# Patient Record
Sex: Male | Born: 1955
Health system: Southern US, Community
[De-identification: ages and names within clinical notes are randomized; demographics above are authoritative.]

## PROBLEM LIST (undated history)

## (undated) DIAGNOSIS — M5410 Radiculopathy, site unspecified: Secondary | ICD-10-CM

## (undated) DIAGNOSIS — E876 Hypokalemia: Secondary | ICD-10-CM

## (undated) DIAGNOSIS — I2510 Atherosclerotic heart disease of native coronary artery without angina pectoris: Secondary | ICD-10-CM

## (undated) DIAGNOSIS — F419 Anxiety disorder, unspecified: Secondary | ICD-10-CM

## (undated) DIAGNOSIS — I499 Cardiac arrhythmia, unspecified: Secondary | ICD-10-CM

## (undated) DIAGNOSIS — I10 Essential (primary) hypertension: Secondary | ICD-10-CM

## (undated) DIAGNOSIS — D333 Benign neoplasm of cranial nerves: Secondary | ICD-10-CM

## (undated) DIAGNOSIS — E7800 Pure hypercholesterolemia, unspecified: Secondary | ICD-10-CM

## (undated) DIAGNOSIS — F22 Delusional disorders: Secondary | ICD-10-CM

## (undated) DIAGNOSIS — M1990 Unspecified osteoarthritis, unspecified site: Secondary | ICD-10-CM

## (undated) DIAGNOSIS — G20A1 Parkinson's disease without dyskinesia, without mention of fluctuations: Secondary | ICD-10-CM

## (undated) DIAGNOSIS — I480 Paroxysmal atrial fibrillation: Secondary | ICD-10-CM

## (undated) DIAGNOSIS — E538 Deficiency of other specified B group vitamins: Secondary | ICD-10-CM

## (undated) DIAGNOSIS — H9192 Unspecified hearing loss, left ear: Secondary | ICD-10-CM

## (undated) DIAGNOSIS — Z978 Presence of other specified devices: Secondary | ICD-10-CM

## (undated) DIAGNOSIS — T8859XA Other complications of anesthesia, initial encounter: Secondary | ICD-10-CM

## (undated) DIAGNOSIS — R251 Tremor, unspecified: Secondary | ICD-10-CM

## (undated) DIAGNOSIS — G4700 Insomnia, unspecified: Secondary | ICD-10-CM

## (undated) DIAGNOSIS — Z9889 Other specified postprocedural states: Secondary | ICD-10-CM

## (undated) HISTORY — DX: Unspecified hearing loss, left ear: H91.92

## (undated) HISTORY — PX: ACOUSTIC NEUROMA RESECTION: SHX5713

## (undated) HISTORY — DX: Paroxysmal atrial fibrillation: I48.0

## (undated) HISTORY — DX: Pure hypercholesterolemia, unspecified: E78.00

## (undated) HISTORY — DX: Hypokalemia: E87.6

## (undated) HISTORY — DX: Essential (primary) hypertension: I10

---
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
## 2013-05-06 ENCOUNTER — Encounter: Payer: Self-pay | Admitting: Cardiovascular Disease

## 2013-05-07 ENCOUNTER — Encounter (INDEPENDENT_AMBULATORY_CARE_PROVIDER_SITE_OTHER): Payer: Self-pay

## 2013-05-07 ENCOUNTER — Encounter: Payer: Self-pay | Admitting: Cardiovascular Disease

## 2013-05-07 ENCOUNTER — Encounter: Payer: Self-pay | Admitting: *Deleted

## 2013-05-07 ENCOUNTER — Other Ambulatory Visit: Payer: Self-pay | Admitting: *Deleted

## 2013-05-07 ENCOUNTER — Ambulatory Visit (INDEPENDENT_AMBULATORY_CARE_PROVIDER_SITE_OTHER): Payer: BC Managed Care – PPO | Admitting: Cardiovascular Disease

## 2013-05-07 VITALS — BP 160/80 | HR 85 | Ht 71.0 in | Wt 222.1 lb

## 2013-05-07 DIAGNOSIS — I10 Essential (primary) hypertension: Secondary | ICD-10-CM

## 2013-05-07 DIAGNOSIS — E876 Hypokalemia: Secondary | ICD-10-CM | POA: Insufficient documentation

## 2013-05-07 DIAGNOSIS — E7800 Pure hypercholesterolemia, unspecified: Secondary | ICD-10-CM | POA: Insufficient documentation

## 2013-05-07 DIAGNOSIS — H9192 Unspecified hearing loss, left ear: Secondary | ICD-10-CM | POA: Insufficient documentation

## 2013-05-07 DIAGNOSIS — I4891 Unspecified atrial fibrillation: Secondary | ICD-10-CM | POA: Insufficient documentation

## 2013-05-07 DIAGNOSIS — Z79899 Other long term (current) drug therapy: Secondary | ICD-10-CM

## 2013-05-07 LAB — BASIC METABOLIC PANEL
BUN: 17 mg/dL (ref 6–23)
CO2: 29 mEq/L (ref 19–32)
Calcium: 9.2 mg/dL (ref 8.4–10.5)
Chloride: 101 mEq/L (ref 96–112)
Creatinine, Ser: 1 mg/dL (ref 0.4–1.5)
GFR: 84.61 mL/min (ref >60.00)
Glucose, Bld: 118 mg/dL — ABNORMAL HIGH (ref 70–99)
Potassium: 3.1 mEq/L — ABNORMAL LOW (ref 3.5–5.1)
Sodium: 138 mEq/L (ref 135–145)

## 2013-05-07 LAB — CBC WITH DIFFERENTIAL/PLATELET
Basophils Absolute: 0 10*3/uL (ref 0.0–0.1)
Basophils Relative: 0.3 % (ref 0.0–3.0)
Eosinophils Absolute: 0.2 10*3/uL (ref 0.0–0.7)
Eosinophils Relative: 3.2 % (ref 0.0–5.0)
HCT: 47 % (ref 39.0–52.0)
Hemoglobin: 16.3 g/dL (ref 13.0–17.0)
Lymphocytes Relative: 42.4 % (ref 12.0–46.0)
Lymphs Abs: 2.2 10*3/uL (ref 0.7–4.0)
MCHC: 34.7 g/dL (ref 30.0–36.0)
MCV: 90.8 fl (ref 78.0–100.0)
Monocytes Absolute: 0.6 10*3/uL (ref 0.1–1.0)
Monocytes Relative: 11 % (ref 3.0–12.0)
Neutro Abs: 2.3 10*3/uL (ref 1.4–7.7)
Neutrophils Relative %: 43.1 % (ref 43.0–77.0)
Platelets: 149 10*3/uL — ABNORMAL LOW (ref 150.0–400.0)
RBC: 5.18 Mil/uL (ref 4.22–5.81)
RDW: 14.4 % (ref 11.5–14.6)
WBC: 5.3 10*3/uL (ref 4.5–10.5)

## 2013-05-07 MED ORDER — APIXABAN 5 MG PO TABS: 5.0000 mg | ORAL_TABLET | Freq: Two times a day (BID) | ORAL | Status: DC

## 2013-05-07 NOTE — Assessment & Plan Note (Signed)
Long discussion with patient and wife regarding diagnosis, natural history and Rx including anticoagulation, rate control and cardioversion  Start Eliquis 5 bid  Check CBC and BMET  Echo to assess chamber sizes  Continue current beta blocker.  F/U 3-4 weeks to discuss [facility]

## 2013-05-07 NOTE — Assessment & Plan Note (Signed)
Cholesterol is at goal.  Continue current dose of statin and diet Rx.  No myalgias or side effects.  F/U  LFT's in 6 months. No results found for this basename: [place]   Labs with [facility] Primary

## 2013-05-07 NOTE — Progress Notes (Signed)
Patient ID: Patrick Gray, male   DOB: 04-06-1955, 58 y.o.   MRN: [id number]   58 yo history of HTN  Found to be in afib by primary  Noted irregular pulse for a couple of months but ECG not done. Patient really does not know he is in it.  Chronic Rx for HTN CHADVASC score only 1.  Has an older brother who has afib and required ablation.  No bleeding issues or contraindications to blood thinner.  Denies chest pain, dyspnea Mild fatigue No syncope.  BP lately poorly controlled Norvasc started yesterday.  Denies excess ETOH        ROS: Denies fever, malais, weight loss, blurry vision, decreased visual acuity, cough, sputum, SOB, hemoptysis, pleuritic pain, palpitaitons, heartburn, abdominal pain, melena, lower extremity edema, claudication, or rash.  All other systems reviewed and negative   General: Affect appropriate Healthy:  appears stated age 13: normal Neck supple with no adenopathy JVP normal no bruits no thyromegaly Lungs clear with no wheezing and good diaphragmatic motion Heart:  S1/S2 no murmur,rub, gallop or click PMI normal Abdomen: benighn, BS positve, no tenderness, no AAA no bruit.  No HSM or HJR Distal pulses intact with no bruits No edema Neuro non-focal Skin warm and dry No muscular weakness  Medications Current Outpatient Prescriptions  Medication Sig Dispense Refill  . amLODipine (NORVASC) 5 MG tablet Take 5 mg by mouth daily.       Marland Kitchen apixaban (ELIQUIS) 5 MG TABS tablet Take 1 tablet (5 mg total) by mouth 2 (two) times daily.  60 tablet  6  . KLOR-CON M20 20 MEQ tablet Take 20 mEq by mouth once.       Marland Kitchen lisinopril-hydrochlorothiazide (PRINZIDE,ZESTORETIC) 20-25 MG per tablet Take 1 tablet by mouth daily.       . metoprolol (LOPRESSOR) 50 MG tablet Take 50 mg by mouth 2 (two) times daily.       . simvastatin (ZOCOR) 40 MG tablet Take 40 mg by mouth daily.        No current facility-administered medications for this visit.     Allergies Penicillins  Family History: No family history on file.  Social History: History   Social History  . Marital Status: Married    Spouse Name: N/A    Number of Children: N/A  . Years of Education: N/A   Occupational History  . Not on file.   Social History Main Topics  . Smoking status: Former Research scientist (life sciences)  . Smokeless tobacco: Not on file  . Alcohol Use: Yes     Comment: Weekly, Beer 2+  . Drug Use: Not on file  . Sexual Activity: Not on file   Other Topics Concern  . Not on file   Social History Narrative  . No narrative on file    Electrocardiogram:  afib nonspecific ST/T wave changes  LAD  QT 392  No change from 03/28/13 at [facility] office   Assessment and Plan

## 2013-05-07 NOTE — Assessment & Plan Note (Signed)
Continue current Rx f/u primary Can increase norvasc if needed or change to cardizem since he has afib

## 2013-05-07 NOTE — Patient Instructions (Signed)
Your physician recommends that you schedule a follow-up appointment in:   [place] has recommended you make the following change in your medication: ADD ELIQUIS  5 MG  1 TAB   TWICE  DAILY Your physician has requested that you have an echocardiogram. Echocardiography is a painless test that uses sound waves to create images of your heart. It provides your doctor with information about the size and shape of your heart and how well your heart's chambers and valves are working. This procedure takes approximately one hour. There are no restrictions for this procedure. Your physician recommends that you return for lab work in:  [place]

## 2013-05-08 ENCOUNTER — Telehealth: Payer: Self-pay | Admitting: *Deleted

## 2013-05-08 NOTE — Telephone Encounter (Signed)
Message copied by Verna Czech on Thu May 08, 2013  3:22 PM ------      Message from: Josue Hector      Created: Wed May 07, 2013  3:56 PM       K is low take another Kdur tablet / day and f/u BMET in 4 weeks ------

## 2013-05-08 NOTE — Telephone Encounter (Signed)
Keep takng 40 /day and f/u bmet in 4 weeks

## 2013-05-08 NOTE — Telephone Encounter (Signed)
Pt returns call & states that he is taking 40 meq of potassium daily already for about a month.  I will forward to Dr. Johnsie Cancel for further review Horton Chin RN

## 2013-05-09 NOTE — Telephone Encounter (Signed)
Pt is aware & will stay on current dosage of 62meq potassium daily Pt states he did have his potassium checked twice already in the past month with pcp at [facility].  2/16  K+ 3.4  3/17  K+  3.5  He has an appt 06/04/13 with Dr. Johnsie Cancel & will have bmp drawn after visit. Horton Chin RN

## 2013-05-15 ENCOUNTER — Telehealth: Payer: Self-pay | Admitting: Cardiovascular Disease

## 2013-05-15 ENCOUNTER — Other Ambulatory Visit: Payer: Self-pay | Admitting: *Deleted

## 2013-05-15 ENCOUNTER — Ambulatory Visit (HOSPITAL_COMMUNITY): Payer: BC Managed Care – PPO | Attending: Internal Medicine | Admitting: Radiology

## 2013-05-15 DIAGNOSIS — E876 Hypokalemia: Secondary | ICD-10-CM

## 2013-05-15 DIAGNOSIS — I4891 Unspecified atrial fibrillation: Secondary | ICD-10-CM | POA: Insufficient documentation

## 2013-05-15 NOTE — Telephone Encounter (Signed)
New message ° ° °Patient returning call back to nurse.  °

## 2013-05-15 NOTE — Progress Notes (Signed)
Echocardiogram performed.  

## 2013-05-15 NOTE — Telephone Encounter (Signed)
PT AWARE OF LAB RESULTS./CY 

## 2013-05-19 ENCOUNTER — Telehealth: Payer: Self-pay | Admitting: *Deleted

## 2013-05-19 NOTE — Telephone Encounter (Signed)
         Beckie Salts ','<More Detail >>       Josue Hector, MD       Sent: Fri May 16, 2013 11:11 PM    To: Richmond Campbell, LPN                   Message     Just mild MR otherwise echo normal        ----- Message -----    From: Richmond Campbell, LPN    Sent: 10/24/8331 2:18 PM    To: Josue Hector, MD                                  PT  AWARE OF  ECHO RESULT S./ Adonis Housekeeper

## 2013-05-30 ENCOUNTER — Telehealth: Payer: Self-pay | Admitting: *Deleted

## 2013-05-30 NOTE — Telephone Encounter (Signed)
Normal EF just mild MR echo ok ----- Message ----- From: Baxter Flattery Sent: 05/29/2013 3:01 PM To: Josue Hector, MD Echo Report  PT  AWARE  OF  ECHO RESULTS./CY

## 2013-06-04 ENCOUNTER — Encounter: Payer: Self-pay | Admitting: Cardiovascular Disease

## 2013-06-04 ENCOUNTER — Encounter: Payer: Self-pay | Admitting: Cardiology

## 2013-06-04 ENCOUNTER — Other Ambulatory Visit: Payer: BC Managed Care – PPO

## 2013-06-04 ENCOUNTER — Ambulatory Visit (INDEPENDENT_AMBULATORY_CARE_PROVIDER_SITE_OTHER): Payer: BC Managed Care – PPO | Admitting: Cardiovascular Disease

## 2013-06-04 VITALS — BP 142/102 | HR 98 | Ht 71.0 in | Wt 222.4 lb

## 2013-06-04 DIAGNOSIS — E7800 Pure hypercholesterolemia, unspecified: Secondary | ICD-10-CM

## 2013-06-04 DIAGNOSIS — E876 Hypokalemia: Secondary | ICD-10-CM

## 2013-06-04 DIAGNOSIS — I10 Essential (primary) hypertension: Secondary | ICD-10-CM

## 2013-06-04 DIAGNOSIS — I4891 Unspecified atrial fibrillation: Secondary | ICD-10-CM

## 2013-06-04 LAB — BASIC METABOLIC PANEL
BUN: 13 mg/dL (ref 6–23)
CO2: 32 mEq/L (ref 19–32)
Calcium: 9.5 mg/dL (ref 8.4–10.5)
Chloride: 102 mEq/L (ref 96–112)
Creatinine, Ser: 0.9 mg/dL (ref 0.4–1.5)
GFR: 98.51 mL/min (ref >60.00)
Glucose, Bld: 97 mg/dL (ref 70–99)
Potassium: 3.5 mEq/L (ref 3.5–5.1)
Sodium: 140 mEq/L (ref 135–145)

## 2013-06-04 NOTE — Assessment & Plan Note (Signed)
On eliquis since 3/18  No missed doses No bleeding issues.  [place] in 2 weeks  Scheduled at [facility] 8:00 am 4/28 Orders written

## 2013-06-04 NOTE — Patient Instructions (Signed)
Your physician has recommended that you have a Cardioversion (DCCV). Electrical Cardioversion uses a jolt of electricity to your heart either through paddles or wired patches attached to your chest. This is a controlled, usually prescheduled, procedure. Defibrillation is done under light anesthesia in the hospital, and you usually go home the day of the procedure. This is done to get your heart back into a normal rhythm. You are not awake for the procedure. Please see the instruction sheet given to you today.  Your physician recommends that you schedule a follow-up appointment in: 3-4 weeks after Cardioversion (CV scheduled for 06/17/13)

## 2013-06-04 NOTE — Assessment & Plan Note (Signed)
Home BP readings are in normal range He would like to stop calcium blocker post [facility] as it makes him lethargic

## 2013-06-04 NOTE — Assessment & Plan Note (Signed)
Cholesterol is at goal.  Continue current dose of statin and diet Rx.  No myalgias or side effects.  F/U  LFT's in 6 months. No results found for this basename: LDLCALC             

## 2013-06-04 NOTE — Progress Notes (Signed)
Patient ID: Patrick Gray, male   DOB: 07/24/1955, 57 y.o.   MRN: [id number] 57 yo history of HTN Found to be in afib 3/18  by primary Noted irregular pulse for a couple of months but ECG not done. Patient really does not know he is in it. Chronic Rx for HTN  CHADVASC score only 1. Has an older brother who has afib and required ablation. No bleeding issues or contraindications to blood thinner. Denies chest pain, dyspnea Mild fatigue  No syncope. BP lately poorly controlled Norvasc started yesterday. Denies excess ETOH Started on Eliquis  F/U Echo   3/26 Study Conclusions  - Left ventricle: The cavity size was normal. Wall thickness was increased in a pattern of mild LVH. Systolic function was normal. The estimated ejection fraction was in the range of 55% to 60%. - Mitral valve: Mild regurgitation. Normla LA size  He is ready for DCC on 4/28 BMET collected today and CBC ok 3/18 (PLT 149 lower limit normal)  Risks and procedural details discussed    ROS: Denies fever, malais, weight loss, blurry vision, decreased visual acuity, cough, sputum, SOB, hemoptysis, pleuritic pain, palpitaitons, heartburn, abdominal pain, melena, lower extremity edema, claudication, or rash.  All other systems reviewed and negative  General: Affect appropriate Healthy:  appears stated age HEENT: normal Neck supple with no adenopathy JVP normal no bruits no thyromegaly Lungs clear with no wheezing and good diaphragmatic motion Heart:  S1/S2 no murmur, no rub, gallop or click PMI normal Abdomen: benighn, BS positve, no tenderness, no AAA no bruit.  No HSM or HJR Distal pulses intact with no bruits No edema Neuro non-focal Skin warm and dry No muscular weakness   Current Outpatient Prescriptions  Medication Sig Dispense Refill  . amLODipine (NORVASC) 5 MG tablet Take 5 mg by mouth daily.       . apixaban (ELIQUIS) 5 MG TABS tablet Take 1 tablet (5 mg total) by mouth 2 (two) times daily.  60 tablet  6  .  KLOR-CON M20 20 MEQ tablet Take 20 mEq by mouth 2 (two) times daily.       . lisinopril-hydrochlorothiazide (PRINZIDE,ZESTORETIC) 20-25 MG per tablet Take 1 tablet by mouth daily.       . metoprolol (LOPRESSOR) 50 MG tablet Take 50 mg by mouth 2 (two) times daily.       . simvastatin (ZOCOR) 40 MG tablet Take 40 mg by mouth daily.        No current facility-administered medications for this visit.    Allergies  Penicillins  Electrocardiogram:  3/18  afib rate 78  otherwwise normal   Assessment and Plan   

## 2013-06-05 NOTE — Progress Notes (Signed)
Quick Note:  Preliminary report reviewed by triage nurse and sent to MD desk. ______ 

## 2013-06-09 ENCOUNTER — Other Ambulatory Visit: Payer: BC Managed Care – PPO

## 2013-06-09 ENCOUNTER — Other Ambulatory Visit: Payer: Self-pay | Admitting: Cardiovascular Disease

## 2013-06-17 ENCOUNTER — Encounter (HOSPITAL_COMMUNITY): Payer: Self-pay

## 2013-06-17 ENCOUNTER — Ambulatory Visit (HOSPITAL_COMMUNITY)
Admission: RE | Admit: 2013-06-17 | Discharge: 2013-06-17 | Disposition: A | Discharge status: Home / Self Care | Payer: BC Managed Care – PPO | Source: Ambulatory Visit | Attending: Cardiovascular Disease | Admitting: Cardiovascular Disease

## 2013-06-17 ENCOUNTER — Ambulatory Visit (HOSPITAL_COMMUNITY): Payer: BC Managed Care – PPO | Admitting: Anesthesiology

## 2013-06-17 ENCOUNTER — Encounter (HOSPITAL_COMMUNITY): Payer: BC Managed Care – PPO | Admitting: Anesthesiology

## 2013-06-17 ENCOUNTER — Encounter (HOSPITAL_COMMUNITY)
Admission: RE | Disposition: A | Discharge status: Home / Self Care | Payer: Self-pay | Source: Ambulatory Visit | Attending: Cardiovascular Disease

## 2013-06-17 DIAGNOSIS — I059 Rheumatic mitral valve disease, unspecified: Secondary | ICD-10-CM | POA: Insufficient documentation

## 2013-06-17 DIAGNOSIS — I4891 Unspecified atrial fibrillation: Secondary | ICD-10-CM

## 2013-06-17 DIAGNOSIS — Z7901 Long term (current) use of anticoagulants: Secondary | ICD-10-CM | POA: Insufficient documentation

## 2013-06-17 DIAGNOSIS — I10 Essential (primary) hypertension: Secondary | ICD-10-CM | POA: Insufficient documentation

## 2013-06-17 DIAGNOSIS — Z87891 Personal history of nicotine dependence: Secondary | ICD-10-CM | POA: Insufficient documentation

## 2013-06-17 HISTORY — PX: CARDIOVERSION: SHX1299

## 2013-06-17 SURGERY — CARDIOVERSION
Anesthesia: Monitor Anesthesia Care

## 2013-06-17 MED ORDER — LIDOCAINE HCL (CARDIAC) 20 MG/ML IV SOLN
INTRAVENOUS | Status: DC | PRN
  Administered 2013-06-17: 40 mg via INTRAVENOUS

## 2013-06-17 MED ORDER — METOPROLOL TARTRATE 1 MG/ML IV SOLN
5.0000 mg | Freq: Once | INTRAVENOUS | Status: AC
  Administered 2013-06-17: 5 mg via INTRAVENOUS

## 2013-06-17 MED ORDER — PROPOFOL 10 MG/ML IV BOLUS
INTRAVENOUS | Status: DC | PRN
  Administered 2013-06-17: 100 mg via INTRAVENOUS

## 2013-06-17 MED ORDER — SODIUM CHLORIDE 0.9 % IV SOLN
INTRAVENOUS | Status: DC
  Administered 2013-06-17: 08:00:00 via INTRAVENOUS

## 2013-06-17 MED ORDER — METOPROLOL TARTRATE 1 MG/ML IV SOLN
INTRAVENOUS | Status: AC
  Filled 2013-06-17: qty 5

## 2013-06-17 NOTE — Anesthesia Preprocedure Evaluation (Addendum)
Anesthesia Evaluation  Patient identified by MRN, date of birth, ID band Patient awake    Reviewed: Allergy & Precautions, H&P , NPO status , Patient's Chart, lab work & pertinent test results, reviewed documented beta blocker date and time   Airway Mallampati: II      Dental  (+) Teeth Intact   Pulmonary former smoker,  breath sounds clear to auscultation        Cardiovascular hypertension, Pt. on home beta blockers + dysrhythmias Atrial Fibrillation Rhythm:Regular Rate:Normal     Neuro/Psych    GI/Hepatic negative GI ROS, Neg liver ROS,   Endo/Other    Renal/GU negative Renal ROS     Musculoskeletal   Abdominal   Peds  Hematology   Anesthesia Other Findings   Reproductive/Obstetrics                          Anesthesia Physical Anesthesia Plan  ASA: III  Anesthesia Plan: MAC and General   Post-op Pain Management:    Induction: Intravenous  Airway Management Planned: Simple Face Mask  Additional Equipment:   Intra-op Plan:   Post-operative Plan:   Informed Consent: I have reviewed the patients History and Physical, chart, labs and discussed the procedure including the risks, benefits and alternatives for the proposed anesthesia with the patient or authorized representative who has indicated his/her understanding and acceptance.   Dental advisory given  Plan Discussed with: CRNA, Anesthesiologist and Surgeon  Anesthesia Plan Comments:         Anesthesia Quick Evaluation

## 2013-06-17 NOTE — Interval H&P Note (Signed)
History and Physical Interval Note:  06/17/2013 7:54 AM  Patrick Gray  has presented today for surgery, with the diagnosis of AFIB  The various methods of treatment have been discussed with the patient and family. After consideration of risks, benefits and other options for treatment, the patient has consented to  Procedure(s): CARDIOVERSION (N/A) as a surgical intervention .  The patient's history has been reviewed, patient examined, no change in status, stable for surgery.  I have reviewed the patient's chart and labs.  Questions were answered to the patient's satisfaction.     Josue Hector

## 2013-06-17 NOTE — CV Procedure (Signed)
[place]:  Pre procedure rhythm afib rate 94  Propofol 80 gm given by Dr Oletta Lamas  Patient NPO and on Rx Eliquis Shock 120 JBiphasic then 200 J Biphasic  Converted to NSR rate 90  BP 154 95  Given 5mg  iv lopressor  Successful DCC on Rx anticoagulation  Josue Hector

## 2013-06-17 NOTE — H&P (View-Only) (Signed)
Patient ID: Patrick Gray, male   DOB: [birth year], 58 y.o.   MRN: [id number] 58 yo history of HTN Found to be in afib 3/18  by primary Noted irregular pulse for a couple of months but ECG not done. Patient really does not know he is in it. Chronic Rx for HTN  CHADVASC score only 1. Has an older brother who has afib and required ablation. No bleeding issues or contraindications to blood thinner. Denies chest pain, dyspnea Mild fatigue  No syncope. BP lately poorly controlled Norvasc started yesterday. Denies excess ETOH Started on Eliquis  F/U Echo   3/26 Study Conclusions  - Left ventricle: The cavity size was normal. Wall thickness was increased in a pattern of mild LVH. Systolic function was normal. The estimated ejection fraction was in the range of 55% to 60%. - Mitral valve: Mild regurgitation. Normla LA size  He is ready for [facility] on 4/28 BMET collected today and CBC ok 3/18 (PLT 149 lower limit normal)  Risks and procedural details discussed    ROS: Denies fever, malais, weight loss, blurry vision, decreased visual acuity, cough, sputum, SOB, hemoptysis, pleuritic pain, palpitaitons, heartburn, abdominal pain, melena, lower extremity edema, claudication, or rash.  All other systems reviewed and negative  General: Affect appropriate Healthy:  appears stated age 58: normal Neck supple with no adenopathy JVP normal no bruits no thyromegaly Lungs clear with no wheezing and good diaphragmatic motion Heart:  S1/S2 no murmur, no rub, gallop or click PMI normal Abdomen: benighn, BS positve, no tenderness, no AAA no bruit.  No HSM or HJR Distal pulses intact with no bruits No edema Neuro non-focal Skin warm and dry No muscular weakness   Current Outpatient Prescriptions  Medication Sig Dispense Refill  . amLODipine (NORVASC) 5 MG tablet Take 5 mg by mouth daily.       Marland Kitchen apixaban (ELIQUIS) 5 MG TABS tablet Take 1 tablet (5 mg total) by mouth 2 (two) times daily.  60 tablet  6  .  KLOR-CON M20 20 MEQ tablet Take 20 mEq by mouth 2 (two) times daily.       Marland Kitchen lisinopril-hydrochlorothiazide (PRINZIDE,ZESTORETIC) 20-25 MG per tablet Take 1 tablet by mouth daily.       . metoprolol (LOPRESSOR) 50 MG tablet Take 50 mg by mouth 2 (two) times daily.       . simvastatin (ZOCOR) 40 MG tablet Take 40 mg by mouth daily.        No current facility-administered medications for this visit.    Allergies  Penicillins  Electrocardiogram:  3/18  afib rate 78  otherwwise normal   Assessment and Plan

## 2013-06-17 NOTE — Transfer of Care (Signed)
Immediate Anesthesia Transfer of Care Note  Patient: Patrick Gray  Procedure(s) Performed: Procedure(s): CARDIOVERSION (N/A)  Patient Location: Endoscopy Unit  Anesthesia Type:MAC  Level of Consciousness: awake, alert  and oriented  Airway & Oxygen Therapy: Patient Spontanous Breathing and Patient connected to nasal cannula oxygen  Post-op Assessment: Report given to PACU RN and Post -op Vital signs reviewed and stable  Post vital signs: Reviewed and stable  Complications: No apparent anesthesia complications

## 2013-06-17 NOTE — Anesthesia Postprocedure Evaluation (Signed)
  Anesthesia Post-op Note  Patient: Patrick Gray  Procedure(s) Performed: Procedure(s): CARDIOVERSION (N/A)  Patient Location: PACU and Endoscopy Unit  Anesthesia Type:MAC  Level of Consciousness: awake  Airway and Oxygen Therapy: Patient Spontanous Breathing  Post-op Pain: mild  Post-op Assessment: Post-op Vital signs reviewed  Post-op Vital Signs: Reviewed  Last Vitals:  Filed Vitals:   06/17/13 0710  BP: 161/106  Pulse: 98  Temp: 36.8 C  Resp: 19    Complications: No apparent anesthesia complications

## 2013-06-17 NOTE — Discharge Instructions (Signed)
Electrical Cardioversion Electrical cardioversion is the delivery of a jolt of electricity to change the rhythm of the heart. Sticky patches or metal paddles are placed on the chest to deliver the electricity from a device. This is done to restore a normal rhythm. A rhythm that is too fast or not regular keeps the heart from pumping well. Electrical cardioversion is done in an emergency if:   There is low or no blood pressure as a result of the heart rhythm.   Normal rhythm must be restored as fast as possible to protect the brain and heart from further damage.   It may save a life. Cardioversion may be done for heart rhythms that are not immediately life-threatening, such as atrial fibrillation or flutter, in which:   The heart is beating too fast or is not regular.   Medicine to change the rhythm has not worked.   It is safe to wait in order to allow time for preparation.  Symptoms of the abnormal rhythm are bothersome.  The risk of stroke and other serious complications can be reduced. LET YOUR CAREGIVER KNOW ABOUT:   All medicines you are taking, including vitamins, herbs, eye drops, creams, and over-the-counter medicines.   Previous problems you or members of your family have had with the use of anesthetics.   Any blood disorders you have.   Previous surgeries you have had.   Medical conditions you have. RISKS AND COMPLICATIONS  Generally, this is a safe procedure. However, as with any procedure, complications can occur. Possible complications include:   Breathing problems related to the anesthetic used.  Cardiac arrest This risk is rare.  A blood clot that breaks free and travels to other parts of your body. This could cause a stroke or other problems. The risk of this is lowered by use of blood thinning medicine (anticoagulant) prior to the procedure. BEFORE THE PROCEDURE   You may have tests to detect blood clots in your heart and evaluate heart  function.  You may start taking anticoagulants so your blood does not clot as easily.   Medicines may be given to help stabilize your heart rate and rhythm. PROCEDURE  You will be given medicine through an IV tube to reduce discomfort and make you sleepy (sedative).   An electrical shock will be delivered. AFTER THE PROCEDURE Your heart rhythm will be watched to make sure it does not change.You may be able to go home within a few hours.  Document Released: 01/27/2002 Document Revised: 11/27/2012 Document Reviewed: 08/21/2012 [facility] Patient Information 2014 [place]. Electrical Cardioversion, Care After Refer to this sheet in the next few weeks. These instructions provide you with information on caring for yourself after your procedure. Your health care provider may also give you more specific instructions. Your treatment has been planned according to current medical practices, but problems sometimes occur. Call your health care provider if you have any problems or questions after your procedure. WHAT TO EXPECT AFTER THE PROCEDURE After your procedure, it is typical to have the following sensations:  Some redness on the skin where the shocks were delivered. If this is tender, a sunburn lotion or hydrocortisone cream may help.  Possible return of an abnormal heart rhythm within hours or days after the procedure. HOME CARE INSTRUCTIONS  Only take medicine as directed by your health care provider. Be sure you understand how and when to take your medicine.  Learn how to feel your pulse and check it often.  Limit your activity  for 48 hours after the procedure or as directed. °· Avoid or minimize caffeine and other stimulants as directed. °SEEK MEDICAL CARE IF: °· You feel like your heart is beating too fast or your pulse is not regular. °· You have any questions about your medicines. °· You have bleeding that will not stop. °SEEK IMMEDIATE MEDICAL CARE IF: °· You are dizzy or feel  faint. °· It is hard to breathe or you feel short of breath. °· There is a change in discomfort in your chest. °· Your speech is slurred or you have trouble moving an arm or leg on one side of your body. °· You get a serious muscle cramp that does not go away. °· Your fingers or toes turn cold or blue. °MAKE SURE YOU:  °· Understand these instructions.   °· Will watch your condition.   °· Will get help right away if you are not doing well or get worse. °Document Released: 11/27/2012 Document Reviewed: 08/21/2012 °[employer]® Patient Information ©2014 [employer]. ° °

## 2013-06-18 ENCOUNTER — Encounter (HOSPITAL_COMMUNITY): Payer: Self-pay | Admitting: Cardiovascular Disease

## 2013-06-23 ENCOUNTER — Telehealth: Payer: Self-pay | Admitting: *Deleted

## 2013-06-23 ENCOUNTER — Encounter: Payer: Self-pay | Admitting: Cardiovascular Disease

## 2013-06-23 NOTE — Telephone Encounter (Signed)
Received message from "My Chart" that since his cardioversion his BP has been running low.  States BP has been ranging 95/65 since the 28th. Wants to know if can decrease his Metoprolol to 25 mg BID from 50 mg BID. Only symptom is some dizziness and feels tired, has not passed out.  Spoke w/Dr. Julianne Handler (DOD) who states he can decrease Metoprolol to 25 mg BID. Advised him to continue monitoring his BP. Has an appointment 5/20 with Dr. Johnsie Cancel. Will call if has any further questions.

## 2013-07-09 ENCOUNTER — Encounter: Payer: Self-pay | Admitting: Cardiovascular Disease

## 2013-07-09 ENCOUNTER — Ambulatory Visit (INDEPENDENT_AMBULATORY_CARE_PROVIDER_SITE_OTHER): Payer: BC Managed Care – PPO | Admitting: Cardiovascular Disease

## 2013-07-09 VITALS — BP 158/100 | HR 83 | Ht 71.0 in | Wt 226.0 lb

## 2013-07-09 DIAGNOSIS — E7800 Pure hypercholesterolemia, unspecified: Secondary | ICD-10-CM

## 2013-07-09 DIAGNOSIS — I10 Essential (primary) hypertension: Secondary | ICD-10-CM

## 2013-07-09 DIAGNOSIS — I4891 Unspecified atrial fibrillation: Secondary | ICD-10-CM

## 2013-07-09 MED ORDER — FLECAINIDE ACETATE 100 MG PO TABS: 100.0000 mg | ORAL_TABLET | Freq: Two times a day (BID) | ORAL | Status: DC

## 2013-07-09 MED ORDER — METOPROLOL TARTRATE 50 MG PO TABS: ORAL_TABLET | ORAL | Status: DC

## 2013-07-09 NOTE — Assessment & Plan Note (Signed)
Well controlled.  Continue current medications and low sodium Dash type diet.   Did not take meds this am

## 2013-07-09 NOTE — Patient Instructions (Addendum)
Your physician recommends that you schedule a follow-up appointment in: NEXT  AVAILABLE WITH  DR [facility] Your physician has recommended you make the following change in your medication: STOP  AMLODIPINE   INCREASE  METOPROLOL TO   50 MG  1  TAB  TWICE  DAILY  START  FLECAINIDE  100 MG   TWICE  DAILY  Your physician has requested that you have an exercise tolerance test. For further information please visit [email or web address]. Please also follow instruction sheet, as given. 7  -10  DAYS

## 2013-07-09 NOTE — Assessment & Plan Note (Addendum)
Cholesterol is at goal.  Continue current dose of statin and diet Rx.  No myalgias or side effects.  F/U  LFT's in 6 months. No results found for this basename: LDLCALC  D/C norvasc since he is on zocor  F/u labs with primary  Offerred to have him change to lipitor since it is also generic

## 2013-07-09 NOTE — Progress Notes (Signed)
Patient ID: Patrick Gray, male   DOB: 1955/11/18, 58 y.o.   MRN: [id number] 58 yo history of HTN Found to be in afib 3/18 by primary Noted irregular pulse for a couple of months but ECG not done. Patient really does not know he is in it. Chronic Rx for HTN  CHADVASC score only 1. Has an older brother who has afib and required ablation. No bleeding issues or contraindications to blood thinner. Denies chest pain, dyspnea Mild fatigue  No syncope. BP lately poorly controlled Norvasc started yesterday. Denies excess ETOH Started on Eliquis F/U Echo  3/26  Study Conclusions  - Left ventricle: The cavity size was normal. Wall thickness was increased in a pattern of mild LVH. Systolic function was normal. The estimated ejection fraction was in the range of 55% to 60%. - Mitral valve: Mild regurgitation.  Normla LA size   DCC done 4/28  120 and 200 J converted to NSR   However he is in afib today and is unaware of rhythm  Discussed alternatives of rate control anticoagulation, antiarrhythmic Rx or ablation I do not feel he is symptomatic enough for ablation  Discussed antiarrythmic choice with Dr Patrick Gray and he prefers flecainide at dose of 100 bid over [place].  Risk of proarrhythmia discussed As well as need for ETT   Also noted on norvasc and zocor which has adverse interaction discussed increasing beta blocker and stopping norvasc vs changing to lipitor and he prefers the former    ROS: Denies fever, malais, weight loss, blurry vision, decreased visual acuity, cough, sputum, SOB, hemoptysis, pleuritic pain, palpitaitons, heartburn, abdominal pain, melena, lower extremity edema, claudication, or rash.  All other systems reviewed and negative  General: Affect appropriate Obese [name] male  HEENT: normal Neck supple with no adenopathy JVP normal no bruits no thyromegaly Lungs clear with no wheezing and good diaphragmatic motion Heart:  S1/S2 no murmur, no rub, gallop or click PMI normal Abdomen:  benighn, BS positve, no tenderness, no AAA no bruit.  No HSM or HJR Distal pulses intact with no bruits Plus one  Edema S/P bilateral knee replacements  Neuro non-focal Skin warm and dry No muscular weakness   Current Outpatient Prescriptions  Medication Sig Dispense Refill  . amLODipine (NORVASC) 5 MG tablet Take 5 mg by mouth daily.       Marland Kitchen apixaban (ELIQUIS) 5 MG TABS tablet Take 1 tablet (5 mg total) by mouth 2 (two) times daily.  60 tablet  6  . KLOR-CON M20 20 MEQ tablet Take 20 mEq by mouth 2 (two) times daily.       Marland Kitchen lisinopril-hydrochlorothiazide (PRINZIDE,ZESTORETIC) 20-25 MG per tablet Take 1 tablet by mouth daily.       . metoprolol (LOPRESSOR) 50 MG tablet Take 1/2 tablet (25 mg) twice a day      . simvastatin (ZOCOR) 40 MG tablet Take 40 mg by mouth daily.        No current facility-administered medications for this visit.    Allergies  Penicillins  Electrocardiogram:  afib rate 78 LAD nospecific ST/T wave changes   3/18  AFib rate 78 nonspecific ST changes similar   Assessment and Plan

## 2013-07-09 NOTE — Assessment & Plan Note (Addendum)
Start flecainide 100 bid with normal EF and no CAD  Risk of proarrhythmia discussed ETT in 7-14 days  Continue eliquis  Will see next available and plan repeat [facility] on Rx

## 2013-07-11 ENCOUNTER — Telehealth (HOSPITAL_COMMUNITY): Payer: Self-pay

## 2013-07-15 ENCOUNTER — Ambulatory Visit (HOSPITAL_COMMUNITY)
Admission: RE | Admit: 2013-07-15 | Discharge: 2013-07-15 | Disposition: A | Discharge status: Home / Self Care | Payer: BC Managed Care – PPO | Source: Ambulatory Visit | Attending: Cardiovascular Disease | Admitting: Cardiovascular Disease

## 2013-07-15 DIAGNOSIS — I4891 Unspecified atrial fibrillation: Secondary | ICD-10-CM | POA: Insufficient documentation

## 2013-07-23 ENCOUNTER — Telehealth: Payer: Self-pay | Admitting: *Deleted

## 2013-07-23 NOTE — Telephone Encounter (Signed)
Beckie Salts ','<More Detail >>       Josue Hector, MD       Sent: Thu Jul 17, 2013 4:57 PM    To: Richmond Campbell, LPN                   Message     Normal ETT        ----- Message -----    From: Kendra Opitz Moffitt    Sent: 07/17/2013 7:45 AM    To: Josue Hector, MD                                  Results Exercise Tolerance Test (Order [id number])      Exercise Tolerance Test   Status: Final result Visible to patient: This result is viewable by the patient in [place]. Next appt: 08/15/2013 at 08:00 AM in Cardiology Jenkins Rouge, MD)         Specimen Collected: 07/15/13 11:51 AM Last Resulted: 07/16/13 5:53 PM          Linked Documents     View Image               Result Information     Status     Final result (07/16/2013 5:53 PM)     Provider Status: Ordered    Encounter      View Encounter                Lab Information     MUSE               Order-Level Documents:     There are no order-level documents.         Exercise Tolerance Test (Order [id number]) Released By: Auto-released Date: 07/16/2013  ECG Authorizing: Provider Default, MD Department: [place] [facility] Leslie Dales  Order: [id number]         Provider Default, MD  NPI:         Order Information     Order Date/Time Release Date/Time Start Date/Time End Date/Time    07/15/13 11:51 AM 07/15/13 11:51 AM 07/15/13 11:51 AM 07/15/13 11:51 AM           Order Details     Frequency Duration Priority Order Class    Once 1 occurrence Routine Normal           Comments     Ordered by Jenkins Rouge           Order History Inpatient     Date/Time Action Taken User Additional Information    07/15/13 1151 Release  From Order: [id number]    07/16/13 1753 Result Lab in [place] Interface Final           Collection Information     Collected: 07/15/2013 11:51 AM Resulting Agency: [place]          Patient Information     Patient Name Sex DOB SSN    Unknown, Schleyer Male [birth year] [id number]           Additional Information     Associated Reports    View Parent Encounter    Priority and Order Details    Collection Information.           Order Company secretary  Lab in [place] Provider Provider Default, MD -- -- --            Encounter     View Encounter            Order-Level Documents:     There are no order-level documents.         LMTCB .Adonis Housekeeper

## 2013-07-23 NOTE — Telephone Encounter (Signed)
PT  AWARE OF  GTT RESULTS./CY

## 2013-07-23 NOTE — Telephone Encounter (Signed)
Follow up      Patient call stating nurse call him today- returning call back

## 2013-08-15 ENCOUNTER — Ambulatory Visit (INDEPENDENT_AMBULATORY_CARE_PROVIDER_SITE_OTHER): Payer: BC Managed Care – PPO | Admitting: Cardiovascular Disease

## 2013-08-15 VITALS — BP 158/100 | HR 80 | Ht 71.0 in | Wt 225.0 lb

## 2013-08-15 DIAGNOSIS — I4891 Unspecified atrial fibrillation: Secondary | ICD-10-CM

## 2013-08-15 DIAGNOSIS — I480 Paroxysmal atrial fibrillation: Secondary | ICD-10-CM

## 2013-08-15 NOTE — Patient Instructions (Signed)
Your physician recommends that you schedule a follow-up appointment in:  3 MONTHS WITH  DR NISHAN  Your physician recommends that you continue on your current medications as directed. Please refer to the Current Medication list given to you today.  

## 2013-08-15 NOTE — Assessment & Plan Note (Signed)
Maint NSR continue eliquis and flecainide 100 bid

## 2013-08-15 NOTE — Assessment & Plan Note (Signed)
Cholesterol is at goal.  Continue current dose of statin and diet Rx.  No myalgias or side effects.  F/U  LFT's in 6 months. No results found for this basename: LDLCALC             

## 2013-08-15 NOTE — Progress Notes (Signed)
Patient ID: Patrick Gray, male   DOB: [birth year], 58 y.o.   MRN: [id number] 58 yo history of HTN Found to be in afib 3/18 by primary Noted irregular pulse for a couple of months but ECG not done. Patient really does not know he is in it. Chronic Rx for HTN  CHADVASC score only 1. Has an older brother who has afib and required ablation. No bleeding issues or contraindications to blood thinner. Denies chest pain, dyspnea Mild fatigue  No syncope. BP lately poorly controlled Norvasc started yesterday. Denies excess ETOH Started on Eliquis F/U Echo  3/26  Study Conclusions  - Left ventricle: The cavity size was normal. Wall thickness was increased in a pattern of mild LVH. Systolic function was normal. The estimated ejection fraction was in the range of 55% to 60%. - Mitral valve: Mild regurgitation.  Normla LA size  DCC done 4/28 120 and 200 J converted to NSR However he is in afib today and is unaware of rhythm   Started on flecainide last visit   ETT:  5/15  afib negative for ischemia or pro arrhythmia  Seems to be maintaining NSR on flecainide   BP runs fine at home    ROS: Denies fever, malais, weight loss, blurry vision, decreased visual acuity, cough, sputum, SOB, hemoptysis, pleuritic pain, palpitaitons, heartburn, abdominal pain, melena, lower extremity edema, claudication, or rash.  All other systems reviewed and negative  General: Affect appropriate Healthy:  appears stated age 39: normal Neck supple with no adenopathy JVP normal no bruits no thyromegaly Lungs clear with no wheezing and good diaphragmatic motion Heart:  S1/S2 no murmur, no rub, gallop or click PMI normal Abdomen: benighn, BS positve, no tenderness, no AAA no bruit.  No HSM or HJR Distal pulses intact with no bruits No edema Neuro non-focal Skin warm and dry No muscular weakness   Current Outpatient Prescriptions  Medication Sig Dispense Refill  . amLODipine (NORVASC) 5 MG tablet Take 2.5 mg by mouth  2 (two) times daily.      Marland Kitchen apixaban (ELIQUIS) 5 MG TABS tablet Take 1 tablet (5 mg total) by mouth 2 (two) times daily.  60 tablet  6  . flecainide (TAMBOCOR) 100 MG tablet Take 1 tablet (100 mg total) by mouth 2 (two) times daily.  180 tablet  3  . KLOR-CON M20 20 MEQ tablet Take 20 mEq by mouth 2 (two) times daily.       Marland Kitchen lisinopril-hydrochlorothiazide (PRINZIDE,ZESTORETIC) 20-25 MG per tablet Take 1 tablet by mouth daily.       . metoprolol (LOPRESSOR) 50 MG tablet 1 TAB  TWICE  DAILY  180 tablet  3  . simvastatin (ZOCOR) 40 MG tablet Take 40 mg by mouth daily.        No current facility-administered medications for this visit.    Allergies  Penicillins  Electrocardiogram:  SR rate 72 PR 222 QT 452  Assessment and Plan

## 2013-08-15 NOTE — Assessment & Plan Note (Signed)
Did not take meds this am  Monitor at home increase lisinopril to 40/25

## 2013-11-14 ENCOUNTER — Encounter: Payer: Self-pay | Admitting: Cardiovascular Disease

## 2013-11-14 ENCOUNTER — Ambulatory Visit (INDEPENDENT_AMBULATORY_CARE_PROVIDER_SITE_OTHER): Payer: BC Managed Care – PPO | Admitting: Cardiovascular Disease

## 2013-11-14 VITALS — BP 135/84 | HR 65 | Ht 71.0 in | Wt 228.8 lb

## 2013-11-14 DIAGNOSIS — E7800 Pure hypercholesterolemia, unspecified: Secondary | ICD-10-CM

## 2013-11-14 DIAGNOSIS — I10 Essential (primary) hypertension: Secondary | ICD-10-CM

## 2013-11-14 DIAGNOSIS — I480 Paroxysmal atrial fibrillation: Secondary | ICD-10-CM

## 2013-11-14 DIAGNOSIS — I4891 Unspecified atrial fibrillation: Secondary | ICD-10-CM

## 2013-11-14 NOTE — Assessment & Plan Note (Signed)
Maint NSR cuontinue flecainide

## 2013-11-14 NOTE — Patient Instructions (Signed)
Your physician wants you to follow-up in:  6 MONTHS WITH DR NISHAN  You will receive a reminder letter in the mail two months in advance. If you don't receive a letter, please call our office to schedule the follow-up appointment. Your physician recommends that you continue on your current medications as directed. Please refer to the Current Medication list given to you today. 

## 2013-11-14 NOTE — Assessment & Plan Note (Signed)
Cholesterol is at goal.  Continue current dose of statin and diet Rx.  No myalgias or side effects.  F/U  LFT's in 6 months. No results found for this basename: LDLCALC  Labs with primary            

## 2013-11-14 NOTE — Assessment & Plan Note (Signed)
Well controlled.  Continue current medications and low sodium Dash type diet.   Home readings are fine did show him how to spread meds out during day rather Than taking most of them at night

## 2013-11-14 NOTE — Progress Notes (Signed)
Patient ID: Patrick Gray, male   DOB: Jul 15, 1955, 58 y.o.   MRN: [id number] 58 yo history of HTN Found to be in afib 3/18 by primary Noted irregular pulse for a couple of months but ECG not done. Patient really does not know he is in it. Chronic Rx for HTN  CHADVASC score only 1. Has an older brother who has afib and required ablation. No bleeding issues or contraindications to blood thinner. Denies chest pain, dyspnea Mild fatigue  No syncope. BP lately poorly controlled Norvasc started yesterday. Denies excess ETOH Started on Eliquis F/U Echo  3/26  Study Conclusions  - Left ventricle: The cavity size was normal. Wall thickness was increased in a pattern of mild LVH. Systolic function was normal. The estimated ejection fraction was in the range of 55% to 60%. - Mitral valve: Mild regurgitation.  Normla LA size   DCC done 4/28 120 and 200 J converted to NSR   ETT: 5/15 afib negative for ischemia or pro arrhythmia   Seems to be maintaining NSR on flecainide BP runs fine at home       ROS: Denies fever, malais, weight loss, blurry vision, decreased visual acuity, cough, sputum, SOB, hemoptysis, pleuritic pain, palpitaitons, heartburn, abdominal pain, melena, lower extremity edema, claudication, or rash.  All other systems reviewed and negative  General: Affect appropriate Healthy:  appears stated age 58: normal Neck supple with no adenopathy JVP normal no bruits no thyromegaly Lungs clear with no wheezing and good diaphragmatic motion Heart:  S1/S2 no murmur, no rub, gallop or click PMI normal Abdomen: benighn, BS positve, no tenderness, no AAA no bruit.  No HSM or HJR Distal pulses intact with no bruits No edema Neuro non-focal Skin warm and dry No muscular weakness   Current Outpatient Prescriptions  Medication Sig Dispense Refill  . amLODipine (NORVASC) 5 MG tablet Take 5 mg by mouth 2 (two) times daily.       Marland Kitchen apixaban (ELIQUIS) 5 MG TABS tablet Take 1 tablet (5 mg  total) by mouth 2 (two) times daily.  60 tablet  6  . atorvastatin (LIPITOR) 20 MG tablet Take 20 mg by mouth daily.      . flecainide (TAMBOCOR) 100 MG tablet Take 1 tablet (100 mg total) by mouth 2 (two) times daily.  180 tablet  3  . KLOR-CON M20 20 MEQ tablet Take 20 mEq by mouth 2 (two) times daily.       Marland Kitchen lisinopril-hydrochlorothiazide (PRINZIDE,ZESTORETIC) 20-25 MG per tablet Take 1 tablet by mouth daily.       . metoprolol (LOPRESSOR) 50 MG tablet 1 TAB  TWICE  DAILY  180 tablet  3  . simvastatin (ZOCOR) 40 MG tablet Take 40 mg by mouth daily.        No current facility-administered medications for this visit.    Allergies  Penicillins  Electrocardiogram:  SR rate 72 PR 222 nonspecfic ST / Twave change s  Assessment and Plan       ROS: Denies fever, malais, weight loss, blurry vision, decreased visual acuity, cough, sputum, SOB, hemoptysis, pleuritic pain, palpitaitons, heartburn, abdominal pain, melena, lower extremity edema, claudication, or rash.  All other systems reviewed and negative  General: Affect appropriate Healthy:  appears stated age 58: normal Neck supple with no adenopathy JVP normal no bruits no thyromegaly Lungs clear with no wheezing and good diaphragmatic motion Heart:  S1/S2 no murmur, no rub, gallop or click PMI normal Abdomen: benighn, BS positve, no tenderness,  no AAA no bruit.  No HSM or HJR Distal pulses intact with no bruits No edema Neuro non-focal Skin warm and dry No muscular weakness   Current Outpatient Prescriptions  Medication Sig Dispense Refill  . amLODipine (NORVASC) 5 MG tablet Take 5 mg by mouth 2 (two) times daily.       Marland Kitchen apixaban (ELIQUIS) 5 MG TABS tablet Take 1 tablet (5 mg total) by mouth 2 (two) times daily.  60 tablet  6  . atorvastatin (LIPITOR) 20 MG tablet Take 20 mg by mouth daily.      . flecainide (TAMBOCOR) 100 MG tablet Take 1 tablet (100 mg total) by mouth 2 (two) times daily.  180 tablet  3  .  KLOR-CON M20 20 MEQ tablet Take 20 mEq by mouth 2 (two) times daily.       Marland Kitchen lisinopril-hydrochlorothiazide (PRINZIDE,ZESTORETIC) 20-25 MG per tablet Take 1 tablet by mouth daily.       . metoprolol (LOPRESSOR) 50 MG tablet 1 TAB  TWICE  DAILY  180 tablet  3  . simvastatin (ZOCOR) 40 MG tablet Take 40 mg by mouth daily.        No current facility-administered medications for this visit.    Allergies  Penicillins  Electrocardiogram:  SR rate 72  PR 222  Nonspecific ST/T wave changes   Assessment and Plan

## 2013-12-15 ENCOUNTER — Other Ambulatory Visit: Payer: Self-pay | Admitting: Cardiovascular Disease

## 2014-06-14 ENCOUNTER — Other Ambulatory Visit: Payer: Self-pay | Admitting: Cardiovascular Disease

## 2014-06-24 ENCOUNTER — Other Ambulatory Visit: Payer: Self-pay | Admitting: Cardiovascular Disease

## 2014-08-04 NOTE — Progress Notes (Signed)
Patient ID: Patrick Gray, male   DOB: 1956/02/17, 59 y.o.   MRN: [id number] 59 y.o. history of HTN Found to be in afib 3/18 by primary Noted irregular pulse for a couple of months but ECG not done. Patient really does not know he is in it. Chronic Rx for HTN CHADVASC score only 1. Has an older brother who has afib and required ablation. No bleeding issues or contraindications to blood thinner. Denies chest pain, dyspnea Mild fatigue No syncope. BP lately poorly controlled Norvasc started yesterday. Denies excess ETOH Started on Eliquis F/U Echo   3/26 /15  Reviewed   Study Conclusions  - Left ventricle: The cavity size was normal. Wall thickness was increased in a pattern of mild LVH. Systolic function was normal. The estimated ejection fraction was in the range of 55% to 60%. - Mitral valve: Mild regurgitation.  Normla LA size   [name] done 06/17/13   120 and 200 J converted to NSR   ETT: 07/04/13  afib negative for ischemia or pro arrhythmia  reviewed  Seems to be maintaining NSR on flecainide BP runs fine at home  Very infrequent palpitations    ROS: Denies fever, malais, weight loss, blurry vision, decreased visual acuity, cough, sputum, SOB, hemoptysis, pleuritic pain, palpitaitons, heartburn, abdominal pain, melena, lower extremity edema, claudication, or rash.  All other systems reviewed and negative  General: Affect appropriate Healthy:  appears stated age 59: normal Neck supple with no adenopathy JVP normal no bruits no thyromegaly Lungs clear with no wheezing and good diaphragmatic motion Heart:  S1/S2 no murmur, no rub, gallop or click PMI normal Abdomen: benighn, BS positve, no tenderness, no AAA no bruit.  No HSM or HJR Distal pulses intact with no bruits No edema Neuro non-focal Skin warm and dry No muscular weakness   Current Outpatient Prescriptions  Medication Sig Dispense Refill  . amLODipine (NORVASC) 5 MG tablet Take 5 mg by mouth daily.     Marland Kitchen ELIQUIS 5 MG  TABS tablet TAKE ONE TABLET BY MOUTH TWICE DAILY 60 tablet 1  . flecainide (TAMBOCOR) 100 MG tablet TAKE ONE TABLET BY MOUTH TWICE DAILY 180 tablet 0  . KLOR-CON M20 20 MEQ tablet Take 20 mEq by mouth 2 (two) times daily.     Marland Kitchen lisinopril-hydrochlorothiazide (PRINZIDE,ZESTORETIC) 20-25 MG per tablet Take 1 tablet by mouth daily.     . metoprolol (LOPRESSOR) 50 MG tablet 1 TAB  TWICE  DAILY 180 tablet 3  . atorvastatin (LIPITOR) 20 MG tablet Take 20 mg by mouth daily.     No current facility-administered medications for this visit.    Allergies  Penicillins  Electrocardiogram:   08/15/13  SR rate 72 PR 222 nonspecfic ST / Twave changes  Assessment and Plan PAF: maint NSR on flecainide    HTN: Well controlled.  Continue current medications and low sodium Dash type diet.    Chol:  Cholesterol is at goal.  Continue current dose of statin and diet Rx.  No myalgias or side effects.  F/U  LFT's in 6 months. No results found for: [facility]          F/U with me in a year    Patrick Gray

## 2014-08-06 ENCOUNTER — Encounter: Payer: Self-pay | Admitting: *Deleted

## 2014-08-10 ENCOUNTER — Encounter: Payer: Self-pay | Admitting: Cardiovascular Disease

## 2014-08-10 ENCOUNTER — Ambulatory Visit (INDEPENDENT_AMBULATORY_CARE_PROVIDER_SITE_OTHER): Payer: BC Managed Care – PPO | Admitting: Cardiovascular Disease

## 2014-08-10 VITALS — BP 138/94 | HR 57 | Ht 71.0 in | Wt 229.8 lb

## 2014-08-10 DIAGNOSIS — I481 Persistent atrial fibrillation: Secondary | ICD-10-CM

## 2014-08-10 DIAGNOSIS — I4819 Other persistent atrial fibrillation: Secondary | ICD-10-CM

## 2014-08-10 NOTE — Patient Instructions (Signed)
Your physician wants you to follow-up in: YEAR WITH DR NISHAN  You will receive a reminder letter in the mail two months in advance. If you don't receive a letter, please call our office to schedule the follow-up appointment.  Your physician recommends that you continue on your current medications as directed. Please refer to the Current Medication list given to you today. 

## 2014-08-13 ENCOUNTER — Other Ambulatory Visit: Payer: Self-pay | Admitting: Cardiovascular Disease

## 2014-08-14 ENCOUNTER — Other Ambulatory Visit: Payer: Self-pay

## 2014-08-14 MED ORDER — APIXABAN 5 MG PO TABS: 5.0000 mg | ORAL_TABLET | Freq: Two times a day (BID) | ORAL | Status: DC

## 2014-09-28 ENCOUNTER — Other Ambulatory Visit: Payer: Self-pay | Admitting: Cardiovascular Disease

## 2015-03-14 ENCOUNTER — Other Ambulatory Visit: Payer: Self-pay | Admitting: Cardiovascular Disease

## 2015-03-15 ENCOUNTER — Other Ambulatory Visit: Payer: Self-pay | Admitting: *Deleted

## 2015-03-15 MED ORDER — APIXABAN 5 MG PO TABS: 5.0000 mg | ORAL_TABLET | Freq: Two times a day (BID) | ORAL | Status: DC

## 2015-03-25 ENCOUNTER — Other Ambulatory Visit: Payer: Self-pay | Admitting: Cardiovascular Disease

## 2015-06-22 ENCOUNTER — Other Ambulatory Visit: Payer: Self-pay | Admitting: Cardiovascular Disease

## 2015-08-10 NOTE — Progress Notes (Signed)
Patient ID: Patrick Gray, male   DOB: 1955-04-07, 60 y.o.   MRN: [id number] 60 y.o. history of HTN Found to be in afib 3/18 by primary Noted irregular pulse for a couple of months but ECG not done. Patient really does not know he is in it. Chronic Rx for HTN CHADVASC score only 1. Has an older brother who has afib and required ablation. No bleeding issues or contraindications to blood thinner. Denies chest pain, dyspnea Mild fatigue No syncope. BP lately poorly controlled Norvasc started yesterday. Denies excess ETOH Started on Eliquis F/U Echo   3/26 /15  Reviewed   Study Conclusions  - Left ventricle: The cavity size was normal. Wall thickness was increased in a pattern of mild LVH. Systolic function was normal. The estimated ejection fraction was in the range of 55% to 60%. - Mitral valve: Mild regurgitation.  Normla LA size   [place] done 06/17/13   120 and 200 J converted to NSR   ETT: 07/04/13  afib negative for ischemia or pro arrhythmia  reviewed  Back in aflutter today. Worse since December had noro virus which seemed To set things off.   Discussed options with him Increase flecainide Change antiarrhythmic Consider ablation  He prefers to increase flecainide and f/u with EP He knows when he is in flutter/fib and does not want The added expense of event monitor   ROS: Denies fever, malais, weight loss, blurry vision, decreased visual acuity, cough, sputum, SOB, hemoptysis, pleuritic pain, palpitaitons, heartburn, abdominal pain, melena, lower extremity edema, claudication, or rash.  All other systems reviewed and negative  General: Affect appropriate Healthy:  appears stated age 23: normal Neck supple with no adenopathy JVP normal no bruits no thyromegaly Lungs clear with no wheezing and good diaphragmatic motion Heart:  S1/S2 no murmur, no rub, gallop or click PMI normal Abdomen: benighn, BS positve, no tenderness, no AAA no bruit.  No HSM or HJR Distal pulses intact  with no bruits No edema Neuro non-focal Skin warm and dry No muscular weakness   Current Outpatient Prescriptions  Medication Sig Dispense Refill  . amLODipine (NORVASC) 5 MG tablet Take 5 mg by mouth daily.     Marland Kitchen apixaban (ELIQUIS) 5 MG TABS tablet Take 1 tablet (5 mg total) by mouth 2 (two) times daily. 60 tablet 6  . atorvastatin (LIPITOR) 20 MG tablet Take 20 mg by mouth daily.    . flecainide (TAMBOCOR) 150 MG tablet Take 1 tablet (150 mg total) by mouth 2 (two) times daily. 180 tablet 3  . KLOR-CON M20 20 MEQ tablet Take 20 mEq by mouth 2 (two) times daily.     Marland Kitchen lisinopril-hydrochlorothiazide (PRINZIDE,ZESTORETIC) 20-25 MG per tablet Take 1 tablet by mouth daily.     . metoprolol (LOPRESSOR) 50 MG tablet 1 TAB  TWICE  DAILY 180 tablet 3   No current facility-administered medications for this visit.    Allergies  Penicillins  Electrocardiogram:   08/15/13  SR rate 72 PR 222 nonspecfic ST / Twave changes  Assessment and Plan PAF: back in flutter increase flecainide f/u EP/afib clinic and consider further alternatives Including alternative AAT or ablation. May need f/u ETT on higher dose flecainide can  Be arranged on EP visit if deemed necessary  HTN: Well controlled.  Continue current medications and low sodium Dash type diet.    Chol:  Cholesterol is at goal.  Continue current dose of statin and diet Rx.  No myalgias or side effects.  F/U  LFT's in 6 months. No results found for: [place]          F/U with me in 3 months    Jenkins Rouge

## 2015-08-11 ENCOUNTER — Encounter: Payer: Self-pay | Admitting: Cardiovascular Disease

## 2015-08-11 ENCOUNTER — Ambulatory Visit (INDEPENDENT_AMBULATORY_CARE_PROVIDER_SITE_OTHER): Payer: BC Managed Care – PPO | Admitting: Cardiovascular Disease

## 2015-08-11 VITALS — BP 150/90 | HR 63 | Ht 71.0 in | Wt 223.4 lb

## 2015-08-11 DIAGNOSIS — I481 Persistent atrial fibrillation: Secondary | ICD-10-CM | POA: Diagnosis not present

## 2015-08-11 DIAGNOSIS — I4819 Other persistent atrial fibrillation: Secondary | ICD-10-CM

## 2015-08-11 MED ORDER — FLECAINIDE ACETATE 150 MG PO TABS: 150.0000 mg | ORAL_TABLET | Freq: Two times a day (BID) | ORAL | Status: DC

## 2015-08-11 NOTE — Patient Instructions (Addendum)
Medication Instructions:  Your physician has recommended you make the following change in your medication:  1. Increase Flecainide 150 mg by mouth twice daily  Labwork: NONE  Testing/Procedures: NONE  Follow-Up: Your physician wants you to follow-up in: 3 months with Dr. Johnsie Cancel.   Your physician recommends that you schedule a follow-up appointment in: As soon as possible with EP or [facility] to discuss possible Ablation   If you need a refill on your cardiac medications before your next appointment, please call your pharmacy.

## 2015-08-16 ENCOUNTER — Ambulatory Visit (HOSPITAL_COMMUNITY)
Admission: RE | Admit: 2015-08-16 | Discharge: 2015-08-16 | Disposition: A | Discharge status: Home / Self Care | Payer: BC Managed Care – PPO | Source: Ambulatory Visit | Attending: Nurse Practitioner | Admitting: Nurse Practitioner

## 2015-08-16 ENCOUNTER — Encounter (HOSPITAL_COMMUNITY): Payer: Self-pay | Admitting: Nurse Practitioner

## 2015-08-16 VITALS — BP 140/86 | HR 55 | Ht 71.0 in | Wt 223.0 lb

## 2015-08-16 DIAGNOSIS — Z9049 Acquired absence of other specified parts of digestive tract: Secondary | ICD-10-CM | POA: Insufficient documentation

## 2015-08-16 DIAGNOSIS — Z880 Allergy status to penicillin: Secondary | ICD-10-CM | POA: Diagnosis not present

## 2015-08-16 DIAGNOSIS — H9192 Unspecified hearing loss, left ear: Secondary | ICD-10-CM | POA: Insufficient documentation

## 2015-08-16 DIAGNOSIS — Z7901 Long term (current) use of anticoagulants: Secondary | ICD-10-CM | POA: Insufficient documentation

## 2015-08-16 DIAGNOSIS — I440 Atrioventricular block, first degree: Secondary | ICD-10-CM | POA: Diagnosis not present

## 2015-08-16 DIAGNOSIS — I10 Essential (primary) hypertension: Secondary | ICD-10-CM | POA: Diagnosis not present

## 2015-08-16 DIAGNOSIS — I481 Persistent atrial fibrillation: Secondary | ICD-10-CM | POA: Diagnosis not present

## 2015-08-16 DIAGNOSIS — E876 Hypokalemia: Secondary | ICD-10-CM | POA: Diagnosis not present

## 2015-08-16 DIAGNOSIS — R001 Bradycardia, unspecified: Secondary | ICD-10-CM | POA: Diagnosis not present

## 2015-08-16 DIAGNOSIS — I480 Paroxysmal atrial fibrillation: Secondary | ICD-10-CM | POA: Insufficient documentation

## 2015-08-16 DIAGNOSIS — I4819 Other persistent atrial fibrillation: Secondary | ICD-10-CM

## 2015-08-16 DIAGNOSIS — E7800 Pure hypercholesterolemia, unspecified: Secondary | ICD-10-CM | POA: Diagnosis not present

## 2015-08-16 LAB — BASIC METABOLIC PANEL
Anion gap: 9 (ref 5–15)
BUN: 13 mg/dL (ref 6–20)
CO2: 28 mmol/L (ref 22–32)
Calcium: 9.6 mg/dL (ref 8.9–10.3)
Chloride: 103 mmol/L (ref 101–111)
Creatinine, Ser: 0.92 mg/dL (ref 0.61–1.24)
GFR calc Af Amer: >60 mL/min (ref >60)
GFR calc non Af Amer: >60 mL/min (ref >60)
Glucose, Bld: 98 mg/dL (ref 65–99)
Potassium: 3.6 mmol/L (ref 3.5–5.1)
Sodium: 140 mmol/L (ref 135–145)

## 2015-08-16 LAB — MAGNESIUM: Magnesium: 2.3 mg/dL (ref 1.7–2.4)

## 2015-08-16 NOTE — Patient Instructions (Signed)
Scheduler will call regarding sleep study after insurance approval

## 2015-08-16 NOTE — Progress Notes (Signed)
Patient ID: Patrick Gray, male   DOB: 1955-10-08, 60 y.o.   MRN: [id number]     Primary Care Physician: Gara Kroner, MD Referring Physician: Kamori Orebaugh is a 60 y.o. male with a h/o afib 2015 found by primary. Was placed on flecainide 100 mg bid and has done well until seen 6/20 and was noted to be in aflutter with RVR. Has noted some increase in irregular heart beat over the last few weeks. Flecainide was increased to 150 mg bid by Dr. Johnsie Cancel, 6/20, and asked to f/u here. EKG now shows SR, but he feels  fatigue. Wife states he does snore, has daytime somnolence and he drinks at least two beers a night. Minimal caffeine, no smoking. Exercises at the gym and has lost some weight but would like to lose 20 lbs more. Chadsvasc score is 1 for HTN, has been on blood thinner for 2 years, compliant without bleeding issues.Has an older brother and mother with afib, brother has had ablation .  Today, he denies symptoms of palpitations, chest pain, shortness of breath, orthopnea, PND, lower extremity edema, dizziness, presyncope, syncope, or neurologic sequela. The patient is tolerating medications without difficulties and is otherwise without complaint today.   Past Medical History  Diagnosis Date  . HTN (hypertension)   . Hypercholesterolemia   . Deafness in left ear   . Atrial fibrillation ([place])   . Hypopotassemia    Past Surgical History  Procedure Laterality Date  . Club foot release  1968  . Appendectomy  1976  . Tonsillectomy  1967  . Cholecystectomy  1976  . Acoustic neuroma resection Left 19990  . Cardioversion N/A 06/17/2013    Procedure: CARDIOVERSION;  Surgeon: Josue Hector, MD;  Location: [facility] ENDOSCOPY;  Service: Cardiovascular;  Laterality: N/A;    Current Outpatient Prescriptions  Medication Sig Dispense Refill  . amLODipine (NORVASC) 5 MG tablet Take 5 mg by mouth daily.     Marland Kitchen apixaban (ELIQUIS) 5 MG TABS tablet Take 1 tablet (5 mg total) by mouth 2 (two) times  daily. 60 tablet 6  . atorvastatin (LIPITOR) 20 MG tablet Take 20 mg by mouth daily.    . flecainide (TAMBOCOR) 150 MG tablet Take 1 tablet (150 mg total) by mouth 2 (two) times daily. 180 tablet 3  . KLOR-CON M20 20 MEQ tablet Take 20 mEq by mouth 2 (two) times daily.     Marland Kitchen lisinopril-hydrochlorothiazide (PRINZIDE,ZESTORETIC) 20-25 MG per tablet Take 1 tablet by mouth daily.     . metoprolol (LOPRESSOR) 50 MG tablet 1 TAB  TWICE  DAILY 180 tablet 3   No current facility-administered medications for this encounter.    Allergies  Allergen Reactions  . Penicillins     Rash as a Child    Social History   Social History  . Marital Status: Married    Spouse Name: N/A  . Number of Children: N/A  . Years of Education: N/A   Occupational History  . Not on file.   Social History Main Topics  . Smoking status: Former Research scientist (life sciences)  . Smokeless tobacco: Not on file  . Alcohol Use: Yes     Comment: Weekly, Beer 2+  . Drug Use: Not on file  . Sexual Activity: Not on file   Other Topics Concern  . Not on file   Social History Narrative    Family History  Problem Relation Age of Onset  . Hypertension Father   . Hypertension Mother   .  Stroke Mother   . CVA Mother   . Hypertension Brother   . Hypercholesterolemia Brother   . Atrial fibrillation Brother   . Congestive Heart Failure Brother     ROS- All systems are reviewed and negative except as per the HPI above  Physical Exam: Filed Vitals:   08/16/15 1054  BP: 140/86  Pulse: 55  Height: 5\' 11"  (1.803 m)  Weight: 223 lb (101.152 kg)    GEN- The patient is well appearing, alert and oriented x 3 today.   Head- normocephalic, atraumatic Eyes-  Sclera clear, conjunctiva pink Ears- hearing intact Oropharynx- clear Neck- supple, no JVP Lymph- no cervical lymphadenopathy Lungs- Clear to ausculation bilaterally, normal work of breathing Heart- Regular rate and rhythm, no murmurs, rubs or gallops, PMI not laterally  displaced GI- soft, NT, ND, + BS Extremities- no clubbing, cyanosis, or edema MS- no significant deformity or atrophy Skin- no rash or lesion Psych- euthymic mood, full affect Neuro- strength and sensation are intact  EKG-Sinus brady at 55 bpm, first degree block, Pr int 210 ms, qrs int 102 ms, qtc 436 ms Epic records reviewed Echo-Left ventricle: The cavity size was normal. Wall thickness was increased in a pattern of mild LVH. Systolic function was normal. The estimated ejection fraction was in the range of 55% to 60%. - Mitral valve: Mild regurgitation.   Assessment and Plan: 1. PAF Discussed options with pt He is on young side for amiodarone, he is not excited for ablation or tikosyn, sotalol may be an option. He would like to try lifestyle changes first Sleep study for snoring and daytime somnolence Decrease alcohol intake to no more thatn 2 drinks weekly Flecainide level Increase weight loss efforts, continue regular exercise.  F/u in one month  Butch Penny C. Bryanda Mikel, [facility] [address] [place], [place] [address] [phone]

## 2015-08-17 ENCOUNTER — Other Ambulatory Visit: Payer: Self-pay | Admitting: *Deleted

## 2015-08-17 DIAGNOSIS — G4733 Obstructive sleep apnea (adult) (pediatric): Secondary | ICD-10-CM

## 2015-08-17 LAB — FLECAINIDE LEVEL: Flecainide: 0.71 ug/mL (ref 0.20–1.00)

## 2015-08-20 ENCOUNTER — Ambulatory Visit (HOSPITAL_COMMUNITY)
Admission: RE | Admit: 2015-08-20 | Discharge: 2015-08-20 | Disposition: A | Discharge status: Home / Self Care | Payer: BC Managed Care – PPO | Source: Ambulatory Visit | Attending: Nurse Practitioner | Admitting: Nurse Practitioner

## 2015-08-20 DIAGNOSIS — I481 Persistent atrial fibrillation: Secondary | ICD-10-CM | POA: Diagnosis not present

## 2015-08-20 DIAGNOSIS — I4819 Other persistent atrial fibrillation: Secondary | ICD-10-CM

## 2015-08-20 DIAGNOSIS — E785 Hyperlipidemia, unspecified: Secondary | ICD-10-CM | POA: Insufficient documentation

## 2015-08-20 DIAGNOSIS — I10 Essential (primary) hypertension: Secondary | ICD-10-CM | POA: Diagnosis not present

## 2015-08-20 LAB — ECHOCARDIOGRAM COMPLETE
E decel time: 311 msec
E/e' ratio: 7.83
FS: 20 % — AB (ref 28–44)
IVS/LV PW RATIO, ED: 0.88
LA ID, A-P, ES: 47 mm
LA diam end sys: 47 mm
LA diam index: 2.06 cm/m2
LA vol A4C: 81.3 ml
LA vol index: 39.1 mL/m2
LA vol: 89 mL
LV E/e' medial: 7.83
LV E/e'average: 7.83
LV PW d: 12 mm — AB (ref 0.6–1.1)
LV e' LATERAL: 8.49 cm/s
LVOT area: 4.91 cm2
LVOT diameter: 25 mm
Lateral S' vel: 11.7 cm/s
MV Dec: 311
MV pk A vel: 49.5 m/s
MV pk E vel: 66.5 m/s
TAPSE: 17.8 mm
TDI e' lateral: 8.49
TDI e' medial: 6.2

## 2015-08-20 NOTE — Progress Notes (Signed)
  Echocardiogram 2D Echocardiogram has been performed.  Patrick Gray 08/20/2015, 1:34 PM

## 2015-08-27 ENCOUNTER — Telehealth (HOSPITAL_COMMUNITY): Payer: Self-pay | Admitting: Nurse Practitioner

## 2015-08-27 NOTE — Telephone Encounter (Signed)
Talked to pt and let him know that ef mildly reduced and would be probably be best to switch AAD. In the interim drop dose back to 100 mg bid and will see in clinic next week. Appointment made. He states he has felt very well and no afib.

## 2015-09-02 ENCOUNTER — Encounter (HOSPITAL_COMMUNITY): Payer: Self-pay | Admitting: Nurse Practitioner

## 2015-09-02 ENCOUNTER — Ambulatory Visit (HOSPITAL_COMMUNITY)
Admission: RE | Admit: 2015-09-02 | Discharge: 2015-09-02 | Disposition: A | Discharge status: Home / Self Care | Payer: BC Managed Care – PPO | Source: Ambulatory Visit | Attending: Nurse Practitioner | Admitting: Nurse Practitioner

## 2015-09-02 VITALS — BP 132/90 | HR 67 | Ht 71.0 in | Wt 220.0 lb

## 2015-09-02 DIAGNOSIS — Z823 Family history of stroke: Secondary | ICD-10-CM | POA: Insufficient documentation

## 2015-09-02 DIAGNOSIS — Z7901 Long term (current) use of anticoagulants: Secondary | ICD-10-CM | POA: Diagnosis not present

## 2015-09-02 DIAGNOSIS — Z8249 Family history of ischemic heart disease and other diseases of the circulatory system: Secondary | ICD-10-CM | POA: Diagnosis not present

## 2015-09-02 DIAGNOSIS — I481 Persistent atrial fibrillation: Secondary | ICD-10-CM | POA: Diagnosis not present

## 2015-09-02 DIAGNOSIS — I10 Essential (primary) hypertension: Secondary | ICD-10-CM | POA: Diagnosis not present

## 2015-09-02 DIAGNOSIS — Z79899 Other long term (current) drug therapy: Secondary | ICD-10-CM | POA: Diagnosis not present

## 2015-09-02 DIAGNOSIS — E7800 Pure hypercholesterolemia, unspecified: Secondary | ICD-10-CM | POA: Diagnosis not present

## 2015-09-02 DIAGNOSIS — I4819 Other persistent atrial fibrillation: Secondary | ICD-10-CM

## 2015-09-02 DIAGNOSIS — Z87891 Personal history of nicotine dependence: Secondary | ICD-10-CM | POA: Diagnosis not present

## 2015-09-02 DIAGNOSIS — Z880 Allergy status to penicillin: Secondary | ICD-10-CM | POA: Diagnosis not present

## 2015-09-02 DIAGNOSIS — I480 Paroxysmal atrial fibrillation: Secondary | ICD-10-CM | POA: Insufficient documentation

## 2015-09-02 DIAGNOSIS — I4891 Unspecified atrial fibrillation: Secondary | ICD-10-CM | POA: Diagnosis present

## 2015-09-03 ENCOUNTER — Encounter (HOSPITAL_COMMUNITY): Payer: Self-pay | Admitting: Nurse Practitioner

## 2015-09-03 ENCOUNTER — Other Ambulatory Visit (HOSPITAL_COMMUNITY): Payer: Self-pay | Admitting: *Deleted

## 2015-09-03 DIAGNOSIS — R931 Abnormal findings on diagnostic imaging of heart and coronary circulation: Secondary | ICD-10-CM

## 2015-09-03 NOTE — Progress Notes (Signed)
Patient ID: Patrick Gray, male   DOB: 08-May-1955, 60 y.o.   MRN: [id number]     Primary Care Physician: Gara Kroner, MD Referring Physician: Seldon Illes is a 60 y.o. male with a h/o afib 2015 found by primary. Was placed on flecainide 100 mg bid and has done well until seen 6/20 and was noted to be in aflutter with RVR. Has noted some increase in irregular heart beat over the last few weeks. Flecainide was increased to 150 mg bid by Dr. Johnsie Cancel, 6/20, and asked to f/u here. EKG now shows SR, but he feels  fatigue. Wife states he does snore, has daytime somnolence and he drinks at least two beers a night. Minimal caffeine, no smoking. Exercises at the gym and has lost some weight but would like to lose 20 lbs more. Chadsvasc score is 1 for HTN, has been on blood thinner for 2 years, compliant without bleeding issues.Has an older brother and mother with afib, brother has had ablation. Flecainide level was checked on 150 mg bid and was in range. Echo ordered.  Follows up 7/13 and states has really been working on his lifestyle. Admits was drinking a case of beer a week and has only had two beers in two weeks. Since he has been drinking less, he is sleeping better and wife reports less snoring. He feels so much better and has great energy now and is going to there gym more. Echo did return with EF of 45-50%,  EF was 55-60% in 2015. Continues in SR, no afib episodes.  Today, he denies symptoms of palpitations, chest pain, shortness of breath, orthopnea, PND, lower extremity edema, dizziness, presyncope, syncope, or neurologic sequela. The patient is tolerating medications without difficulties and is otherwise without complaint today.   Past Medical History  Diagnosis Date  . HTN (hypertension)   . Hypercholesterolemia   . Deafness in left ear   . Atrial fibrillation ([place])   . Hypopotassemia    Past Surgical History  Procedure Laterality Date  . Club foot release  1968  . Appendectomy   1976  . Tonsillectomy  1967  . Cholecystectomy  1976  . Acoustic neuroma resection Left 19990  . Cardioversion N/A 06/17/2013    Procedure: CARDIOVERSION;  Surgeon: Josue Hector, MD;  Location: [facility] ENDOSCOPY;  Service: Cardiovascular;  Laterality: N/A;    Current Outpatient Prescriptions  Medication Sig Dispense Refill  . amLODipine (NORVASC) 5 MG tablet Take 5 mg by mouth daily.     Marland Kitchen apixaban (ELIQUIS) 5 MG TABS tablet Take 1 tablet (5 mg total) by mouth 2 (two) times daily. 60 tablet 6  . atorvastatin (LIPITOR) 20 MG tablet Take 20 mg by mouth daily.    . flecainide (TAMBOCOR) 100 MG tablet Take 100 mg by mouth 2 (two) times daily.    Marland Kitchen KLOR-CON M20 20 MEQ tablet Take 20 mEq by mouth 2 (two) times daily.     Marland Kitchen lisinopril-hydrochlorothiazide (PRINZIDE,ZESTORETIC) 20-25 MG per tablet Take 1 tablet by mouth daily.     . metoprolol (LOPRESSOR) 50 MG tablet 1 TAB  TWICE  DAILY 180 tablet 3   No current facility-administered medications for this encounter.    Allergies  Allergen Reactions  . Penicillins     Rash as a Child    Social History   Social History  . Marital Status: Married    Spouse Name: N/A  . Number of Children: N/A  . Years of Education: N/A  Occupational History  . Not on file.   Social History Main Topics  . Smoking status: Former Research scientist (life sciences)  . Smokeless tobacco: Not on file  . Alcohol Use: Yes     Comment: Weekly, Beer 2+  . Drug Use: Not on file  . Sexual Activity: Not on file   Other Topics Concern  . Not on file   Social History Narrative    Family History  Problem Relation Age of Onset  . Hypertension Father   . Hypertension Mother   . Stroke Mother   . CVA Mother   . Hypertension Brother   . Hypercholesterolemia Brother   . Atrial fibrillation Brother   . Congestive Heart Failure Brother     ROS- All systems are reviewed and negative except as per the HPI above  Physical Exam: Filed Vitals:   09/02/15 1330  BP: 132/90  Pulse: 67    Height: 5\' 11"  (1.803 m)  Weight: 220 lb (99.791 kg)    GEN- The patient is well appearing, alert and oriented x 3 today.   Head- normocephalic, atraumatic Eyes-  Sclera clear, conjunctiva pink Ears- hearing intact Oropharynx- clear Neck- supple, no JVP Lymph- no cervical lymphadenopathy Lungs- Clear to ausculation bilaterally, normal work of breathing Heart- Regular rate and rhythm, no murmurs, rubs or gallops, PMI not laterally displaced GI- soft, NT, ND, + BS Extremities- no clubbing, cyanosis, or edema MS- no significant deformity or atrophy Skin- no rash or lesion Psych- euthymic mood, full affect Neuro- strength and sensation are intact  EKG-SR at 67 bpm, first degree block, Pr int 232 ms, qrs int 100 ms, qtc 437 ms Epic records reviewed Echo 6-17-.- Left ventricle: The cavity size was moderately dilated. Wall  thickness was increased in a pattern of mild LVH. Systolic  function was mildly reduced. The estimated ejection fraction was  in the range of 45% to 50%. Diffuse hypokinesis. - Aortic valve: There was trivial regurgitation. - Left atrium: The atrium was moderately dilated. 47 mm  Impressions:  - Mild global reduction in LV function; moderate LVE; mild LVH;  trace AI; moderate LAE.   Assessment and Plan: 1. PAF With reduced EF, discussed with Dr. Rayann Heman if flecainide could be continued For now continue 100 mg bid. He recommends getting a stress myoview and then he will see in f/u, to determine if he can stay on flecainide Discussed options with pt Pt wants the most conservative approach possible He feels much better with recent lifestyle changes Pending sleep study but snoring significantly reduced with significant beer reduction Increase weight loss efforts, continue regular exercise.  F/u with Dr. Rayann Heman in one month  Geroge Baseman. [place], [facility] [address] [place], [place] [address] [phone]

## 2015-09-08 ENCOUNTER — Other Ambulatory Visit (HOSPITAL_COMMUNITY): Payer: Self-pay | Admitting: *Deleted

## 2015-09-08 MED ORDER — FLECAINIDE ACETATE 100 MG PO TABS: 100.0000 mg | ORAL_TABLET | Freq: Two times a day (BID) | ORAL | Status: DC

## 2015-09-13 ENCOUNTER — Ambulatory Visit (HOSPITAL_COMMUNITY): Payer: BC Managed Care – PPO | Admitting: Nurse Practitioner

## 2015-09-15 ENCOUNTER — Telehealth (HOSPITAL_COMMUNITY): Payer: Self-pay | Admitting: *Deleted

## 2015-09-15 NOTE — Telephone Encounter (Signed)
Left message on voicemail per DPR in reference to upcoming appointment scheduled on 09/17/15 with detailed instructions given per Myocardial Perfusion Study Information Sheet for the test. LM to arrive 15 minutes early, and that it is imperative to arrive on time for appointment to keep from having the test rescheduled. If you need to cancel or reschedule your appointment, please call the office within 24 hours of your appointment. Failure to do so may result in a cancellation of your appointment, and a $50 no show fee. Phone number given for call back for any questions.Kirstie Peri

## 2015-09-17 ENCOUNTER — Ambulatory Visit (HOSPITAL_COMMUNITY): Payer: BC Managed Care – PPO | Attending: Internal Medicine

## 2015-09-17 DIAGNOSIS — R002 Palpitations: Secondary | ICD-10-CM | POA: Diagnosis not present

## 2015-09-17 DIAGNOSIS — I10 Essential (primary) hypertension: Secondary | ICD-10-CM | POA: Diagnosis not present

## 2015-09-17 DIAGNOSIS — R9439 Abnormal result of other cardiovascular function study: Secondary | ICD-10-CM | POA: Insufficient documentation

## 2015-09-17 DIAGNOSIS — R5383 Other fatigue: Secondary | ICD-10-CM | POA: Diagnosis not present

## 2015-09-17 DIAGNOSIS — I4891 Unspecified atrial fibrillation: Secondary | ICD-10-CM | POA: Insufficient documentation

## 2015-09-17 DIAGNOSIS — R931 Abnormal findings on diagnostic imaging of heart and coronary circulation: Secondary | ICD-10-CM | POA: Diagnosis present

## 2015-09-17 LAB — MYOCARDIAL PERFUSION IMAGING
Estimated workload: 10.1 METS
Exercise duration (min): 9 min
Exercise duration (sec): 15 s
LV dias vol: 101 mL (ref 62–150)
LV sys vol: 42 mL
MPHR: 161 {beats}/min
Peak HR: 139 {beats}/min
Percent HR: 86 %
RATE: 0.28
Rest HR: 82 {beats}/min
SDS: 6
SRS: 8
SSS: 10
TID: 0.89

## 2015-09-17 MED ORDER — TECHNETIUM TC 99M TETROFOSMIN IV KIT
10.6000 | PACK | Freq: Once | INTRAVENOUS | Status: AC | PRN
  Administered 2015-09-17: 11 via INTRAVENOUS
  Filled 2015-09-17: qty 11

## 2015-09-17 MED ORDER — TECHNETIUM TC 99M TETROFOSMIN IV KIT
31.8000 | PACK | Freq: Once | INTRAVENOUS | Status: AC | PRN
  Administered 2015-09-17: 31.8 via INTRAVENOUS
  Filled 2015-09-17: qty 32

## 2015-09-22 ENCOUNTER — Telehealth: Payer: Self-pay

## 2015-09-22 DIAGNOSIS — Z01812 Encounter for preprocedural laboratory examination: Secondary | ICD-10-CM

## 2015-09-22 NOTE — Telephone Encounter (Signed)
Patrick Hector, MD  Sherran Needs, NP; Thompson Grayer, MD  Cc: Michaelyn Barter, RN        Myovue is abnormal and not really looking like artifact  Think he should have heart cath to exclude CAD so we  Can be confident of antiarrhythmic choice   Pam- call patient and arrange cath with me or f/u to discuss if he wants.    Called patient to arrange an appointment with Dr. Johnsie Cancel. Patient stated he wants to discuss with his wife. Patient stated he wanted to see Dr. Rayann Heman first. Tried to explain to patient that Dr. Rayann Heman is an EP doctor and does not do heart cath procedures, and that when a myoview is abnormal the next step is having a heart cath.

## 2015-09-23 NOTE — Telephone Encounter (Signed)
Patient is going to have heart cath on 10/07/15 with Dr. Johnsie Cancel at 3:30. Patient given pre procedure instructions. Patient will come in on 10/05/15 for lab work, and at that time patient will pick up his instructions from front desk. Patient verbalized understanding.

## 2015-09-23 NOTE — Telephone Encounter (Signed)
F/u ° ° °Pt returning nurse call. Please call.  °

## 2015-09-23 NOTE — Telephone Encounter (Signed)
Follow Up:; ° ° °Returning your call. °

## 2015-09-23 NOTE — Telephone Encounter (Signed)
Patient needed clarification on diet orders on day of procedure. Per Dr. Johnsie Cancel, patient can have breakfast only. Nothing to eat or drink after breakfast. Patient verbalized understanding.

## 2015-09-24 ENCOUNTER — Telehealth: Payer: Self-pay

## 2015-09-24 NOTE — Telephone Encounter (Signed)
Called patient with changes on procedure time. Instead of coming in at 1:30 pm, patient will come in at 9:00 am. Patient verbalized understanding. Sent letter of instructions through MyChart for patient to have.

## 2015-09-29 ENCOUNTER — Encounter: Payer: Self-pay | Admitting: Internal Medicine

## 2015-09-30 ENCOUNTER — Encounter (HOSPITAL_BASED_OUTPATIENT_CLINIC_OR_DEPARTMENT_OTHER): Payer: BC Managed Care – PPO

## 2015-10-05 ENCOUNTER — Other Ambulatory Visit (INDEPENDENT_AMBULATORY_CARE_PROVIDER_SITE_OTHER): Payer: BC Managed Care – PPO

## 2015-10-05 DIAGNOSIS — Z01812 Encounter for preprocedural laboratory examination: Secondary | ICD-10-CM | POA: Diagnosis not present

## 2015-10-05 LAB — BASIC METABOLIC PANEL
BUN: 16 mg/dL (ref 7–25)
CO2: 31 mmol/L (ref 20–31)
Calcium: 9.7 mg/dL (ref 8.6–10.3)
Chloride: 104 mmol/L (ref 98–110)
Creat: 0.9 mg/dL (ref 0.70–1.33)
Glucose, Bld: 95 mg/dL (ref 65–99)
Potassium: 3.4 mmol/L — ABNORMAL LOW (ref 3.5–5.3)
Sodium: 141 mmol/L (ref 135–146)

## 2015-10-05 LAB — CBC WITH DIFFERENTIAL/PLATELET
Basophils Absolute: 0 cells/uL (ref 0–200)
Basophils Relative: 0 %
Eosinophils Absolute: 141 cells/uL (ref 15–500)
Eosinophils Relative: 3 %
HCT: 43 % (ref 38.5–50.0)
Hemoglobin: 15.4 g/dL (ref 13.2–17.1)
Lymphs Abs: 1927 cells/uL (ref 850–3900)
MCH: 31.2 pg (ref 27.0–33.0)
MCHC: 35.8 g/dL (ref 32.0–36.0)
MCV: 87 fL (ref 80.0–100.0)
MPV: 9.3 fL (ref 7.5–12.5)
Monocytes Absolute: 470 cells/uL (ref 200–950)
Monocytes Relative: 10 %
Neutro Abs: 2162 cells/uL (ref 1500–7800)
Neutrophils Relative %: 46 %
Platelets: 142 10*3/uL (ref 140–400)
RBC: 4.94 MIL/uL (ref 4.20–5.80)
RDW: 13.7 % (ref 11.0–15.0)
WBC: 4.7 10*3/uL (ref 3.8–10.8)

## 2015-10-05 LAB — PROTIME-INR
INR: 1
Prothrombin Time: 10.9 s (ref 9.0–11.5)

## 2015-10-07 ENCOUNTER — Ambulatory Visit (HOSPITAL_COMMUNITY)
Admission: RE | Admit: 2015-10-07 | Discharge: 2015-10-07 | Disposition: A | Discharge status: Home / Self Care | Payer: BC Managed Care – PPO | Source: Ambulatory Visit | Attending: Cardiovascular Disease | Admitting: Cardiovascular Disease

## 2015-10-07 ENCOUNTER — Encounter (HOSPITAL_COMMUNITY)
Admission: RE | Disposition: A | Discharge status: Home / Self Care | Payer: Self-pay | Source: Ambulatory Visit | Attending: Cardiovascular Disease

## 2015-10-07 DIAGNOSIS — H9192 Unspecified hearing loss, left ear: Secondary | ICD-10-CM | POA: Insufficient documentation

## 2015-10-07 DIAGNOSIS — Z7901 Long term (current) use of anticoagulants: Secondary | ICD-10-CM | POA: Diagnosis not present

## 2015-10-07 DIAGNOSIS — I480 Paroxysmal atrial fibrillation: Secondary | ICD-10-CM | POA: Insufficient documentation

## 2015-10-07 DIAGNOSIS — E876 Hypokalemia: Secondary | ICD-10-CM | POA: Insufficient documentation

## 2015-10-07 DIAGNOSIS — Z87891 Personal history of nicotine dependence: Secondary | ICD-10-CM | POA: Insufficient documentation

## 2015-10-07 DIAGNOSIS — Z8249 Family history of ischemic heart disease and other diseases of the circulatory system: Secondary | ICD-10-CM | POA: Insufficient documentation

## 2015-10-07 DIAGNOSIS — I10 Essential (primary) hypertension: Secondary | ICD-10-CM | POA: Diagnosis not present

## 2015-10-07 DIAGNOSIS — I2510 Atherosclerotic heart disease of native coronary artery without angina pectoris: Secondary | ICD-10-CM | POA: Diagnosis present

## 2015-10-07 DIAGNOSIS — I4892 Unspecified atrial flutter: Secondary | ICD-10-CM | POA: Diagnosis not present

## 2015-10-07 DIAGNOSIS — Z823 Family history of stroke: Secondary | ICD-10-CM | POA: Diagnosis not present

## 2015-10-07 DIAGNOSIS — E7800 Pure hypercholesterolemia, unspecified: Secondary | ICD-10-CM | POA: Diagnosis not present

## 2015-10-07 HISTORY — PX: CARDIAC CATHETERIZATION: SHX172

## 2015-10-07 SURGERY — LEFT HEART CATH AND CORONARY ANGIOGRAPHY
Anesthesia: LOCAL

## 2015-10-07 SURGERY — LEFT HEART CATH AND CORONARY ANGIOGRAPHY

## 2015-10-07 MED ORDER — VERAPAMIL HCL 2.5 MG/ML IV SOLN
INTRAVENOUS | Status: AC
  Filled 2015-10-07: qty 2

## 2015-10-07 MED ORDER — FENTANYL CITRATE (PF) 100 MCG/2ML IJ SOLN
INTRAMUSCULAR | Status: DC | PRN
  Administered 2015-10-07: 50 ug via INTRAVENOUS

## 2015-10-07 MED ORDER — MIDAZOLAM HCL 2 MG/2ML IJ SOLN
INTRAMUSCULAR | Status: AC
  Filled 2015-10-07: qty 2

## 2015-10-07 MED ORDER — HEPARIN (PORCINE) IN NACL 2-0.9 UNIT/ML-% IJ SOLN
INTRAMUSCULAR | Status: DC | PRN
  Administered 2015-10-07: 1000 mL

## 2015-10-07 MED ORDER — HEPARIN SODIUM (PORCINE) 1000 UNIT/ML IJ SOLN
INTRAMUSCULAR | Status: DC | PRN
  Administered 2015-10-07: 4000 [IU] via INTRAVENOUS

## 2015-10-07 MED ORDER — SODIUM CHLORIDE 0.9% FLUSH: 3.0000 mL | INTRAVENOUS | Status: DC | PRN

## 2015-10-07 MED ORDER — SODIUM CHLORIDE 0.9 % IV SOLN: 250.0000 mL | INTRAVENOUS | Status: DC | PRN

## 2015-10-07 MED ORDER — ASPIRIN 81 MG PO CHEW: 81.0000 mg | CHEWABLE_TABLET | ORAL | Status: DC

## 2015-10-07 MED ORDER — HEPARIN (PORCINE) IN NACL 2-0.9 UNIT/ML-% IJ SOLN
INTRAMUSCULAR | Status: AC
  Filled 2015-10-07: qty 1000

## 2015-10-07 MED ORDER — SODIUM CHLORIDE 0.9 % WEIGHT BASED INFUSION: 3.0000 mL/kg/h | INTRAVENOUS | Status: AC

## 2015-10-07 MED ORDER — OXYCODONE-ACETAMINOPHEN 5-325 MG PO TABS: 1.0000 | ORAL_TABLET | ORAL | Status: DC | PRN

## 2015-10-07 MED ORDER — SODIUM CHLORIDE 0.9% FLUSH: 3.0000 mL | Freq: Two times a day (BID) | INTRAVENOUS | Status: DC

## 2015-10-07 MED ORDER — SODIUM CHLORIDE 0.9 % WEIGHT BASED INFUSION
3.0000 mL/kg/h | INTRAVENOUS | Status: DC
  Administered 2015-10-07: 3 mL/kg/h via INTRAVENOUS

## 2015-10-07 MED ORDER — NITROGLYCERIN 1 MG/10 ML FOR IR/CATH LAB
INTRA_ARTERIAL | Status: AC
  Filled 2015-10-07: qty 10

## 2015-10-07 MED ORDER — HEPARIN SODIUM (PORCINE) 1000 UNIT/ML IJ SOLN
INTRAMUSCULAR | Status: AC
  Filled 2015-10-07: qty 1

## 2015-10-07 MED ORDER — IOPAMIDOL (ISOVUE-370) INJECTION 76%
INTRAVENOUS | Status: AC
  Filled 2015-10-07: qty 100

## 2015-10-07 MED ORDER — LIDOCAINE HCL (PF) 1 % IJ SOLN
INTRAMUSCULAR | Status: AC
  Filled 2015-10-07: qty 30

## 2015-10-07 MED ORDER — SODIUM CHLORIDE 0.9 % WEIGHT BASED INFUSION: 1.0000 mL/kg/h | INTRAVENOUS | Status: DC

## 2015-10-07 MED ORDER — LIDOCAINE HCL (PF) 1 % IJ SOLN
INTRAMUSCULAR | Status: DC | PRN
  Administered 2015-10-07: 2 mL

## 2015-10-07 MED ORDER — VERAPAMIL HCL 2.5 MG/ML IV SOLN
INTRAVENOUS | Status: DC | PRN
  Administered 2015-10-07: 10 mL via INTRA_ARTERIAL

## 2015-10-07 MED ORDER — FENTANYL CITRATE (PF) 100 MCG/2ML IJ SOLN
INTRAMUSCULAR | Status: AC
  Filled 2015-10-07: qty 2

## 2015-10-07 MED ORDER — MIDAZOLAM HCL 2 MG/2ML IJ SOLN
INTRAMUSCULAR | Status: DC | PRN
  Administered 2015-10-07: 2 mg via INTRAVENOUS

## 2015-10-07 MED ORDER — DIAZEPAM 2 MG PO TABS: 2.0000 mg | ORAL_TABLET | Freq: Four times a day (QID) | ORAL | Status: DC | PRN

## 2015-10-07 MED ORDER — IOPAMIDOL (ISOVUE-370) INJECTION 76%
INTRAVENOUS | Status: DC | PRN
  Administered 2015-10-07: 70 mL via INTRAVENOUS

## 2015-10-07 SURGICAL SUPPLY — 12 items

## 2015-10-07 NOTE — H&P (Signed)
Physician History and Physical     Patient ID: Patrick Gray MRN: [id number] DOB/AGE: 60-Oct-1957 60 y.o. Admit date: (Not on file)  Primary Care Physician: Gara Kroner, MD Primary Cardiologist: Johnsie Cancel  Active Problems:   * No active hospital problems. *   HPI:   Patrick Gray is a 60 y.o.  male with a h/o afib 2015 found by primary. Was placed on flecainide 100 mg bid and has done well until seen 6/20 and was noted to be in aflutter with RVR. Has noted some increase in irregular heart beat over the last few weeks. Flecainide was increased to 150 mg bid by Dr. Johnsie Cancel, 6/20, and asked to f/u here. EKG now shows SR, but he feels  fatigue. Wife states he does snore, has daytime somnolence and he drinks at least two beers a night. Minimal caffeine, no smoking. Exercises at the gym and has lost some weight but would like to lose 20 lbs more. Chadsvasc score is 1 for HTN, has been on blood thinner for 2 years, compliant without bleeding issues.Has an older brother and mother with afib, brother has had ablation. Flecainide level was checked on 150 mg bid and was in range. Echo ordered.  Follows up 7/13 and states has really been working on his lifestyle. Admits was drinking a case of beer a week and has only had two beers in two weeks. Since he has been drinking less, he is sleeping better and wife reports less snoring. He feels so much better and has great energy now and is going to there gym more. Echo did return with EF of 45-50%,  EF was 55-60% in 2015. Continues in SR, no afib episodes.  Today, he denies symptoms of palpitations, chest pain, shortness of breath, orthopnea, PND, lower extremity edema, dizziness, presyncope, syncope, or neurologic sequela. The patient is tolerating medications without difficulties and is otherwise without complaint today.  Myovue 09/17/15 abnormal ischemia.  The left ventricular ejection fraction is normal (55-65%).  Nuclear stress EF: 58%. No wall motion  abnormalities  There was no ST segment deviation noted during stress.  Defect 1: There is a small defect of moderate severity present in the mid inferior location.  Findings consistent with ischemia.  This is a low risk study. Cath set up for today  Review of systems complete and found to be negative unless listed above   Past Medical History:  Diagnosis Date  . Atrial fibrillation ([place])   . Deafness in left ear   . HTN (hypertension)   . Hypercholesterolemia   . Hypopotassemia     Family History  Problem Relation Age of Onset  . Hypertension Father   . Hypertension Mother   . Stroke Mother   . CVA Mother   . Hypertension Brother   . Hypercholesterolemia Brother   . Atrial fibrillation Brother   . Congestive Heart Failure Brother     Social History   Social History  . Marital status: Married    Spouse name: N/A  . Number of children: N/A  . Years of education: N/A   Occupational History  . Not on file.   Social History Main Topics  . Smoking status: Former Research scientist (life sciences)  . Smokeless tobacco: Never Used  . Alcohol use Yes     Comment: Weekly, Beer 2+  . Drug use: Unknown  . Sexual activity: Not on file   Other Topics Concern  . Not on file   Social History Narrative  . No narrative on  file    Past Surgical History:  Procedure Laterality Date  . ACOUSTIC NEUROMA RESECTION Left 19990  . APPENDECTOMY  1976  . CARDIOVERSION N/A 06/17/2013   Procedure: CARDIOVERSION;  Surgeon: Josue Hector, MD;  Location: [facility];  Service: Cardiovascular;  Laterality: N/A;  . CHOLECYSTECTOMY  1976  . CLUB FOOT RELEASE  1968  . TONSILLECTOMY  1967     No prescriptions prior to admission.    Physical Exam: Blood pressure (!) 157/108, pulse 66, temperature 97.8 F (36.6 C), temperature source Oral, resp. rate 18, height 5\' 11"  (1.803 m), weight 212 lb (96.2 kg), SpO2 99 %.    Affect appropriate Healthy:  appears stated age 29: normal Neck supple with no  adenopathy JVP normal no bruits no thyromegaly Lungs clear with no wheezing and good diaphragmatic motion Heart:  S1/S2 no murmur, no rub, gallop or click PMI normal Abdomen: benighn, BS positve, no tenderness, no AAA no bruit.  No HSM or HJR Distal pulses intact with no bruits No edema Neuro non-focal Skin warm and dry No muscular weakness  No current facility-administered medications on file prior to encounter.    Current Outpatient Prescriptions on File Prior to Encounter  Medication Sig Dispense Refill  . amLODipine (NORVASC) 5 MG tablet Take 5 mg by mouth daily.     Marland Kitchen apixaban (ELIQUIS) 5 MG TABS tablet Take 1 tablet (5 mg total) by mouth 2 (two) times daily. 60 tablet 6  . atorvastatin (LIPITOR) 20 MG tablet Take 20 mg by mouth daily.    . flecainide (TAMBOCOR) 100 MG tablet Take 1 tablet (100 mg total) by mouth 2 (two) times daily. 60 tablet 3  . KLOR-CON M20 20 MEQ tablet Take 20 mEq by mouth 2 (two) times daily.     Marland Kitchen lisinopril-hydrochlorothiazide (PRINZIDE,ZESTORETIC) 20-25 MG per tablet Take 1 tablet by mouth daily.     . metoprolol (LOPRESSOR) 50 MG tablet 1 TAB  TWICE  DAILY (Patient taking differently: Take 50 mg by mouth 2 (two) times daily. ) 180 tablet 3    Labs:   Lab Results  Component Value Date   WBC 4.7 10/05/2015   HGB 15.4 10/05/2015   HCT 43.0 10/05/2015   MCV 87.0 10/05/2015   PLT 142 10/05/2015    Recent Labs Lab 10/05/15 1008  NA 141  K 3.4*  CL 104  CO2 31  BUN 16  CREATININE 0.90  CALCIUM 9.7  GLUCOSE 95   No results found for: CKTOTAL, CKMB, CKMBINDEX, TROPONINI  No results found for: CHOL No results found for: HDL No results found for: LDLCALC No results found for: TRIG No results found for: CHOLHDL No results found for: LDLDIRECT     Radiology: No results found.  EKG: SR normal   ASSESSMENT AND PLAN:   Abnormal myovue:  Cath today risks discussed willing to proceed. Need to exclude CAD To better be able to choose  antiarrhythmic agents for PAF   Signed: Collier Salina Nishan8/17/2017, 10:35 AM

## 2015-10-07 NOTE — CV Procedure (Signed)
See note in cath section No significant CAD EF 65%

## 2015-10-07 NOTE — Discharge Instructions (Signed)
Radial Site Care °Refer to this sheet in the next few weeks. These instructions provide you with information about caring for yourself after your procedure. Your health care provider may also give you more specific instructions. Your treatment has been planned according to current medical practices, but problems sometimes occur. Call your health care provider if you have any problems or questions after your procedure. °WHAT TO EXPECT AFTER THE PROCEDURE °After your procedure, it is typical to have the following: °· Bruising at the radial site that usually fades within 1-2 weeks. °· Blood collecting in the tissue (hematoma) that may be painful to the touch. It should usually decrease in size and tenderness within 1-2 weeks. °HOME CARE INSTRUCTIONS °· Take medicines only as directed by your health care provider. °· You may shower 24-48 hours after the procedure or as directed by your health care provider. Remove the bandage (dressing) and gently wash the site with plain soap and water. Pat the area dry with a clean towel. Do not rub the site, because this may cause bleeding. °· Do not take baths, swim, or use a hot tub until your health care provider approves. °· Check your insertion site every day for redness, swelling, or drainage. °· Do not apply powder or lotion to the site. °· Do not flex or bend the affected arm for 24 hours or as directed by your health care provider. °· Do not push or pull heavy objects with the affected arm for 24 hours or as directed by your health care provider. °· Do not lift over 10 lb (4.5 kg) for 5 days after your procedure or as directed by your health care provider. °· Ask your health care provider when it is okay to: °¨ Return to work or school. °¨ Resume usual physical activities or sports. °¨ Resume sexual activity. °· Do not drive home if you are discharged the same day as the procedure. Have someone else drive you. °· You may drive 24 hours after the procedure unless otherwise  instructed by your health care provider. °· Do not operate machinery or power tools for 24 hours after the procedure. °· If your procedure was done as an outpatient procedure, which means that you went home the same day as your procedure, a responsible adult should be with you for the first 24 hours after you arrive home. °· Keep all follow-up visits as directed by your health care provider. This is important. °SEEK MEDICAL CARE IF: °· You have a fever. °· You have chills. °· You have increased bleeding from the radial site. Hold pressure on the site. °SEEK IMMEDIATE MEDICAL CARE IF: °· You have unusual pain at the radial site. °· You have redness, warmth, or swelling at the radial site. °· You have drainage (other than a small amount of blood on the dressing) from the radial site. °· The radial site is bleeding, and the bleeding does not stop after 30 minutes of holding steady pressure on the site. °· Your arm or hand becomes pale, cool, tingly, or numb. °  °This information is not intended to replace advice given to you by your health care provider. Make sure you discuss any questions you have with your health care provider. °  °Document Released: 03/11/2010 Document Revised: 02/27/2014 Document Reviewed: 08/25/2013 °Elsevier Interactive Patient Education ©2016 [employer] ° °

## 2015-10-08 ENCOUNTER — Encounter (HOSPITAL_COMMUNITY): Payer: Self-pay | Admitting: Cardiovascular Disease

## 2015-10-08 MED FILL — Nitroglycerin IV Soln 100 MCG/ML in D5W: INTRA_ARTERIAL | Qty: 10 | Status: AC

## 2015-10-14 ENCOUNTER — Encounter: Payer: Self-pay | Admitting: Internal Medicine

## 2015-10-14 ENCOUNTER — Ambulatory Visit (INDEPENDENT_AMBULATORY_CARE_PROVIDER_SITE_OTHER): Payer: BC Managed Care – PPO | Admitting: Internal Medicine

## 2015-10-14 VITALS — BP 146/102 | HR 51 | Ht 71.0 in | Wt 218.0 lb

## 2015-10-14 DIAGNOSIS — I480 Paroxysmal atrial fibrillation: Secondary | ICD-10-CM

## 2015-10-14 DIAGNOSIS — I10 Essential (primary) hypertension: Secondary | ICD-10-CM

## 2015-10-14 MED ORDER — APIXABAN 5 MG PO TABS: 5.0000 mg | ORAL_TABLET | Freq: Two times a day (BID) | ORAL | 11 refills | Status: DC

## 2015-10-14 NOTE — Patient Instructions (Signed)

## 2015-10-14 NOTE — Progress Notes (Signed)
Electrophysiology Office Note   Date:  10/14/2015   ID:  Patrick, Gray 1955-10-04, MRN [id number]  PCP:  Gara Kroner, MD  Cardiologist:  Dr Johnsie Cancel Primary Electrophysiologist: Thompson Grayer, MD    Chief Complaint  Patient presents with  . Atrial Fibrillation     History of Present Illness: Patrick Gray is a 60 y.o. male who presents today for electrophysiology evaluation.   He reports initially being diagnosed with atrial fibrillation in 2015 after presenting for routine physical examination.  He underwent cardioversion shortly thereafter.  He returned to afib 1 day later.  He was initiated on flecainide and converted to sinus rhythm.  He remained in sinus for about 6 months or more.  He has had episodes of afib related to heavy ETOh or stress.  Episodes increased in frequency and duration and flecainide was recently  Increased to 150mg  BID.  Currently he is taking flecainide 100mg  BID.  He has made significant lifestyle modification (ETOH reduction) and has done "much better" since.   During afib, he reports symptoms of fatigue and decreased exercise tolerance.  He recently had cath which revealed no obstructive CAD.  He recently had ekg which revealed afib and atrial flutter.  Today, he denies symptoms of palpitations, chest pain, shortness of breath, orthopnea, PND, lower extremity edema, claudication, dizziness, presyncope, syncope, bleeding, or neurologic sequela. The patient is tolerating medications without difficulties and is otherwise without complaint today.    Past Medical History:  Diagnosis Date  . Deafness in left ear   . HTN (hypertension)   . Hypercholesterolemia   . Hypopotassemia   . Paroxysmal atrial fibrillation [facility])    Past Surgical History:  Procedure Laterality Date  . ACOUSTIC NEUROMA RESECTION Left 19990  . APPENDECTOMY  1976  . CARDIAC CATHETERIZATION N/A 10/07/2015   Procedure: Left Heart Cath and Coronary Angiography;  Surgeon: Josue Hector,  MD;  Location: [place] [facility];  Service: Cardiovascular;  Laterality: N/A;  . CARDIOVERSION N/A 06/17/2013   Procedure: CARDIOVERSION;  Surgeon: Josue Hector, MD;  Location: [place];  Service: Cardiovascular;  Laterality: N/A;  . CHOLECYSTECTOMY  1976  . CLUB FOOT RELEASE  1968  . TONSILLECTOMY  1967     Current Outpatient Prescriptions  Medication Sig Dispense Refill  . amLODipine (NORVASC) 5 MG tablet Take 5 mg by mouth daily.     Marland Kitchen apixaban (ELIQUIS) 5 MG TABS tablet Take 1 tablet (5 mg total) by mouth 2 (two) times daily. 60 tablet 6  . aspirin 81 MG tablet Take 81 mg by mouth daily.    Marland Kitchen atorvastatin (LIPITOR) 20 MG tablet Take 20 mg by mouth daily.    . Cholecalciferol (VITAMIN D3 PO) Take 1 capsule by mouth daily.    Marland Kitchen co-enzyme Q-10 30 MG capsule Take 30 mg by mouth daily.    . flecainide (TAMBOCOR) 100 MG tablet Take 1 tablet (100 mg total) by mouth 2 (two) times daily. 60 tablet 3  . KLOR-CON M20 20 MEQ tablet Take 20 mEq by mouth 2 (two) times daily.     Marland Kitchen lisinopril-hydrochlorothiazide (PRINZIDE,ZESTORETIC) 20-25 MG per tablet Take 1 tablet by mouth daily.     . magnesium 30 MG tablet Take 30 mg by mouth daily.    . metoprolol (LOPRESSOR) 50 MG tablet Take 1 tablet by mouth 2 (two) times daily.    . TURMERIC PO Take 1 tablet by mouth daily.    Marland Kitchen VITAMIN K PO Take 1  tablet by mouth daily.     No current facility-administered medications for this visit.     Allergies:   Penicillins   Social History:  The patient  reports that he has quit smoking. He has never used smokeless tobacco. He reports that he drinks alcohol. He reports that he does not use drugs.   Family History:  The patient's  family history includes Atrial fibrillation in his brother; CVA in his mother; Congestive Heart Failure in his brother; Hypercholesterolemia in his brother; Hypertension in his brother, father, and mother; Stroke in his mother.    ROS:  Please see the history of present  illness.   All other systems are reviewed and negative.    PHYSICAL EXAM: VS:  BP (!) 146/102   Pulse (!) 51   Ht 5\' 11"  (1.803 m)   Wt 218 lb (98.9 kg)   BMI 30.40 kg/m  , BMI Body mass index is 30.4 kg/m. GEN: Well nourished, well developed, in no acute distress  HEENT: normal  Neck: no JVD, carotid bruits, or masses Cardiac: RRR; no murmurs, rubs, or gallops,no edema  Respiratory:  clear to auscultation bilaterally, normal work of breathing GI: soft, nontender, nondistended, + BS MS: no deformity or atrophy  Skin: warm and dry  Neuro:  Strength and sensation are intact Psych: euthymic mood, full affect  EKG:  EKG is ordered today. The ekg ordered today shows sinus rhythm 51 bpm, PR 206 msec, QTc 429 msec, otherwise normal ekt   Recent Labs: 08/16/2015: Magnesium 2.3 10/05/2015: BUN 16; Creat 0.90; Hemoglobin 15.4; Platelets 142; Potassium 3.4; Sodium 141    Lipid Panel  No results found for: CHOL, TRIG, HDL, CHOLHDL, VLDL, LDLCALC, LDLDIRECT   Wt Readings from Last 3 Encounters:  10/14/15 218 lb (98.9 kg)  10/07/15 212 lb (96.2 kg)  09/02/15 220 lb (99.8 kg)      Other studies Reviewed: Additional studies/ records that were reviewed today include: Echo, cath, AF clinic and Dr Mariana Arn notes, old ekgs  Review of the above records today demonstrates: as above   ASSESSMENT AND PLAN:  1.  Paroxysmal atrial fibrillation and atrial flutter The patient has symptomatic atrial arrhythmias.  He has failed medical therapy with flecainide.  He has recently done better with ETOH cessation, regular exercise, and weight loss.  Therapeutic strategies for afib including medicine (sotalol or tikosyn) and ablation were discussed in detail with the patient today. Risk, benefits, and alternatives to EP study and radiofrequency ablation for afib were also discussed in detail today.  At this time, he is pleased that his afib burden is improved with lifestyle change.  I think that this is  reasonable.  If afib returns then I would favor ablation. chads2vasc score is 1.  He would like to continue eliquis. Stop ASA  2. HTN Stable No change required today  3. Reduced EF Likely AF related Given absence of CAD, I would favor continuing flecainide.  4. ETOh Avoidance encouraged  Follow-up:  Return to see me in 3 months  Current medicines are reviewed at length with the patient today.   The patient does not have concerns regarding his medicines.  The following changes were made today:  none  Labs/ tests ordered today include:   No orders of the defined types were placed in this encounter.    Army Fossa, MD  10/14/2015 12:09 PM     [address] [place] [address] [phone] (office) [phone] (fax)

## 2015-10-27 ENCOUNTER — Telehealth (HOSPITAL_COMMUNITY): Payer: Self-pay | Admitting: *Deleted

## 2015-10-27 NOTE — Telephone Encounter (Signed)
Pt called in stating he has been in afib since last week and wondering if he can increase his flecainide dose to 150mg  twice a day. Discussed with Roderic Palau NP - preferred to keep patient on current dose of flecainide and bring in for ekg for possible cardioversion. Pt in agreement appt made for Friday.

## 2015-10-29 ENCOUNTER — Ambulatory Visit (HOSPITAL_COMMUNITY)
Admission: RE | Admit: 2015-10-29 | Discharge: 2015-10-29 | Disposition: A | Discharge status: Home / Self Care | Payer: BC Managed Care – PPO | Source: Ambulatory Visit | Attending: Nurse Practitioner | Admitting: Nurse Practitioner

## 2015-10-29 ENCOUNTER — Encounter (HOSPITAL_COMMUNITY): Payer: Self-pay | Admitting: Nurse Practitioner

## 2015-10-29 VITALS — BP 126/90 | HR 75 | Ht 71.0 in | Wt 217.4 lb

## 2015-10-29 DIAGNOSIS — E7800 Pure hypercholesterolemia, unspecified: Secondary | ICD-10-CM | POA: Diagnosis not present

## 2015-10-29 DIAGNOSIS — Z7901 Long term (current) use of anticoagulants: Secondary | ICD-10-CM | POA: Insufficient documentation

## 2015-10-29 DIAGNOSIS — I2510 Atherosclerotic heart disease of native coronary artery without angina pectoris: Secondary | ICD-10-CM | POA: Diagnosis not present

## 2015-10-29 DIAGNOSIS — Z87891 Personal history of nicotine dependence: Secondary | ICD-10-CM | POA: Insufficient documentation

## 2015-10-29 DIAGNOSIS — H9192 Unspecified hearing loss, left ear: Secondary | ICD-10-CM | POA: Insufficient documentation

## 2015-10-29 DIAGNOSIS — I480 Paroxysmal atrial fibrillation: Secondary | ICD-10-CM | POA: Insufficient documentation

## 2015-10-29 DIAGNOSIS — Z880 Allergy status to penicillin: Secondary | ICD-10-CM | POA: Diagnosis not present

## 2015-10-29 DIAGNOSIS — Z79899 Other long term (current) drug therapy: Secondary | ICD-10-CM | POA: Insufficient documentation

## 2015-10-29 DIAGNOSIS — I10 Essential (primary) hypertension: Secondary | ICD-10-CM | POA: Diagnosis not present

## 2015-10-29 DIAGNOSIS — E876 Hypokalemia: Secondary | ICD-10-CM | POA: Insufficient documentation

## 2015-10-29 NOTE — Progress Notes (Signed)
Patient ID: Patrick Gray, male   DOB: [birth year], 60 y.o.   MRN: [id number]     Primary Care Physician: Gara Kroner, MD Referring Physician: Hideo Tobiason is a 60 y.o. male with a h/o afib 2015 found by primary. Was placed on flecainide 100 mg bid and has done well until seen 6/20 and was noted to be in aflutter with RVR. Has noted some increase in irregular heart beat over the last few weeks. Flecainide was increased to 150 mg bid by Dr. Johnsie Cancel, 6/20, and asked to f/u here. EKG now shows SR, but he feels  fatigue. Wife states he does snore, has daytime somnolence and he drinks at least two beers a night. Minimal caffeine, no smoking. Exercises at the gym and has lost some weight but would like to lose 20 lbs more. Chadsvasc score is 1 for HTN, has been on blood thinner for 2 years, compliant without bleeding issues.Has an older brother and mother with afib, brother has had ablation. Flecainide level was checked on 150 mg bid and was in range. Echo ordered.  Follows up 7/13 and states has really been working on his lifestyle. Admits was drinking a case of beer a week and has only had two beers in two weeks. Since he has been drinking less, he is sleeping better and wife reports less snoring. He feels so much better and has great energy now and is going to there gym more. Echo did return with EF of 45-50%,  EF was 55-60% in 2015. Continues in SR, no afib episodes.  Being seeing in afib clinic due to pt c/o afib for last 10 days. Since I saw pt last, he had abnormal tress test and subsequent catherization and had insignificant 30% disease of RCA, EF was normal. He was seen in f/u by Dr. Rayann Heman and was having very little afib burden, with lifestyle changes, and it was decided that he would stay on flecainide.But Dr. Rayann Heman did mention that if afib returned he would consider ablation. Pt given option of having cardioversion and /or seeing Dr. Rayann Heman back for possible ablation. He tells me that he  has returned to SR after 2-3 weeks of being in afib and he would like to avoid DCCV and get appointment with Dr. Rayann Heman. He does have more fatigue with afib but still can continue his usual activities.  Today, he denies symptoms of palpitations, chest pain, shortness of breath, orthopnea, PND, lower extremity edema, dizziness, presyncope, syncope, or neurologic sequela. The patient is tolerating medications without difficulties and is otherwise without complaint today.   Past Medical History:  Diagnosis Date  . Deafness in left ear   . HTN (hypertension)   . Hypercholesterolemia   . Hypopotassemia   . Paroxysmal atrial fibrillation [facility])    Past Surgical History:  Procedure Laterality Date  . ACOUSTIC NEUROMA RESECTION Left 19990  . APPENDECTOMY  1976  . CARDIAC CATHETERIZATION N/A 10/07/2015   Procedure: Left Heart Cath and Coronary Angiography;  Surgeon: Josue Hector, MD;  Location: [place] [facility];  Service: Cardiovascular;  Laterality: N/A;  . CARDIOVERSION N/A 06/17/2013   Procedure: CARDIOVERSION;  Surgeon: Josue Hector, MD;  Location: [place];  Service: Cardiovascular;  Laterality: N/A;  . CHOLECYSTECTOMY  1976  . CLUB FOOT RELEASE  1968  . TONSILLECTOMY  1967    Current Outpatient Prescriptions  Medication Sig Dispense Refill  . amLODipine (NORVASC) 5 MG tablet Take 5 mg by mouth daily.     Marland Kitchen  apixaban (ELIQUIS) 5 MG TABS tablet Take 1 tablet (5 mg total) by mouth 2 (two) times daily. 60 tablet 11  . atorvastatin (LIPITOR) 20 MG tablet Take 20 mg by mouth daily.    . Cholecalciferol (VITAMIN D3 PO) Take 1 capsule by mouth daily.    Marland Kitchen co-enzyme Q-10 30 MG capsule Take 30 mg by mouth daily.    . flecainide (TAMBOCOR) 100 MG tablet Take 1 tablet (100 mg total) by mouth 2 (two) times daily. 60 tablet 3  . KLOR-CON M20 20 MEQ tablet Take 20 mEq by mouth 2 (two) times daily.     Marland Kitchen lisinopril-hydrochlorothiazide (PRINZIDE,ZESTORETIC) 20-25 MG per tablet Take 1 tablet by  mouth daily.     . magnesium 30 MG tablet Take 30 mg by mouth daily.    . metoprolol (LOPRESSOR) 50 MG tablet Take 1 tablet by mouth 2 (two) times daily.    . TURMERIC PO Take 1 tablet by mouth daily.    Marland Kitchen VITAMIN K PO Take 1 tablet by mouth daily.     No current facility-administered medications for this encounter.     Allergies  Allergen Reactions  . Penicillins     Rash as a Child    Social History   Social History  . Marital status: Married    Spouse name: N/A  . Number of children: N/A  . Years of education: N/A   Occupational History  . Not on file.   Social History Main Topics  . Smoking status: Former Research scientist (life sciences)  . Smokeless tobacco: Never Used  . Alcohol use Yes     Comment: previously 20 beers per week, now 2 beers per week  . Drug use: No  . Sexual activity: Not on file   Other Topics Concern  . Not on file   Social History Narrative   Lives in [place] wife.  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    Family History  Problem Relation Age of Onset  . Hypertension Father   . Hypertension Mother   . Stroke Mother   . CVA Mother   . Hypertension Brother   . Hypercholesterolemia Brother   . Atrial fibrillation Brother   . Congestive Heart Failure Brother     ROS- All systems are reviewed and negative except as per the HPI above  Physical Exam: Vitals:   10/29/15 1037  BP: 126/90  Pulse: 75  Weight: 217 lb 6.4 oz (98.6 kg)  Height: 5\' 11"  (1.803 m)    GEN- The patient is well appearing, alert and oriented x 3 today.   Head- normocephalic, atraumatic Eyes-  Sclera clear, conjunctiva pink Ears- hearing intact Oropharynx- clear Neck- supple, no JVP Lymph- no cervical lymphadenopathy Lungs- Clear to ausculation bilaterally, normal work of breathing Heart-Irregular rate and rhythm, no murmurs, rubs or gallops, PMI not laterally displaced GI- soft, NT, ND, + BS Extremities- no clubbing, cyanosis, or edema MS- no significant deformity or  atrophy Skin- no rash or lesion Psych- euthymic mood, full affect Neuro- strength and sensation are intact  EKG-atrial flutter with variable AV block at 75 bpm, qrs int 106 ms, qtc 457 ms Epic records reviewed Echo 6-17-.- Left ventricle: The cavity size was moderately dilated. Wall  thickness was increased in a pattern of mild LVH. Systolic  function was mildly reduced. The estimated ejection fraction was  in the range of 45% to 50%. Diffuse hypokinesis. - Aortic valve: There was trivial regurgitation. - Left atrium: The atrium was moderately dilated. 47 mm  Impressions:  - Mild global reduction in LV function; moderate LVE; mild LVH;   trace AI; moderate LAE.  10/07/15-Cardiac catherization-Prox RCA lesion, 30 %stenosed.  RPDA lesion, 30 %stenosed.   Normal right dominant coronary arteries with false positive myovue EF 65% Only 30% proximal/mid and PLA lesions seen  Assessment and Plan: 1. PAF Currently in aflutter with controlled v rate  Continue flecainide 100 mg bid Continue metoprolol 50 mg bid Continue apixaban 5 mg bid for chadsvasc of 2 (htn, insignificant cad) Will schedule an appointment with Dr. Rayann Heman to discuss ablation, pt defers cardioversion at this time.  F/u with Dr. Lawrence Marseilles C. Tishia Maestre, [facility] [address] [place], [place] [address] [phone]

## 2015-11-01 ENCOUNTER — Ambulatory Visit: Payer: BC Managed Care – PPO | Admitting: Cardiovascular Disease

## 2015-11-15 ENCOUNTER — Encounter: Payer: Self-pay | Admitting: Internal Medicine

## 2015-11-15 ENCOUNTER — Ambulatory Visit (INDEPENDENT_AMBULATORY_CARE_PROVIDER_SITE_OTHER): Payer: BC Managed Care – PPO | Admitting: Internal Medicine

## 2015-11-15 VITALS — BP 142/98 | HR 49 | Ht 71.0 in | Wt 217.4 lb

## 2015-11-15 DIAGNOSIS — I10 Essential (primary) hypertension: Secondary | ICD-10-CM

## 2015-11-15 DIAGNOSIS — I480 Paroxysmal atrial fibrillation: Secondary | ICD-10-CM | POA: Diagnosis not present

## 2015-11-15 LAB — CBC WITH DIFFERENTIAL/PLATELET
Basophils Absolute: 0 cells/uL (ref 0–200)
Basophils Relative: 0 %
Eosinophils Absolute: 290 cells/uL (ref 15–500)
Eosinophils Relative: 5 %
HCT: 43.8 % (ref 38.5–50.0)
Hemoglobin: 15.9 g/dL (ref 13.2–17.1)
Lymphocytes Relative: 40 %
Lymphs Abs: 2320 cells/uL (ref 850–3900)
MCH: 31.5 pg (ref 27.0–33.0)
MCHC: 36.3 g/dL — ABNORMAL HIGH (ref 32.0–36.0)
MCV: 86.9 fL (ref 80.0–100.0)
MPV: 9.2 fL (ref 7.5–12.5)
Monocytes Absolute: 638 cells/uL (ref 200–950)
Monocytes Relative: 11 %
Neutro Abs: 2552 cells/uL (ref 1500–7800)
Neutrophils Relative %: 44 %
Platelets: 126 10*3/uL — ABNORMAL LOW (ref 140–400)
RBC: 5.04 MIL/uL (ref 4.20–5.80)
RDW: 13.7 % (ref 11.0–15.0)
WBC: 5.8 10*3/uL (ref 3.8–10.8)

## 2015-11-15 LAB — BASIC METABOLIC PANEL
BUN: 17 mg/dL (ref 7–25)
CO2: 30 mmol/L (ref 20–31)
Calcium: 9.8 mg/dL (ref 8.6–10.3)
Chloride: 103 mmol/L (ref 98–110)
Creat: 0.87 mg/dL (ref 0.70–1.25)
Glucose, Bld: 96 mg/dL (ref 65–99)
Potassium: 3.4 mmol/L — ABNORMAL LOW (ref 3.5–5.3)
Sodium: 142 mmol/L (ref 135–146)

## 2015-11-15 NOTE — Progress Notes (Signed)
Electrophysiology Office Note   Date:  11/15/2015   ID:  Lemoyne, Charping 17-Jan-1956, MRN [id number]  PCP:  Gara Kroner, MD  Cardiologist:  Dr Johnsie Cancel Primary Electrophysiologist: Thompson Grayer, MD    Chief Complaint  Patient presents with  . Atrial Fibrillation     History of Present Illness: Patrick Gray is a 60 y.o. male who presents today for electrophysiology evaluation.   He reports initially being diagnosed with atrial fibrillation in 2015 after presenting for routine physical examination.  He underwent cardioversion shortly thereafter.  He returned to afib 1 day later.  He was initiated on flecainide and converted to sinus rhythm.  He remained in sinus for about 6 months or more.  He has had episodes of afib related to heavy ETOh or stress.  Episodes increased in frequency and duration and flecainide was recently  Increased to 150mg  BID.  Currently he is taking flecainide 100mg  BID.  He has made significant lifestyle modification (ETOH reduction) and has done "much better" since.  He continues to have afib and atrial flutter.  He reports significant symptoms when in afib.  Today, he denies symptoms of palpitations, chest pain, shortness of breath, orthopnea, PND, lower extremity edema, claudication, dizziness, presyncope, syncope, bleeding, or neurologic sequela. The patient is tolerating medications without difficulties and is otherwise without complaint today.    Past Medical History:  Diagnosis Date  . Deafness in left ear   . HTN (hypertension)   . Hypercholesterolemia   . Hypopotassemia   . Paroxysmal atrial fibrillation (HCC)        Atrial flutter (typical)   Past Surgical History:  Procedure Laterality Date  . ACOUSTIC NEUROMA RESECTION Left 19990  . APPENDECTOMY  1976  . CARDIAC CATHETERIZATION N/A 10/07/2015   Procedure: Left Heart Cath and Coronary Angiography;  Surgeon: Josue Hector, MD;  Location: [place] [facility];  Service: Cardiovascular;   Laterality: N/A;  . CARDIOVERSION N/A 06/17/2013   Procedure: CARDIOVERSION;  Surgeon: Josue Hector, MD;  Location: [place];  Service: Cardiovascular;  Laterality: N/A;  . CHOLECYSTECTOMY  1976  . CLUB FOOT RELEASE  1968  . TONSILLECTOMY  1967     Current Outpatient Prescriptions  Medication Sig Dispense Refill  . amLODipine (NORVASC) 5 MG tablet Take 5 mg by mouth daily.     Marland Kitchen apixaban (ELIQUIS) 5 MG TABS tablet Take 1 tablet (5 mg total) by mouth 2 (two) times daily. 60 tablet 11  . atorvastatin (LIPITOR) 20 MG tablet Take 20 mg by mouth daily.    . Cholecalciferol (VITAMIN D3 PO) Take 1 capsule by mouth daily.    Marland Kitchen co-enzyme Q-10 30 MG capsule Take 30 mg by mouth daily.    . flecainide (TAMBOCOR) 100 MG tablet Take 1 tablet (100 mg total) by mouth 2 (two) times daily. 60 tablet 3  . KLOR-CON M20 20 MEQ tablet Take 20 mEq by mouth 2 (two) times daily.     Marland Kitchen lisinopril-hydrochlorothiazide (PRINZIDE,ZESTORETIC) 20-25 MG per tablet Take 1 tablet by mouth daily.     . magnesium 30 MG tablet Take 30 mg by mouth daily.    . metoprolol (LOPRESSOR) 50 MG tablet Take 1 tablet by mouth 2 (two) times daily.    . TURMERIC PO Take 1 tablet by mouth daily.    Marland Kitchen VITAMIN K PO Take 1 tablet by mouth daily.     No current facility-administered medications for this visit.     Allergies:  Penicillins   Social History:  The patient  reports that he has quit smoking. He has never used smokeless tobacco. He reports that he drinks alcohol. He reports that he does not use drugs.   Family History:  The patient's  family history includes Atrial fibrillation in his brother; CVA in his mother; Congestive Heart Failure in his brother; Hypercholesterolemia in his brother; Hypertension in his brother, father, and mother; Stroke in his mother.    ROS:  Please see the history of present illness.   All other systems are reviewed and negative.    PHYSICAL EXAM: VS:  BP (!) 142/98   Pulse (!) 49   Ht 5'  11" (1.803 m)   Wt 217 lb 6.4 oz (98.6 kg)   BMI 30.32 kg/m  , BMI Body mass index is 30.32 kg/m. GEN: Well nourished, well developed, in no acute distress  HEENT: normal  Neck: no JVD, carotid bruits, or masses Cardiac: RRR; no murmurs, rubs, or gallops,no edema  Respiratory:  clear to auscultation bilaterally, normal work of breathing GI: soft, nontender, nondistended, + BS MS: no deformity or atrophy  Skin: warm and dry  Neuro:  Strength and sensation are intact Psych: euthymic mood, full affect  EKG:  EKG is ordered today. The ekg ordered today shows sinus rhythm 49 bpm, PR 201 msec, QTc 419 msec, otherwise normal ekg   Recent Labs: 08/16/2015: Magnesium 2.3 10/05/2015: BUN 16; Creat 0.90; Hemoglobin 15.4; Platelets 142; Potassium 3.4; Sodium 141    Lipid Panel  No results found for: CHOL, TRIG, HDL, CHOLHDL, VLDL, LDLCALC, LDLDIRECT   Wt Readings from Last 3 Encounters:  11/15/15 217 lb 6.4 oz (98.6 kg)  10/29/15 217 lb 6.4 oz (98.6 kg)  10/14/15 218 lb (98.9 kg)      ASSESSMENT AND PLAN:  1.  Paroxysmal atrial fibrillation and atrial flutter The patient has symptomatic atrial arrhythmias.  He has failed medical therapy with flecainide.  Therapeutic strategies for afib and atrial flutter including medicine and ablation were discussed in detail with the patient today. Risk, benefits, and alternatives to EP study and radiofrequency ablation were also discussed in detail today. These risks include but are not limited to stroke, bleeding, vascular damage, tamponade, perforation, damage to the esophagus, lungs, and other structures, pulmonary vein stenosis, worsening renal function, and death. The patient understands these risk and wishes to proceed.  We will therefore proceed with catheter ablation at the next available time.  Will plan cardiac CT prior to the procedure.  The importance of compliance with anticoagulation was discussed with the patient.  2. HTN Stable No  change required today  3. Reduced EF Likely AF related Given absence of CAD, I would favor continuing flecainide for now.  Reassess EF post ablation.  4. ETOh  Avoidance encouraged   Current medicines are reviewed at length with the patient today.   The patient does not have concerns regarding his medicines.  The following changes were made today:  none   Signed, Thompson Grayer, MD  11/15/2015 9:42 AM     [facility] [address] [place] [address] [phone] (office) [phone] (fax)

## 2015-11-15 NOTE — Patient Instructions (Addendum)
Medication Instructions:  Your physician recommends that you continue on your current medications as directed. Please refer to the Current Medication list given to you today.   Labwork: Your physician recommends that you return for lab work today: BMP/CBC   Testing/Procedures: Your physician has requested that you have cardiac CT. Cardiac computed tomography (CT) is a painless test that uses an x-ray machine to take clear, detailed pictures of your heart. For further information please visit [facility]. Please follow instruction sheet as given.---office will give you date and instructions  Needs to be done week of 11/22/15   Your physician has recommended that you have an ablation. Catheter ablation is a medical procedure used to treat some cardiac arrhythmias (irregular heartbeats). During catheter ablation, a long, thin, flexible tube is put into a blood vessel in your groin (upper thigh), or neck. This tube is called an ablation catheter. It is then guided to your heart through the blood vessel. Radio frequency waves destroy small areas of heart tissue where abnormal heartbeats may cause an arrhythmia to start. Please see the instruction sheet given to you today.11/30/15  Please arrive at the [place] [facility] on 10/10 at 5:30am Do not eat or drink after midnight the night prior to the procedure Do not take any medications the morning of the procedure Plan for one night stay No driving after procedure--will discuss at discharge    Follow-Up: Your physician recommends that you schedule a follow-up appointment in 4 weeks from 11/30/15 with Ceasar Lund and 3 months from 11/30/15 with Dr Rayann Heman   Any Other Special Instructions Will Be Listed Below (If Applicable).     If you need a refill on your cardiac medications before your next appointment, please call your pharmacy.

## 2015-11-16 ENCOUNTER — Encounter: Payer: Self-pay | Admitting: Internal Medicine

## 2015-11-24 ENCOUNTER — Encounter (HOSPITAL_COMMUNITY): Payer: Self-pay

## 2015-11-24 ENCOUNTER — Ambulatory Visit (HOSPITAL_COMMUNITY)
Admission: RE | Admit: 2015-11-24 | Discharge: 2015-11-24 | Disposition: A | Discharge status: Home / Self Care | Payer: BC Managed Care – PPO | Source: Ambulatory Visit | Attending: Internal Medicine | Admitting: Internal Medicine

## 2015-11-24 DIAGNOSIS — R918 Other nonspecific abnormal finding of lung field: Secondary | ICD-10-CM | POA: Diagnosis not present

## 2015-11-24 DIAGNOSIS — I700 Atherosclerosis of aorta: Secondary | ICD-10-CM | POA: Diagnosis not present

## 2015-11-24 DIAGNOSIS — I480 Paroxysmal atrial fibrillation: Secondary | ICD-10-CM | POA: Insufficient documentation

## 2015-11-24 DIAGNOSIS — I2510 Atherosclerotic heart disease of native coronary artery without angina pectoris: Secondary | ICD-10-CM | POA: Diagnosis not present

## 2015-11-24 MED ORDER — IOPAMIDOL (ISOVUE-370) INJECTION 76%
INTRAVENOUS | Status: AC
  Administered 2015-11-24: 80 mL
  Filled 2015-11-24: qty 100

## 2015-11-24 MED ORDER — METOPROLOL TARTRATE 5 MG/5ML IV SOLN
5.0000 mg | Freq: Once | INTRAVENOUS | Status: AC
  Administered 2015-11-24: 2.5 mg via INTRAVENOUS
  Filled 2015-11-24: qty 5

## 2015-11-24 MED ORDER — METOPROLOL TARTRATE 5 MG/5ML IV SOLN
INTRAVENOUS | Status: AC
  Administered 2015-11-24: 2.5 mg via INTRAVENOUS
  Filled 2015-11-24: qty 5

## 2015-11-24 MED ORDER — NITROGLYCERIN 0.4 MG SL SUBL
0.4000 mg | SUBLINGUAL_TABLET | SUBLINGUAL | Status: DC | PRN
  Administered 2015-11-24: 0.4 mg via SUBLINGUAL
  Filled 2015-11-24 (×2): qty 25

## 2015-11-24 MED ORDER — NITROGLYCERIN 0.4 MG SL SUBL
SUBLINGUAL_TABLET | SUBLINGUAL | Status: AC
  Administered 2015-11-24: 0.4 mg via SUBLINGUAL
  Filled 2015-11-24: qty 1

## 2015-11-29 ENCOUNTER — Telehealth (HOSPITAL_COMMUNITY): Payer: Self-pay | Admitting: *Deleted

## 2015-11-29 NOTE — Telephone Encounter (Signed)
Pt cld for instructions for ablation; per AVS from [facility], Nothing NPO from midnight night before.  Arrive 5:30 am at [facility]. No driving after procedure, no meds the morning of procedure. Pt understood instructions.

## 2015-11-30 ENCOUNTER — Ambulatory Visit (HOSPITAL_COMMUNITY)
Admission: RE | Admit: 2015-11-30 | Discharge: 2015-12-01 | Disposition: A | Discharge status: Home / Self Care | Payer: BC Managed Care – PPO | Source: Ambulatory Visit | Attending: Internal Medicine | Admitting: Internal Medicine

## 2015-11-30 ENCOUNTER — Ambulatory Visit (HOSPITAL_COMMUNITY): Payer: BC Managed Care – PPO | Admitting: Certified Registered Nurse Anesthetist

## 2015-11-30 ENCOUNTER — Encounter (HOSPITAL_COMMUNITY): Payer: Self-pay | Admitting: *Deleted

## 2015-11-30 ENCOUNTER — Encounter (HOSPITAL_COMMUNITY)
Admission: RE | Disposition: A | Discharge status: Home / Self Care | Payer: Self-pay | Source: Ambulatory Visit | Attending: Internal Medicine

## 2015-11-30 DIAGNOSIS — Z7901 Long term (current) use of anticoagulants: Secondary | ICD-10-CM | POA: Insufficient documentation

## 2015-11-30 DIAGNOSIS — I480 Paroxysmal atrial fibrillation: Secondary | ICD-10-CM | POA: Insufficient documentation

## 2015-11-30 DIAGNOSIS — Z880 Allergy status to penicillin: Secondary | ICD-10-CM | POA: Insufficient documentation

## 2015-11-30 DIAGNOSIS — E7800 Pure hypercholesterolemia, unspecified: Secondary | ICD-10-CM | POA: Diagnosis not present

## 2015-11-30 DIAGNOSIS — I483 Typical atrial flutter: Secondary | ICD-10-CM | POA: Insufficient documentation

## 2015-11-30 DIAGNOSIS — Z87891 Personal history of nicotine dependence: Secondary | ICD-10-CM | POA: Insufficient documentation

## 2015-11-30 DIAGNOSIS — Z8249 Family history of ischemic heart disease and other diseases of the circulatory system: Secondary | ICD-10-CM | POA: Insufficient documentation

## 2015-11-30 DIAGNOSIS — I4891 Unspecified atrial fibrillation: Secondary | ICD-10-CM | POA: Diagnosis present

## 2015-11-30 DIAGNOSIS — I10 Essential (primary) hypertension: Secondary | ICD-10-CM | POA: Insufficient documentation

## 2015-11-30 HISTORY — PX: ELECTROPHYSIOLOGIC STUDY: SHX172A

## 2015-11-30 LAB — POCT ACTIVATED CLOTTING TIME
Activated Clotting Time: 246 seconds
Activated Clotting Time: 257 seconds
Activated Clotting Time: 307 seconds
Activated Clotting Time: 164 seconds
Activated Clotting Time: 285 seconds

## 2015-11-30 LAB — MRSA PCR SCREENING: MRSA by PCR: NEGATIVE

## 2015-11-30 LAB — POTASSIUM: Potassium: 3.3 mmol/L — ABNORMAL LOW (ref 3.5–5.1)

## 2015-11-30 SURGERY — ATRIAL FIBRILLATION ABLATION
Anesthesia: General

## 2015-11-30 MED ORDER — HYDROCODONE-ACETAMINOPHEN 5-325 MG PO TABS
1.0000 | ORAL_TABLET | ORAL | Status: DC | PRN
  Administered 2015-11-30 (×2): 2 via ORAL
  Filled 2015-11-30 (×2): qty 2

## 2015-11-30 MED ORDER — MIDAZOLAM HCL 5 MG/5ML IJ SOLN
INTRAMUSCULAR | Status: DC | PRN
  Administered 2015-11-30: 2 mg via INTRAVENOUS

## 2015-11-30 MED ORDER — POTASSIUM CHLORIDE CRYS ER 20 MEQ PO TBCR
40.0000 meq | EXTENDED_RELEASE_TABLET | Freq: Once | ORAL | Status: AC
  Administered 2015-11-30: 40 meq via ORAL
  Filled 2015-11-30: qty 2

## 2015-11-30 MED ORDER — SODIUM CHLORIDE 0.9% FLUSH: 3.0000 mL | INTRAVENOUS | Status: DC | PRN

## 2015-11-30 MED ORDER — SODIUM CHLORIDE 0.9% FLUSH
3.0000 mL | Freq: Two times a day (BID) | INTRAVENOUS | Status: DC
  Administered 2015-11-30 (×2): 3 mL via INTRAVENOUS

## 2015-11-30 MED ORDER — POTASSIUM CHLORIDE CRYS ER 20 MEQ PO TBCR
EXTENDED_RELEASE_TABLET | ORAL | Status: AC
  Filled 2015-11-30: qty 2

## 2015-11-30 MED ORDER — IOPAMIDOL (ISOVUE-370) INJECTION 76%
INTRAVENOUS | Status: AC
  Filled 2015-11-30: qty 50

## 2015-11-30 MED ORDER — AMLODIPINE BESYLATE 5 MG PO TABS
5.0000 mg | ORAL_TABLET | Freq: Every day | ORAL | Status: DC
Start: 2015-12-01 — End: 2015-12-01
  Administered 2015-12-01: 5 mg via ORAL
  Filled 2015-11-30: qty 1

## 2015-11-30 MED ORDER — PROTAMINE SULFATE 10 MG/ML IV SOLN
INTRAVENOUS | Status: DC | PRN
  Administered 2015-11-30: 30 mg via INTRAVENOUS

## 2015-11-30 MED ORDER — APIXABAN 5 MG PO TABS
5.0000 mg | ORAL_TABLET | Freq: Two times a day (BID) | ORAL | Status: DC
  Administered 2015-11-30 – 2015-12-01 (×2): 5 mg via ORAL
  Filled 2015-11-30 (×2): qty 1

## 2015-11-30 MED ORDER — METOPROLOL TARTRATE 25 MG PO TABS
25.0000 mg | ORAL_TABLET | Freq: Once | ORAL | Status: AC
  Administered 2015-11-30: 25 mg via ORAL
  Filled 2015-11-30: qty 1

## 2015-11-30 MED ORDER — PHENYLEPHRINE HCL 10 MG/ML IJ SOLN
INTRAMUSCULAR | Status: DC | PRN
  Administered 2015-11-30: 120 ug via INTRAVENOUS

## 2015-11-30 MED ORDER — HEPARIN SODIUM (PORCINE) 1000 UNIT/ML IJ SOLN
INTRAMUSCULAR | Status: DC | PRN
  Administered 2015-11-30: 3000 [IU] via INTRAVENOUS
  Administered 2015-11-30: 1000 [IU] via INTRAVENOUS
  Administered 2015-11-30: 5000 [IU] via INTRAVENOUS
  Administered 2015-11-30: 12000 [IU] via INTRAVENOUS

## 2015-11-30 MED ORDER — ONDANSETRON HCL 4 MG/2ML IJ SOLN: 4.0000 mg | Freq: Four times a day (QID) | INTRAMUSCULAR | Status: DC | PRN

## 2015-11-30 MED ORDER — EPHEDRINE SULFATE 50 MG/ML IJ SOLN
INTRAMUSCULAR | Status: DC | PRN
  Administered 2015-11-30: 15 mg via INTRAVENOUS

## 2015-11-30 MED ORDER — LISINOPRIL 20 MG PO TABS
20.0000 mg | ORAL_TABLET | Freq: Every day | ORAL | Status: DC
  Administered 2015-12-01: 20 mg via ORAL
  Filled 2015-11-30: qty 1

## 2015-11-30 MED ORDER — DEXTROSE 5 % IV SOLN
INTRAVENOUS | Status: DC | PRN
  Administered 2015-11-30: 20 ug/min via INTRAVENOUS

## 2015-11-30 MED ORDER — POTASSIUM CHLORIDE CRYS ER 20 MEQ PO TBCR
40.0000 meq | EXTENDED_RELEASE_TABLET | Freq: Every day | ORAL | Status: DC
  Administered 2015-11-30 – 2015-12-01 (×2): 40 meq via ORAL
  Filled 2015-11-30 (×2): qty 2

## 2015-11-30 MED ORDER — HEPARIN SODIUM (PORCINE) 1000 UNIT/ML IJ SOLN
INTRAMUSCULAR | Status: DC | PRN
  Administered 2015-11-30: 1000 [IU] via INTRAVENOUS

## 2015-11-30 MED ORDER — LIDOCAINE 2% (20 MG/ML) 5 ML SYRINGE
INTRAMUSCULAR | Status: DC | PRN
  Administered 2015-11-30: 40 mg via INTRAVENOUS

## 2015-11-30 MED ORDER — SODIUM CHLORIDE 0.9 % IV SOLN: 250.0000 mL | INTRAVENOUS | Status: DC | PRN

## 2015-11-30 MED ORDER — HEPARIN SODIUM (PORCINE) 1000 UNIT/ML IJ SOLN
INTRAMUSCULAR | Status: AC
  Filled 2015-11-30: qty 1

## 2015-11-30 MED ORDER — FLECAINIDE ACETATE 100 MG PO TABS
100.0000 mg | ORAL_TABLET | Freq: Two times a day (BID) | ORAL | Status: DC
Start: 2015-11-30 — End: 2015-12-01
  Administered 2015-11-30 – 2015-12-01 (×3): 100 mg via ORAL
  Filled 2015-11-30 (×3): qty 1

## 2015-11-30 MED ORDER — BUPIVACAINE HCL (PF) 0.25 % IJ SOLN
INTRAMUSCULAR | Status: DC | PRN
  Administered 2015-11-30: 26 mL

## 2015-11-30 MED ORDER — LISINOPRIL-HYDROCHLOROTHIAZIDE 20-25 MG PO TABS: 1.0000 | ORAL_TABLET | Freq: Every day | ORAL | Status: DC

## 2015-11-30 MED ORDER — SODIUM CHLORIDE 0.9 % IV SOLN
INTRAVENOUS | Status: DC
  Administered 2015-11-30 (×2): via INTRAVENOUS

## 2015-11-30 MED ORDER — PROPOFOL 10 MG/ML IV BOLUS
INTRAVENOUS | Status: DC | PRN
  Administered 2015-11-30: 170 mg via INTRAVENOUS
  Administered 2015-11-30: 20 mg via INTRAVENOUS

## 2015-11-30 MED ORDER — ONDANSETRON HCL 4 MG/2ML IJ SOLN
INTRAMUSCULAR | Status: DC | PRN
  Administered 2015-11-30: 4 mg via INTRAVENOUS

## 2015-11-30 MED ORDER — IOPAMIDOL (ISOVUE-370) INJECTION 76%
INTRAVENOUS | Status: DC | PRN
  Administered 2015-11-30: 3 mL via INTRAVENOUS

## 2015-11-30 MED ORDER — METOPROLOL TARTRATE 50 MG PO TABS
50.0000 mg | ORAL_TABLET | Freq: Two times a day (BID) | ORAL | Status: DC
  Administered 2015-12-01: 50 mg via ORAL
  Filled 2015-11-30: qty 1

## 2015-11-30 MED ORDER — FENTANYL CITRATE (PF) 100 MCG/2ML IJ SOLN
INTRAMUSCULAR | Status: DC | PRN
  Administered 2015-11-30: 25 ug via INTRAVENOUS
  Administered 2015-11-30: 100 ug via INTRAVENOUS
  Administered 2015-11-30: 25 ug via INTRAVENOUS
  Administered 2015-11-30: 50 ug via INTRAVENOUS

## 2015-11-30 MED ORDER — HYDROCHLOROTHIAZIDE 25 MG PO TABS
25.0000 mg | ORAL_TABLET | Freq: Every day | ORAL | Status: DC
  Administered 2015-12-01: 25 mg via ORAL
  Filled 2015-11-30: qty 1

## 2015-11-30 MED ORDER — BUPIVACAINE HCL (PF) 0.25 % IJ SOLN
INTRAMUSCULAR | Status: AC
  Filled 2015-11-30: qty 30

## 2015-11-30 MED ORDER — ACETAMINOPHEN 325 MG PO TABS: 650.0000 mg | ORAL_TABLET | ORAL | Status: DC | PRN

## 2015-11-30 SURGICAL SUPPLY — 18 items
BAG SNAP BAND KOVER 36X36 (MISCELLANEOUS) ×2 IMPLANT
BLANKET WARM UNDERBOD FULL ACC (MISCELLANEOUS) ×2 IMPLANT
CATH NAVISTAR SMARTTOUCH DF (ABLATOR) ×2 IMPLANT
CATH SOUNDSTAR 3D IMAGING (CATHETERS) ×2 IMPLANT
CATH VARIABLE LASSO NAV 2515 (CATHETERS) ×2 IMPLANT
CATH WEBSTER BI DIR CS D-F CRV (CATHETERS) ×2 IMPLANT
COVER SWIFTLINK CONNECTOR (BAG) ×2 IMPLANT
NEEDLE TRANSEP BRK 71CM 407200 (NEEDLE) ×2 IMPLANT
PACK EP LATEX FREE (CUSTOM PROCEDURE TRAY) ×1
PACK EP LF (CUSTOM PROCEDURE TRAY) ×1 IMPLANT
PAD DEFIB LIFELINK (PAD) ×2 IMPLANT
PATCH CARTO3 (PAD) ×2 IMPLANT
SHEATH AVANTI 11F 11CM (SHEATH) ×2 IMPLANT
SHEATH PINNACLE 7F 10CM (SHEATH) ×4 IMPLANT
SHEATH PINNACLE 9F 10CM (SHEATH) ×2 IMPLANT
SHEATH SWARTZ TS SL2 63CM 8.5F (SHEATH) ×2 IMPLANT
SHIELD RADPAD SCOOP 12X17 (MISCELLANEOUS) ×2 IMPLANT
TUBING SMART ABLATE COOLFLOW (TUBING) ×2 IMPLANT

## 2015-11-30 NOTE — Interval H&P Note (Signed)
History and Physical Interval Note:  11/30/2015 7:32 AM  Patrick Gray  has presented today for surgery, with the diagnosis of afib  The various methods of treatment have been discussed with the patient and family. After consideration of risks, benefits and other options for treatment, the patient has consented to  Procedure(s): Atrial Fibrillation Ablation (N/A) as a surgical intervention .  The patient's history has been reviewed, patient examined, no change in status, stable for surgery.  I have reviewed the patient's chart and labs.  Questions were answered to the patient's satisfaction.   PT reports compliance with eliquis without interruption.  Cardiac CT reviewed with patient.  Thompson Grayer

## 2015-11-30 NOTE — Anesthesia Procedure Notes (Signed)
Procedure Name: LMA Insertion Date/Time: 11/30/2015 8:08 AM Performed by: Mervyn Gay Pre-anesthesia Checklist: Patient identified, Patient being monitored, Timeout performed, Emergency Drugs available and Suction available Patient Re-evaluated:Patient Re-evaluated prior to inductionOxygen Delivery Method: Circle System Utilized Preoxygenation: Pre-oxygenation with 100% oxygen Intubation Type: IV induction Ventilation: Mask ventilation without difficulty LMA: LMA with gastric port inserted LMA Size: 5.0 Number of attempts: 1 Placement Confirmation: positive ETCO2 and breath sounds checked- equal and bilateral Tube secured with: Tape Dental Injury: Teeth and Oropharynx as per pre-operative assessment

## 2015-11-30 NOTE — Transfer of Care (Signed)
Immediate Anesthesia Transfer of Care Note  Patient: Patrick Gray  Procedure(s) Performed: Procedure(s): Atrial Fibrillation Ablation (N/A)  Patient Location: PACU  Anesthesia Type:General  Level of Consciousness: awake, alert , oriented and patient cooperative  Airway & Oxygen Therapy: Patient Spontanous Breathing and Patient connected to nasal cannula oxygen  Post-op Assessment: Report given to RN, Post -op Vital signs reviewed and stable and Patient moving all extremities X 4  Post vital signs: Reviewed and stable  Last Vitals:  Vitals:   11/30/15 0533 11/30/15 1107  BP: (!) 161/118 (!) 145/106  Pulse: 74 100  Resp: 18   Temp: 36.8 C 36.6 C    Last Pain:  Vitals:   11/30/15 1107  TempSrc: Temporal         Complications: No apparent anesthesia complications

## 2015-11-30 NOTE — Discharge Summary (Signed)
ELECTROPHYSIOLOGY PROCEDURE DISCHARGE SUMMARY    Patient ID: Patrick Gray,  MRN: [id number], DOB/AGE: [birth year] 60 y.o.  Admit date: 11/30/2015 Discharge date: 12/01/15  Primary Care Physician: Gara Kroner, MD  Primary Cardiologist: Dr. Johnsie Cancel Electrophysiologist: Thompson Grayer, MD  Primary Discharge Diagnosis:  1. PAFib     CHA2DS2Vasc is one, on Eliquis  Secondary Discharge Diagnosis:  1. HTN  Procedures This Admission:  1.  Electrophysiology study and radiofrequency catheter ablation on 11/30/15 by Dr Thompson Grayer.  This study demonstrated  CONCLUSIONS: 1. Sinus rhythm upon presentation.   2. Intracardiac echo reveals a moderate sized left atrium with four separate pulmonary veins without evidence of pulmonary vein stenosis. 3. Successful electrical isolation and anatomical encircling of all four pulmonary veins with radiofrequency current.  Additional ablation performed within the left atrium.   4. Cavo-tricuspid isthmus ablation was performed with complete bidirectional isthmus block achieved.  5. Unsuccessful cardioversion post ablation 6. No early apparent complications.  Brief HPI: Patrick Gray is a 60 y.o. male with a history of paroxysmal atrial fibrillation.  They have failed medical therapy with Flecainide . Risks, benefits, and alternatives to catheter ablation of atrial fibrillation were reviewed with the patient who wished to proceed.  The patient underwent cardiac CT prior to the procedure which demonstrated no LAA thrombus.    Hospital Course:  The patient was admitted and underwent EPS/RFCA of atrial fibrillation with details as outlined above.  They were monitored on telemetry overnight which demonstrated AFib 70-90's.  Groin was without complication on the day of discharge.  The patient was examined by Dr. Rayann Heman and considered to be stable for discharge.  Wound care and restrictions were reviewed with the patient.  The patient will be seen back by Roderic Palau, NP in 1 week, if he will continue his Flecainide/metoprolol, if he remains if AF to be arranged for [facility] and Dr Rayann Heman in 12 weeks for post ablation follow up.    Physical Exam: Vitals:   12/01/15 0500 12/01/15 0600 12/01/15 0700 12/01/15 0813  BP: 130/82 135/80 131/86 108/71  Pulse: 83 91 83 86  Resp: 13 11 17 17   Temp:    98.5 F (36.9 C)  TempSrc:    Oral  SpO2: 94% 94% 95% 96%  Weight:      Height:         GEN- The patient is well appearing, alert and oriented x 3 today.   HEENT: normocephalic, atraumatic; sclera clear, conjunctiva pink; hearing intact; oropharynx clear; neck supple  Lungs- Clear to ausculation bilaterally, normal work of breathing.  No wheezes, rales, rhonchi Heart-IRRR, no murmurs, rubs or gallops  GI- soft, non-tender, non-distended, bowel sounds present  Extremities- no clubbing, cyanosis, or edema; groin without hematoma/bruit MS- no significant deformity or atrophy Skin- warm and dry, no rash or lesion Psych- euthymic mood, full affect Neuro- strength and sensation are intact   Labs:   Lab Results  Component Value Date   WBC 5.8 11/15/2015   HGB 15.9 11/15/2015   HCT 43.8 11/15/2015   MCV 86.9 11/15/2015   PLT 126 (L) 11/15/2015     Recent Labs Lab 11/30/15 0606  K 3.3*     Discharge Medications:    Medication List    TAKE these medications   amLODipine 5 MG tablet Commonly known as:  NORVASC Take 5 mg by mouth daily.   apixaban 5 MG Tabs tablet Commonly known as:  ELIQUIS Take 1 tablet (5 mg total)  by mouth 2 (two) times daily.   atorvastatin 20 MG tablet Commonly known as:  LIPITOR Take 20 mg by mouth daily.   co-enzyme Q-10 30 MG capsule Take 30 mg by mouth daily.   flecainide 100 MG tablet Commonly known as:  TAMBOCOR Take 1 tablet (100 mg total) by mouth 2 (two) times daily.   KLOR-CON M20 20 MEQ tablet Generic drug:  potassium chloride SA Take 40 mEq by mouth daily.   lisinopril-hydrochlorothiazide  20-25 MG tablet Commonly known as:  PRINZIDE,ZESTORETIC Take 1 tablet by mouth daily.   magnesium 30 MG tablet Take 30 mg by mouth daily.   metoprolol 50 MG tablet Commonly known as:  LOPRESSOR Take 1 tablet by mouth 2 (two) times daily.   pantoprazole 40 MG tablet Commonly known as:  PROTONIX Take 1 tablet (40 mg total) by mouth daily.   VITAMIN D3 PO Take 1 capsule by mouth daily.   VITAMIN K PO Take 1 tablet by mouth daily.       Disposition:  Home   Discharge Instructions    Diet - low sodium heart healthy    Complete by:  As directed    Increase activity slowly    Complete by:  As directed      Follow-up Information    [facility] Follow up on 12/08/2015.   Specialty:  Cardiology Why:  2:30PM Contact information: [address] [id number] [place] [phone]          Duration of Discharge Encounter: Greater than 30 minutes including physician time.  SignedTommye Standard, PA-C 12/01/2015 8:49 AM  I have seen, examined the patient, and reviewed the above assessment and plan.  Changes to above are made where necessary.  On exam, iRRR.  Resume home meds.  Return to AF clinic in 1 week.  If still in AF, would anticipate cardioversion at that time.  Co Sign: Thompson Grayer, MD 12/01/2015 8:49 AM

## 2015-11-30 NOTE — H&P (View-Only) (Signed)
Electrophysiology Office Note   Date:  11/15/2015   ID:  Patrick Gray, Patrick Gray 27-Jan-1956, MRN [id number]  PCP:  Gara Kroner, MD  Cardiologist:  Dr Johnsie Cancel Primary Electrophysiologist: Thompson Grayer, MD    Chief Complaint  Patient presents with  . Atrial Fibrillation     History of Present Illness: CHUCK ALAMILLO is a 60 y.o. male who presents today for electrophysiology evaluation.   He reports initially being diagnosed with atrial fibrillation in 2015 after presenting for routine physical examination.  He underwent cardioversion shortly thereafter.  He returned to afib 1 day later.  He was initiated on flecainide and converted to sinus rhythm.  He remained in sinus for about 6 months or more.  He has had episodes of afib related to heavy ETOh or stress.  Episodes increased in frequency and duration and flecainide was recently  Increased to 150mg  BID.  Currently he is taking flecainide 100mg  BID.  He has made significant lifestyle modification (ETOH reduction) and has done "much better" since.  He continues to have afib and atrial flutter.  He reports significant symptoms when in afib.  Today, he denies symptoms of palpitations, chest pain, shortness of breath, orthopnea, PND, lower extremity edema, claudication, dizziness, presyncope, syncope, bleeding, or neurologic sequela. The patient is tolerating medications without difficulties and is otherwise without complaint today.    Past Medical History:  Diagnosis Date  . Deafness in left ear   . HTN (hypertension)   . Hypercholesterolemia   . Hypopotassemia   . Paroxysmal atrial fibrillation (HCC)        Atrial flutter (typical)   Past Surgical History:  Procedure Laterality Date  . ACOUSTIC NEUROMA RESECTION Left 19990  . APPENDECTOMY  1976  . CARDIAC CATHETERIZATION N/A 10/07/2015   Procedure: Left Heart Cath and Coronary Angiography;  Surgeon: Josue Hector, MD;  Location: [facility];  Service: Cardiovascular;   Laterality: N/A;  . CARDIOVERSION N/A 06/17/2013   Procedure: CARDIOVERSION;  Surgeon: Josue Hector, MD;  Location: [facility];  Service: Cardiovascular;  Laterality: N/A;  . CHOLECYSTECTOMY  1976  . CLUB FOOT RELEASE  1968  . TONSILLECTOMY  1967     Current Outpatient Prescriptions  Medication Sig Dispense Refill  . amLODipine (NORVASC) 5 MG tablet Take 5 mg by mouth daily.     Marland Kitchen apixaban (ELIQUIS) 5 MG TABS tablet Take 1 tablet (5 mg total) by mouth 2 (two) times daily. 60 tablet 11  . atorvastatin (LIPITOR) 20 MG tablet Take 20 mg by mouth daily.    . Cholecalciferol (VITAMIN D3 PO) Take 1 capsule by mouth daily.    Marland Kitchen co-enzyme Q-10 30 MG capsule Take 30 mg by mouth daily.    . flecainide (TAMBOCOR) 100 MG tablet Take 1 tablet (100 mg total) by mouth 2 (two) times daily. 60 tablet 3  . KLOR-CON M20 20 MEQ tablet Take 20 mEq by mouth 2 (two) times daily.     Marland Kitchen lisinopril-hydrochlorothiazide (PRINZIDE,ZESTORETIC) 20-25 MG per tablet Take 1 tablet by mouth daily.     . magnesium 30 MG tablet Take 30 mg by mouth daily.    . metoprolol (LOPRESSOR) 50 MG tablet Take 1 tablet by mouth 2 (two) times daily.    . TURMERIC PO Take 1 tablet by mouth daily.    Marland Kitchen VITAMIN K PO Take 1 tablet by mouth daily.     No current facility-administered medications for this visit.     Allergies:  Penicillins   Social History:  The patient  reports that he has quit smoking. He has never used smokeless tobacco. He reports that he drinks alcohol. He reports that he does not use drugs.   Family History:  The patient's  family history includes Atrial fibrillation in his brother; CVA in his mother; Congestive Heart Failure in his brother; Hypercholesterolemia in his brother; Hypertension in his brother, father, and mother; Stroke in his mother.    ROS:  Please see the history of present illness.   All other systems are reviewed and negative.    PHYSICAL EXAM: VS:  BP (!) 142/98   Pulse (!) 49   Ht 5'  11" (1.803 m)   Wt 217 lb 6.4 oz (98.6 kg)   BMI 30.32 kg/m  , BMI Body mass index is 30.32 kg/m. GEN: Well nourished, well developed, in no acute distress  HEENT: normal  Neck: no JVD, carotid bruits, or masses Cardiac: RRR; no murmurs, rubs, or gallops,no edema  Respiratory:  clear to auscultation bilaterally, normal work of breathing GI: soft, nontender, nondistended, + BS MS: no deformity or atrophy  Skin: warm and dry  Neuro:  Strength and sensation are intact Psych: euthymic mood, full affect  EKG:  EKG is ordered today. The ekg ordered today shows sinus rhythm 49 bpm, PR 201 msec, QTc 419 msec, otherwise normal ekg   Recent Labs: 08/16/2015: Magnesium 2.3 10/05/2015: BUN 16; Creat 0.90; Hemoglobin 15.4; Platelets 142; Potassium 3.4; Sodium 141    Lipid Panel  No results found for: CHOL, TRIG, HDL, CHOLHDL, VLDL, LDLCALC, LDLDIRECT   Wt Readings from Last 3 Encounters:  11/15/15 217 lb 6.4 oz (98.6 kg)  10/29/15 217 lb 6.4 oz (98.6 kg)  10/14/15 218 lb (98.9 kg)      ASSESSMENT AND PLAN:  1.  Paroxysmal atrial fibrillation and atrial flutter The patient has symptomatic atrial arrhythmias.  He has failed medical therapy with flecainide.  Therapeutic strategies for afib and atrial flutter including medicine and ablation were discussed in detail with the patient today. Risk, benefits, and alternatives to EP study and radiofrequency ablation were also discussed in detail today. These risks include but are not limited to stroke, bleeding, vascular damage, tamponade, perforation, damage to the esophagus, lungs, and other structures, pulmonary vein stenosis, worsening renal function, and death. The patient understands these risk and wishes to proceed.  We will therefore proceed with catheter ablation at the next available time.  Will plan cardiac CT prior to the procedure.  The importance of compliance with anticoagulation was discussed with the patient.  2. HTN Stable No  change required today  3. Reduced EF Likely AF related Given absence of CAD, I would favor continuing flecainide for now.  Reassess EF post ablation.  4. ETOh  Avoidance encouraged   Current medicines are reviewed at length with the patient today.   The patient does not have concerns regarding his medicines.  The following changes were made today:  none   Signed, Thompson Grayer, MD  11/15/2015 9:42 AM     [facility] [address] [place] [address] [phone] (office) [phone] (fax)

## 2015-11-30 NOTE — Anesthesia Preprocedure Evaluation (Addendum)
Anesthesia Evaluation  Patient identified by MRN, date of birth, ID band Patient awake    Reviewed: Allergy & Precautions, NPO status , reviewed documented beta blocker date and time   History of Anesthesia Complications Negative for: history of anesthetic complications  Airway Mallampati: II   Neck ROM: Full    Dental  (+) Dental Advisory Given, Caps, Teeth Intact,    Pulmonary neg pulmonary ROS, former smoker,    breath sounds clear to auscultation       Cardiovascular Exercise Tolerance: Good hypertension, Pt. on medications and Pt. on home beta blockers + dysrhythmias Atrial Fibrillation  Rhythm:Irregular Rate:Normal     Neuro/Psych negative neurological ROS  negative psych ROS   GI/Hepatic negative GI ROS, Neg liver ROS, neg GERD  ,  Endo/Other  negative endocrine ROS  Renal/GU negative Renal ROS  negative genitourinary   Musculoskeletal  (+) Arthritis ,   Abdominal (+) + obese,  Abdomen: soft. Bowel sounds: normal.  Peds negative pediatric ROS (+)  Hematology negative hematology ROS (+)   Anesthesia Other Findings Acoustic neuroma, deaf in left ear  Reproductive/Obstetrics negative OB ROS                            BP Readings from Last 3 Encounters:  11/30/15 (!) 161/118  11/24/15 (!) 152/108  11/15/15 (!) 142/98   Lab Results  Component Value Date   WBC 5.8 11/15/2015   HGB 15.9 11/15/2015   HCT 43.8 11/15/2015   MCV 86.9 11/15/2015   PLT 126 (L) 11/15/2015     Chemistry      Component Value Date/Time   NA 142 11/15/2015 1009   K 3.3 (L) 11/30/2015 0606   CL 103 11/15/2015 1009   CO2 30 11/15/2015 1009   BUN 17 11/15/2015 1009   CREATININE 0.87 11/15/2015 1009      Component Value Date/Time   CALCIUM 9.8 11/15/2015 1009      Anesthesia Physical Anesthesia Plan  ASA: III  Anesthesia Plan: General   Post-op Pain Management:    Induction:  Intravenous  Airway Management Planned: LMA  Additional Equipment:   Intra-op Plan:   Post-operative Plan: Extubation in OR  Informed Consent: I have reviewed the patients History and Physical, chart, labs and discussed the procedure including the risks, benefits and alternatives for the proposed anesthesia with the patient or authorized representative who has indicated his/her understanding and acceptance.   Dental advisory given  Plan Discussed with: Anesthesiologist and CRNA  Anesthesia Plan Comments:        Anesthesia Quick Evaluation

## 2015-11-30 NOTE — Discharge Instructions (Signed)
No driving for 1 week. No lifting over 5 lbs for 1 week. No vigorous or sexual activity for 1 week. You may return to work on 12/07/15. Keep procedure site clean & dry. If you notice increased pain, swelling, bleeding or pus, call/return!  You may shower, but no soaking baths/hot tubs/pools for 1 week.      You have an appointment set up with the [facility].  Multiple studies have shown that being followed by a dedicated atrial fibrillation clinic in addition to the standard care you receive from your other physicians improves health. We believe that enrollment in the atrial fibrillation clinic will allow [place] to better care for you.   The phone number to the [facility] is [phone]. The clinic is staffed Monday through Friday from 8:30am to 5pm.  Parking Directions: The clinic is located in the Heart and Vascular Building connected to [facility]. 1)From [address] turn on to [address] and go to the 3rd entrance  (Heart and Vascular entrance) on the right. 2)Look to the right for Heart &Vascular Parking Garage. 3)A code for the entrance is required please call the clinic to receive this.   4)Take the elevators to the 1st floor. Registration is in the room with the glass walls at the end of the hallway.  If you have any trouble parking or locating the clinic, please dont hesitate to call [phone].

## 2015-11-30 NOTE — Progress Notes (Signed)
Called and spoke with MD Bensimhon regarding patient's scheduled lopressor 50 mg. Patient's blood pressure 104/69, patient states blood pressure normally 120/70's.  Patient is requesting pain medication at this time for chronic right shoulder pain and pain at groin site.  When patient previously received pain medication, SBP dropped in to the 90's. MD Bensimhon advised to give patient 25 mg of Lopressor.

## 2015-11-30 NOTE — Progress Notes (Signed)
7, 9, 11Fr sheaths aspirated and removed from [place]. Manual pressure applied for 30 minutes. Groin level 0 . Tegaderm applied. Groin rebled, manual pressure reapplied for 20 mintues. Groin level 0. No S+S of hematoma. Right groin redressed with tegaderm. Bedrest instructions given. Bilateral DP and PT pulses palpable.  Bedrest begins at 12:20:00

## 2015-12-01 ENCOUNTER — Encounter (HOSPITAL_COMMUNITY): Payer: Self-pay | Admitting: Internal Medicine

## 2015-12-01 DIAGNOSIS — E7800 Pure hypercholesterolemia, unspecified: Secondary | ICD-10-CM | POA: Diagnosis not present

## 2015-12-01 DIAGNOSIS — I10 Essential (primary) hypertension: Secondary | ICD-10-CM | POA: Diagnosis not present

## 2015-12-01 DIAGNOSIS — I483 Typical atrial flutter: Secondary | ICD-10-CM | POA: Diagnosis not present

## 2015-12-01 DIAGNOSIS — I480 Paroxysmal atrial fibrillation: Secondary | ICD-10-CM

## 2015-12-01 MED ORDER — PANTOPRAZOLE SODIUM 40 MG PO TBEC: 40.0000 mg | DELAYED_RELEASE_TABLET | Freq: Every day | ORAL | 0 refills | Status: DC

## 2015-12-01 NOTE — Progress Notes (Signed)
Patient discharge instructions reviewed with patient. IV removed, telemetry taken off and CCDM called. Wife is in route to pick up patient. Will complete discharge at that time. No questions or concerns. Follow up appointments given to patient.

## 2015-12-01 NOTE — Care Management Note (Signed)
Case Management Note  Patient Details  Name: Patrick Gray MRN: [id number] Date of Birth: October 09, 1955  Subjective/Objective:    Afib, ablation                Action/Plan: Discharge Planning: AVS reviewed: Chart reviewed. Insurance covers medications.   PCPMoreen Fowler, DAVID MD  Expected Discharge Date:  12/01/2015              Expected Discharge Plan:  Home/Self Care  In-House Referral:  NA  Discharge planning Services  CM Consult  Post Acute Care Choice:  NA Choice offered to:  NA  DME Arranged:  N/A DME Agency:  NA  HH Arranged:  NA HH Agency:  NA  Status of Service:  Completed, signed off  If discussed at [place] of Stay Meetings, dates discussed:    Additional Comments:  Erenest Rasher, RN 12/01/2015, 9:51 AM

## 2015-12-02 NOTE — Anesthesia Postprocedure Evaluation (Signed)
Anesthesia Post Note  Patient: Patrick Gray  Procedure(s) Performed: Procedure(s) (LRB): Atrial Fibrillation Ablation (N/A)  Patient location during evaluation: Cath Lab Anesthesia Type: General Level of consciousness: awake, awake and alert and oriented Pain management: pain level controlled Vital Signs Assessment: post-procedure vital signs reviewed and stable Respiratory status: spontaneous breathing, nonlabored ventilation and respiratory function stable Cardiovascular status: blood pressure returned to baseline Anesthetic complications: no    Last Vitals:  Vitals:   12/01/15 0700 12/01/15 0813  BP: 131/86 108/71  Pulse: 83 86  Resp: 17 17  Temp:  36.9 C    Last Pain:  Vitals:   12/01/15 0813  TempSrc: Oral  PainSc:                  Eugina Row COKER

## 2015-12-08 ENCOUNTER — Ambulatory Visit (HOSPITAL_COMMUNITY)
Admission: RE | Admit: 2015-12-08 | Discharge: 2015-12-08 | Disposition: A | Discharge status: Home / Self Care | Payer: BC Managed Care – PPO | Source: Ambulatory Visit | Attending: Nurse Practitioner | Admitting: Nurse Practitioner

## 2015-12-08 ENCOUNTER — Encounter (HOSPITAL_COMMUNITY): Payer: Self-pay | Admitting: Nurse Practitioner

## 2015-12-08 VITALS — BP 122/84 | Ht 71.0 in | Wt 214.2 lb

## 2015-12-08 DIAGNOSIS — Z9889 Other specified postprocedural states: Secondary | ICD-10-CM | POA: Diagnosis not present

## 2015-12-08 DIAGNOSIS — Z79899 Other long term (current) drug therapy: Secondary | ICD-10-CM | POA: Insufficient documentation

## 2015-12-08 DIAGNOSIS — Z880 Allergy status to penicillin: Secondary | ICD-10-CM | POA: Diagnosis not present

## 2015-12-08 DIAGNOSIS — I10 Essential (primary) hypertension: Secondary | ICD-10-CM | POA: Insufficient documentation

## 2015-12-08 DIAGNOSIS — E7800 Pure hypercholesterolemia, unspecified: Secondary | ICD-10-CM | POA: Insufficient documentation

## 2015-12-08 DIAGNOSIS — Z8679 Personal history of other diseases of the circulatory system: Secondary | ICD-10-CM

## 2015-12-08 DIAGNOSIS — Z7901 Long term (current) use of anticoagulants: Secondary | ICD-10-CM | POA: Diagnosis not present

## 2015-12-08 DIAGNOSIS — I480 Paroxysmal atrial fibrillation: Secondary | ICD-10-CM | POA: Insufficient documentation

## 2015-12-08 NOTE — Progress Notes (Signed)
Patient ID: Patrick Gray, male   DOB: 22-Sep-1955, 60 y.o.   MRN: [id number]     Primary Care Physician: Gara Kroner, MD Referring Physician: Mahamed Lukose is a 60 y.o. male with a h/o afib 2015 found by primary. Was placed on flecainide 100 mg bid and has done well until seen 6/20 and was noted to be in aflutter with RVR. Has noted some increase in irregular heart beat over the last few weeks. Flecainide was increased to 150 mg bid by Dr. Johnsie Cancel, 6/20, and asked to f/u here. EKG now shows SR, but he feels  fatigue. Wife states he does snore, has daytime somnolence and he drinks at least two beers a night. Minimal caffeine, no smoking. Exercises at the gym and has lost some weight but would like to lose 20 lbs more. Chadsvasc score is 1 for HTN, has been on blood thinner for 2 years, compliant without bleeding issues.Has an older brother and mother with afib, brother has had ablation. Flecainide level was checked on 150 mg bid and was in range. Echo ordered.  Follows up 7/13 and states has really been working on his lifestyle. Admits was drinking a case of beer a week and has only had two beers in two weeks. Since he has been drinking less, he is sleeping better and wife reports less snoring. He feels so much better and has great energy now and is going to there gym more. Echo did return with EF of 45-50%,  EF was 55-60% in 2015. Continues in SR, no afib episodes.  Being seeing in afib clinic due to pt c/o afib for last 10 days. Since I saw pt last, he had abnormal tress test and subsequent catherization and had insignificant 30% disease of RCA, EF was normal. He was seen in f/u by Dr. Rayann Heman and was having very little afib burden, with lifestyle changes, and it was decided that he would stay on flecainide.But Dr. Rayann Heman did mention that if afib returned he would consider ablation. Pt given option of having cardioversion and /or seeing Dr. Rayann Heman back for possible ablation. He tells me that he  has returned to SR after 2-3 weeks of being in afib and he would like to avoid DCCV and get appointment with Dr. Rayann Heman. He does have more fatigue with afib but still can continue his usual activities.  Returns to afib clinic 10/17 after having ablation 10/10. He was d/c in afib and he did go back to rhythm 10/13. Noticed another small episode a few days later but since  then has been in SR. No groin pain, no swallowing activities. He is looking forward to increasing his activities.   Today, he denies symptoms of palpitations, chest pain, shortness of breath, orthopnea, PND, lower extremity edema, dizziness, presyncope, syncope, or neurologic sequela. The patient is tolerating medications without difficulties and is otherwise without complaint today.   Past Medical History:  Diagnosis Date  . Deafness in left ear   . HTN (hypertension)   . Hypercholesterolemia   . Hypopotassemia   . Paroxysmal atrial fibrillation [facility])    Past Surgical History:  Procedure Laterality Date  . ACOUSTIC NEUROMA RESECTION Left 19990  . APPENDECTOMY  1976  . CARDIAC CATHETERIZATION N/A 10/07/2015   Procedure: Left Heart Cath and Coronary Angiography;  Surgeon: Josue Hector, MD;  Location: [place] [facility];  Service: Cardiovascular;  Laterality: N/A;  . CARDIOVERSION N/A 06/17/2013   Procedure: CARDIOVERSION;  Surgeon: Wallis Bamberg  Johnsie Cancel, MD;  Location: [place] [facility];  Service: Cardiovascular;  Laterality: N/A;  . CHOLECYSTECTOMY  1976  . CLUB FOOT RELEASE  1968  . ELECTROPHYSIOLOGIC STUDY N/A 11/30/2015   Procedure: Atrial Fibrillation Ablation;  Surgeon: Thompson Grayer, MD;  Location: [place] [facility];  Service: Cardiovascular;  Laterality: N/A;  . TONSILLECTOMY  1967    Current Outpatient Prescriptions  Medication Sig Dispense Refill  . amLODipine (NORVASC) 5 MG tablet Take 5 mg by mouth daily.     Marland Kitchen apixaban (ELIQUIS) 5 MG TABS tablet Take 1 tablet (5 mg total) by mouth 2 (two) times daily. 60 tablet 11  .  atorvastatin (LIPITOR) 20 MG tablet Take 20 mg by mouth daily.    . flecainide (TAMBOCOR) 100 MG tablet Take 1 tablet (100 mg total) by mouth 2 (two) times daily. 60 tablet 3  . KLOR-CON M20 20 MEQ tablet Take 40 mEq by mouth daily.     Marland Kitchen lisinopril-hydrochlorothiazide (PRINZIDE,ZESTORETIC) 20-25 MG per tablet Take 1 tablet by mouth daily.     . magnesium 30 MG tablet Take 30 mg by mouth daily.    . metoprolol (LOPRESSOR) 50 MG tablet Take 1 tablet by mouth 2 (two) times daily.    . pantoprazole (PROTONIX) 40 MG tablet Take 1 tablet (40 mg total) by mouth daily. 45 tablet 0  . VITAMIN K PO Take 1 tablet by mouth daily.    . Cholecalciferol (VITAMIN D3 PO) Take 1 capsule by mouth daily.    Marland Kitchen co-enzyme Q-10 30 MG capsule Take 30 mg by mouth daily.     No current facility-administered medications for this encounter.     Allergies  Allergen Reactions  . Penicillins     Rash as a Child Has patient had a PCN reaction causing immediate rash, facial/tongue/throat swelling, SOB or lightheadedness with hypotension: No Has patient had a PCN reaction causing severe rash involving mucus membranes or skin necrosis: No Has patient had a PCN reaction that required hospitalization No Has patient had a PCN reaction occurring within the last 10 years: No If all of the above answers are "NO", then may proceed with Cephalosporin use.     Social History   Social History  . Marital status: Married    Spouse name: N/A  . Number of children: N/A  . Years of education: N/A   Occupational History  . Not on file.   Social History Main Topics  . Smoking status: Former Research scientist (life sciences)  . Smokeless tobacco: Never Used  . Alcohol use Yes     Comment: previously 20 beers per week, now 2 beers per week  . Drug use: No  . Sexual activity: Not on file   Other Topics Concern  . Not on file   Social History Narrative   Lives in [place] wife.  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    Family History    Problem Relation Age of Onset  . Hypertension Father   . Hypertension Mother   . Stroke Mother   . CVA Mother   . Hypertension Brother   . Hypercholesterolemia Brother   . Atrial fibrillation Brother   . Congestive Heart Failure Brother     ROS- All systems are reviewed and negative except as per the HPI above  Physical Exam: Vitals:   12/08/15 1431  BP: 122/84  Weight: 214 lb 3.2 oz (97.2 kg)  Height: 5\' 11"  (1.803 m)    GEN- The patient is well appearing, alert and oriented x 3  today.   Head- normocephalic, atraumatic Eyes-  Sclera clear, conjunctiva pink Ears- hearing intact Oropharynx- clear Neck- supple, no JVP Lymph- no cervical lymphadenopathy Lungs- Clear to ausculation bilaterally, normal work of breathing Heart -regular rate and rhythm, no murmurs, rubs or gallops, PMI not laterally displaced GI- soft, NT, ND, + BS Extremities- no clubbing, cyanosis, or edema MS- no significant deformity or atrophy Skin- no rash or lesion Psych- euthymic mood, full affect Neuro- strength and sensation are intact  EKG- SR with first degree AV block, LAD, pr int 256 ms, qrs int 116 ms, qtc 478 ms Epic records reviewed Echo 6-17-.- Left ventricle: The cavity size was moderately dilated. Wall  thickness was increased in a pattern of mild LVH. Systolic  function was mildly reduced. The estimated ejection fraction was  in the range of 45% to 50%. Diffuse hypokinesis. - Aortic valve: There was trivial regurgitation. - Left atrium: The atrium was moderately dilated. 47 mm  Impressions:  - Mild global reduction in LV function; moderate LVE; mild LVH;   trace AI; moderate LAE.  10/07/15-Cardiac catherization-Prox RCA lesion, 30 %stenosed.  RPDA lesion, 30 %stenosed.   Normal right dominant coronary arteries with false positive myovue EF 65% Only 30% proximal/mid and PLA lesions seen  Assessment and Plan: 1.  Paroxymal atrial fibrillation s/p afib ablation 10/10 In  SR, no apparent complications s/p procedure Continue flecainide 100 mg bid Continue metoprolol 50 mg bid Continue apixaban 5 mg bid for chadsvasc of 2 (htn, insignificant cad) Can start returning to normal activities  F/u with Dr. Rayann Heman in 3 months F/u in afib clinic 11/7  Butch Penny C. Parisha Beaulac, [facility] [address] [place], [place] [address] [phone]

## 2015-12-11 ENCOUNTER — Other Ambulatory Visit: Payer: Self-pay | Admitting: Family Medicine

## 2015-12-11 DIAGNOSIS — M25512 Pain in left shoulder: Secondary | ICD-10-CM

## 2015-12-23 ENCOUNTER — Ambulatory Visit
Admission: RE | Admit: 2015-12-23 | Discharge: 2015-12-23 | Disposition: A | Discharge status: Home / Self Care | Payer: BC Managed Care – PPO | Source: Ambulatory Visit | Attending: Family Medicine | Admitting: Family Medicine

## 2015-12-23 DIAGNOSIS — M25512 Pain in left shoulder: Secondary | ICD-10-CM

## 2015-12-28 ENCOUNTER — Ambulatory Visit (HOSPITAL_COMMUNITY)
Admission: RE | Admit: 2015-12-28 | Discharge: 2015-12-28 | Disposition: A | Discharge status: Home / Self Care | Payer: BC Managed Care – PPO | Source: Ambulatory Visit | Attending: Nurse Practitioner | Admitting: Nurse Practitioner

## 2015-12-28 ENCOUNTER — Encounter (HOSPITAL_COMMUNITY): Payer: Self-pay | Admitting: Nurse Practitioner

## 2015-12-28 VITALS — BP 136/86 | HR 51 | Ht 71.0 in | Wt 216.0 lb

## 2015-12-28 DIAGNOSIS — Z880 Allergy status to penicillin: Secondary | ICD-10-CM | POA: Insufficient documentation

## 2015-12-28 DIAGNOSIS — Z79899 Other long term (current) drug therapy: Secondary | ICD-10-CM | POA: Insufficient documentation

## 2015-12-28 DIAGNOSIS — I10 Essential (primary) hypertension: Secondary | ICD-10-CM | POA: Insufficient documentation

## 2015-12-28 DIAGNOSIS — I2510 Atherosclerotic heart disease of native coronary artery without angina pectoris: Secondary | ICD-10-CM | POA: Diagnosis not present

## 2015-12-28 DIAGNOSIS — Z7901 Long term (current) use of anticoagulants: Secondary | ICD-10-CM | POA: Insufficient documentation

## 2015-12-28 DIAGNOSIS — I480 Paroxysmal atrial fibrillation: Secondary | ICD-10-CM | POA: Diagnosis not present

## 2015-12-28 DIAGNOSIS — E7800 Pure hypercholesterolemia, unspecified: Secondary | ICD-10-CM | POA: Insufficient documentation

## 2015-12-28 NOTE — Progress Notes (Signed)
Patient ID: Patrick Gray, male   DOB: 1955-03-01, 60 y.o.   MRN: [id number]     Primary Care Physician: Patrick Kroner, Gray Referring Physician: Montero Patrick Gray is a 61 y.o. male with a h/o afib 2015 found by primary. Was placed on flecainide 100 mg bid and has done well until seen 6/20 and was noted to be in aflutter with RVR. Has noted some increase in irregular heart beat over the last few weeks. Flecainide was increased to 150 mg bid by Patrick Gray, 6/20, and asked to f/u here. EKG now shows SR, but he feels  fatigue. Wife states he does snore, has daytime somnolence and he drinks at least two beers a night. Minimal caffeine, no smoking. Exercises at the gym and has lost some weight but would like to lose 20 lbs more. Chadsvasc score is 1 for HTN, has been on blood thinner for 2 years, compliant without bleeding issues.Has an older brother and mother with afib, brother has had ablation. Flecainide level was checked on 150 mg bid and was in range. Echo ordered.  Follows up 7/13 and states has really been working on his lifestyle. Admits was drinking a case of beer a week and has only had two beers in two weeks. Since he has been drinking less, he is sleeping better and wife reports less snoring. He feels so much better and has great energy now and is going to there gym more. Echo did return with EF of 45-50%,  EF was 55-60% in 2015. Continues in SR, no afib episodes.  Being seeing in afib clinic due to pt c/o afib for last 10 days. Since I saw pt last, he had abnormal tress test and subsequent catherization and had insignificant 30% disease of RCA, EF was normal. He was seen in f/u by Patrick Gray and was having very little afib burden, with lifestyle changes, and it was decided that he would stay on flecainide.But Patrick Gray did mention that if afib returned he would consider ablation. Pt given option of having cardioversion and /or seeing Patrick Gray back for possible ablation. He tells me that he  has returned to SR after 2-3 weeks of being in afib and he would like to avoid DCCV and get appointment with Patrick Gray. He does have more fatigue with afib but still can continue his usual activities.  Returns to afib clinic 10/17 after having ablation 10/10. He was d/c in afib and he did go back to rhythm 10/13. Noticed another small episode a few days later but since  then has been in SR. No groin pain, no swallowing activities. He is looking forward to increasing his activities.   F/u one month, afib ablation. He is in SR. He is doing well, has not noticed any afib. No dysphagia or groin issues. He is needing surgery on his left rotator cuff and it was explained that this would have to be on hold until 3 months after the procedure, so as not to interrupt  Blood thinner following ablation.  Today, he denies symptoms of palpitations, chest pain, shortness of breath, orthopnea, PND, lower extremity edema, dizziness, presyncope, syncope, or neurologic sequela. The patient is tolerating medications without difficulties and is otherwise without complaint today.   Past Medical History:  Diagnosis Date  . Deafness in left ear   . HTN (hypertension)   . Hypercholesterolemia   . Hypopotassemia   . Paroxysmal atrial fibrillation [facility])    Past Surgical History:  Procedure Laterality Date  . ACOUSTIC NEUROMA RESECTION Left 19990  . APPENDECTOMY  1976  . CARDIAC CATHETERIZATION N/A 10/07/2015   Procedure: Left Heart Cath and Coronary Angiography;  Surgeon: Patrick Gray;  Location: [place] CV [facility];  Service: Cardiovascular;  Laterality: N/A;  . CARDIOVERSION N/A 06/17/2013   Procedure: CARDIOVERSION;  Surgeon: Patrick Gray;  Location: [place];  Service: Cardiovascular;  Laterality: N/A;  . CHOLECYSTECTOMY  1976  . CLUB FOOT RELEASE  1968  . ELECTROPHYSIOLOGIC STUDY N/A 11/30/2015   Procedure: Atrial Fibrillation Ablation;  Surgeon: Patrick Gray;  Location: [place] [facility];   Service: Cardiovascular;  Laterality: N/A;  . TONSILLECTOMY  1967    Current Outpatient Prescriptions  Medication Sig Dispense Refill  . amLODipine (NORVASC) 5 MG tablet Take 5 mg by mouth daily.     Marland Kitchen apixaban (ELIQUIS) 5 MG TABS tablet Take 1 tablet (5 mg total) by mouth 2 (two) times daily. 60 tablet 11  . atorvastatin (LIPITOR) 20 MG tablet Take 20 mg by mouth daily.    . Cholecalciferol (VITAMIN D3 PO) Take 1 capsule by mouth daily.    Marland Kitchen co-enzyme Q-10 30 MG capsule Take 30 mg by mouth daily.    . flecainide (TAMBOCOR) 100 MG tablet Take 1 tablet (100 mg total) by mouth 2 (two) times daily. 60 tablet 3  . KLOR-CON M20 20 MEQ tablet Take 40 mEq by mouth daily.     Marland Kitchen lisinopril-hydrochlorothiazide (PRINZIDE,ZESTORETIC) 20-25 MG per tablet Take 1 tablet by mouth daily.     . magnesium 30 MG tablet Take 30 mg by mouth daily.    . metoprolol (LOPRESSOR) 50 MG tablet Take 1 tablet by mouth 2 (two) times daily.    . pantoprazole (PROTONIX) 40 MG tablet Take 1 tablet (40 mg total) by mouth daily. 45 tablet 0  . VITAMIN K PO Take 1 tablet by mouth daily.     No current facility-administered medications for this encounter.     Allergies  Allergen Reactions  . Penicillins     Rash as a Child Has patient had a PCN reaction causing immediate rash, facial/tongue/throat swelling, SOB or lightheadedness with hypotension: No Has patient had a PCN reaction causing severe rash involving mucus membranes or skin necrosis: No Has patient had a PCN reaction that required hospitalization No Has patient had a PCN reaction occurring within the last 10 years: No If all of the above answers are "NO", then may proceed with Cephalosporin use.     Social History   Social History  . Marital status: Married    Spouse name: N/A  . Number of children: N/A  . Years of education: N/A   Occupational History  . Not on file.   Social History Main Topics  . Smoking status: Former Research scientist (life sciences)  . Smokeless  tobacco: Never Used  . Alcohol use Yes     Comment: previously 20 beers per week, now 2 beers per week  . Drug use: No  . Sexual activity: Not on file   Other Topics Concern  . Not on file   Social History Narrative   Lives in [place] wife.  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    Family History  Problem Relation Age of Onset  . Hypertension Father   . Hypertension Mother   . Stroke Mother   . CVA Mother   . Hypertension Brother   . Hypercholesterolemia Brother   . Atrial fibrillation Brother   .  Congestive Heart Failure Brother     ROS- All systems are reviewed and negative except as per the HPI above  Physical Exam: Vitals:   12/28/15 1130  BP: 136/86  Pulse: (!) 51  Weight: 216 lb (98 kg)  Height: 5\' 11"  (1.803 m)    GEN- The patient is well appearing, alert and oriented x 3 today.   Head- normocephalic, atraumatic Eyes-  Sclera clear, conjunctiva pink Ears- hearing intact Oropharynx- clear Neck- supple, no JVP Lymph- no cervical lymphadenopathy Lungs- Clear to ausculation bilaterally, normal work of breathing Heart -regular rate and rhythm, no murmurs, rubs or gallops, PMI not laterally displaced GI- soft, NT, ND, + BS Extremities- no clubbing, cyanosis, or edema MS- no significant deformity or atrophy Skin- no rash or lesion Psych- euthymic mood, full affect Neuro- strength and sensation are intact  EKG- Sinus brady at 51 bpm,  LAD, pr int 198 ms, qrs int 98 ms, qtc 429 ms Epic records reviewed Echo 6-17-.- Left ventricle: The cavity size was moderately dilated. Wall  thickness was increased in a pattern of mild LVH. Systolic  function was mildly reduced. The estimated ejection fraction was  in the range of 45% to 50%. Diffuse hypokinesis. - Aortic valve: There was trivial regurgitation. - Left atrium: The atrium was moderately dilated. 47 mm  Impressions:  - Mild global reduction in LV function; moderate LVE; mild LVH;   trace AI;  moderate LAE.  10/07/15-Cardiac catherization-Prox RCA lesion, 30 %stenosed.  RPDA lesion, 30 %stenosed.   Normal right dominant coronary arteries with false positive myovue EF 65% Only 30% proximal/mid and PLA lesions seen  Assessment and Plan: 1.  Paroxymal atrial fibrillation s/p afib ablation 10/10 In SR, no apparent complications s/p procedure Continue flecainide 100 mg bid Continue metoprolol 50 mg bid Continue apixaban 5 mg bid for chadsvasc of 2 (htn, insignificant cad), no interuption x 3 months  F/u with Patrick Gray in 3 months 03/18/15  F/u in afib clinic  as needed  Archer. Carroll, [facility] [address] [place], [place] [address] [phone]

## 2016-01-02 ENCOUNTER — Other Ambulatory Visit (HOSPITAL_COMMUNITY): Payer: Self-pay | Admitting: Nurse Practitioner

## 2016-01-10 ENCOUNTER — Ambulatory Visit: Payer: BC Managed Care – PPO | Admitting: Internal Medicine

## 2016-03-06 ENCOUNTER — Encounter: Payer: Self-pay | Admitting: *Deleted

## 2016-03-17 ENCOUNTER — Ambulatory Visit (INDEPENDENT_AMBULATORY_CARE_PROVIDER_SITE_OTHER): Payer: BC Managed Care – PPO | Admitting: Internal Medicine

## 2016-03-17 VITALS — BP 138/90 | HR 54 | Ht 71.0 in | Wt 218.0 lb

## 2016-03-17 DIAGNOSIS — I480 Paroxysmal atrial fibrillation: Secondary | ICD-10-CM

## 2016-03-17 NOTE — Progress Notes (Signed)
PCP: Gara Kroner, MD Primary Cardiologist:  Dr Kathrin Ruddy is a 61 y.o. male who presents today for routine electrophysiology followup.  Since his recent ablation, the patient reports doing very well.  He denies procedure related complications.  He is pleased that he is maintaining sinus rhythm. His primary concern is with L shoulder pain.  He says that he needs surgery.  He attributes this to injury of his should by cutting wood and overuse. Today, he denies symptoms of palpitations, chest pain, shortness of breath,  lower extremity edema, dizziness, presyncope, or syncope.  The patient is otherwise without complaint today.   Past Medical History:  Diagnosis Date  . Deafness in left ear   . HTN (hypertension)   . Hypercholesterolemia   . Hypopotassemia   . Paroxysmal atrial fibrillation [facility])    Past Surgical History:  Procedure Laterality Date  . ACOUSTIC NEUROMA RESECTION Left 19990  . APPENDECTOMY  1976  . CARDIAC CATHETERIZATION N/A 10/07/2015   Procedure: Left Heart Cath and Coronary Angiography;  Surgeon: Josue Hector, MD;  Location: [place] [facility];  Service: Cardiovascular;  Laterality: N/A;  . CARDIOVERSION N/A 06/17/2013   Procedure: CARDIOVERSION;  Surgeon: Josue Hector, MD;  Location: [place];  Service: Cardiovascular;  Laterality: N/A;  . CHOLECYSTECTOMY  1976  . CLUB FOOT RELEASE  1968  . ELECTROPHYSIOLOGIC STUDY N/A 11/30/2015   Procedure: Atrial Fibrillation Ablation;  Surgeon: Thompson Grayer, MD;  Location: [place] [facility];  Service: Cardiovascular;  Laterality: N/A;  . TONSILLECTOMY  1967    ROS- all systems are reviewed and negatives except as per HPI above  Current Outpatient Prescriptions  Medication Sig Dispense Refill  . amLODipine (NORVASC) 5 MG tablet Take 5 mg by mouth daily.     Marland Kitchen apixaban (ELIQUIS) 5 MG TABS tablet Take 1 tablet (5 mg total) by mouth 2 (two) times daily. 60 tablet 11  . atorvastatin (LIPITOR) 20 MG tablet Take 20  mg by mouth daily.    . Cholecalciferol (VITAMIN D3 PO) Take 1 capsule by mouth daily.    Marland Kitchen co-enzyme Q-10 30 MG capsule Take 30 mg by mouth daily.    . flecainide (TAMBOCOR) 100 MG tablet TAKE ONE TABLET BY MOUTH TWICE DAILY 60 tablet 3  . KLOR-CON M20 20 MEQ tablet Take 40 mEq by mouth daily.     Marland Kitchen lisinopril-hydrochlorothiazide (PRINZIDE,ZESTORETIC) 20-25 MG per tablet Take 1 tablet by mouth daily.     . magnesium 30 MG tablet Take 30 mg by mouth daily.    . metoprolol (LOPRESSOR) 50 MG tablet Take 1 tablet by mouth 2 (two) times daily.    Marland Kitchen VITAMIN K PO Take 1 tablet by mouth daily.     No current facility-administered medications for this visit.     Physical Exam: Vitals:   03/17/16 1502  BP: 138/90  Pulse: (!) 54  Weight: 218 lb (98.9 kg)  Height: 5\' 11"  (1.803 m)    GEN- The patient is well appearing, alert and oriented x 3 today.   Head- normocephalic, atraumatic Eyes-  Sclera clear, conjunctiva pink Ears- hearing intact Oropharynx- clear Lungs- Clear to ausculation bilaterally, normal work of breathing Heart- Regular rate and rhythm, no murmurs, rubs or gallops, PMI not laterally displaced GI- soft, NT, ND, + BS Extremities- no clubbing, cyanosis, or edema, L arm tremor noted  ekg today reveals sinus rhythm  Assessment and Plan:  1. Paroxysmal atrial fibrillation and atrial flutter  Doing  very well post ablation chads2vasc score is 1.  Will continue anticoagulation for now I have advised that he stop flecainide but he is reluctant to do so He may try to reduce to 50mg  BID.  2. HTN Stable No change required today  3. Nonischemic CM Repeat echo upon return, hopefully will improve with sinus  Return to see me in 3 months  Thompson Grayer MD, [facility] 03/17/2016 3:45 PM

## 2016-03-17 NOTE — Patient Instructions (Signed)
Medication Instructions:  Your physician has recommended you make the following change in your medication:  1) Decrease Flecainide to 50 mg twice daily--will leave prescription as is for now in case need to stay on current dose   Labwork: None ordered   Testing/Procedures: None ordered   Follow-Up: Your physician recommends that you schedule a follow-up appointment in: 3 months with Dr Rayann Heman   Any Other Special Instructions Will Be Listed Below (If Applicable).     If you need a refill on your cardiac medications before your next appointment, please call your pharmacy.

## 2016-04-05 ENCOUNTER — Telehealth (HOSPITAL_COMMUNITY): Payer: Self-pay | Admitting: *Deleted

## 2016-04-05 NOTE — Telephone Encounter (Signed)
Pt with hx of Afib with CHADS2<4 and no history of stroke. Per protocol OK to hold Eliquis 48 hours prior to procedure

## 2016-04-05 NOTE — Telephone Encounter (Addendum)
Patient is scheduled to have shoulder replacement surgery with Dr. Tamera Punt and needs to know how far in advance he can hold Eliquis.

## 2016-04-05 NOTE — Telephone Encounter (Signed)
Informed pt and copy of note faxed to Dr. Bettina Gavia office per patient request.

## 2016-04-17 ENCOUNTER — Other Ambulatory Visit: Payer: Self-pay | Admitting: Orthopedic Surgery

## 2016-04-19 ENCOUNTER — Other Ambulatory Visit: Payer: Self-pay | Admitting: Orthopedic Surgery

## 2016-04-19 DIAGNOSIS — M19012 Primary osteoarthritis, left shoulder: Secondary | ICD-10-CM

## 2016-04-20 ENCOUNTER — Telehealth (HOSPITAL_COMMUNITY): Payer: Self-pay | Admitting: *Deleted

## 2016-04-20 NOTE — Telephone Encounter (Signed)
Pt called in stating he went into afib earlier this morning his heart rate is controlled but he knows hes in afib. He had recently reduced his flecainide per Dr. Rayann Heman instructions post 3 month ablation reck. Discussed with Roderic Palau NP will increase flecainide to 100mg  BID and call with report on Monday. If still in afib will bring in for appointment. Pt would like to avoid DCCV due to upcoming shoulder surgery on 3/22. Pt will call with report on Monday.

## 2016-04-24 ENCOUNTER — Ambulatory Visit
Admission: RE | Admit: 2016-04-24 | Discharge: 2016-04-24 | Disposition: A | Discharge status: Home / Self Care | Payer: BC Managed Care – PPO | Source: Ambulatory Visit | Attending: Orthopedic Surgery | Admitting: Orthopedic Surgery

## 2016-04-24 DIAGNOSIS — M19012 Primary osteoarthritis, left shoulder: Secondary | ICD-10-CM

## 2016-04-29 ENCOUNTER — Encounter: Payer: Self-pay | Admitting: Internal Medicine

## 2016-05-02 ENCOUNTER — Encounter (HOSPITAL_COMMUNITY): Payer: Self-pay | Admitting: Nurse Practitioner

## 2016-05-02 ENCOUNTER — Ambulatory Visit (HOSPITAL_COMMUNITY)
Admission: RE | Admit: 2016-05-02 | Discharge: 2016-05-02 | Disposition: A | Discharge status: Home / Self Care | Payer: BC Managed Care – PPO | Source: Ambulatory Visit | Attending: Nurse Practitioner | Admitting: Nurse Practitioner

## 2016-05-02 VITALS — BP 136/86 | HR 84 | Ht 71.0 in | Wt 217.0 lb

## 2016-05-02 DIAGNOSIS — I4892 Unspecified atrial flutter: Secondary | ICD-10-CM

## 2016-05-02 DIAGNOSIS — I10 Essential (primary) hypertension: Secondary | ICD-10-CM | POA: Insufficient documentation

## 2016-05-02 DIAGNOSIS — E7800 Pure hypercholesterolemia, unspecified: Secondary | ICD-10-CM | POA: Diagnosis not present

## 2016-05-02 DIAGNOSIS — I480 Paroxysmal atrial fibrillation: Secondary | ICD-10-CM | POA: Diagnosis present

## 2016-05-02 DIAGNOSIS — Z823 Family history of stroke: Secondary | ICD-10-CM | POA: Diagnosis not present

## 2016-05-02 DIAGNOSIS — Z880 Allergy status to penicillin: Secondary | ICD-10-CM | POA: Diagnosis not present

## 2016-05-02 DIAGNOSIS — Z79899 Other long term (current) drug therapy: Secondary | ICD-10-CM | POA: Diagnosis not present

## 2016-05-02 DIAGNOSIS — Z7901 Long term (current) use of anticoagulants: Secondary | ICD-10-CM | POA: Insufficient documentation

## 2016-05-02 DIAGNOSIS — Z87891 Personal history of nicotine dependence: Secondary | ICD-10-CM | POA: Insufficient documentation

## 2016-05-02 DIAGNOSIS — Z8249 Family history of ischemic heart disease and other diseases of the circulatory system: Secondary | ICD-10-CM | POA: Insufficient documentation

## 2016-05-02 LAB — CBC
HCT: 45.1 % (ref 39.0–52.0)
Hemoglobin: 16.6 g/dL (ref 13.0–17.0)
MCH: 31.6 pg (ref 26.0–34.0)
MCHC: 36.8 g/dL — ABNORMAL HIGH (ref 30.0–36.0)
MCV: 85.7 fL (ref 78.0–100.0)
Platelets: 139 10*3/uL — ABNORMAL LOW (ref 150–400)
RBC: 5.26 MIL/uL (ref 4.22–5.81)
RDW: 13.4 % (ref 11.5–15.5)
WBC: 5.6 10*3/uL (ref 4.0–10.5)

## 2016-05-02 LAB — BASIC METABOLIC PANEL
Anion gap: 9 (ref 5–15)
BUN: 15 mg/dL (ref 6–20)
CO2: 29 mmol/L (ref 22–32)
Calcium: 9.5 mg/dL (ref 8.9–10.3)
Chloride: 101 mmol/L (ref 101–111)
Creatinine, Ser: 0.8 mg/dL (ref 0.61–1.24)
GFR calc Af Amer: >60 mL/min (ref >60)
GFR calc non Af Amer: >60 mL/min (ref >60)
Glucose, Bld: 108 mg/dL — ABNORMAL HIGH (ref 65–99)
Potassium: 3.7 mmol/L (ref 3.5–5.1)
Sodium: 139 mmol/L (ref 135–145)

## 2016-05-02 NOTE — Patient Instructions (Signed)
Cardioversion scheduled for Thursday, March 22nd  - Arrive at the [facility] and go to admitting at 12:30PM  -Do not eat or drink anything after midnight the night prior to your procedure.  - Take all your medication with a sip of water prior to arrival.  - You will not be able to drive home after your procedure.

## 2016-05-02 NOTE — Progress Notes (Signed)
Patient ID: EMMITTE SURGEON, male   DOB: [birth year], 61 y.o.   MRN: [id number]     Primary Care Physician: Gara Kroner, MD Referring Physician: Messiah Ahr is a 61 y.o. male with a h/o afib 2015 found by primary. Was placed on flecainide 100 mg bid and has done well until seen 6/20 and was noted to be in aflutter with RVR. Has noted some increase in irregular heart beat over the last few weeks. Flecainide was increased to 150 mg bid by Dr. Johnsie Cancel, 6/20, and asked to f/u here. EKG now shows SR, but he feels  fatigue. Wife states he does snore, has daytime somnolence and he drinks at least two beers a night. Minimal caffeine, no smoking. Exercises at the gym and has lost some weight but would like to lose 20 lbs more. Chadsvasc score is 1 for HTN, has been on blood thinner for 2 years, compliant without bleeding issues.Has an older brother and mother with afib, brother has had ablation. Flecainide level was checked on 150 mg bid and was in range. Echo ordered.  Follows up 7/13 and states has really been working on his lifestyle. Admits was drinking a case of beer a week and has only had two beers in two weeks. Since he has been drinking less, he is sleeping better and wife reports less snoring. He feels so much better and has great energy now and is going to there gym more. Echo did return with EF of 45-50%,  EF was 55-60% in 2015. Continues in SR, no afib episodes.  Being seeing in afib clinic due to pt c/o afib for last 10 days. Since I saw pt last, he had abnormal tress test and subsequent catherization and had insignificant 30% disease of RCA, EF was normal. He was seen in f/u by Dr. Rayann Heman and was having very little afib burden, with lifestyle changes, and it was decided that he would stay on flecainide.But Dr. Rayann Heman did mention that if afib returned he would consider ablation. Pt given option of having cardioversion and /or seeing Dr. Rayann Heman back for possible ablation. He tells me that he  has returned to SR after 2-3 weeks of being in afib and he would like to avoid DCCV and get appointment with Dr. Rayann Heman. He does have more fatigue with afib but still can continue his usual activities.  Returns to afib clinic 10/17 after having ablation 10/10. He was d/c in afib and he did go back to rhythm 10/13. Noticed another small episode a few days later but since  then has been in SR. No groin pain, no swallowing activities. He is looking forward to increasing his activities.   F/u one month, afib ablation. He is in SR. He is doing well, has not noticed any afib. No dysphagia or groin issues. He is needing surgery on his left rotator cuff and it was explained that this would have to be on hold until 3 months after the procedure, so as not to interrupt  Blood thinner following ablation.  F/u in afib clinic 3 13. He is pending shoulder surgery next week and has been in afib x 2 weeks. Flecainide was decreased to 50 mg bid and within several days he returned to afib. He is concerned as he will have to be be off blood thinner x 5 days prior to surgery. Chadsvasc score is 1- 2 (htn and non obstructive cad)   Today, he denies symptoms of palpitations, chest pain, shortness  of breath, orthopnea, PND, lower extremity edema, dizziness, presyncope, syncope, or neurologic sequela. Positive for fatigue in afib , hand tremor. The patient is tolerating medications without difficulties and is otherwise without complaint today.   Past Medical History:  Diagnosis Date  . Deafness in left ear   . HTN (hypertension)   . Hypercholesterolemia   . Hypopotassemia   . Paroxysmal atrial fibrillation [facility])    Past Surgical History:  Procedure Laterality Date  . ACOUSTIC NEUROMA RESECTION Left 19990  . APPENDECTOMY  1976  . CARDIAC CATHETERIZATION N/A 10/07/2015   Procedure: Left Heart Cath and Coronary Angiography;  Surgeon: Josue Hector, MD;  Location: [place] [facility];  Service: Cardiovascular;  Laterality:  N/A;  . CARDIOVERSION N/A 06/17/2013   Procedure: CARDIOVERSION;  Surgeon: Josue Hector, MD;  Location: [place];  Service: Cardiovascular;  Laterality: N/A;  . CHOLECYSTECTOMY  1976  . CLUB FOOT RELEASE  1968  . ELECTROPHYSIOLOGIC STUDY N/A 11/30/2015   Procedure: Atrial Fibrillation Ablation;  Surgeon: Thompson Grayer, MD;  Location: [place] [facility];  Service: Cardiovascular;  Laterality: N/A;  . TONSILLECTOMY  1967    Current Outpatient Prescriptions  Medication Sig Dispense Refill  . amLODipine (NORVASC) 5 MG tablet Take 5 mg by mouth daily.     Marland Kitchen apixaban (ELIQUIS) 5 MG TABS tablet Take 1 tablet (5 mg total) by mouth 2 (two) times daily. 60 tablet 11  . atorvastatin (LIPITOR) 20 MG tablet Take 20 mg by mouth daily.    . Cholecalciferol (VITAMIN D3 PO) Take 1 capsule by mouth daily.    Marland Kitchen co-enzyme Q-10 30 MG capsule Take 30 mg by mouth daily.    . flecainide (TAMBOCOR) 100 MG tablet TAKE ONE TABLET BY MOUTH TWICE DAILY 60 tablet 3  . KLOR-CON M20 20 MEQ tablet Take 40 mEq by mouth daily.     Marland Kitchen lisinopril-hydrochlorothiazide (PRINZIDE,ZESTORETIC) 20-25 MG per tablet Take 1 tablet by mouth daily.     . Magnesium Oxide (MAG-OX PO) Take 625 mg elemental calcium/kg/hr by mouth every morning.    . metoprolol (LOPRESSOR) 50 MG tablet Take 1 tablet by mouth 2 (two) times daily.    Marland Kitchen VITAMIN K PO Take 1 tablet by mouth daily.     No current facility-administered medications for this encounter.     Allergies  Allergen Reactions  . Penicillins     Rash as a Child Has patient had a PCN reaction causing immediate rash, facial/tongue/throat swelling, SOB or lightheadedness with hypotension: No Has patient had a PCN reaction causing severe rash involving mucus membranes or skin necrosis: No Has patient had a PCN reaction that required hospitalization No Has patient had a PCN reaction occurring within the last 10 years: No If all of the above answers are "NO", then may proceed with  Cephalosporin use.     Social History   Social History  . Marital status: Married    Spouse name: N/A  . Number of children: N/A  . Years of education: N/A   Occupational History  . Not on file.   Social History Main Topics  . Smoking status: Former Research scientist (life sciences)  . Smokeless tobacco: Never Used  . Alcohol use Yes     Comment: previously 20 beers per week, now 2 beers per week  . Drug use: No  . Sexual activity: Not on file   Other Topics Concern  . Not on file   Social History Narrative   Lives in [place] wife.  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    Family History  Problem Relation Age of Onset  . Hypertension Father   . Hypertension Mother   . Stroke Mother   . CVA Mother   . Hypertension Brother   . Hypercholesterolemia Brother   . Atrial fibrillation Brother   . Congestive Heart Failure Brother     ROS- All systems are reviewed and negative except as per the HPI above  Physical Exam: Vitals:   05/02/16 1341  BP: 136/86  Pulse: 84  Weight: 217 lb (98.4 kg)  Height: 5\' 11"  (1.803 m)    GEN- The patient is well appearing, alert and oriented x 3 today.   Head- normocephalic, atraumatic Eyes-  Sclera clear, conjunctiva pink Ears- hearing intact Oropharynx- clear Neck- supple, no JVP Lymph- no cervical lymphadenopathy Lungs- Clear to ausculation bilaterally, normal work of breathing Heart -irregular rate and rhythm, no murmurs, rubs or gallops, PMI not laterally displaced GI- soft, NT, ND, + BS Extremities- no clubbing, cyanosis, or edema MS- no significant deformity or atrophy Skin- no rash or lesion Psych- euthymic mood, full affect Neuro- strength and sensation are intact  EKG- atrial flutter at 84 bpm, qrs int 116 ms, qrs int 116 ms, qtc 446 ms Epic records reviewed Echo 6-17-.- Left ventricle: The cavity size was moderately dilated. Wall  thickness was increased in a pattern of mild LVH. Systolic  function was mildly reduced. The  estimated ejection fraction was  in the range of 45% to 50%. Diffuse hypokinesis. - Aortic valve: There was trivial regurgitation. - Left atrium: The atrium was moderately dilated. 47 mm  Impressions:  - Mild global reduction in LV function; moderate LVE; mild LVH;   trace AI; moderate LAE.  10/07/15-Cardiac catherization-Prox RCA lesion, 30 %stenosed.  RPDA lesion, 30 %stenosed.   Normal right dominant coronary arteries with false positive myovue EF 65% Only 30% proximal/mid and PLA lesions seen  Assessment and Plan: 1.  Paroxymal atrial fibrillation s/p afib ablation 10/10 Return to aflutter last two weeks with pending shoulder surgery In to discuss options going into surgery Surgeon wants off anticoagulant 5 days prior to surgery He is rate controlled and could proceed to surgery knowing that he is at small risk of stroke  off anticoagulant  Or have a cardioversion to restore SR and postpone surgery for 30 days after cardioversion He would like to pursue cardioversion Continue flecainide 100 mg bid Continue metoprolol 50 mg bid Continue apixaban 5 mg bid for chadsvasc of 1- 2 (htn, insignificant cad), no interuption x 3 weeks  Scheduled for cardioversion 3/22 Pt will not be able to  reschedule his surgery for at least 30 days post cardioversion If this cardioversion does not hold, he is interested in pursing repeat ablation at a later date. F/u in afib clinic  1 week   Geroge Baseman. [place], [facility] [address] [place], [place] [address] [phone]

## 2016-05-03 ENCOUNTER — Inpatient Hospital Stay (HOSPITAL_COMMUNITY): Admission: RE | Admit: 2016-05-03 | Payer: BC Managed Care – PPO | Source: Ambulatory Visit

## 2016-05-09 ENCOUNTER — Telehealth (HOSPITAL_COMMUNITY): Payer: Self-pay | Admitting: *Deleted

## 2016-05-09 ENCOUNTER — Other Ambulatory Visit (HOSPITAL_COMMUNITY): Payer: Self-pay | Admitting: *Deleted

## 2016-05-09 DIAGNOSIS — I4819 Other persistent atrial fibrillation: Secondary | ICD-10-CM

## 2016-05-09 NOTE — Telephone Encounter (Signed)
Patient called in stating he missed a dose of Eliquis on Monday, 3/19. Will have TEE added to DCCV for Thursday. Pt instructed to not miss any more doses of Eliquis.

## 2016-05-11 ENCOUNTER — Other Ambulatory Visit: Payer: Self-pay

## 2016-05-11 ENCOUNTER — Ambulatory Visit (HOSPITAL_COMMUNITY)
Admission: RE | Admit: 2016-05-11 | Discharge: 2016-05-11 | Disposition: A | Discharge status: Home / Self Care | Payer: BC Managed Care – PPO | Source: Ambulatory Visit | Attending: Cardiovascular Disease | Admitting: Cardiovascular Disease

## 2016-05-11 ENCOUNTER — Ambulatory Visit (HOSPITAL_COMMUNITY): Payer: BC Managed Care – PPO | Admitting: Certified Registered Nurse Anesthetist

## 2016-05-11 ENCOUNTER — Ambulatory Visit (HOSPITAL_BASED_OUTPATIENT_CLINIC_OR_DEPARTMENT_OTHER)
Admission: RE | Admit: 2016-05-11 | Discharge: 2016-05-11 | Disposition: A | Discharge status: Home / Self Care | Payer: BC Managed Care – PPO | Source: Ambulatory Visit | Attending: Nurse Practitioner | Admitting: Nurse Practitioner

## 2016-05-11 ENCOUNTER — Encounter (HOSPITAL_COMMUNITY)
Admission: RE | Disposition: A | Discharge status: Home / Self Care | Payer: Self-pay | Source: Ambulatory Visit | Attending: Cardiovascular Disease

## 2016-05-11 ENCOUNTER — Encounter (HOSPITAL_COMMUNITY): Payer: Self-pay | Admitting: Certified Registered Nurse Anesthetist

## 2016-05-11 DIAGNOSIS — I481 Persistent atrial fibrillation: Secondary | ICD-10-CM | POA: Diagnosis not present

## 2016-05-11 DIAGNOSIS — Z7901 Long term (current) use of anticoagulants: Secondary | ICD-10-CM | POA: Insufficient documentation

## 2016-05-11 DIAGNOSIS — Z880 Allergy status to penicillin: Secondary | ICD-10-CM | POA: Diagnosis not present

## 2016-05-11 DIAGNOSIS — I4892 Unspecified atrial flutter: Secondary | ICD-10-CM | POA: Diagnosis not present

## 2016-05-11 DIAGNOSIS — Z8349 Family history of other endocrine, nutritional and metabolic diseases: Secondary | ICD-10-CM | POA: Diagnosis not present

## 2016-05-11 DIAGNOSIS — Z9049 Acquired absence of other specified parts of digestive tract: Secondary | ICD-10-CM | POA: Diagnosis not present

## 2016-05-11 DIAGNOSIS — I10 Essential (primary) hypertension: Secondary | ICD-10-CM | POA: Insufficient documentation

## 2016-05-11 DIAGNOSIS — Z823 Family history of stroke: Secondary | ICD-10-CM | POA: Diagnosis not present

## 2016-05-11 DIAGNOSIS — Z87891 Personal history of nicotine dependence: Secondary | ICD-10-CM | POA: Diagnosis not present

## 2016-05-11 DIAGNOSIS — I480 Paroxysmal atrial fibrillation: Secondary | ICD-10-CM | POA: Diagnosis present

## 2016-05-11 DIAGNOSIS — Z9889 Other specified postprocedural states: Secondary | ICD-10-CM | POA: Insufficient documentation

## 2016-05-11 DIAGNOSIS — I340 Nonrheumatic mitral (valve) insufficiency: Secondary | ICD-10-CM

## 2016-05-11 DIAGNOSIS — Z79899 Other long term (current) drug therapy: Secondary | ICD-10-CM | POA: Insufficient documentation

## 2016-05-11 DIAGNOSIS — Z8249 Family history of ischemic heart disease and other diseases of the circulatory system: Secondary | ICD-10-CM | POA: Diagnosis not present

## 2016-05-11 DIAGNOSIS — E7800 Pure hypercholesterolemia, unspecified: Secondary | ICD-10-CM | POA: Diagnosis not present

## 2016-05-11 DIAGNOSIS — I4819 Other persistent atrial fibrillation: Secondary | ICD-10-CM

## 2016-05-11 HISTORY — PX: CARDIOVERSION: SHX1299

## 2016-05-11 HISTORY — PX: TEE WITHOUT CARDIOVERSION: SHX5443

## 2016-05-11 SURGERY — CARDIOVERSION
Anesthesia: General

## 2016-05-11 SURGERY — ARTHROPLASTY, SHOULDER, TOTAL
Anesthesia: Choice | Laterality: Left

## 2016-05-11 MED ORDER — SODIUM CHLORIDE 0.9 % IV SOLN
INTRAVENOUS | Status: DC
  Administered 2016-05-11 (×2): via INTRAVENOUS

## 2016-05-11 MED ORDER — PROPOFOL 10 MG/ML IV BOLUS
INTRAVENOUS | Status: DC | PRN
  Administered 2016-05-11: 60 mg via INTRAVENOUS
  Administered 2016-05-11: 30 mg via INTRAVENOUS

## 2016-05-11 MED ORDER — PROPOFOL 500 MG/50ML IV EMUL
INTRAVENOUS | Status: DC | PRN
  Administered 2016-05-11: 50 ug/kg/min via INTRAVENOUS

## 2016-05-11 MED ORDER — BUTAMBEN-TETRACAINE-BENZOCAINE 2-2-14 % EX AERO
INHALATION_SPRAY | CUTANEOUS | Status: DC | PRN
  Administered 2016-05-11: 2 via TOPICAL

## 2016-05-11 NOTE — Anesthesia Preprocedure Evaluation (Addendum)
Anesthesia Evaluation  Patient identified by MRN, date of birth, ID band Patient awake    Reviewed: Allergy & Precautions, NPO status , Patient's Chart, lab work & pertinent test results  Airway Mallampati: II  TM Distance: >3 FB     Dental   Pulmonary former smoker,    breath sounds clear to auscultation       Cardiovascular hypertension,  Rhythm:Irregular Rate:Normal     Neuro/Psych    GI/Hepatic negative GI ROS, Neg liver ROS,   Endo/Other  negative endocrine ROS  Renal/GU negative Renal ROS     Musculoskeletal   Abdominal   Peds  Hematology   Anesthesia Other Findings   Reproductive/Obstetrics                             Anesthesia Physical Anesthesia Plan  ASA: III  Anesthesia Plan: General   Post-op Pain Management:    Induction: Intravenous  Airway Management Planned: Mask and Simple Face Mask  Additional Equipment:   Intra-op Plan:   Post-operative Plan:   Informed Consent: I have reviewed the patients History and Physical, chart, labs and discussed the procedure including the risks, benefits and alternatives for the proposed anesthesia with the patient or authorized representative who has indicated his/her understanding and acceptance.   Dental advisory given  Plan Discussed with: CRNA, Anesthesiologist and Surgeon  Anesthesia Plan Comments:         Anesthesia Quick Evaluation

## 2016-05-11 NOTE — Anesthesia Postprocedure Evaluation (Signed)
Anesthesia Post Note  Patient: Patrick Gray  Procedure(s) Performed: Procedure(s) (LRB): CARDIOVERSION (N/A) TRANSESOPHAGEAL ECHOCARDIOGRAM (TEE) (N/A)  Patient location during evaluation: PACU Anesthesia Type: General Level of consciousness: awake Pain management: pain level controlled Vital Signs Assessment: post-procedure vital signs reviewed and stable Cardiovascular status: stable Anesthetic complications: no       Last Vitals:  Vitals:   05/11/16 1352 05/11/16 1400  BP: 118/89 122/88  Pulse: 66 69  Resp: 16 12  Temp:      Last Pain:  Vitals:   05/11/16 1342  TempSrc: Oral                 Zuleyka Kloc

## 2016-05-11 NOTE — Discharge Instructions (Signed)
Electrical Cardioversion, Care After °This sheet gives you information about how to care for yourself after your procedure. Your health care provider may also give you more specific instructions. If you have problems or questions, contact your health care provider. °What can I expect after the procedure? °After the procedure, it is common to have: °· Some redness on the skin where the shocks were given. °Follow these instructions at home: °· Do not drive for 24 hours if you were given a medicine to help you relax (sedative). °· Take over-the-counter and prescription medicines only as told by your health care provider. °· Ask your health care provider how to check your pulse. Check it often. °· Rest for 48 hours after the procedure or as told by your health care provider. °· Avoid or limit your caffeine use as told by your health care provider. °Contact a health care provider if: °· You feel like your heart is beating too quickly or your pulse is not regular. °· You have a serious muscle cramp that does not go away. °Get help right away if: °· You have discomfort in your chest. °· You are dizzy or you feel faint. °· You have trouble breathing or you are short of breath. °· Your speech is slurred. °· You have trouble moving an arm or leg on one side of your body. °· Your fingers or toes turn cold or blue. °This information is not intended to replace advice given to you by your health care provider. Make sure you discuss any questions you have with your health care provider. °Document Released: 11/27/2012 Document Revised: 09/10/2015 Document Reviewed: 08/13/2015 °Elsevier Interactive Patient Education © 2017 [employer] ° °

## 2016-05-11 NOTE — Interval H&P Note (Signed)
History and Physical Interval Note:  05/11/2016 1:14 PM  Patrick Gray  has presented today for surgery, with the diagnosis of AFLUTTER  The various methods of treatment have been discussed with the patient and family. After consideration of risks, benefits and other options for treatment, the patient has consented to  Procedure(s): CARDIOVERSION (N/A) TRANSESOPHAGEAL ECHOCARDIOGRAM (TEE) (N/A) as a surgical intervention .  The patient's history has been reviewed, patient examined, no change in status, stable for surgery.  I have reviewed the patient's chart and labs.  Questions were answered to the patient's satisfaction.     [name]

## 2016-05-11 NOTE — Anesthesia Procedure Notes (Signed)
Date/Time: 05/11/2016 1:25 PM Performed by: Willeen Cass P Pre-anesthesia Checklist: Patient identified, Emergency Drugs available, Suction available, Patient being monitored and Timeout performed Patient Re-evaluated:Patient Re-evaluated prior to inductionOxygen Delivery Method: Nasal cannula Intubation Type: IV induction Placement Confirmation: positive ETCO2 Dental Injury: Teeth and Oropharynx as per pre-operative assessment

## 2016-05-11 NOTE — Op Note (Signed)
INDICATIONS: atrial fibrillation  PROCEDURE:   Informed consent was obtained prior to the procedure. The risks, benefits and alternatives for the procedure were discussed and the patient comprehended these risks.  Risks include, but are not limited to, cough, sore throat, vomiting, nausea, somnolence, esophageal and stomach trauma or perforation, bleeding, low blood pressure, aspiration, pneumonia, infection, trauma to the teeth and death.    After a procedural time-out, the oropharynx was anesthetized with 20% benzocaine spray.   During this procedure the patient was administered IV propofol, Dr. Nyoka Cowden Anesthesiology.  The transesophageal probe was inserted in the esophagus and stomach without difficulty and multiple views were obtained.  The patient was kept under observation until the patient left the procedure room.  The patient left the procedure room in stable condition.   Agitated microbubble saline contrast was not administered.  COMPLICATIONS:   There were no immediate complications.  FINDINGS:  No LA thrombus.  RECOMMENDATIONS:   Proceed with cardioversion.  Time Spent Directly with the Patient:  30 minutes   Patrick Gray 05/11/2016, 1:35 PM

## 2016-05-11 NOTE — Transfer of Care (Signed)
Immediate Anesthesia Transfer of Care Note  Patient: Patrick Gray  Procedure(s) Performed: Procedure(s): CARDIOVERSION (N/A) TRANSESOPHAGEAL ECHOCARDIOGRAM (TEE) (N/A)  Patient Location: PACU and Endoscopy Unit  Anesthesia Type:MAC  Level of Consciousness: awake, alert , oriented and patient cooperative  Airway & Oxygen Therapy: Patient Spontanous Breathing and Patient connected to nasal cannula oxygen  Post-op Assessment: Report given to RN, Post -op Vital signs reviewed and stable and Patient moving all extremities  Post vital signs: Reviewed and stable  Last Vitals:  Vitals:   05/11/16 1241  BP: (!) 151/85  Pulse: 67  Resp: 13  Temp: 36.8 C    Last Pain:  Vitals:   05/11/16 1241  TempSrc: Oral         Complications: No apparent anesthesia complications

## 2016-05-11 NOTE — Op Note (Signed)
Procedure: Electrical Cardioversion Indications:  Atrial Fibrillation  Procedure Details:  Consent: Risks of procedure as well as the alternatives and risks of each were explained to the (patient/caregiver).  Consent for procedure obtained.  Time Out: Verified patient identification, verified procedure, site/side was marked, verified correct patient position, special equipment/implants available, medications/allergies/relevent history reviewed, required imaging and test results available.  Performed  Patient placed on cardiac monitor, pulse oximetry, supplemental oxygen as necessary.  Sedation given: IV propofol, Anesthesiology, Dr. Nyoka Cowden Pacer pads placed anterior and posterior chest.  Cardioverted 1 time(s).  Cardioversion with synchronized biphasic 120J shock.  Evaluation: Findings: Post procedure EKG shows: NSR Complications: None Patient did tolerate procedure well.  Time Spent Directly with the Patient:  30 minutes   Patrick Gray 05/11/2016, 1:36 PM

## 2016-05-11 NOTE — H&P (View-Only) (Signed)
Patient ID: Patrick Gray, male   DOB: [birth year], 61 y.o.   MRN: [id number]     Primary Care Physician: Gara Kroner, MD Referring Physician: Santana Edell is a 61 y.o. male with a h/o afib 2015 found by primary. Was placed on flecainide 100 mg bid and has done well until seen 6/20 and was noted to be in aflutter with RVR. Has noted some increase in irregular heart beat over the last few weeks. Flecainide was increased to 150 mg bid by Dr. Johnsie Cancel, 6/20, and asked to f/u here. EKG now shows SR, but he feels  fatigue. Wife states he does snore, has daytime somnolence and he drinks at least two beers a night. Minimal caffeine, no smoking. Exercises at the gym and has lost some weight but would like to lose 20 lbs more. Chadsvasc score is 1 for HTN, has been on blood thinner for 2 years, compliant without bleeding issues.Has an older brother and mother with afib, brother has had ablation. Flecainide level was checked on 150 mg bid and was in range. Echo ordered.  Follows up 7/13 and states has really been working on his lifestyle. Admits was drinking a case of beer a week and has only had two beers in two weeks. Since he has been drinking less, he is sleeping better and wife reports less snoring. He feels so much better and has great energy now and is going to there gym more. Echo did return with EF of 45-50%,  EF was 55-60% in 2015. Continues in SR, no afib episodes.  Being seeing in afib clinic due to pt c/o afib for last 10 days. Since I saw pt last, he had abnormal tress test and subsequent catherization and had insignificant 30% disease of RCA, EF was normal. He was seen in f/u by Dr. Rayann Heman and was having very little afib burden, with lifestyle changes, and it was decided that he would stay on flecainide.But Dr. Rayann Heman did mention that if afib returned he would consider ablation. Pt given option of having cardioversion and /or seeing Dr. Rayann Heman back for possible ablation. He tells me that he  has returned to SR after 2-3 weeks of being in afib and he would like to avoid DCCV and get appointment with Dr. Rayann Heman. He does have more fatigue with afib but still can continue his usual activities.  Returns to afib clinic 10/17 after having ablation 10/10. He was d/c in afib and he did go back to rhythm 10/13. Noticed another small episode a few days later but since  then has been in SR. No groin pain, no swallowing activities. He is looking forward to increasing his activities.   F/u one month, afib ablation. He is in SR. He is doing well, has not noticed any afib. No dysphagia or groin issues. He is needing surgery on his left rotator cuff and it was explained that this would have to be on hold until 3 months after the procedure, so as not to interrupt  Blood thinner following ablation.  F/u in afib clinic 3 13. He is pending shoulder surgery next week and has been in afib x 2 weeks. Flecainide was decreased to 50 mg bid and within several days he returned to afib. He is concerned as he will have to be be off blood thinner x 5 days prior to surgery. Chadsvasc score is 1- 2 (htn and non obstructive cad)   Today, he denies symptoms of palpitations, chest pain, shortness  of breath, orthopnea, PND, lower extremity edema, dizziness, presyncope, syncope, or neurologic sequela. Positive for fatigue in afib , hand tremor. The patient is tolerating medications without difficulties and is otherwise without complaint today.   Past Medical History:  Diagnosis Date  . Deafness in left ear   . HTN (hypertension)   . Hypercholesterolemia   . Hypopotassemia   . Paroxysmal atrial fibrillation [facility])    Past Surgical History:  Procedure Laterality Date  . ACOUSTIC NEUROMA RESECTION Left 19990  . APPENDECTOMY  1976  . CARDIAC CATHETERIZATION N/A 10/07/2015   Procedure: Left Heart Cath and Coronary Angiography;  Surgeon: Josue Hector, MD;  Location: [place] [facility];  Service: Cardiovascular;  Laterality:  N/A;  . CARDIOVERSION N/A 06/17/2013   Procedure: CARDIOVERSION;  Surgeon: Josue Hector, MD;  Location: [place];  Service: Cardiovascular;  Laterality: N/A;  . CHOLECYSTECTOMY  1976  . CLUB FOOT RELEASE  1968  . ELECTROPHYSIOLOGIC STUDY N/A 11/30/2015   Procedure: Atrial Fibrillation Ablation;  Surgeon: Thompson Grayer, MD;  Location: [place] [facility];  Service: Cardiovascular;  Laterality: N/A;  . TONSILLECTOMY  1967    Current Outpatient Prescriptions  Medication Sig Dispense Refill  . amLODipine (NORVASC) 5 MG tablet Take 5 mg by mouth daily.     Marland Kitchen apixaban (ELIQUIS) 5 MG TABS tablet Take 1 tablet (5 mg total) by mouth 2 (two) times daily. 60 tablet 11  . atorvastatin (LIPITOR) 20 MG tablet Take 20 mg by mouth daily.    . Cholecalciferol (VITAMIN D3 PO) Take 1 capsule by mouth daily.    Marland Kitchen co-enzyme Q-10 30 MG capsule Take 30 mg by mouth daily.    . flecainide (TAMBOCOR) 100 MG tablet TAKE ONE TABLET BY MOUTH TWICE DAILY 60 tablet 3  . KLOR-CON M20 20 MEQ tablet Take 40 mEq by mouth daily.     Marland Kitchen lisinopril-hydrochlorothiazide (PRINZIDE,ZESTORETIC) 20-25 MG per tablet Take 1 tablet by mouth daily.     . Magnesium Oxide (MAG-OX PO) Take 625 mg elemental calcium/kg/hr by mouth every morning.    . metoprolol (LOPRESSOR) 50 MG tablet Take 1 tablet by mouth 2 (two) times daily.    Marland Kitchen VITAMIN K PO Take 1 tablet by mouth daily.     No current facility-administered medications for this encounter.     Allergies  Allergen Reactions  . Penicillins     Rash as a Child Has patient had a PCN reaction causing immediate rash, facial/tongue/throat swelling, SOB or lightheadedness with hypotension: No Has patient had a PCN reaction causing severe rash involving mucus membranes or skin necrosis: No Has patient had a PCN reaction that required hospitalization No Has patient had a PCN reaction occurring within the last 10 years: No If all of the above answers are "NO", then may proceed with  Cephalosporin use.     Social History   Social History  . Marital status: Married    Spouse name: N/A  . Number of children: N/A  . Years of education: N/A   Occupational History  . Not on file.   Social History Main Topics  . Smoking status: Former Research scientist (life sciences)  . Smokeless tobacco: Never Used  . Alcohol use Yes     Comment: previously 20 beers per week, now 2 beers per week  . Drug use: No  . Sexual activity: Not on file   Other Topics Concern  . Not on file   Social History Narrative   Lives in [place] wife.  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    Family History  Problem Relation Age of Onset  . Hypertension Father   . Hypertension Mother   . Stroke Mother   . CVA Mother   . Hypertension Brother   . Hypercholesterolemia Brother   . Atrial fibrillation Brother   . Congestive Heart Failure Brother     ROS- All systems are reviewed and negative except as per the HPI above  Physical Exam: Vitals:   05/02/16 1341  BP: 136/86  Pulse: 84  Weight: 217 lb (98.4 kg)  Height: 5\' 11"  (1.803 m)    GEN- The patient is well appearing, alert and oriented x 3 today.   Head- normocephalic, atraumatic Eyes-  Sclera clear, conjunctiva pink Ears- hearing intact Oropharynx- clear Neck- supple, no JVP Lymph- no cervical lymphadenopathy Lungs- Clear to ausculation bilaterally, normal work of breathing Heart -irregular rate and rhythm, no murmurs, rubs or gallops, PMI not laterally displaced GI- soft, NT, ND, + BS Extremities- no clubbing, cyanosis, or edema MS- no significant deformity or atrophy Skin- no rash or lesion Psych- euthymic mood, full affect Neuro- strength and sensation are intact  EKG- atrial flutter at 84 bpm, qrs int 116 ms, qrs int 116 ms, qtc 446 ms Epic records reviewed Echo 6-17-.- Left ventricle: The cavity size was moderately dilated. Wall  thickness was increased in a pattern of mild LVH. Systolic  function was mildly reduced. The  estimated ejection fraction was  in the range of 45% to 50%. Diffuse hypokinesis. - Aortic valve: There was trivial regurgitation. - Left atrium: The atrium was moderately dilated. 47 mm  Impressions:  - Mild global reduction in LV function; moderate LVE; mild LVH;   trace AI; moderate LAE.  10/07/15-Cardiac catherization-Prox RCA lesion, 30 %stenosed.  RPDA lesion, 30 %stenosed.   Normal right dominant coronary arteries with false positive myovue EF 65% Only 30% proximal/mid and PLA lesions seen  Assessment and Plan: 1.  Paroxymal atrial fibrillation s/p afib ablation 10/10 Return to aflutter last two weeks with pending shoulder surgery In to discuss options going into surgery Surgeon wants off anticoagulant 5 days prior to surgery He is rate controlled and could proceed to surgery knowing that he is at small risk of stroke  off anticoagulant  Or have a cardioversion to restore SR and postpone surgery for 30 days after cardioversion He would like to pursue cardioversion Continue flecainide 100 mg bid Continue metoprolol 50 mg bid Continue apixaban 5 mg bid for chadsvasc of 1- 2 (htn, insignificant cad), no interuption x 3 weeks  Scheduled for cardioversion 3/22 Pt will not be able to  reschedule his surgery for at least 30 days post cardioversion If this cardioversion does not hold, he is interested in pursing repeat ablation at a later date. F/u in afib clinic  1 week   Geroge Baseman. [place], [facility] [address] [place], [place] [address] [phone]

## 2016-05-12 ENCOUNTER — Encounter (HOSPITAL_COMMUNITY): Payer: Self-pay | Admitting: Cardiovascular Disease

## 2016-05-18 ENCOUNTER — Other Ambulatory Visit (HOSPITAL_COMMUNITY): Payer: Self-pay | Admitting: Nurse Practitioner

## 2016-05-18 ENCOUNTER — Encounter (HOSPITAL_COMMUNITY): Payer: Self-pay | Admitting: Nurse Practitioner

## 2016-05-18 ENCOUNTER — Ambulatory Visit (HOSPITAL_COMMUNITY)
Admission: RE | Admit: 2016-05-18 | Discharge: 2016-05-18 | Disposition: A | Discharge status: Home / Self Care | Payer: BC Managed Care – PPO | Source: Ambulatory Visit | Attending: Nurse Practitioner | Admitting: Nurse Practitioner

## 2016-05-18 VITALS — BP 128/82 | HR 58 | Wt 214.2 lb

## 2016-05-18 DIAGNOSIS — Z9889 Other specified postprocedural states: Secondary | ICD-10-CM | POA: Diagnosis not present

## 2016-05-18 DIAGNOSIS — R001 Bradycardia, unspecified: Secondary | ICD-10-CM | POA: Diagnosis not present

## 2016-05-18 DIAGNOSIS — I4892 Unspecified atrial flutter: Secondary | ICD-10-CM | POA: Insufficient documentation

## 2016-05-18 DIAGNOSIS — Z87891 Personal history of nicotine dependence: Secondary | ICD-10-CM | POA: Insufficient documentation

## 2016-05-18 DIAGNOSIS — Z79899 Other long term (current) drug therapy: Secondary | ICD-10-CM | POA: Diagnosis not present

## 2016-05-18 DIAGNOSIS — Z880 Allergy status to penicillin: Secondary | ICD-10-CM | POA: Diagnosis not present

## 2016-05-18 DIAGNOSIS — I10 Essential (primary) hypertension: Secondary | ICD-10-CM | POA: Diagnosis not present

## 2016-05-18 DIAGNOSIS — Z7901 Long term (current) use of anticoagulants: Secondary | ICD-10-CM | POA: Diagnosis not present

## 2016-05-18 DIAGNOSIS — I4819 Other persistent atrial fibrillation: Secondary | ICD-10-CM

## 2016-05-18 DIAGNOSIS — I481 Persistent atrial fibrillation: Secondary | ICD-10-CM | POA: Diagnosis not present

## 2016-05-18 DIAGNOSIS — I4891 Unspecified atrial fibrillation: Secondary | ICD-10-CM | POA: Diagnosis present

## 2016-05-18 NOTE — Progress Notes (Signed)
Patient ID: Patrick Gray, male   DOB: 1955-09-15, 61 y.o.   MRN: [id number]     Primary Care Physician: Gara Kroner, MD Referring Physician: Ormand Senn is a 61 y.o. male with a h/o afib 2015 found by primary. Was placed on flecainide 100 mg bid and has done well until seen 6/20 and was noted to be in aflutter with RVR. Has noted some increase in irregular heart beat over the last few weeks. Flecainide was increased to 150 mg bid by Dr. Johnsie Cancel, 6/20, and asked to f/u here. EKG now shows SR, but he feels  fatigue. Wife states he does snore, has daytime somnolence and he drinks at least two beers a night. Minimal caffeine, no smoking. Exercises at the gym and has lost some weight but would like to lose 20 lbs more. Chadsvasc score is 1 for HTN, has been on blood thinner for 2 years, compliant without bleeding issues.Has an older brother and mother with afib, brother has had ablation. Flecainide level was checked on 150 mg bid and was in range. Echo ordered.  Follows up 7/13 and states has really been working on his lifestyle. Admits was drinking a case of beer a week and has only had two beers in two weeks. Since he has been drinking less, he is sleeping better and wife reports less snoring. He feels so much better and has great energy now and is going to there gym more. Echo did return with EF of 45-50%,  EF was 55-60% in 2015. Continues in SR, no afib episodes.  Being seeing in afib clinic due to pt c/o afib for last 10 days. Since I saw pt last, he had abnormal tress test and subsequent catherization and had insignificant 30% disease of RCA, EF was normal. He was seen in f/u by Dr. Rayann Heman and was having very little afib burden, with lifestyle changes, and it was decided that he would stay on flecainide.But Dr. Rayann Heman did mention that if afib returned he would consider ablation. Pt given option of having cardioversion and /or seeing Dr. Rayann Heman back for possible ablation. He tells me that he  has returned to SR after 2-3 weeks of being in afib and he would like to avoid DCCV and get appointment with Dr. Rayann Heman. He does have more fatigue with afib but still can continue his usual activities.  Returns to afib clinic 10/17 after having ablation 10/10. He was d/c in afib and he did go back to rhythm 10/13. Noticed another small episode a few days later but since  then has been in SR. No groin pain, no swallowing activities. He is looking forward to increasing his activities.   F/u one month, afib ablation. He is in SR. He is doing well, has not noticed any afib. No dysphagia or groin issues. He is needing surgery on his left rotator cuff and it was explained that this would have to be on hold until 3 months after the procedure, so as not to interrupt  Blood thinner following ablation.  F/u in afib clinic 3 13. He is pending shoulder surgery next week and has been in afib x 2 weeks. Flecainide was decreased to 50 mg bid and within several days he returned to afib. He is concerned as he will have to be be off blood thinner x 5 days prior to surgery. Chadsvasc score is 1- 2 (htn and minor non obstructive cad)   In afib clinic for f/u of successful cardioversion.  He is staying in [place]. He cannot interrupt anticoagulant for one month following cardioversion. At that point could have shoulder surgery and safely stop anticoagulantion for necessary period of time.   Today, he denies symptoms of palpitations, chest pain, shortness of breath, orthopnea, PND, lower extremity edema, dizziness, presyncope, syncope, or neurologic sequela. Positive for fatigue in afib , hand tremor. The patient is tolerating medications without difficulties and is otherwise without complaint today.   Past Medical History:  Diagnosis Date  . Deafness in left ear   . HTN (hypertension)   . Hypercholesterolemia   . Hypopotassemia   . Paroxysmal atrial fibrillation [facility])    Past Surgical History:  Procedure Laterality Date    . ACOUSTIC NEUROMA RESECTION Left 19990  . APPENDECTOMY  1976  . CARDIAC CATHETERIZATION N/A 10/07/2015   Procedure: Left Heart Cath and Coronary Angiography;  Surgeon: Josue Hector, MD;  Location: [place] [facility];  Service: Cardiovascular;  Laterality: N/A;  . CARDIOVERSION N/A 06/17/2013   Procedure: CARDIOVERSION;  Surgeon: Josue Hector, MD;  Location: [place];  Service: Cardiovascular;  Laterality: N/A;  . CARDIOVERSION N/A 05/11/2016   Procedure: CARDIOVERSION;  Surgeon: Sanda Klein, MD;  Location: [place] [facility]COPY;  Service: Cardiovascular;  Laterality: N/A;  . CHOLECYSTECTOMY  1976  . CLUB FOOT RELEASE  1968  . ELECTROPHYSIOLOGIC STUDY N/A 11/30/2015   Procedure: Atrial Fibrillation Ablation;  Surgeon: Thompson Grayer, MD;  Location: [place] [facility];  Service: Cardiovascular;  Laterality: N/A;  . TEE WITHOUT CARDIOVERSION N/A 05/11/2016   Procedure: TRANSESOPHAGEAL ECHOCARDIOGRAM (TEE);  Surgeon: Sanda Klein, MD;  Location: [facility];  Service: Cardiovascular;  Laterality: N/A;  . TONSILLECTOMY  1967    Current Outpatient Prescriptions  Medication Sig Dispense Refill  . amLODipine (NORVASC) 5 MG tablet Take 5 mg by mouth daily.     Marland Kitchen apixaban (ELIQUIS) 5 MG TABS tablet Take 1 tablet (5 mg total) by mouth 2 (two) times daily. 60 tablet 11  . atorvastatin (LIPITOR) 20 MG tablet Take 20 mg by mouth daily.    . Cholecalciferol (VITAMIN D3 PO) Take 1 capsule by mouth daily.    Marland Kitchen co-enzyme Q-10 30 MG capsule Take 30 mg by mouth daily.    Marland Kitchen KLOR-CON M20 20 MEQ tablet Take 40 mEq by mouth daily.     Marland Kitchen lisinopril-hydrochlorothiazide (PRINZIDE,ZESTORETIC) 20-25 MG per tablet Take 1 tablet by mouth daily.     . Magnesium Oxide (MAG-OX PO) Take 625 mg elemental calcium/kg/hr by mouth every morning.    . metoprolol (LOPRESSOR) 50 MG tablet Take 1 tablet by mouth 2 (two) times daily.    Marland Kitchen VITAMIN K PO Take 1 tablet by mouth daily.    . flecainide (TAMBOCOR) 100 MG tablet TAKE ONE  TABLET BY MOUTH TWICE DAILY 60 tablet 3   No current facility-administered medications for this encounter.     Allergies  Allergen Reactions  . Penicillins     Rash as a Child Has patient had a PCN reaction causing immediate rash, facial/tongue/throat swelling, SOB or lightheadedness with hypotension: No Has patient had a PCN reaction causing severe rash involving mucus membranes or skin necrosis: No Has patient had a PCN reaction that required hospitalization No Has patient had a PCN reaction occurring within the last 10 years: No If all of the above answers are "NO", then may proceed with Cephalosporin use.     Social History   Social History  . Marital status: Married    Spouse name:  N/A  . Number of children: N/A  . Years of education: N/A   Occupational History  . Not on file.   Social History Main Topics  . Smoking status: Former Research scientist (life sciences)  . Smokeless tobacco: Never Used  . Alcohol use Yes     Comment: previously 20 beers per week, now 2 beers per week  . Drug use: No  . Sexual activity: Not on file   Other Topics Concern  . Not on file   Social History Narrative   Lives in [place] wife.  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    Family History  Problem Relation Age of Onset  . Hypertension Father   . Hypertension Mother   . Stroke Mother   . CVA Mother   . Hypertension Brother   . Hypercholesterolemia Brother   . Atrial fibrillation Brother   . Congestive Heart Failure Brother     ROS- All systems are reviewed and negative except as per the HPI above  Physical Exam: Vitals:   05/18/16 1325  BP: 128/82  Pulse: (!) 58  SpO2: 98%  Weight: 214 lb 4 oz (97.2 kg)    GEN- The patient is well appearing, alert and oriented x 3 today.   Head- normocephalic, atraumatic Eyes-  Sclera clear, conjunctiva pink Ears- hearing intact Oropharynx- clear Neck- supple, no JVP Lymph- no cervical lymphadenopathy Lungs- Clear to ausculation bilaterally, normal  work of breathing Heart -irregular rate and rhythm, no murmurs, rubs or gallops, PMI not laterally displaced GI- soft, NT, ND, + BS Extremities- no clubbing, cyanosis, or edema MS- no significant deformity or atrophy Skin- no rash or lesion Psych- euthymic mood, full affect Neuro- strength and sensation are intact  EKG- Sinus brady, otherwise normal EKG Epic records reviewed Echo 6-17-.- Left ventricle: The cavity size was moderately dilated. Wall  thickness was increased in a pattern of mild LVH. Systolic  function was mildly reduced. The estimated ejection fraction was  in the range of 45% to 50%. Diffuse hypokinesis. - Aortic valve: There was trivial regurgitation. - Left atrium: The atrium was moderately dilated. 47 mm  Impressions:  - Mild global reduction in LV function; moderate LVE; mild LVH;   trace AI; moderate LAE.  10/07/15-Cardiac catherization-Prox RCA lesion, 30 %stenosed.  RPDA lesion, 30 %stenosed.   Normal right dominant coronary arteries with false positive myovue EF 65% Only 30% proximal/mid and PLA lesions seen  Assessment and Plan: 1.  Paroxymal atrial fibrillation s/p afib ablation 11/30/15 Successful cardioversion 3/22 Can reschedule shoulder surgery  4 weeks past cardioversion  which would be after April 19th. He then wold be able to stop anticoagulant for 2-3 days to have surgery and restart asap after surgery. Continue flecainide and metoprolol perioperatively  F/u with Dr. Rayann Heman 4/30 afib clinic as needed    Butch Penny C. Bereket Gernert, [facility] [address] [place], [place] [address] [phone]

## 2016-05-25 ENCOUNTER — Other Ambulatory Visit: Payer: Self-pay | Admitting: Orthopedic Surgery

## 2016-06-08 ENCOUNTER — Encounter (HOSPITAL_COMMUNITY): Payer: Self-pay

## 2016-06-08 ENCOUNTER — Ambulatory Visit (HOSPITAL_COMMUNITY)
Admission: RE | Admit: 2016-06-08 | Discharge: 2016-06-08 | Disposition: A | Discharge status: Home / Self Care | Payer: BC Managed Care – PPO | Source: Ambulatory Visit | Attending: Orthopedic Surgery | Admitting: Orthopedic Surgery

## 2016-06-08 ENCOUNTER — Encounter (HOSPITAL_COMMUNITY)
Admission: RE | Admit: 2016-06-08 | Discharge: 2016-06-08 | Disposition: A | Discharge status: Home / Self Care | Payer: BC Managed Care – PPO | Source: Ambulatory Visit | Attending: Orthopedic Surgery | Admitting: Orthopedic Surgery

## 2016-06-08 DIAGNOSIS — Z01818 Encounter for other preprocedural examination: Secondary | ICD-10-CM | POA: Diagnosis present

## 2016-06-08 DIAGNOSIS — Z01812 Encounter for preprocedural laboratory examination: Secondary | ICD-10-CM | POA: Insufficient documentation

## 2016-06-08 DIAGNOSIS — M19012 Primary osteoarthritis, left shoulder: Secondary | ICD-10-CM | POA: Diagnosis not present

## 2016-06-08 HISTORY — DX: Anxiety disorder, unspecified: F41.9

## 2016-06-08 HISTORY — DX: Other specified postprocedural states: Z98.890

## 2016-06-08 HISTORY — DX: Cardiac arrhythmia, unspecified: I49.9

## 2016-06-08 HISTORY — DX: Tremor, unspecified: R25.1

## 2016-06-08 LAB — URINALYSIS, ROUTINE W REFLEX MICROSCOPIC
Bacteria, UA: NONE SEEN
Bilirubin Urine: NEGATIVE
Glucose, UA: NEGATIVE mg/dL
Ketones, ur: NEGATIVE mg/dL
Leukocytes, UA: NEGATIVE
Nitrite: NEGATIVE
Protein, ur: NEGATIVE mg/dL
Specific Gravity, Urine: 1.016 (ref 1.005–1.030)
Squamous Epithelial / LPF: NONE SEEN
pH: 6 (ref 5.0–8.0)

## 2016-06-08 LAB — CBC WITH DIFFERENTIAL/PLATELET
Basophils Absolute: 0 10*3/uL (ref 0.0–0.1)
Basophils Relative: 0 %
Eosinophils Absolute: 0.1 10*3/uL (ref 0.0–0.7)
Eosinophils Relative: 3 %
HCT: 43.6 % (ref 39.0–52.0)
Hemoglobin: 15.9 g/dL (ref 13.0–17.0)
Lymphocytes Relative: 43 %
Lymphs Abs: 2 10*3/uL (ref 0.7–4.0)
MCH: 31.3 pg (ref 26.0–34.0)
MCHC: 36.5 g/dL — ABNORMAL HIGH (ref 30.0–36.0)
MCV: 85.8 fL (ref 78.0–100.0)
Monocytes Absolute: 0.5 10*3/uL (ref 0.1–1.0)
Monocytes Relative: 11 %
Neutro Abs: 2 10*3/uL (ref 1.7–7.7)
Neutrophils Relative %: 43 %
Platelets: 129 10*3/uL — ABNORMAL LOW (ref 150–400)
RBC: 5.08 MIL/uL (ref 4.22–5.81)
RDW: 12.8 % (ref 11.5–15.5)
WBC: 4.6 10*3/uL (ref 4.0–10.5)

## 2016-06-08 LAB — COMPREHENSIVE METABOLIC PANEL
ALT: 41 U/L (ref 17–63)
AST: 32 U/L (ref 15–41)
Albumin: 4.4 g/dL (ref 3.5–5.0)
Alkaline Phosphatase: 65 U/L (ref 38–126)
Anion gap: 6 (ref 5–15)
BUN: 17 mg/dL (ref 6–20)
CO2: 30 mmol/L (ref 22–32)
Calcium: 9.8 mg/dL (ref 8.9–10.3)
Chloride: 103 mmol/L (ref 101–111)
Creatinine, Ser: 0.89 mg/dL (ref 0.61–1.24)
GFR calc Af Amer: >60 mL/min (ref >60)
GFR calc non Af Amer: >60 mL/min (ref >60)
Glucose, Bld: 103 mg/dL — ABNORMAL HIGH (ref 65–99)
Potassium: 3.2 mmol/L — ABNORMAL LOW (ref 3.5–5.1)
Sodium: 139 mmol/L (ref 135–145)
Total Bilirubin: 1.7 mg/dL — ABNORMAL HIGH (ref 0.3–1.2)
Total Protein: 7.3 g/dL (ref 6.5–8.1)

## 2016-06-08 LAB — PROTIME-INR
INR: 1.19
Prothrombin Time: 15.2 seconds (ref 11.4–15.2)

## 2016-06-08 LAB — APTT: aPTT: 31 seconds (ref 24–36)

## 2016-06-08 LAB — SURGICAL PCR SCREEN
MRSA, PCR: NEGATIVE
Staphylococcus aureus: NEGATIVE

## 2016-06-08 NOTE — Pre-Procedure Instructions (Signed)
    Patrick Gray  06/08/2016      [place] ([facility] [place] [facility] [place] [address] [place] ([place]) [place] [address] Phone: [phone] Fax: [phone]    Your procedure is scheduled on 06/15/16.  Report to [facility] Admitting at 830 A.M.  Call this number if you have problems the morning of surgery:  [phone]   Remember:  Do not eat food or drink liquids after midnight.  Take these medicines the morning of surgery with A SIP OF WATER --norvasc,metoprolol   Do not wear jewelry, make-up or nail polish.  Do not wear lotions, powders, or perfumes, or deoderant.  Do not shave 48 hours prior to surgery.  Men may shave face and neck.  Do not bring valuables to the hospital.  [facility] is not responsible for any belongings or valuables.  Contacts, dentures or bridgework may not be worn into surgery.  Leave your suitcase in the car.  After surgery it may be brought to your room.  For patients admitted to the hospital, discharge time will be determined by your treatment team.  Patients discharged the day of surgery will not be allowed to drive home.   Name and phone number of your driver:   Special instructions:  Do not take any aspirin,anti-inflammatories,vitamins,or herbal supplements 5-7 days prior to surgery.  Please read over the following fact sheets that you were given. MRSA Information

## 2016-06-09 NOTE — Progress Notes (Signed)
Anesthesia Chart Review:  Pt is a 61 year old male scheduled for L total shoulder arthroplasty on 06/15/2016 with Tania Ade, MD.   - PCP is [name], MD - Cardiologist is Thompson Grayer, MD, last office visit 05/18/16 with Roderic Palau, NP  PMH includes:  PAF (s/p ablation 11/2015, s/p cardioversion 05/11/16), HTN, hyperlipidemia. Deaf in left ear. Former smoker. BMI 31.  Medications include: Amlodipine, Eliquis, Lipitor, flecainide, potassium, lisinopril-HCTZ, metoprolol. Pt reported at PAT he is to stop eliquis 06/10/16.   Preoperative labs reviewed.    CXR 06/08/16: No acute abnormality identified.  EKG 05/18/16: Sinus bradycardia (54 bpm).   TEE 05/11/16:  - Left ventricle: The cavity size was normal. Wall thickness was normal. Systolic function was at the lower limits of normal. The estimated ejection fraction was in the range of 50% to 55%. Wall motion was normal; there were no regional wall motion abnormalities. - Aortic valve: No evidence of vegetation. There was trivial regurgitation. - Mitral valve: No evidence of vegetation. There was mild regurgitation directed centrally. - Left atrium: The atrium was mildly dilated. No evidence of thrombus in the atrial cavity or appendage. No spontaneous echo contrast was observed. - Right atrium: The atrium was dilated. - Atrial septum: No defect or patent foramen ovale was identified. Echo contrast study showed no right-to-left atrial level shunt, following an increase in RA pressure induced by provocative maneuvers. - Tricuspid valve: No evidence of vegetation. - Pulmonic valve: No evidence of vegetation.  Cardiac cath 10/07/15:   Prox RCA lesion, 30 %stenosed.  RPDA lesion, 30 %stenosed. Normal right dominant coronary arteries with false positive myovue EF 65% Only 30% proximal/mid and PLA lesions seen  If no changes, I anticipate pt can proceed with surgery as scheduled.   Willeen Cass, FNP-BC [facility] Short Stay Surgical  Center/Anesthesiology Phone: [phone] 06/09/2016 4:44 PM

## 2016-06-15 ENCOUNTER — Encounter (HOSPITAL_COMMUNITY): Payer: Self-pay | Admitting: *Deleted

## 2016-06-15 ENCOUNTER — Encounter (HOSPITAL_COMMUNITY)
Admission: RE | Disposition: A | Discharge status: Home / Self Care | Payer: Self-pay | Source: Ambulatory Visit | Attending: Orthopedic Surgery

## 2016-06-15 ENCOUNTER — Inpatient Hospital Stay (HOSPITAL_COMMUNITY): Payer: BC Managed Care – PPO | Admitting: Anesthesiology

## 2016-06-15 ENCOUNTER — Inpatient Hospital Stay (HOSPITAL_COMMUNITY): Payer: BC Managed Care – PPO | Admitting: Emergency Medicine

## 2016-06-15 ENCOUNTER — Inpatient Hospital Stay (HOSPITAL_COMMUNITY): Payer: BC Managed Care – PPO

## 2016-06-15 ENCOUNTER — Inpatient Hospital Stay (HOSPITAL_COMMUNITY)
Admission: RE | Admit: 2016-06-15 | Discharge: 2016-06-16 | DRG: 483 | Disposition: A | Discharge status: Home / Self Care | Payer: BC Managed Care – PPO | Source: Ambulatory Visit | Attending: Orthopedic Surgery | Admitting: Orthopedic Surgery

## 2016-06-15 DIAGNOSIS — Z87891 Personal history of nicotine dependence: Secondary | ICD-10-CM

## 2016-06-15 DIAGNOSIS — Z79899 Other long term (current) drug therapy: Secondary | ICD-10-CM

## 2016-06-15 DIAGNOSIS — E7800 Pure hypercholesterolemia, unspecified: Secondary | ICD-10-CM | POA: Diagnosis present

## 2016-06-15 DIAGNOSIS — I10 Essential (primary) hypertension: Secondary | ICD-10-CM | POA: Diagnosis present

## 2016-06-15 DIAGNOSIS — H9192 Unspecified hearing loss, left ear: Secondary | ICD-10-CM | POA: Diagnosis present

## 2016-06-15 DIAGNOSIS — I480 Paroxysmal atrial fibrillation: Secondary | ICD-10-CM | POA: Diagnosis present

## 2016-06-15 DIAGNOSIS — Z880 Allergy status to penicillin: Secondary | ICD-10-CM | POA: Diagnosis not present

## 2016-06-15 DIAGNOSIS — Z96612 Presence of left artificial shoulder joint: Secondary | ICD-10-CM

## 2016-06-15 DIAGNOSIS — Z7901 Long term (current) use of anticoagulants: Secondary | ICD-10-CM

## 2016-06-15 DIAGNOSIS — M19012 Primary osteoarthritis, left shoulder: Secondary | ICD-10-CM | POA: Diagnosis present

## 2016-06-15 HISTORY — PX: TOTAL SHOULDER ARTHROPLASTY: SHX126

## 2016-06-15 SURGERY — ARTHROPLASTY, SHOULDER, TOTAL
Anesthesia: General | Laterality: Left

## 2016-06-15 MED ORDER — METOPROLOL TARTRATE 50 MG PO TABS
50.0000 mg | ORAL_TABLET | Freq: Two times a day (BID) | ORAL | Status: DC
  Administered 2016-06-15 – 2016-06-16 (×2): 50 mg via ORAL
  Filled 2016-06-15 (×2): qty 1

## 2016-06-15 MED ORDER — ONDANSETRON HCL 4 MG PO TABS: 4.0000 mg | ORAL_TABLET | Freq: Four times a day (QID) | ORAL | Status: DC | PRN

## 2016-06-15 MED ORDER — LIDOCAINE 2% (20 MG/ML) 5 ML SYRINGE
INTRAMUSCULAR | Status: AC
  Filled 2016-06-15: qty 5

## 2016-06-15 MED ORDER — POTASSIUM CHLORIDE CRYS ER 20 MEQ PO TBCR
40.0000 meq | EXTENDED_RELEASE_TABLET | Freq: Every day | ORAL | Status: DC
  Administered 2016-06-16: 40 meq via ORAL
  Filled 2016-06-15: qty 2

## 2016-06-15 MED ORDER — BUPIVACAINE-EPINEPHRINE (PF) 0.5% -1:200000 IJ SOLN
INTRAMUSCULAR | Status: DC | PRN
  Administered 2016-06-15: 20 mL via PERINEURAL

## 2016-06-15 MED ORDER — MIDAZOLAM HCL 2 MG/2ML IJ SOLN
2.0000 mg | Freq: Once | INTRAMUSCULAR | Status: DC
Start: 2016-06-15 — End: 2016-06-15

## 2016-06-15 MED ORDER — EPHEDRINE 5 MG/ML INJ
INTRAVENOUS | Status: AC
  Filled 2016-06-15: qty 10

## 2016-06-15 MED ORDER — METOCLOPRAMIDE HCL 5 MG PO TABS: 5.0000 mg | ORAL_TABLET | Freq: Three times a day (TID) | ORAL | Status: DC | PRN

## 2016-06-15 MED ORDER — ROCURONIUM BROMIDE 100 MG/10ML IV SOLN
INTRAVENOUS | Status: DC | PRN
  Administered 2016-06-15: 50 mg via INTRAVENOUS

## 2016-06-15 MED ORDER — ROCURONIUM BROMIDE 10 MG/ML (PF) SYRINGE
PREFILLED_SYRINGE | INTRAVENOUS | Status: AC
  Filled 2016-06-15: qty 5

## 2016-06-15 MED ORDER — DIPHENHYDRAMINE HCL 12.5 MG/5ML PO ELIX: 12.5000 mg | ORAL_SOLUTION | ORAL | Status: DC | PRN

## 2016-06-15 MED ORDER — BUPIVACAINE LIPOSOME 1.3 % IJ SUSP
INTRAMUSCULAR | Status: DC | PRN
  Administered 2016-06-15: 20 mL

## 2016-06-15 MED ORDER — DOCUSATE SODIUM 100 MG PO CAPS: 100.0000 mg | ORAL_CAPSULE | Freq: Three times a day (TID) | ORAL | 0 refills | Status: DC | PRN

## 2016-06-15 MED ORDER — HYDROCHLOROTHIAZIDE 25 MG PO TABS
25.0000 mg | ORAL_TABLET | Freq: Every day | ORAL | Status: DC
  Administered 2016-06-15 – 2016-06-16 (×2): 25 mg via ORAL
  Filled 2016-06-15: qty 1

## 2016-06-15 MED ORDER — PROPOFOL 10 MG/ML IV BOLUS
INTRAVENOUS | Status: DC | PRN
  Administered 2016-06-15: 140 mg via INTRAVENOUS

## 2016-06-15 MED ORDER — CEFAZOLIN SODIUM-DEXTROSE 2-4 GM/100ML-% IV SOLN
2.0000 g | Freq: Four times a day (QID) | INTRAVENOUS | Status: AC
  Administered 2016-06-15 – 2016-06-16 (×3): 2 g via INTRAVENOUS
  Filled 2016-06-15 (×3): qty 100

## 2016-06-15 MED ORDER — ACETAMINOPHEN 650 MG RE SUPP: 650.0000 mg | Freq: Four times a day (QID) | RECTAL | Status: DC | PRN

## 2016-06-15 MED ORDER — PROPOFOL 10 MG/ML IV BOLUS
INTRAVENOUS | Status: AC
Start: 2016-06-15 — End: 2016-06-15
  Filled 2016-06-15: qty 20

## 2016-06-15 MED ORDER — EPHEDRINE SULFATE-NACL 50-0.9 MG/10ML-% IV SOSY
PREFILLED_SYRINGE | INTRAVENOUS | Status: DC | PRN
  Administered 2016-06-15 (×2): 5 mg via INTRAVENOUS
  Administered 2016-06-15: 10 mg via INTRAVENOUS

## 2016-06-15 MED ORDER — METOCLOPRAMIDE HCL 5 MG/ML IJ SOLN
5.0000 mg | Freq: Three times a day (TID) | INTRAMUSCULAR | Status: DC | PRN
  Administered 2016-06-16: 10 mg via INTRAVENOUS
  Filled 2016-06-15: qty 2

## 2016-06-15 MED ORDER — HYDROMORPHONE HCL 1 MG/ML IJ SOLN: 0.2500 mg | INTRAMUSCULAR | Status: DC | PRN

## 2016-06-15 MED ORDER — VANCOMYCIN HCL IN DEXTROSE 1-5 GM/200ML-% IV SOLN
1000.0000 mg | INTRAVENOUS | Status: AC
  Administered 2016-06-15 (×2): 1000 mg via INTRAVENOUS
  Filled 2016-06-15: qty 200

## 2016-06-15 MED ORDER — PHENOL 1.4 % MT LIQD: 1.0000 | OROMUCOSAL | Status: DC | PRN

## 2016-06-15 MED ORDER — FLECAINIDE ACETATE 100 MG PO TABS
100.0000 mg | ORAL_TABLET | Freq: Two times a day (BID) | ORAL | Status: DC
  Administered 2016-06-16 (×2): 100 mg via ORAL
  Filled 2016-06-15 (×2): qty 1

## 2016-06-15 MED ORDER — POLYETHYLENE GLYCOL 3350 17 G PO PACK: 17.0000 g | PACK | Freq: Every day | ORAL | Status: DC | PRN

## 2016-06-15 MED ORDER — BUPIVACAINE LIPOSOME 1.3 % IJ SUSP
20.0000 mL | INTRAMUSCULAR | Status: DC
  Filled 2016-06-15: qty 20

## 2016-06-15 MED ORDER — SODIUM CHLORIDE 0.9 % IV SOLN
INTRAVENOUS | Status: DC
  Administered 2016-06-15 – 2016-06-16 (×2): via INTRAVENOUS

## 2016-06-15 MED ORDER — FENTANYL CITRATE (PF) 100 MCG/2ML IJ SOLN
INTRAMUSCULAR | Status: DC | PRN
  Administered 2016-06-15: 50 ug via INTRAVENOUS

## 2016-06-15 MED ORDER — AMLODIPINE BESYLATE 5 MG PO TABS
5.0000 mg | ORAL_TABLET | Freq: Every day | ORAL | Status: DC
Start: 2016-06-16 — End: 2016-06-16
  Administered 2016-06-16: 5 mg via ORAL
  Filled 2016-06-15: qty 1

## 2016-06-15 MED ORDER — SODIUM CHLORIDE 0.9 % IV SOLN
1000.0000 mg | INTRAVENOUS | Status: AC
  Administered 2016-06-15: 1000 mg via INTRAVENOUS
  Filled 2016-06-15: qty 10

## 2016-06-15 MED ORDER — PROMETHAZINE HCL 25 MG/ML IJ SOLN: 6.2500 mg | INTRAMUSCULAR | Status: DC | PRN

## 2016-06-15 MED ORDER — HYDROCHLOROTHIAZIDE 25 MG PO TABS
25.0000 mg | ORAL_TABLET | Freq: Every day | ORAL | Status: DC
  Filled 2016-06-15: qty 1

## 2016-06-15 MED ORDER — SODIUM CHLORIDE 0.9% FLUSH
INTRAVENOUS | Status: DC | PRN
  Administered 2016-06-15: 40 mL via INTRAVENOUS

## 2016-06-15 MED ORDER — MENTHOL 3 MG MT LOZG: 1.0000 | LOZENGE | OROMUCOSAL | Status: DC | PRN

## 2016-06-15 MED ORDER — ALUM & MAG HYDROXIDE-SIMETH 200-200-20 MG/5ML PO SUSP: 30.0000 mL | ORAL | Status: DC | PRN

## 2016-06-15 MED ORDER — DOCUSATE SODIUM 100 MG PO CAPS
100.0000 mg | ORAL_CAPSULE | Freq: Two times a day (BID) | ORAL | Status: DC
  Administered 2016-06-15 – 2016-06-16 (×2): 100 mg via ORAL
  Filled 2016-06-15 (×2): qty 1

## 2016-06-15 MED ORDER — SUGAMMADEX SODIUM 200 MG/2ML IV SOLN
INTRAVENOUS | Status: AC
  Filled 2016-06-15: qty 2

## 2016-06-15 MED ORDER — VITAMIN K2 100 MCG PO CAPS: 100.0000 ug | ORAL_CAPSULE | Freq: Every day | ORAL | Status: DC

## 2016-06-15 MED ORDER — MORPHINE SULFATE (PF) 4 MG/ML IV SOLN: 1.0000 mg | INTRAVENOUS | Status: DC | PRN

## 2016-06-15 MED ORDER — ONDANSETRON HCL 4 MG/2ML IJ SOLN
INTRAMUSCULAR | Status: DC | PRN
  Administered 2016-06-15: 4 mg via INTRAVENOUS

## 2016-06-15 MED ORDER — POVIDONE-IODINE 7.5 % EX SOLN: Freq: Once | CUTANEOUS | Status: DC

## 2016-06-15 MED ORDER — METHOCARBAMOL 1000 MG/10ML IJ SOLN
500.0000 mg | Freq: Four times a day (QID) | INTRAVENOUS | Status: DC | PRN
  Filled 2016-06-15: qty 5

## 2016-06-15 MED ORDER — PHENYLEPHRINE HCL 10 MG/ML IJ SOLN
INTRAVENOUS | Status: DC | PRN
  Administered 2016-06-15: 25 ug/min via INTRAVENOUS

## 2016-06-15 MED ORDER — FENTANYL CITRATE (PF) 100 MCG/2ML IJ SOLN
INTRAMUSCULAR | Status: AC
  Administered 2016-06-15: 50 ug
  Filled 2016-06-15: qty 2

## 2016-06-15 MED ORDER — ATORVASTATIN CALCIUM 20 MG PO TABS
20.0000 mg | ORAL_TABLET | Freq: Every day | ORAL | Status: DC
  Administered 2016-06-15: 20 mg via ORAL
  Filled 2016-06-15: qty 1

## 2016-06-15 MED ORDER — BISACODYL 5 MG PO TBEC: 5.0000 mg | DELAYED_RELEASE_TABLET | Freq: Every day | ORAL | Status: DC | PRN

## 2016-06-15 MED ORDER — FLEET ENEMA 7-19 GM/118ML RE ENEM: 1.0000 | ENEMA | Freq: Once | RECTAL | Status: DC | PRN

## 2016-06-15 MED ORDER — LISINOPRIL-HYDROCHLOROTHIAZIDE 20-25 MG PO TABS: 1.0000 | ORAL_TABLET | Freq: Every day | ORAL | Status: DC

## 2016-06-15 MED ORDER — ONDANSETRON HCL 4 MG/2ML IJ SOLN
INTRAMUSCULAR | Status: AC
  Filled 2016-06-15: qty 2

## 2016-06-15 MED ORDER — LISINOPRIL 20 MG PO TABS
20.0000 mg | ORAL_TABLET | Freq: Every day | ORAL | Status: DC
  Filled 2016-06-15: qty 1

## 2016-06-15 MED ORDER — 0.9 % SODIUM CHLORIDE (POUR BTL) OPTIME
TOPICAL | Status: DC | PRN
  Administered 2016-06-15: 1000 mL

## 2016-06-15 MED ORDER — LACTATED RINGERS IV SOLN: INTRAVENOUS | Status: DC

## 2016-06-15 MED ORDER — METHOCARBAMOL 500 MG PO TABS
500.0000 mg | ORAL_TABLET | Freq: Four times a day (QID) | ORAL | Status: DC | PRN
  Administered 2016-06-15 – 2016-06-16 (×2): 500 mg via ORAL
  Filled 2016-06-15 (×2): qty 1

## 2016-06-15 MED ORDER — DEXAMETHASONE SODIUM PHOSPHATE 10 MG/ML IJ SOLN
INTRAMUSCULAR | Status: AC
  Filled 2016-06-15: qty 1

## 2016-06-15 MED ORDER — OXYCODONE HCL 5 MG PO TABS
5.0000 mg | ORAL_TABLET | ORAL | Status: DC | PRN
  Administered 2016-06-15 – 2016-06-16 (×3): 10 mg via ORAL
  Administered 2016-06-16: 5 mg via ORAL
  Administered 2016-06-16: 10 mg via ORAL
  Filled 2016-06-15: qty 2
  Filled 2016-06-15: qty 1
  Filled 2016-06-15 (×3): qty 2

## 2016-06-15 MED ORDER — SUGAMMADEX SODIUM 200 MG/2ML IV SOLN
INTRAVENOUS | Status: DC | PRN
  Administered 2016-06-15: 200 mg via INTRAVENOUS

## 2016-06-15 MED ORDER — APIXABAN 5 MG PO TABS
5.0000 mg | ORAL_TABLET | Freq: Two times a day (BID) | ORAL | Status: DC
  Administered 2016-06-16: 5 mg via ORAL
  Filled 2016-06-15: qty 1

## 2016-06-15 MED ORDER — FENTANYL CITRATE (PF) 100 MCG/2ML IJ SOLN: 100.0000 ug | Freq: Once | INTRAMUSCULAR | Status: DC

## 2016-06-15 MED ORDER — LACTATED RINGERS IV SOLN
INTRAVENOUS | Status: DC
  Administered 2016-06-15: 09:00:00 via INTRAVENOUS

## 2016-06-15 MED ORDER — ACETAMINOPHEN 325 MG PO TABS: 650.0000 mg | ORAL_TABLET | Freq: Four times a day (QID) | ORAL | Status: DC | PRN

## 2016-06-15 MED ORDER — FENTANYL CITRATE (PF) 250 MCG/5ML IJ SOLN
INTRAMUSCULAR | Status: AC
Start: 2016-06-15 — End: 2016-06-15
  Filled 2016-06-15: qty 5

## 2016-06-15 MED ORDER — ACETAMINOPHEN 500 MG PO TABS
1000.0000 mg | ORAL_TABLET | Freq: Four times a day (QID) | ORAL | Status: DC
  Administered 2016-06-15 – 2016-06-16 (×3): 1000 mg via ORAL
  Filled 2016-06-15 (×3): qty 2

## 2016-06-15 MED ORDER — LIDOCAINE HCL (CARDIAC) 20 MG/ML IV SOLN
INTRAVENOUS | Status: DC | PRN
  Administered 2016-06-15: 60 mg via INTRAVENOUS

## 2016-06-15 MED ORDER — DEXAMETHASONE SODIUM PHOSPHATE 10 MG/ML IJ SOLN
INTRAMUSCULAR | Status: DC | PRN
  Administered 2016-06-15: 10 mg via INTRAVENOUS

## 2016-06-15 MED ORDER — SODIUM CHLORIDE 0.9 % IR SOLN
Status: DC | PRN
  Administered 2016-06-15: 3000 mL

## 2016-06-15 MED ORDER — OXYCODONE-ACETAMINOPHEN 5-325 MG PO TABS: 1.0000 | ORAL_TABLET | ORAL | 0 refills | Status: DC | PRN

## 2016-06-15 MED ORDER — LISINOPRIL 20 MG PO TABS
20.0000 mg | ORAL_TABLET | Freq: Every day | ORAL | Status: DC
  Administered 2016-06-15 – 2016-06-16 (×2): 20 mg via ORAL
  Filled 2016-06-15: qty 1

## 2016-06-15 MED ORDER — MIDAZOLAM HCL 2 MG/2ML IJ SOLN
INTRAMUSCULAR | Status: AC
  Administered 2016-06-15: 2 mg
  Filled 2016-06-15: qty 2

## 2016-06-15 MED ORDER — MEPERIDINE HCL 25 MG/ML IJ SOLN: 6.2500 mg | INTRAMUSCULAR | Status: DC | PRN

## 2016-06-15 MED ORDER — ZOLPIDEM TARTRATE 5 MG PO TABS
5.0000 mg | ORAL_TABLET | Freq: Every evening | ORAL | Status: DC | PRN
  Administered 2016-06-16: 5 mg via ORAL
  Filled 2016-06-15: qty 1

## 2016-06-15 MED ORDER — ONDANSETRON HCL 4 MG/2ML IJ SOLN: 4.0000 mg | Freq: Four times a day (QID) | INTRAMUSCULAR | Status: DC | PRN

## 2016-06-15 SURGICAL SUPPLY — 71 items
AUGMENT GLENOID REVISION SM RT (Shoulder) ×1 IMPLANT
BIT DRILL 5/64X5 DISP (BIT) IMPLANT
BLADE SAW SAG 73X25 THK (BLADE) ×1
BLADE SAW SGTL 73X25 THK (BLADE) ×1 IMPLANT
BLADE SURG 15 STRL LF DISP TIS (BLADE) ×1 IMPLANT
BLADE SURG 15 STRL SS (BLADE) ×1
CAP SHLDR PARTIAL 2 ×2 IMPLANT
CEMENT BONE DEPUY (Cement) ×2 IMPLANT
CHLORAPREP W/TINT 26ML (MISCELLANEOUS) ×2 IMPLANT
COVER SURGICAL LIGHT HANDLE (MISCELLANEOUS) ×2 IMPLANT
DRAPE INCISE IOBAN 66X45 STRL (DRAPES) ×2 IMPLANT
DRAPE ORTHO SPLIT 77X108 STRL (DRAPES) ×2
DRAPE SURG 17X23 STRL (DRAPES) ×2 IMPLANT
DRAPE SURG ORHT 6 SPLT 77X108 (DRAPES) ×2 IMPLANT
DRAPE U-SHAPE 47X51 STRL (DRAPES) ×2 IMPLANT
DRESSING AQUACEL AG SP 3.5X10 (GAUZE/BANDAGES/DRESSINGS) ×1 IMPLANT
DRSG AQUACEL AG ADV 3.5X10 (GAUZE/BANDAGES/DRESSINGS) IMPLANT
DRSG AQUACEL AG SP 3.5X10 (GAUZE/BANDAGES/DRESSINGS) ×2
ELECT BLADE 4.0 EZ CLEAN MEGAD (MISCELLANEOUS)
ELECT REM PT RETURN 9FT ADLT (ELECTROSURGICAL) ×2
ELECTRODE BLDE 4.0 EZ CLN MEGD (MISCELLANEOUS) IMPLANT
ELECTRODE REM PT RTRN 9FT ADLT (ELECTROSURGICAL) ×1 IMPLANT
EVACUATOR 1/8 PVC DRAIN (DRAIN) IMPLANT
GLOVE BIO SURGEON STRL SZ7 (GLOVE) ×2 IMPLANT
GLOVE BIO SURGEON STRL SZ7.5 (GLOVE) ×2 IMPLANT
GLOVE BIOGEL PI IND STRL 7.0 (GLOVE) ×1 IMPLANT
GLOVE BIOGEL PI IND STRL 8 (GLOVE) ×1 IMPLANT
GLOVE BIOGEL PI INDICATOR 7.0 (GLOVE) ×1
GLOVE BIOGEL PI INDICATOR 8 (GLOVE) ×1
GOWN STRL REUS W/ TWL LRG LVL3 (GOWN DISPOSABLE) ×1 IMPLANT
GOWN STRL REUS W/ TWL XL LVL3 (GOWN DISPOSABLE) ×1 IMPLANT
GOWN STRL REUS W/TWL LRG LVL3 (GOWN DISPOSABLE) ×1
GOWN STRL REUS W/TWL XL LVL3 (GOWN DISPOSABLE) ×1
HANDPIECE INTERPULSE COAX TIP (DISPOSABLE) ×1
HEMOSTAT SURGICEL 2X14 (HEMOSTASIS) ×2 IMPLANT
HOOD PEEL AWAY FLYTE STAYCOOL (MISCELLANEOUS) ×4 IMPLANT
KIT BASIN OR (CUSTOM PROCEDURE TRAY) ×2 IMPLANT
KIT ROOM TURNOVER OR (KITS) ×2 IMPLANT
MANIFOLD NEPTUNE II (INSTRUMENTS) ×2 IMPLANT
NEEDLE HYPO 25GX1X1/2 BEV (NEEDLE) IMPLANT
NEEDLE MAYO TROCAR (NEEDLE) ×2 IMPLANT
NEEDLE SPNL 18GX3.5 QUINCKE PK (NEEDLE) ×4 IMPLANT
NS IRRIG 1000ML POUR BTL (IV SOLUTION) ×2 IMPLANT
PACK SHOULDER (CUSTOM PROCEDURE TRAY) ×2 IMPLANT
PAD ARMBOARD 7.5X6 YLW CONV (MISCELLANEOUS) ×4 IMPLANT
RESTRAINT HEAD UNIVERSAL NS (MISCELLANEOUS) ×2 IMPLANT
RETRIEVER SUT HEWSON (MISCELLANEOUS) ×2 IMPLANT
REVISION GLENOID AUGMENT SM RT (Shoulder) ×2 IMPLANT
SET HNDPC FAN SPRY TIP SCT (DISPOSABLE) ×1 IMPLANT
SLING ARM FOAM STRAP LRG (SOFTGOODS) ×2 IMPLANT
SLING ARM FOAM STRAP MED (SOFTGOODS) IMPLANT
SMARTMIX MINI TOWER (MISCELLANEOUS) ×2
SPONGE LAP 18X18 X RAY DECT (DISPOSABLE) ×2 IMPLANT
SPONGE LAP 4X18 X RAY DECT (DISPOSABLE) IMPLANT
STRIP CLOSURE SKIN 1/2X4 (GAUZE/BANDAGES/DRESSINGS) ×2 IMPLANT
SUCTION FRAZIER HANDLE 10FR (MISCELLANEOUS) ×1
SUCTION TUBE FRAZIER 10FR DISP (MISCELLANEOUS) ×1 IMPLANT
SUPPORT WRAP ARM LG (MISCELLANEOUS) ×2 IMPLANT
SUT ETHIBOND NAB CT1 #1 30IN (SUTURE) ×6 IMPLANT
SUT MNCRL AB 4-0 PS2 18 (SUTURE) ×2 IMPLANT
SUT SILK 2 0 TIES 17X18 (SUTURE)
SUT SILK 2-0 18XBRD TIE BLK (SUTURE) IMPLANT
SUT VIC AB 2-0 CT1 27 (SUTURE) ×1
SUT VIC AB 2-0 CT1 TAPERPNT 27 (SUTURE) ×1 IMPLANT
SYR 50ML LL SCALE MARK (SYRINGE) ×4 IMPLANT
SYR CONTROL 10ML LL (SYRINGE) IMPLANT
TAPE LABRALWHITE 1.5X36 (TAPE) ×4 IMPLANT
TAPE SUT LABRALTAP WHT/BLK (SUTURE) ×2 IMPLANT
TOWEL OR 17X24 6PK STRL BLUE (TOWEL DISPOSABLE) ×2 IMPLANT
TOWEL OR 17X26 10 PK STRL BLUE (TOWEL DISPOSABLE) ×2 IMPLANT
TOWER SMARTMIX MINI (MISCELLANEOUS) ×1 IMPLANT

## 2016-06-15 NOTE — Discharge Instructions (Signed)
Discharge Instructions after Total Shoulder Arthroplasty   A sling has been provided for you. Remove the sling 5 times each day to perform motion exercises. Use the sling until your first visit with Dr. Tamera Punt.  Use ice on the shoulder intermittently over the first 48 hours after surgery.  Pain medication has been prescribed for you.  Use your medication liberally over the first 48 hours, and then begin to taper your use. You may take Extra Strength Tylenol or Tylenol only in place of the pain pills. DO NOT take ANY nonsteroidal anti-inflammatory pain medications: Advil, Motrin, Ibuprofen, Aleve, Naproxen, or Naprosyn. Take one aspirin a day for 2 weeks after surgery, unless you have an aspirin sensitivity/allergy or asthma. Leave your dressing on until your first follow up visit.  You may shower with the dressing.  Hold your arm as if you still have your sling on while you shower. Active reaching and lifting are not permitted. You may use the operative arm for activities of daily living that do not require the operative arm to leave the side of the body, such as eating, drinking, bathing, etc.  Three to 5 times each day you should perform assisted overhead reaching and external rotation (outward turning) exercises with the operative arm. You were taught these exercises prior to discharge. Both exercises should be done with the non-operative arm used as the "therapist arm" while the operative arm remains relaxed. Ten of each exercise should be done three to five times each day.   Overhead reach is helping to lift your stiff arm up as high as it will go. To stretch your overhead reach, lie flat on your back, relax, and grasp the wrist of the tight shoulder with your opposite hand. Using the power in your opposite arm, bring the stiff arm up as far as it is comfortable. Start holding it for ten seconds and then work up to where you can hold it for a count of 30. Breathe slowly and deeply while the arm  is moved. Repeat this stretch ten times, trying to help the ar up a little higher each time.     External rotation is turning the arm out to the side while your elbow stays close to your body. External rotation is best stretched while you are lying on your back. Hold a cane, yardstick, broom handle, or dowel in both hands. Bend both elbows to a right angle. Use steady, gentle force from your normal arm to rotate the hand of the stiff shoulder out away from your body. Continue the rotation as far as it will go comfortably, holding it there for a count of 10. Repeat this exercise ten times.      Please call [phone] during normal business hours or [phone] after hours for any problems. Including the following:  - excessive redness of the incisions - drainage for more than 4 days - fever of more than 101.5 F  *Please note that pain medications will not be refilled after hours or on weekends.

## 2016-06-15 NOTE — Anesthesia Preprocedure Evaluation (Addendum)
Anesthesia Evaluation  Patient identified by MRN, date of birth, ID band Patient awake    Reviewed: Allergy & Precautions, NPO status , Patient's Chart, lab work & pertinent test results  Airway Mallampati: II  TM Distance: >3 FB Neck ROM: Full    Dental  (+) Teeth Intact, Dental Advisory Given   Pulmonary former smoker,    breath sounds clear to auscultation       Cardiovascular hypertension, Pt. on medications and Pt. on home beta blockers + dysrhythmias Atrial Fibrillation  Rhythm:Regular Rate:Normal     Neuro/Psych Anxiety negative neurological ROS     GI/Hepatic negative GI ROS, Neg liver ROS,   Endo/Other  negative endocrine ROS  Renal/GU negative Renal ROS  negative genitourinary   Musculoskeletal negative musculoskeletal ROS (+)   Abdominal   Peds negative pediatric ROS (+)  Hematology negative hematology ROS (+)   Anesthesia Other Findings - HLD - L arm resting tremor  Reproductive/Obstetrics negative OB ROS                            Anesthesia Physical Anesthesia Plan  ASA: II  Anesthesia Plan: General   Post-op Pain Management:  Regional for Post-op pain   Induction: Intravenous  Airway Management Planned: Oral ETT  Additional Equipment:   Intra-op Plan:   Post-operative Plan: Extubation in OR  Informed Consent: I have reviewed the patients History and Physical, chart, labs and discussed the procedure including the risks, benefits and alternatives for the proposed anesthesia with the patient or authorized representative who has indicated his/her understanding and acceptance.   Dental advisory given  Plan Discussed with: CRNA  Anesthesia Plan Comments:         Anesthesia Quick Evaluation

## 2016-06-15 NOTE — Progress Notes (Addendum)
Arrived to unit from PACU in stable condition @ approx 1450.  VSS.  Oriented to room and unit.  Left shoulder dressing with small amount of drainage noted; outlined drainage for monitoring purposes.  Sling to left upper extremity in place.  Fresh ice pack placed to site.  Denies pain upon arrival.  Educated pt & wife on all post-op orders/medications with understanding verbalized.  Will continue to monitor.

## 2016-06-15 NOTE — Progress Notes (Signed)
Due to history of PAF s/p cardioversion 05/11/16, PA called to inquire about tele monitoring.  Verbal order received from [place], D., PA to place continuous tele monitoring.

## 2016-06-15 NOTE — Anesthesia Procedure Notes (Signed)
Procedure Name: Intubation Date/Time: 06/15/2016 10:12 AM Performed by: Kyung Rudd Pre-anesthesia Checklist: Patient identified, Emergency Drugs available, Suction available and Patient being monitored Patient Re-evaluated:Patient Re-evaluated prior to inductionOxygen Delivery Method: Circle system utilized Preoxygenation: Pre-oxygenation with 100% oxygen Intubation Type: IV induction Ventilation: Mask ventilation without difficulty Laryngoscope Size: Mac and 4 Grade View: Grade I Tube type: Oral Tube size: 7.5 mm Number of attempts: 1 Airway Equipment and Method: Stylet Placement Confirmation: ETT inserted through vocal cords under direct vision,  positive ETCO2 and breath sounds checked- equal and bilateral Secured at: 21 cm Tube secured with: Tape Dental Injury: Teeth and Oropharynx as per pre-operative assessment

## 2016-06-15 NOTE — Anesthesia Postprocedure Evaluation (Addendum)
Anesthesia Post Note  Patient: Patrick Gray  Procedure(s) Performed: Procedure(s) (LRB): TOTAL SHOULDER ARTHROPLASTY (Left)  Patient location during evaluation: PACU Anesthesia Type: General and Regional Level of consciousness: awake and alert Pain management: pain level controlled Vital Signs Assessment: post-procedure vital signs reviewed and stable Respiratory status: spontaneous breathing, nonlabored ventilation, respiratory function stable and patient connected to nasal cannula oxygen Cardiovascular status: blood pressure returned to baseline and stable Postop Assessment: no signs of nausea or vomiting Anesthetic complications: no       Last Vitals:  Vitals:   06/15/16 1430 06/15/16 1451  BP: 133/87 129/79  Pulse: 77 80  Resp: 13   Temp: 36.6 C 36.7 C    Last Pain:  Vitals:   06/15/16 1451  TempSrc: Oral  PainSc:                  [name]

## 2016-06-15 NOTE — Op Note (Addendum)
Procedure(s): TOTAL SHOULDER ARTHROPLASTY Procedure Note  LYNNE RIGHI male 61 y.o. 06/15/2016  Procedure(s) and Anesthesia Type:    * LEFT TOTAL SHOULDER ARTHROPLASTY with augmented glenoid - General       LEFT BICEPS TENODESIS  Surgeon(s) and Role:    * Tania Ade, MD - Primary   Indications:  61 y.o. male  With endstage left shoulder arthritis. Pain and dysfunction interfered with quality of life and nonoperative treatment with activity modification, NSAIDS and injections failed.     Surgeon: Nita Sells   Assistants: Jeanmarie Hubert PA-C [facility] was present and scrubbed throughout the procedure and was essential in positioning, retraction, exposure, and closure)  Anesthesia: General endotracheal anesthesia with preoperative interscalene block given by the attending anesthesiologist   Procedure Detail  TOTAL SHOULDER ARTHROPLASTY  Findings: Tornier flex anatomic press-fit size 4 stem with a 52 x 23 head, cemented size 25 large perform plus Cortiloc glenoid.  A lesser tuberosity osteotomy was performed and repaired at the conclusion of the procedure. The biceps tendon was noted to have significant degeneration longitudinal tearing. It was released from the superior labrum and tenodesed to the upper border of the pectoralis major.  Estimated Blood Loss:  200 mL         Drains: None   Blood Given: none          Specimens: none        Complications:  * No complications entered in OR log *         Disposition: PACU - hemodynamically stable.         Condition: stable    Procedure:   The patient was identified in the preoperative holding area where I personally marked the operative extremity after verifying with the patient and consent. He  was taken to the operating room where He was transferred to the   operative table.  The patient received an interscalene block in   the holding area by the attending anesthesiologist.  General anesthesia was  induced   in the operating room without complication.  The patient did receive IV  Ancef prior to the commencement of the procedure.  The patient was   placed in the beach-chair position with the back raised about 30   degrees.  The nonoperative extremity and head and neck were carefully   positioned and padded protecting against neurovascular compromise.  The   left upper extremity was then prepped and draped in the standard sterile   fashion.    The appropriate operative time-out was performed with   Anesthesia, the perioperative staff, as well as myself and we all agreed   that the left side was the correct operative site.  An approximately   10 cm incision was made from the tip of the coracoid to the center point of the   humerus at the level of the axilla.  Dissection was carried down sharply   through subcutaneous tissues and cephalic vein was identified and taken   laterally with the deltoid.  The pectoralis major was taken medially.  The   upper 1 cm of the pectoralis major was released from its attachment on   the humerus.  The clavipectoral fascia was incised just lateral to the   conjoined tendon.  This incision was carried up to but not into the   coracoacromial ligament.  Digital palpation was used to prove   integrity of the axillary nerve which was protected throughout the   procedure.  Musculocutaneous nerve was  not palpated in the operative   field.  Conjoined tendon was then retracted gently medially and the   deltoid laterally.  Anterior circumflex humeral vessels were clamped and   coagulated.  The soft tissues overlying the biceps was incised and this   incision was carried across the transverse humeral ligament to the base   of the coracoid.  The biceps was noted to be severely tendon hepatic with longitudinal split tears. It was released from the superior labrum. The biceps was tenodesed to the soft tissue just above   pectoralis major and the remaining portion of the  biceps superiorly was   excised.  An osteotomy was performed at the lesser tuberosity.  Capsule was then   released all the way down to the 6 o'clock position of the humeral head.   The humeral head was then delivered with simultaneous adduction,   extension and external rotation.  All humeral osteophytes were removed   and the anatomic neck of the humerus was marked and cut free hand at   approximately 25 degrees retroversion within about 3 mm of the cuff   reflection posteriorly.  The head size was estimated to be a 52 medium   offset.  At that point, the humeral head was retracted posteriorly with   a Fukuda retractor.   Remaining portion of the capsule was released at the base of the   coracoid.  The remaining biceps anchor and the entire anterior-inferior   labrum was excised.  The posterior labrum was also excised but the   posterior capsule was not released.  The glenoid was noted to be a B2 glenoid consistent with preoperative CT scan.  The anterior glenoid referencing guide was used from the perform plus set to place the guidepin. The anterior glenoid was reamed to the halfway point on the anterior half. The angled reamer was then used starting at 15 and then 25 to ream the posterior half. The trial was used to verify adequate reaming and then the peripheral holes were drilled with none of them exiting the glenoid wall. The central peg was then drilled. The trial was placed and felt to be a perfect fit..  I then pulse irrigated these holes and dried   them with Surgicel.  The three peripheral holes were then   pressurized cemented and the anchor peg perform +25 large glenoid was placed and impacted   with an excellent fit.  The proximal humerus was then again exposed taking care not to displace the glenoid.    The entry awl was used followed by sounding reamers and then sequentially broached from size 2 to 4. This was then left in place and the calcar planer was used. Trial head was placed  with a 52 x 23.  With the trial implantation of the component,  there was approximately 50% posterior translation with immediate snap back to the   anatomic position.  With forward elevation, there was no tendency   towards posterior subluxation.   The trial was removed and the final implant was prepared on a back table.  The trial was removed and the final implant was prepared on a back table.   3 small holes were drilled on the medial side of the lesser tuberosity osteotomy, through which 2 labral tapes were passed. The implant was then placed through the loop of the 2 labral tapes and impacted with an excellent press-fit. This achieved excellent anatomic reconstruction of the proximal humerus.  The joint was then  copiously irrigated with pulse lavage.  The subscapularis and   lesser tuberosity osteotomy were then repaired using the 2 labral tapes previously passed in a double row fashion with horizontal mattress sutures medially brought over through bone tunnels tied over a bone bridge laterally.   One #1 Ethibond was placed at the rotator interval just above   the lesser tuberosity. Copious irrigation was used. Skin was closed with 2-0 Vicryl sutures in the deep dermal layer and 4-0 Monocryl in a subcuticular  running fashion.  Sterile dressings were then applied including Aquacel.  The patient was placed in a sling and allowed to awaken from general anesthesia and taken to the recovery room in stable condition.      POSTOPERATIVE PLAN:  Early passive range of motion will be allowed with the goal of 0 degrees external rotation and 90 degrees forward elevation.  No internal rotation at this time.  No active motion of the arm until the lesser tuberosity heals.  The patient will likely be kept in the hospital for 1-2 days and then discharged home.

## 2016-06-15 NOTE — Anesthesia Procedure Notes (Signed)
Anesthesia Regional Block: Interscalene brachial plexus block   Pre-Anesthetic Checklist: ,, timeout performed, Correct Patient, Correct Site, Correct Laterality, Correct Procedure, Correct Position, site marked, Risks and benefits discussed,  Surgical consent,  Pre-op evaluation,  At surgeon's request and post-op pain management  Laterality: Left  Prep: chloraprep       Needles:  Injection technique: Single-shot  Needle Type: Echogenic Needle     Needle Length: 9cm  Needle Gauge: 21     Additional Needles:   Procedures: ultrasound guided,,,,,,,,  Narrative:  Start time: 06/15/2016 9:45 AM End time: 06/15/2016 9:50 AM Injection made incrementally with aspirations every 5 mL.  Performed by: Personally  Anesthesiologist: Suella Broad D  Additional Notes: Tolerated well.

## 2016-06-15 NOTE — Transfer of Care (Signed)
Immediate Anesthesia Transfer of Care Note  Patient: Patrick Gray  Procedure(s) Performed: Procedure(s) with comments: TOTAL SHOULDER ARTHROPLASTY (Left) - Left total shoulder arthroplasty  Patient Location: PACU  Anesthesia Type:GA combined with regional for post-op pain  Level of Consciousness: awake, alert  and oriented  Airway & Oxygen Therapy: Patient Spontanous Breathing and Patient connected to nasal cannula oxygen  Post-op Assessment: Report given to RN, Post -op Vital signs reviewed and stable and Patient moving all extremities  Post vital signs: Reviewed and stable  Last Vitals:  Vitals:   06/15/16 0844 06/15/16 1230  BP: (!) 158/110 (!) 142/91  Pulse: (!) 55 69  Resp: 18   Temp: 36.8 C 36.9 C    Last Pain:  Vitals:   06/15/16 1230  PainSc: (P) 0-No pain      Patients Stated Pain Goal: 3 (00/37/94 4461)  Complications: No apparent anesthesia complications

## 2016-06-15 NOTE — H&P (Signed)
Patrick Gray is an 61 y.o. male.   Chief Complaint: Left shoulder pain and dysfunction HPI: Endstage LEFT shoulder arthritis with significant pain and dysfunction, failed conservative measures.  Pain interferes with sleep and quality of life.   Past Medical History:  Diagnosis Date  . Anxiety   . Deafness in left ear   . Dysrhythmia   . H/O cardiac radiofrequency ablation   . HTN (hypertension)   . Hypercholesterolemia   . Hypopotassemia   . Paroxysmal atrial fibrillation (HCC)   . Tremor     Past Surgical History:  Procedure Laterality Date  . ACOUSTIC NEUROMA RESECTION Left 19990  . APPENDECTOMY  1976  . CARDIAC CATHETERIZATION N/A 10/07/2015   Procedure: Left Heart Cath and Coronary Angiography;  Surgeon: Josue Hector, MD;  Location: [facility];  Service: Cardiovascular;  Laterality: N/A;  . CARDIOVERSION N/A 06/17/2013   Procedure: CARDIOVERSION;  Surgeon: Josue Hector, MD;  Location: [facility];  Service: Cardiovascular;  Laterality: N/A;  . CARDIOVERSION N/A 05/11/2016   Procedure: CARDIOVERSION;  Surgeon: Sanda Klein, MD;  Location: [facility];  Service: Cardiovascular;  Laterality: N/A;  . CHOLECYSTECTOMY  1976  . CLUB FOOT RELEASE  1968  . ELECTROPHYSIOLOGIC STUDY N/A 11/30/2015   Procedure: Atrial Fibrillation Ablation;  Surgeon: Thompson Grayer, MD;  Location: [facility];  Service: Cardiovascular;  Laterality: N/A;  . TEE WITHOUT CARDIOVERSION N/A 05/11/2016   Procedure: TRANSESOPHAGEAL ECHOCARDIOGRAM (TEE);  Surgeon: Sanda Klein, MD;  Location: [facility];  Service: Cardiovascular;  Laterality: N/A;  . TONSILLECTOMY  1967    Family History  Problem Relation Age of Onset  . Hypertension Father   . Hypertension Mother   . Stroke Mother   . CVA Mother   . Hypertension Brother   . Hypercholesterolemia Brother   . Atrial fibrillation Brother   . Congestive Heart Failure Brother    Social History:  reports that he has quit smoking. He  has never used smokeless tobacco. He reports that he drinks alcohol. He reports that he does not use drugs.  Allergies:  Allergies  Allergen Reactions  . Penicillins Rash     Has patient had a PCN reaction causing immediate rash, facial/tongue/throat swelling, SOB or lightheadedness with hypotension: No Has patient had a PCN reaction causing severe rash involving mucus membranes or skin necrosis: No Has patient had a PCN reaction that required hospitalization No Has patient had a PCN reaction occurring within the last 10 years: No If all of the above answers are "NO", then may proceed with Cephalosporin use.     Medications Prior to Admission  Medication Sig Dispense Refill  . amLODipine (NORVASC) 5 MG tablet Take 5 mg by mouth daily.     Marland Kitchen apixaban (ELIQUIS) 5 MG TABS tablet Take 1 tablet (5 mg total) by mouth 2 (two) times daily. 60 tablet 11  . atorvastatin (LIPITOR) 20 MG tablet Take 20 mg by mouth at bedtime.     . Cholecalciferol (VITAMIN D3 PO) Take 1 capsule by mouth daily.    . Coenzyme Q10 (COQ10 PO) Take 1 capsule by mouth daily.    . flecainide (TAMBOCOR) 100 MG tablet TAKE ONE TABLET BY MOUTH TWICE DAILY 60 tablet 3  . KLOR-CON M20 20 MEQ tablet Take 40 mEq by mouth at bedtime.     Marland Kitchen lisinopril-hydrochlorothiazide (PRINZIDE,ZESTORETIC) 20-25 MG per tablet Take 1 tablet by mouth daily.     . Magnesium Oxide (MAG-OX PO) Take 1 tablet by  mouth daily.     . Menaquinone-7 (VITAMIN K2) 100 MCG CAPS Take 100 mcg by mouth daily.    . metoprolol (LOPRESSOR) 50 MG tablet Take 50 mg by mouth 2 (two) times daily.     . naproxen sodium (ANAPROX) 220 MG tablet Take 220-440 mg by mouth 2 (two) times daily as needed (pain).      No results found for this or any previous visit (from the past 48 hour(s)). No results found.  Review of Systems  All other systems reviewed and are negative.   Blood pressure (!) 158/110, pulse (!) 55, temperature 98.2 F (36.8 C), resp. rate 18, SpO2 98  %. Physical Exam  Constitutional: He is oriented to person, place, and time. He appears well-developed and well-nourished.  HENT:  Head: Atraumatic.  Eyes: EOM are normal.  Cardiovascular: Intact distal pulses.   Respiratory: Effort normal.  Musculoskeletal:  L shoulder pain with limited ROM.  Neurological: He is alert and oriented to person, place, and time.  Skin: Skin is warm and dry.  Psychiatric: He has a normal mood and affect.     Assessment/Plan Endstage left shoulder arthritis with significant pain and dysfunction, failed conservative measures.  Pain interferes with sleep and quality of life. Risks / benefits of surgery discussed Consent on chart  NPO for OR Preop antibiotics Will use TXA   Nita Sells, MD 06/15/2016, 9:40 AM

## 2016-06-16 ENCOUNTER — Encounter (HOSPITAL_COMMUNITY): Payer: Self-pay | Admitting: Orthopedic Surgery

## 2016-06-16 LAB — BASIC METABOLIC PANEL
Anion gap: 7 (ref 5–15)
BUN: 11 mg/dL (ref 6–20)
CO2: 29 mmol/L (ref 22–32)
Calcium: 9 mg/dL (ref 8.9–10.3)
Chloride: 102 mmol/L (ref 101–111)
Creatinine, Ser: 0.86 mg/dL (ref 0.61–1.24)
GFR calc Af Amer: >60 mL/min (ref >60)
GFR calc non Af Amer: >60 mL/min (ref >60)
Glucose, Bld: 149 mg/dL — ABNORMAL HIGH (ref 65–99)
Potassium: 3.4 mmol/L — ABNORMAL LOW (ref 3.5–5.1)
Sodium: 138 mmol/L (ref 135–145)

## 2016-06-16 LAB — CBC
HCT: 37.6 % — ABNORMAL LOW (ref 39.0–52.0)
Hemoglobin: 13.5 g/dL (ref 13.0–17.0)
MCH: 30.5 pg (ref 26.0–34.0)
MCHC: 35.9 g/dL (ref 30.0–36.0)
MCV: 84.9 fL (ref 78.0–100.0)
Platelets: 116 10*3/uL — ABNORMAL LOW (ref 150–400)
RBC: 4.43 MIL/uL (ref 4.22–5.81)
RDW: 12.6 % (ref 11.5–15.5)
WBC: 9.4 10*3/uL (ref 4.0–10.5)

## 2016-06-16 NOTE — Progress Notes (Signed)
Occupational Therapy Evaluation Patient Details Name: Patrick Gray MRN: [id number] DOB: 1956-01-23 Today's Date: 06/16/2016    History of Present Illness s/p L TSA. Significant PMH -see chart. Pt with balance distorder PTA.    Clinical Impression   All education completed regarding compensatory techniques for ADL, management of LUE and HEP per passive protocol. Written information iven and reviewed with pt. Pt safe to DC home with 24/7S. All further therapy to be addressed after follow up with MD.     Follow Up Recommendations  Other (comment);Supervision/Assistance - 24 hour (progress rehab L shoulder as directed by MD)    Equipment Recommendations  Tub/shower seat    Recommendations for Other Services       Precautions / Restrictions Precautions Precautions: Shoulder Type of Shoulder Precautions: passive protocol. FF 00-90; ER to neutral; elbow/wrist hand AROM  Shoulder Interventions: Shoulder sling/immobilizer;Off for dressing/bathing/exercises Precaution Booklet Issued: Yes (comment) Restrictions Weight Bearing Restrictions: No LUE Weight Bearing: Non weight bearing      Mobility Bed Mobility Overal bed mobility: Modified Independent                Transfers Overall transfer level: Needs assistance   Transfers: Sit to/from Stand Sit to Stand: Min guard         General transfer comment: Pt plans to use cane    Balance Overall balance assessment: Needs assistance           Standing balance-Leahy Scale: Fair Standing balance comment: baseline balance deficits per pt                           ADL either performed or assessed with clinical judgement   ADL Overall ADL's : Needs assistance/impaired                                     Functional mobility during ADLs: Min guard General ADL Comments: Completed education regarding compensatory techqiues for ADL. Pt/caregiver able to return demonstrate. Educated on proper  positioning of LUE in sitting/supine and sling management. Educated on reducing risk of falls as pt is high fall risk at this time. Recommended for pt to use showerseat due to balance defictis adn risk of losing balance in shower. Recommended for pt to wear sling at all times with the exception of ADL as pt reqruied mod vc to not use LUE during DL     Vision Baseline Vision/History: Wears glasses       Perception     Praxis      Pertinent Vitals/Pain Pain Assessment: No/denies pain Pain Location: L shoulder Pain Intervention(s): Ice applied;Repositioned     Hand Dominance Left   Extremity/Trunk Assessment Upper Extremity Assessment Upper Extremity Assessment: LUE deficits/detail LUE Deficits / Details: L shoulder FF to 90; ER to neutral; elbow/wrist/hand WFL LUE Coordination: decreased gross motor   Lower Extremity Assessment Lower Extremity Assessment: Overall WFL for tasks assessed   Cervical / Trunk Assessment Cervical / Trunk Assessment: Normal   Communication Communication Communication: HOH (deaf L ear)   Cognition Arousal/Alertness: Lethargic;Suspect due to medications Behavior During Therapy: [facility] for tasks assessed/performed Overall Cognitive Status: Within Functional Limits for tasks assessed                                     General  Comments       Exercises Exercises: Shoulder Shoulder Exercises Shoulder Flexion: PROM;Left;10 reps;Supine Shoulder External Rotation: PROM;Left;Supine;10 reps Elbow Flexion: AROM;Left;10 reps;Seated Elbow Extension: AROM;Left;10 reps;Seated Wrist Flexion: AROM;Left;10 reps;Seated Wrist Extension: AROM;Left;10 reps;Seated Digit Composite Flexion: AROM;Left;10 reps;Seated Composite Extension: AROM;10 reps;Left;Seated Donning/doffing shirt without moving shoulder: Caregiver independent with task Method for sponge bathing under operated UE: Caregiver independent with task Donning/doffing sling/immobilizer:  Caregiver independent with task Correct positioning of sling/immobilizer: Caregiver independent with task ROM for elbow, wrist and digits of operated UE: Supervision/safety Sling wearing schedule (on at all times/off for ADL's): Supervision/safety Proper positioning of operated UE when showering: Supervision/safety Positioning of UE while sleeping: Caregiver independent with task   Shoulder Instructions Shoulder Instructions Donning/doffing shirt without moving shoulder: Caregiver independent with task Method for sponge bathing under operated UE: Caregiver independent with task Donning/doffing sling/immobilizer: Caregiver independent with task Correct positioning of sling/immobilizer: Caregiver independent with task ROM for elbow, wrist and digits of operated UE: Supervision/safety Sling wearing schedule (on at all times/off for ADL's): Supervision/safety Proper positioning of operated UE when showering: Supervision/safety Positioning of UE while sleeping: Caregiver independent with task    Home Living Family/patient expects to be discharged to:: Private residence Living Arrangements: Spouse/significant other Available Help at Discharge: Family;Available 24 hours/day               Bathroom Shower/Tub: Occupational psychologist: Standard                Prior Functioning/Environment Level of Independence: Independent        Comments: history of balance disorder         OT Problem List: Decreased strength;Decreased range of motion;Impaired balance (sitting and/or standing);Decreased safety awareness;Decreased knowledge of precautions;Impaired UE functional use;Pain      OT Treatment/Interventions:      OT Goals(Current goals can be found in the care plan section) Acute Rehab OT Goals Patient Stated Goal: to use my L arm OT Goal Formulation: All assessment and education complete, DC therapy  OT Frequency:     Barriers to D/C:            Co-evaluation               End of Session Nurse Communication: Other (comment) (ready for DC)  Activity Tolerance: Patient tolerated treatment well Patient left: in chair;with call bell/phone within reach;with family/visitor present  OT Visit Diagnosis: Unsteadiness on feet (R26.81);Pain;Muscle weakness (generalized) (M62.81) Pain - Right/Left: Left Pain - part of body: Shoulder                Time: 2992-4268 OT Time Calculation (min): 45 min Charges:  OT General Charges $OT Visit: 1 Procedure OT Evaluation $OT Eval Moderate Complexity: 1 Procedure OT Treatments $Self Care/Home Management : 8-22 mins $Therapeutic Exercise: 8-22 mins G-Codes:     [facility], OT/L  [phone] 06/16/2016  Dearius Hoffmann,HILLARY 06/16/2016, 10:51 AM

## 2016-06-16 NOTE — Progress Notes (Signed)
Patient for discharge accompanied by his wife. Medications and discharge instructions explained to the patient and his wife. They verbalized understanding. Copies given to his wife including original prescriptions. IV saline lock and tele pack were removed. CCMD notified.

## 2016-06-16 NOTE — Progress Notes (Signed)
Patient tolerating PO well, IVF stopped per MD order.

## 2016-06-16 NOTE — Progress Notes (Signed)
   PATIENT ID: Beckie Salts   1 Day Post-Op Procedure(s) (LRB): TOTAL SHOULDER ARTHROPLASTY (Left)  Subjective: Doing well, block wearing off.Pain well controlled with percocet.No other complaints or concerns.   Objective:  Vitals:   06/15/16 2034 06/16/16 0531  BP: 124/74 120/75  Pulse: 92 84  Resp: 16 16  Temp: 97.9 F (36.6 C) 97.9 F (36.6 C)     L shoulder dressing c/d/I Wiggles fingers, mod hand swelling Subjective residual numbness in thumb, otherwise NVI  Labs:  No results for input(s): HGB in the last 72 hours.No results for input(s): WBC, RBC, HCT, PLT in the last 72 hours.No results for input(s): NA, K, CL, CO2, BUN, CREATININE, GLUCOSE, CALCIUM in the last 72 hours.  Assessment and Plan: 1 day s/p L TSA Continue po percocet for pain OT- PROM goal to 90FF and 0 ER D/c home when cleared by OT Follow up with Dr. Tamera Punt in 2 weeks  VTE proph: Eliquis, SCDs

## 2016-06-16 NOTE — Discharge Summary (Signed)
Patient ID: Patrick Gray MRN: [id number] DOB/AGE: 08/20/1955 61 y.o.  Admit date: 06/15/2016 Discharge date: 06/16/2016  Admission Diagnoses:  Active Problems:   S/P shoulder replacement, left   Discharge Diagnoses:  Same  Past Medical History:  Diagnosis Date  . Anxiety   . Deafness in left ear   . Dysrhythmia   . H/O cardiac radiofrequency ablation   . HTN (hypertension)   . Hypercholesterolemia   . Hypopotassemia   . Paroxysmal atrial fibrillation (HCC)   . Tremor     Surgeries: Procedure(s): TOTAL SHOULDER ARTHROPLASTY on 06/15/2016   Consultants:   Discharged Condition: Improved  Hospital Course: Patrick Gray is an 61 y.o. male who was admitted 06/15/2016 for operative treatment of end stage left shoulder OA. Patient has severe unremitting pain that affects sleep, daily activities, and work/hobbies. After pre-op clearance the patient was taken to the operating room on 06/15/2016 and underwent  Procedure(s): TOTAL SHOULDER ARTHROPLASTY.    Patient was given perioperative antibiotics: Anti-infectives    Start     Dose/Rate Route Frequency Ordered Stop   06/15/16 1600  ceFAZolin (ANCEF) IVPB 2g/100 mL premix     2 g 200 mL/hr over 30 Minutes Intravenous Every 6 hours 06/15/16 1452 06/16/16 0343   06/15/16 0836  vancomycin (VANCOCIN) IVPB 1000 mg/200 mL premix     1,000 mg 200 mL/hr over 60 Minutes Intravenous On call to O.R. 06/15/16 8756 06/15/16 1041       Patient was given sequential compression devices, early ambulation, and chemoprophylaxis to prevent DVT.  Patient benefited maximally from hospital stay and there were no complications.    Recent vital signs: Patient Vitals for the past 24 hrs:  BP Temp Temp src Pulse Resp SpO2  06/16/16 0531 120/75 97.9 F (36.6 C) Oral 84 16 96 %  06/15/16 2034 124/74 97.9 F (36.6 C) Oral 92 16 96 %  06/15/16 1451 129/79 98 F (36.7 C) Oral 80 - 94 %  06/15/16 1430 133/87 97.9 F (36.6 C) - 77 13 95 %  06/15/16 1400  124/85 - - 73 13 94 %  06/15/16 1345 127/87 - - 73 13 94 %  06/15/16 1330 131/86 - - 70 16 94 %  06/15/16 1315 128/86 - - 70 14 96 %  06/15/16 1300 131/88 - - 68 17 99 %  06/15/16 1245 (!) 131/96 - - 68 12 97 %  06/15/16 1230 (!) 142/91 98.5 F (36.9 C) - 69 - 96 %  06/15/16 0844 (!) 158/110 98.2 F (36.8 C) - (!) 55 18 98 %     Recent laboratory studies:  Recent Labs  06/16/16 0738  WBC 9.4  HGB 13.5  HCT 37.6*  PLT PENDING     Discharge Medications:   Allergies as of 06/16/2016      Reactions   Penicillins Rash   Has patient had a PCN reaction causing immediate rash, facial/tongue/throat swelling, SOB or lightheadedness with hypotension: No Has patient had a PCN reaction causing severe rash involving mucus membranes or skin necrosis: No Has patient had a PCN reaction that required hospitalization No Has patient had a PCN reaction occurring within the last 10 years: No If all of the above answers are "NO", then may proceed with Cephalosporin use.      Medication List    TAKE these medications   amLODipine 5 MG tablet Commonly known as:  NORVASC Take 5 mg by mouth daily.   apixaban 5 MG Tabs tablet Commonly known  asArne Cleveland Take 1 tablet (5 mg total) by mouth 2 (two) times daily.   atorvastatin 20 MG tablet Commonly known as:  LIPITOR Take 20 mg by mouth at bedtime.   COQ10 PO Take 1 capsule by mouth daily.   docusate sodium 100 MG capsule Commonly known as:  COLACE Take 1 capsule (100 mg total) by mouth 3 (three) times daily as needed.   flecainide 100 MG tablet Commonly known as:  TAMBOCOR TAKE ONE TABLET BY MOUTH TWICE DAILY   KLOR-CON M20 20 MEQ tablet Generic drug:  potassium chloride SA Take 40 mEq by mouth at bedtime.   lisinopril-hydrochlorothiazide 20-25 MG tablet Commonly known as:  PRINZIDE,ZESTORETIC Take 1 tablet by mouth daily.   MAG-OX PO Take 1 tablet by mouth daily.   metoprolol 50 MG tablet Commonly known as:  LOPRESSOR Take  50 mg by mouth 2 (two) times daily.   naproxen sodium 220 MG tablet Commonly known as:  ANAPROX Take 220-440 mg by mouth 2 (two) times daily as needed (pain).   oxyCODONE-acetaminophen 5-325 MG tablet Commonly known as:  ROXICET Take 1-2 tablets by mouth every 4 (four) hours as needed for severe pain.   VITAMIN D3 PO Take 1 capsule by mouth daily.   Vitamin K2 100 MCG Caps Take 100 mcg by mouth daily.       Diagnostic Studies: Dg Chest 2 View  Result Date: 06/08/2016 CLINICAL DATA:  Shoulder replacement . EXAM: CHEST  2 VIEW COMPARISON:  CT 04/24/2016.  CT 11/24/2015. FINDINGS: Mediastinum hilar structures are normal. Lungs are clear. Heart size normal. No pleural effusion or pneumothorax. Degenerative changes thoracic spine. Pectus deformity noted. IMPRESSION: No acute abnormality identified. Electronically Signed   By: Marcello Moores  Register   On: 06/08/2016 13:27   Dg Shoulder Left Port  Result Date: 06/15/2016 CLINICAL DATA:  Left shoulder arthroplasty. EXAM: LEFT SHOULDER - 1 VIEW COMPARISON:  CT 04/24/2016. FINDINGS: Left shoulder replacement. Hardware intact. No acute bony abnormality identified. Low lung volumes with basilar atelectasis. IMPRESSION: 1. Left shoulder replacement.  Hardware intact.  Anatomic alignment. 2. Low lung volumes basilar atelectasis. Electronically Signed   By: Marcello Moores  Register   On: 06/15/2016 13:07    Disposition: 01-Home or Self Care  Discharge Instructions    Call MD / Call 911    Complete by:  As directed    If you experience chest pain or shortness of breath, CALL 911 and be transported to the hospital emergency room.  If you develope a fever above 101 F, pus (Buccellato drainage) or increased drainage or redness at the wound, or calf pain, call your surgeon's office.   Constipation Prevention    Complete by:  As directed    Drink plenty of fluids.  Prune juice may be helpful.  You may use a stool softener, such as Colace (over the counter) 100 mg twice  a day.  Use MiraLax (over the counter) for constipation as needed.   Diet - low sodium heart healthy    Complete by:  As directed    Increase activity slowly as tolerated    Complete by:  As directed       Follow-up Information    Nita Sells, MD. Schedule an appointment as soon as possible for a visit in 2 weeks.   Specialty:  Orthopedic Surgery Contact information: [address] [place]  [address] [phone]            Signed: Grier Mitts 06/16/2016, 8:32 AM

## 2016-06-19 ENCOUNTER — Ambulatory Visit: Payer: BC Managed Care – PPO | Admitting: Internal Medicine

## 2016-07-21 NOTE — Addendum Note (Signed)
Addendum  created 07/21/16 1331 by [name], MD   Sign clinical note

## 2016-08-21 ENCOUNTER — Encounter: Payer: Self-pay | Admitting: Internal Medicine

## 2016-08-21 ENCOUNTER — Ambulatory Visit (INDEPENDENT_AMBULATORY_CARE_PROVIDER_SITE_OTHER): Payer: BC Managed Care – PPO | Admitting: Internal Medicine

## 2016-08-21 VITALS — BP 138/94 | HR 59 | Ht 71.0 in | Wt 213.6 lb

## 2016-08-21 DIAGNOSIS — I10 Essential (primary) hypertension: Secondary | ICD-10-CM

## 2016-08-21 DIAGNOSIS — I481 Persistent atrial fibrillation: Secondary | ICD-10-CM

## 2016-08-21 DIAGNOSIS — I480 Paroxysmal atrial fibrillation: Secondary | ICD-10-CM

## 2016-08-21 DIAGNOSIS — I4819 Other persistent atrial fibrillation: Secondary | ICD-10-CM

## 2016-08-21 NOTE — Patient Instructions (Signed)
Medication Instructions:    Your physician recommends that you continue on your current medications as directed. Please refer to the Current Medication list given to you today.  - If you need a refill on your cardiac medications before your next appointment, please call your pharmacy.   Labwork:  None ordered  Testing/Procedures: Your physician has requested that you have an echocardiogram in 6 months, before your office visit with Dr. Rayann Heman. Echocardiography is a painless test that uses sound waves to create images of your heart. It provides your doctor with information about the size and shape of your heart and how well your heart's chambers and valves are working. This procedure takes approximately one hour. There are no restrictions for this procedure  Follow-Up:  Your physician recommends that you schedule a follow-up appointment in: 3 months with Roderic Palau, NP in the AFib clinic   Your physician wants you to follow-up in: 6 months with Dr. Rayann Heman (after echocardiogram).  You will receive a reminder letter in the mail two months in advance. If you don't receive a letter, please call our office to schedule the follow-up appointment.  Thank you for choosing [facility]!!    Any Other Special Instructions Will Be Listed Below (If Applicable).

## 2016-08-21 NOTE — Progress Notes (Signed)
PCP: [name], MD Primary Cardiologist: Dr Kathrin Ruddy is a 61 y.o. male who presents today for routine electrophysiology followup.  Since last being seen in our clinic, the patient reports doing reasonably well.  He did have an episode of afib in March for which he was cardioverted.  His flecainide was increased to 100mg  BID and he has had no further sustained afib.  He has a minor L arm tremor which he thinks might be worse with flecainide (though primarily due to arm weakness from recent shoulder surgery).  Today, he denies symptoms of palpitations, chest pain, shortness of breath,  lower extremity edema, dizziness, presyncope, or syncope.  The patient is otherwise without complaint today.   Past Medical History:  Diagnosis Date  . Anxiety   . Deafness in left ear   . Dysrhythmia   . H/O cardiac radiofrequency ablation   . HTN (hypertension)   . Hypercholesterolemia   . Hypopotassemia   . Paroxysmal atrial fibrillation (HCC)   . Tremor    Past Surgical History:  Procedure Laterality Date  . ACOUSTIC NEUROMA RESECTION Left 19990  . APPENDECTOMY  1976  . CARDIAC CATHETERIZATION N/A 10/07/2015   Procedure: Left Heart Cath and Coronary Angiography;  Surgeon: Josue Hector, MD;  Location: [place] [facility];  Service: Cardiovascular;  Laterality: N/A;  . CARDIOVERSION N/A 06/17/2013   Procedure: CARDIOVERSION;  Surgeon: Josue Hector, MD;  Location: [place];  Service: Cardiovascular;  Laterality: N/A;  . CARDIOVERSION N/A 05/11/2016   Procedure: CARDIOVERSION;  Surgeon: Sanda Klein, MD;  Location: [place] [facility];  Service: Cardiovascular;  Laterality: N/A;  . CHOLECYSTECTOMY  1976  . CLUB FOOT RELEASE  1968  . ELECTROPHYSIOLOGIC STUDY N/A 11/30/2015   Procedure: Atrial Fibrillation Ablation;  Surgeon: Thompson Grayer, MD;  Location: [place] [facility];  Service: Cardiovascular;  Laterality: N/A;  . TEE WITHOUT CARDIOVERSION N/A 05/11/2016   Procedure: TRANSESOPHAGEAL  ECHOCARDIOGRAM (TEE);  Surgeon: Sanda Klein, MD;  Location: [place];  Service: Cardiovascular;  Laterality: N/A;  . TONSILLECTOMY  1967  . TOTAL SHOULDER ARTHROPLASTY Left 06/15/2016   Procedure: TOTAL SHOULDER ARTHROPLASTY;  Surgeon: Tania Ade, MD;  Location: [place];  Service: Orthopedics;  Laterality: Left;  Left total shoulder arthroplasty    ROS- all systems are reviewed and negatives except as per HPI above  Current Outpatient Prescriptions  Medication Sig Dispense Refill  . amLODipine (NORVASC) 5 MG tablet Take 5 mg by mouth daily.     Marland Kitchen apixaban (ELIQUIS) 5 MG TABS tablet Take 1 tablet (5 mg total) by mouth 2 (two) times daily. 60 tablet 11  . atorvastatin (LIPITOR) 20 MG tablet Take 20 mg by mouth at bedtime.     . Cholecalciferol (VITAMIN D3 PO) Take 1 capsule by mouth daily.    . Coenzyme Q10 (COQ10 PO) Take 1 capsule by mouth daily.    Marland Kitchen docusate sodium (COLACE) 100 MG capsule Take 1 capsule (100 mg total) by mouth 3 (three) times daily as needed. 20 capsule 0  . flecainide (TAMBOCOR) 100 MG tablet TAKE ONE TABLET BY MOUTH TWICE DAILY 60 tablet 3  . KLOR-CON M20 20 MEQ tablet Take 40 mEq by mouth at bedtime.     Marland Kitchen lisinopril-hydrochlorothiazide (PRINZIDE,ZESTORETIC) 20-25 MG per tablet Take 1 tablet by mouth daily.     . Magnesium Oxide (MAG-OX PO) Take 1 tablet by mouth daily.     . metoprolol (LOPRESSOR) 50 MG tablet Take 50 mg by mouth  2 (two) times daily.     . naproxen sodium (ANAPROX) 220 MG tablet Take 220-440 mg by mouth 2 (two) times daily as needed (pain).     No current facility-administered medications for this visit.     Physical Exam: Vitals:   08/21/16 1415  BP: (!) 138/94  Pulse: (!) 59  SpO2: 96%  Weight: 213 lb 9.6 oz (96.9 kg)  Height: 5\' 11"  (1.803 m)    GEN- The patient is well appearing, alert and oriented x 3 today.   Head- normocephalic, atraumatic Eyes-  Sclera clear, conjunctiva pink Ears- hearing intact Oropharynx-  clear Lungs- Clear to ausculation bilaterally, normal work of breathing Heart- Regular rate and rhythm, no murmurs, rubs or gallops, PMI not laterally displaced GI- soft, NT, ND, + BS Extremities- no clubbing, cyanosis, or edema Mild L arm resting tremor  EKG tracing ordered today is personally reviewed and shows sinus rhythm 59 bpm, PR 226 msec, QRS 11msec< Qtc 449 msec, nonspecific ST/T changes  Assessment and Plan:  1. Persistent afib Doing well s/p cardioversion in March Continue on flecainide for now We did discuss repeat ablation.  He would prefer to continues medicines currently chads2vasc score is 1.  He wishes to continue eliquis  2. HTN Stable No change required today  3. Nonischemic CM Repeat echo upon return in 6 months to see me  Follow-up with Butch Penny in the AF clinic in 3 months I will see in 6 months Follow-up with Dr Johnsie Cancel as scheduled  Thompson Grayer MD, [facility] 08/21/2016 2:33 PM

## 2016-09-18 ENCOUNTER — Other Ambulatory Visit (HOSPITAL_COMMUNITY): Payer: Self-pay | Admitting: Nurse Practitioner

## 2016-10-18 ENCOUNTER — Other Ambulatory Visit: Payer: Self-pay | Admitting: Internal Medicine

## 2016-10-18 NOTE — Telephone Encounter (Signed)
Request received for Eliquis 5mg , pt is 61 yrs old, wt-96.9kg, Crea-0.86, last seen by Dr. Rayann Heman on 08/21/16, will send in refill request to requested pharmacy.

## 2016-10-23 ENCOUNTER — Encounter: Payer: Self-pay | Admitting: Internal Medicine

## 2016-10-24 MED ORDER — APIXABAN 5 MG PO TABS: 5.0000 mg | ORAL_TABLET | Freq: Two times a day (BID) | ORAL | 1 refills | Status: DC

## 2016-11-15 ENCOUNTER — Encounter (HOSPITAL_COMMUNITY): Payer: Self-pay | Admitting: Nurse Practitioner

## 2016-11-15 ENCOUNTER — Ambulatory Visit (HOSPITAL_COMMUNITY)
Admission: RE | Admit: 2016-11-15 | Discharge: 2016-11-15 | Disposition: A | Discharge status: Home / Self Care | Payer: BC Managed Care – PPO | Source: Ambulatory Visit | Attending: Nurse Practitioner | Admitting: Nurse Practitioner

## 2016-11-15 VITALS — BP 120/84 | HR 85 | Ht 71.0 in | Wt 219.8 lb

## 2016-11-15 DIAGNOSIS — I4892 Unspecified atrial flutter: Secondary | ICD-10-CM | POA: Diagnosis not present

## 2016-11-15 DIAGNOSIS — E7800 Pure hypercholesterolemia, unspecified: Secondary | ICD-10-CM | POA: Diagnosis not present

## 2016-11-15 DIAGNOSIS — Z823 Family history of stroke: Secondary | ICD-10-CM | POA: Diagnosis not present

## 2016-11-15 DIAGNOSIS — Z79899 Other long term (current) drug therapy: Secondary | ICD-10-CM | POA: Insufficient documentation

## 2016-11-15 DIAGNOSIS — I481 Persistent atrial fibrillation: Secondary | ICD-10-CM

## 2016-11-15 DIAGNOSIS — H9192 Unspecified hearing loss, left ear: Secondary | ICD-10-CM | POA: Diagnosis not present

## 2016-11-15 DIAGNOSIS — Z87891 Personal history of nicotine dependence: Secondary | ICD-10-CM | POA: Insufficient documentation

## 2016-11-15 DIAGNOSIS — Z9049 Acquired absence of other specified parts of digestive tract: Secondary | ICD-10-CM | POA: Insufficient documentation

## 2016-11-15 DIAGNOSIS — I480 Paroxysmal atrial fibrillation: Secondary | ICD-10-CM | POA: Insufficient documentation

## 2016-11-15 DIAGNOSIS — I4819 Other persistent atrial fibrillation: Secondary | ICD-10-CM

## 2016-11-15 DIAGNOSIS — I10 Essential (primary) hypertension: Secondary | ICD-10-CM | POA: Insufficient documentation

## 2016-11-15 DIAGNOSIS — Z9889 Other specified postprocedural states: Secondary | ICD-10-CM | POA: Insufficient documentation

## 2016-11-15 NOTE — Addendum Note (Signed)
Encounter addended by: Sherran Needs, NP on: 11/15/2016  2:21 PM<BR>    Actions taken: Visit diagnoses modified

## 2016-11-15 NOTE — Progress Notes (Signed)
Patient ID: Patrick Gray, male   DOB: 1955/09/22, 61 y.o.   MRN: [id number]     Primary Care Physician: [name], MD Referring Physician: Filip Luten is a 61 y.o. male with a h/o afib 2015 found by primary. Was placed on flecainide 100 mg bid and has done well until seen 6/20 and was noted to be in aflutter with RVR.Because of breakthrough afib, he was scheduled for ablation 11/30/15.  F/u in afib clinic 3 13. He was pending shoulder surgery   and has been in afib x 2 weeks. Flecainide was decreased to 50 mg bid, as he was told he could try to wean off after cardioversion. and within several days he returned to afib.   After shoulder surgery, he did have afib for a couple of weeks and did return to [place] on his own.  He is in the afib clinic 9/26. He has returned to afib since the beginning of September. He had been trying to wean of flecainide  again. He has has some tremors and when he tries to wean off drug, the tremors get better. He is toying with the idea of having  another ablation vrs a cardioversion but has guests this week into next week and will wait until then to make the decision. He is tolerating well and is rate controlled. No missed doses of eliquis.  Today, he denies symptoms of palpitations, chest pain, shortness of breath, orthopnea, PND, lower extremity edema, dizziness, presyncope, syncope, or neurologic sequela. Positive for fatigue in afib , hand tremor. The patient is tolerating medications without difficulties and is otherwise without complaint today.   Past Medical History:  Diagnosis Date  . Anxiety   . Deafness in left ear   . Dysrhythmia   . H/O cardiac radiofrequency ablation   . HTN (hypertension)   . Hypercholesterolemia   . Hypopotassemia   . Paroxysmal atrial fibrillation (HCC)   . Tremor    Past Surgical History:  Procedure Laterality Date  . ACOUSTIC NEUROMA RESECTION Left 19990  . APPENDECTOMY  1976  . CARDIAC CATHETERIZATION N/A  10/07/2015   Procedure: Left Heart Cath and Coronary Angiography;  Surgeon: Josue Hector, MD;  Location: [facility];  Service: Cardiovascular;  Laterality: N/A;  . CARDIOVERSION N/A 06/17/2013   Procedure: CARDIOVERSION;  Surgeon: Josue Hector, MD;  Location: [facility];  Service: Cardiovascular;  Laterality: N/A;  . CARDIOVERSION N/A 05/11/2016   Procedure: CARDIOVERSION;  Surgeon: Sanda Klein, MD;  Location: [facility];  Service: Cardiovascular;  Laterality: N/A;  . CHOLECYSTECTOMY  1976  . CLUB FOOT RELEASE  1968  . ELECTROPHYSIOLOGIC STUDY N/A 11/30/2015   Procedure: Atrial Fibrillation Ablation;  Surgeon: Thompson Grayer, MD;  Location: [facility];  Service: Cardiovascular;  Laterality: N/A;  . TEE WITHOUT CARDIOVERSION N/A 05/11/2016   Procedure: TRANSESOPHAGEAL ECHOCARDIOGRAM (TEE);  Surgeon: Sanda Klein, MD;  Location: [facility];  Service: Cardiovascular;  Laterality: N/A;  . TONSILLECTOMY  1967  . TOTAL SHOULDER ARTHROPLASTY Left 06/15/2016   Procedure: TOTAL SHOULDER ARTHROPLASTY;  Surgeon: Tania Ade, MD;  Location: [facility];  Service: Orthopedics;  Laterality: Left;  Left total shoulder arthroplasty    Current Outpatient Prescriptions  Medication Sig Dispense Refill  . amLODipine (NORVASC) 5 MG tablet Take 5 mg by mouth daily.     Marland Kitchen apixaban (ELIQUIS) 5 MG TABS tablet Take 1 tablet (5 mg total) by mouth 2 (two) times daily. 180 tablet 1  .  atorvastatin (LIPITOR) 20 MG tablet Take 20 mg by mouth at bedtime.     . Cholecalciferol (VITAMIN D3 PO) Take 1 capsule by mouth daily.    . Coenzyme Q10 (COQ10 PO) Take 1 capsule by mouth daily.    Marland Kitchen docusate sodium (COLACE) 100 MG capsule Take 1 capsule (100 mg total) by mouth 3 (three) times daily as needed. 20 capsule 0  . flecainide (TAMBOCOR) 100 MG tablet TAKE 1 TABLET BY MOUTH TWICE DAILY 60 tablet 3  . KLOR-CON M20 20 MEQ tablet Take 40 mEq by mouth at bedtime.     Marland Kitchen lisinopril-hydrochlorothiazide  (PRINZIDE,ZESTORETIC) 20-25 MG per tablet Take 1 tablet by mouth daily.     . Magnesium Oxide (MAG-OX PO) Take 1 tablet by mouth daily.     . metoprolol (LOPRESSOR) 50 MG tablet Take 50 mg by mouth 2 (two) times daily.     . naproxen sodium (ANAPROX) 220 MG tablet Take 220-440 mg by mouth 2 (two) times daily as needed (pain).     No current facility-administered medications for this encounter.     Allergies  Allergen Reactions  . Penicillins Rash     Has patient had a PCN reaction causing immediate rash, facial/tongue/throat swelling, SOB or lightheadedness with hypotension: No Has patient had a PCN reaction causing severe rash involving mucus membranes or skin necrosis: No Has patient had a PCN reaction that required hospitalization No Has patient had a PCN reaction occurring within the last 10 years: No If all of the above answers are "NO", then may proceed with Cephalosporin use.     Social History   Social History  . Marital status: Married    Spouse name: N/A  . Number of children: N/A  . Years of education: N/A   Occupational History  . Not on file.   Social History Main Topics  . Smoking status: Former Research scientist (life sciences)  . Smokeless tobacco: Never Used  . Alcohol use Yes     Comment: previously 20 beers per week, now 2 beers per week  . Drug use: No  . Sexual activity: Not on file   Other Topics Concern  . Not on file   Social History Narrative   Lives in [place] wife.  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    Family History  Problem Relation Age of Onset  . Hypertension Father   . Hypertension Mother   . Stroke Mother   . CVA Mother   . Hypertension Brother   . Hypercholesterolemia Brother   . Atrial fibrillation Brother   . Congestive Heart Failure Brother     ROS- All systems are reviewed and negative except as per the HPI above  Physical Exam: Vitals:   11/15/16 1054  BP: 120/84  Pulse: 85  Weight: 219 lb 12.8 oz (99.7 kg)  Height: 5\' 11"  (1.803  m)    GEN- The patient is well appearing, alert and oriented x 3 today.   Head- normocephalic, atraumatic Eyes-  Sclera clear, conjunctiva pink Ears- hearing intact Oropharynx- clear Neck- supple, no JVP Lymph- no cervical lymphadenopathy Lungs- Clear to ausculation bilaterally, normal work of breathing Heart -irregular rate and rhythm, no murmurs, rubs or gallops, PMI not laterally displaced GI- soft, NT, ND, + BS Extremities- no clubbing, cyanosis, or edema MS- no significant deformity or atrophy Skin- no rash or lesion Psych- euthymic mood, full affect Neuro- strength and sensation are intact  EKG- afluttter at 85 bpm, qrs int 116 ms, qtc 585 ms Epic records  reviewed Echo 6-17-.- Left ventricle: The cavity size was moderately dilated. Wall  thickness was increased in a pattern of mild LVH. Systolic  function was mildly reduced. The estimated ejection fraction was  in the range of 45% to 50%. Diffuse hypokinesis. - Aortic valve: There was trivial regurgitation. - Left atrium: The atrium was moderately dilated. 47 mm  Impressions:  - Mild global reduction in LV function; moderate LVE; mild LVH;   trace AI; moderate LAE.  10/07/15-Cardiac catherization-Prox RCA lesion, 30 %stenosed.  RPDA lesion, 30 %stenosed.   Normal right dominant coronary arteries with false positive myovue EF 65% Only 30% proximal/mid and PLA lesions seen  Assessment and Plan: 1.  Paroxymal atrial fibrillation s/p afib ablation 11/30/15 Successful cardioversion 05/11/16 He has been out of rhythm since the first of September He is trying to decide between another cardioversion/flecainde vrs repeat ablation At this point he thinks he is leaning toward repeat ablation He would like to stop flecainide for worsening tremors He will let us know his decision in the next week  IF so, he asks if we could schedule this in conjunction with Dr. Rayann Heman instead of returning to his office and I think this  can be done. Continue apixaban for chadsvasc score of 1.   Afib clinic as needed, pt will let me know his decision soon    Geroge Baseman. Ziare Cryder, [facility] [address] [place], [place] [address] [phone]

## 2016-11-20 ENCOUNTER — Telehealth (HOSPITAL_COMMUNITY): Payer: Self-pay | Admitting: *Deleted

## 2016-11-20 NOTE — Telephone Encounter (Signed)
Pt called in stating he would like to proceed with repeat ablation. Instructed pt I would speak with Dr. Rayann Heman and be back in touch with patient.

## 2016-11-21 ENCOUNTER — Other Ambulatory Visit (HOSPITAL_COMMUNITY): Payer: Self-pay | Admitting: *Deleted

## 2016-11-21 DIAGNOSIS — I4819 Other persistent atrial fibrillation: Secondary | ICD-10-CM

## 2016-11-21 NOTE — Telephone Encounter (Signed)
afib ablation scheduled for October 30th with Dr. Rayann Heman. Pt to have cardiac CT prior to ablation -- scheduling requested. Instructions reviewed with pt. No missed doses of Eliquis.

## 2016-11-27 ENCOUNTER — Ambulatory Visit (HOSPITAL_COMMUNITY): Payer: BC Managed Care – PPO | Admitting: Nurse Practitioner

## 2016-11-28 ENCOUNTER — Telehealth: Payer: Self-pay | Admitting: *Deleted

## 2016-11-28 ENCOUNTER — Encounter: Payer: Self-pay | Admitting: Internal Medicine

## 2016-11-28 DIAGNOSIS — I4891 Unspecified atrial fibrillation: Secondary | ICD-10-CM

## 2016-11-28 NOTE — Telephone Encounter (Signed)
Correct order placed for this pts cardiac ct pre-afib ablation.  This was corrected to help assist Nettie Elm in scheduling, to arrange this for the pt.

## 2016-12-05 ENCOUNTER — Other Ambulatory Visit: Payer: BC Managed Care – PPO | Admitting: *Deleted

## 2016-12-05 DIAGNOSIS — I4819 Other persistent atrial fibrillation: Secondary | ICD-10-CM

## 2016-12-05 DIAGNOSIS — I10 Essential (primary) hypertension: Secondary | ICD-10-CM

## 2016-12-05 DIAGNOSIS — I480 Paroxysmal atrial fibrillation: Secondary | ICD-10-CM

## 2016-12-05 NOTE — Addendum Note (Signed)
Addended by: Eulis Foster on: 12/05/2016 01:43 PM   Modules accepted: Orders

## 2016-12-05 NOTE — Progress Notes (Signed)
cbc

## 2016-12-06 LAB — CBC WITH DIFFERENTIAL/PLATELET
Basophils Absolute: 0 10*3/uL (ref 0.0–0.2)
Basos: 0 %
EOS (ABSOLUTE): 0.2 10*3/uL (ref 0.0–0.4)
Eos: 4 %
Hematocrit: 43.9 % (ref 37.5–51.0)
Hemoglobin: 15.8 g/dL (ref 13.0–17.7)
Immature Grans (Abs): 0 10*3/uL (ref 0.0–0.1)
Immature Granulocytes: 0 %
Lymphocytes Absolute: 2.3 10*3/uL (ref 0.7–3.1)
Lymphs: 44 %
MCH: 31.4 pg (ref 26.6–33.0)
MCHC: 36 g/dL — ABNORMAL HIGH (ref 31.5–35.7)
MCV: 87 fL (ref 79–97)
Monocytes Absolute: 0.4 10*3/uL (ref 0.1–0.9)
Monocytes: 8 %
Neutrophils Absolute: 2.4 10*3/uL (ref 1.4–7.0)
Neutrophils: 44 %
Platelets: 149 10*3/uL — ABNORMAL LOW (ref 150–379)
RBC: 5.03 x10E6/uL (ref 4.14–5.80)
RDW: 14.4 % (ref 12.3–15.4)
WBC: 5.3 10*3/uL (ref 3.4–10.8)

## 2016-12-06 LAB — BASIC METABOLIC PANEL
BUN/Creatinine Ratio: 21 (ref 10–24)
BUN: 17 mg/dL (ref 8–27)
CO2: 25 mmol/L (ref 20–29)
Calcium: 9.7 mg/dL (ref 8.6–10.2)
Chloride: 102 mmol/L (ref 96–106)
Creatinine, Ser: 0.8 mg/dL (ref 0.76–1.27)
GFR calc Af Amer: 111 mL/min/{1.73_m2} (ref >59)
GFR calc non Af Amer: 96 mL/min/{1.73_m2} (ref >59)
Glucose: 109 mg/dL — ABNORMAL HIGH (ref 65–99)
Potassium: 3.3 mmol/L — ABNORMAL LOW (ref 3.5–5.2)
Sodium: 143 mmol/L (ref 134–144)

## 2016-12-14 ENCOUNTER — Ambulatory Visit (HOSPITAL_COMMUNITY): Admission: RE | Admit: 2016-12-14 | Payer: BC Managed Care – PPO | Source: Ambulatory Visit

## 2016-12-14 ENCOUNTER — Ambulatory Visit (HOSPITAL_COMMUNITY)
Admission: RE | Admit: 2016-12-14 | Discharge: 2016-12-14 | Disposition: A | Discharge status: Home / Self Care | Payer: BC Managed Care – PPO | Source: Ambulatory Visit | Attending: Internal Medicine | Admitting: Internal Medicine

## 2016-12-14 DIAGNOSIS — I4891 Unspecified atrial fibrillation: Secondary | ICD-10-CM | POA: Insufficient documentation

## 2016-12-14 MED ORDER — METOPROLOL TARTRATE 5 MG/5ML IV SOLN
INTRAVENOUS | Status: AC
  Filled 2016-12-14: qty 5

## 2016-12-14 MED ORDER — IOPAMIDOL (ISOVUE-370) INJECTION 76%
INTRAVENOUS | Status: AC
  Filled 2016-12-14: qty 100

## 2016-12-14 MED ORDER — METOPROLOL TARTRATE 5 MG/5ML IV SOLN
5.0000 mg | Freq: Once | INTRAVENOUS | Status: AC
  Administered 2016-12-14: 5 mg via INTRAVENOUS
  Filled 2016-12-14: qty 5

## 2016-12-15 ENCOUNTER — Other Ambulatory Visit: Payer: Self-pay | Admitting: Internal Medicine

## 2016-12-17 ENCOUNTER — Encounter: Payer: Self-pay | Admitting: Internal Medicine

## 2016-12-19 ENCOUNTER — Encounter (HOSPITAL_COMMUNITY): Payer: Self-pay | Admitting: Certified Registered"

## 2016-12-19 ENCOUNTER — Encounter (HOSPITAL_COMMUNITY)
Admission: RE | Disposition: A | Discharge status: Home / Self Care | Payer: Self-pay | Source: Ambulatory Visit | Attending: Internal Medicine

## 2016-12-19 ENCOUNTER — Ambulatory Visit (HOSPITAL_COMMUNITY): Payer: BC Managed Care – PPO | Admitting: Certified Registered"

## 2016-12-19 ENCOUNTER — Ambulatory Visit (HOSPITAL_COMMUNITY)
Admission: RE | Admit: 2016-12-19 | Discharge: 2016-12-19 | Disposition: A | Discharge status: Home / Self Care | Payer: BC Managed Care – PPO | Source: Ambulatory Visit | Attending: Internal Medicine | Admitting: Internal Medicine

## 2016-12-19 DIAGNOSIS — H9192 Unspecified hearing loss, left ear: Secondary | ICD-10-CM | POA: Diagnosis not present

## 2016-12-19 DIAGNOSIS — I481 Persistent atrial fibrillation: Secondary | ICD-10-CM | POA: Diagnosis not present

## 2016-12-19 DIAGNOSIS — I4892 Unspecified atrial flutter: Secondary | ICD-10-CM | POA: Diagnosis present

## 2016-12-19 DIAGNOSIS — F419 Anxiety disorder, unspecified: Secondary | ICD-10-CM | POA: Diagnosis not present

## 2016-12-19 DIAGNOSIS — I10 Essential (primary) hypertension: Secondary | ICD-10-CM | POA: Insufficient documentation

## 2016-12-19 DIAGNOSIS — R251 Tremor, unspecified: Secondary | ICD-10-CM | POA: Diagnosis not present

## 2016-12-19 DIAGNOSIS — I484 Atypical atrial flutter: Secondary | ICD-10-CM | POA: Diagnosis not present

## 2016-12-19 DIAGNOSIS — Z880 Allergy status to penicillin: Secondary | ICD-10-CM | POA: Diagnosis not present

## 2016-12-19 DIAGNOSIS — Z7901 Long term (current) use of anticoagulants: Secondary | ICD-10-CM | POA: Diagnosis not present

## 2016-12-19 DIAGNOSIS — E7800 Pure hypercholesterolemia, unspecified: Secondary | ICD-10-CM | POA: Insufficient documentation

## 2016-12-19 DIAGNOSIS — Z87891 Personal history of nicotine dependence: Secondary | ICD-10-CM | POA: Diagnosis not present

## 2016-12-19 HISTORY — PX: ATRIAL FIBRILLATION ABLATION: EP1191

## 2016-12-19 LAB — POTASSIUM: Potassium: 3.3 mmol/L — ABNORMAL LOW (ref 3.5–5.1)

## 2016-12-19 LAB — POCT ACTIVATED CLOTTING TIME: Activated Clotting Time: 180 seconds

## 2016-12-19 SURGERY — ATRIAL FIBRILLATION ABLATION
Anesthesia: General

## 2016-12-19 MED ORDER — HEPARIN SODIUM (PORCINE) 1000 UNIT/ML IJ SOLN
INTRAMUSCULAR | Status: AC
  Filled 2016-12-19: qty 1

## 2016-12-19 MED ORDER — MAGNESIUM 500 MG PO TABS: 500.0000 mg | ORAL_TABLET | Freq: Every day | ORAL | Status: DC

## 2016-12-19 MED ORDER — HYDROCODONE-ACETAMINOPHEN 5-325 MG PO TABS
ORAL_TABLET | ORAL | Status: AC
  Filled 2016-12-19: qty 2

## 2016-12-19 MED ORDER — POTASSIUM CHLORIDE CRYS ER 20 MEQ PO TBCR
40.0000 meq | EXTENDED_RELEASE_TABLET | Freq: Every evening | ORAL | Status: DC
  Administered 2016-12-19: 40 meq via ORAL
  Filled 2016-12-19: qty 2

## 2016-12-19 MED ORDER — MAGNESIUM OXIDE 400 (241.3 MG) MG PO TABS
400.0000 mg | ORAL_TABLET | Freq: Every day | ORAL | Status: DC
  Administered 2016-12-19: 15:00:00 400 mg via ORAL
  Filled 2016-12-19: qty 1

## 2016-12-19 MED ORDER — PROTAMINE SULFATE 10 MG/ML IV SOLN
INTRAVENOUS | Status: DC | PRN
  Administered 2016-12-19: 30 mg via INTRAVENOUS

## 2016-12-19 MED ORDER — SODIUM CHLORIDE 0.9% FLUSH: 3.0000 mL | INTRAVENOUS | Status: DC | PRN

## 2016-12-19 MED ORDER — ONDANSETRON HCL 4 MG/2ML IJ SOLN: 4.0000 mg | Freq: Four times a day (QID) | INTRAMUSCULAR | Status: DC | PRN

## 2016-12-19 MED ORDER — PHENYLEPHRINE HCL 10 MG/ML IJ SOLN
INTRAMUSCULAR | Status: DC | PRN
  Administered 2016-12-19: 80 ug via INTRAVENOUS
  Administered 2016-12-19: 40 ug via INTRAVENOUS

## 2016-12-19 MED ORDER — ACETAMINOPHEN 325 MG PO TABS: 650.0000 mg | ORAL_TABLET | ORAL | Status: DC | PRN

## 2016-12-19 MED ORDER — PANTOPRAZOLE SODIUM 40 MG PO TBEC: 40.0000 mg | DELAYED_RELEASE_TABLET | Freq: Every day | ORAL | 0 refills | Status: DC

## 2016-12-19 MED ORDER — SODIUM CHLORIDE 0.9 % IV SOLN: 250.0000 mL | INTRAVENOUS | Status: DC | PRN

## 2016-12-19 MED ORDER — HYDROCODONE-ACETAMINOPHEN 5-325 MG PO TABS
1.0000 | ORAL_TABLET | ORAL | Status: DC | PRN
  Administered 2016-12-19 (×2): 2 via ORAL
  Filled 2016-12-19: qty 2

## 2016-12-19 MED ORDER — SUGAMMADEX SODIUM 200 MG/2ML IV SOLN
INTRAVENOUS | Status: DC | PRN
  Administered 2016-12-19: 200 mg via INTRAVENOUS

## 2016-12-19 MED ORDER — PROPOFOL 10 MG/ML IV BOLUS
INTRAVENOUS | Status: DC | PRN
  Administered 2016-12-19: 150 mg via INTRAVENOUS

## 2016-12-19 MED ORDER — LISINOPRIL-HYDROCHLOROTHIAZIDE 20-25 MG PO TABS: 1.0000 | ORAL_TABLET | Freq: Every day | ORAL | Status: DC

## 2016-12-19 MED ORDER — LIDOCAINE HCL (CARDIAC) 20 MG/ML IV SOLN
INTRAVENOUS | Status: DC | PRN
  Administered 2016-12-19: 100 mg via INTRAVENOUS

## 2016-12-19 MED ORDER — IOPAMIDOL (ISOVUE-370) INJECTION 76%
INTRAVENOUS | Status: AC
  Filled 2016-12-19: qty 50

## 2016-12-19 MED ORDER — ROCURONIUM BROMIDE 100 MG/10ML IV SOLN
INTRAVENOUS | Status: DC | PRN
  Administered 2016-12-19: 10 mg via INTRAVENOUS
  Administered 2016-12-19: 50 mg via INTRAVENOUS
  Administered 2016-12-19: 20 mg via INTRAVENOUS

## 2016-12-19 MED ORDER — HEPARIN SODIUM (PORCINE) 1000 UNIT/ML IJ SOLN
INTRAMUSCULAR | Status: DC | PRN
  Administered 2016-12-19: 5000 [IU] via INTRAVENOUS
  Administered 2016-12-19: 1000 [IU] via INTRAVENOUS
  Administered 2016-12-19: 12000 [IU] via INTRAVENOUS
  Administered 2016-12-19: 5000 [IU] via INTRAVENOUS

## 2016-12-19 MED ORDER — APIXABAN 5 MG PO TABS
5.0000 mg | ORAL_TABLET | Freq: Two times a day (BID) | ORAL | Status: DC
  Administered 2016-12-19: 5 mg via ORAL
  Filled 2016-12-19: qty 1

## 2016-12-19 MED ORDER — PHENYLEPHRINE HCL 10 MG/ML IJ SOLN
INTRAVENOUS | Status: DC | PRN
  Administered 2016-12-19: 25 ug/min via INTRAVENOUS

## 2016-12-19 MED ORDER — DEXAMETHASONE SODIUM PHOSPHATE 10 MG/ML IJ SOLN
INTRAMUSCULAR | Status: DC | PRN
  Administered 2016-12-19: 10 mg via INTRAVENOUS

## 2016-12-19 MED ORDER — IOPAMIDOL (ISOVUE-370) INJECTION 76%
INTRAVENOUS | Status: DC | PRN
  Administered 2016-12-19: 5 mL via INTRAVENOUS

## 2016-12-19 MED ORDER — SODIUM CHLORIDE 0.9% FLUSH: 3.0000 mL | Freq: Two times a day (BID) | INTRAVENOUS | Status: DC

## 2016-12-19 MED ORDER — FENTANYL CITRATE (PF) 100 MCG/2ML IJ SOLN
INTRAMUSCULAR | Status: DC | PRN
  Administered 2016-12-19 (×2): 25 ug via INTRAVENOUS
  Administered 2016-12-19: 75 ug via INTRAVENOUS
  Administered 2016-12-19 (×3): 25 ug via INTRAVENOUS

## 2016-12-19 MED ORDER — SODIUM CHLORIDE 0.9 % IV SOLN
INTRAVENOUS | Status: DC
  Administered 2016-12-19 (×3): via INTRAVENOUS

## 2016-12-19 MED ORDER — LISINOPRIL 10 MG PO TABS: 20.0000 mg | ORAL_TABLET | Freq: Every day | ORAL | Status: DC

## 2016-12-19 MED ORDER — HYDROCHLOROTHIAZIDE 25 MG PO TABS: 25.0000 mg | ORAL_TABLET | Freq: Every day | ORAL | Status: DC

## 2016-12-19 MED ORDER — BUPIVACAINE HCL (PF) 0.25 % IJ SOLN
INTRAMUSCULAR | Status: AC
  Filled 2016-12-19: qty 30

## 2016-12-19 MED ORDER — HEPARIN (PORCINE) IN NACL 2-0.9 UNIT/ML-% IJ SOLN
INTRAMUSCULAR | Status: AC | PRN
  Administered 2016-12-19: 500 mL

## 2016-12-19 MED ORDER — POTASSIUM CHLORIDE CRYS ER 20 MEQ PO TBCR
40.0000 meq | EXTENDED_RELEASE_TABLET | Freq: Once | ORAL | Status: AC
  Administered 2016-12-19: 40 meq via ORAL

## 2016-12-19 MED ORDER — AMLODIPINE BESYLATE 5 MG PO TABS: 5.0000 mg | ORAL_TABLET | Freq: Every day | ORAL | Status: DC

## 2016-12-19 MED ORDER — MIDAZOLAM HCL 5 MG/5ML IJ SOLN
INTRAMUSCULAR | Status: DC | PRN
  Administered 2016-12-19: 2 mg via INTRAVENOUS

## 2016-12-19 MED ORDER — POTASSIUM CHLORIDE 20 MEQ/15ML (10%) PO SOLN
ORAL | Status: AC
  Filled 2016-12-19: qty 30

## 2016-12-19 MED ORDER — ONDANSETRON HCL 4 MG/2ML IJ SOLN
INTRAMUSCULAR | Status: DC | PRN
  Administered 2016-12-19: 4 mg via INTRAVENOUS

## 2016-12-19 MED ORDER — BUPIVACAINE HCL (PF) 0.25 % IJ SOLN
INTRAMUSCULAR | Status: DC | PRN
  Administered 2016-12-19: 30 mL

## 2016-12-19 SURGICAL SUPPLY — 18 items
BLANKET WARM UNDERBOD FULL ACC (MISCELLANEOUS) ×2 IMPLANT
CATH MAPPNG PENTARAY F 2-6-2MM (CATHETERS) ×1 IMPLANT
CATH SMTCH THERMOCOOL SF DF (CATHETERS) ×2 IMPLANT
CATH SOUNDSTAR 3D IMAGING (CATHETERS) ×2 IMPLANT
CATH WEBSTER BI DIR CS D-F CRV (CATHETERS) ×2 IMPLANT
COVER SWIFTLINK CONNECTOR (BAG) ×2 IMPLANT
NEEDLE BAYLIS TRANSSEPTAL 71CM (NEEDLE) ×2 IMPLANT
NEEDLE TRANSEP BRK 71CM 407200 (NEEDLE) ×2 IMPLANT
PACK EP LATEX FREE (CUSTOM PROCEDURE TRAY) ×1
PACK EP LF (CUSTOM PROCEDURE TRAY) ×1 IMPLANT
PAD DEFIB LIFELINK (PAD) ×2 IMPLANT
PATCH CARTO3 (PAD) ×2 IMPLANT
PENTARAY F 2-6-2MM (CATHETERS) ×2
SHEATH AVANTI 11F 11CM (SHEATH) ×2 IMPLANT
SHEATH BAYLIS TORFLEX (SHEATH) ×2 IMPLANT
SHEATH PINNACLE 7F 10CM (SHEATH) ×4 IMPLANT
SHEATH PINNACLE 9F 10CM (SHEATH) ×2 IMPLANT
TUBING SMART ABLATE COOLFLOW (TUBING) ×4 IMPLANT

## 2016-12-19 NOTE — Care Management Note (Signed)
Case Management Note  Patient Details  Name: Patrick Gray MRN: [id number] Date of Birth: November 06, 1955  Subjective/Objective:  From home, s/p afib ablation.                    Action/Plan: NCM will follow for dc needs.   Expected Discharge Date:                  Expected Discharge Plan:  Home/Self Care  In-House Referral:     Discharge planning Services  CM Consult  Post Acute Care Choice:    Choice offered to:     DME Arranged:    DME Agency:     HH Arranged:    Miguel Barrera Agency:     Status of Service:  Completed, signed off  If discussed at [employer] of Stay Meetings, dates discussed:    Additional Comments:  Zenon Mayo, RN 12/19/2016, 3:31 PM

## 2016-12-19 NOTE — Discharge Instructions (Signed)
No driving for 4 days. No lifting over 5 lbs for 1 week. No vigorous or sexual activity for 1 week. You may return to work on 12/26/16. Keep procedure site clean & dry. If you notice increased pain, swelling, bleeding or pus, call/return!  You may shower, but no soaking baths/hot tubs/pools for 1 week.   You have an appointment set up with the [facility].  Multiple studies have shown that being followed by a dedicated atrial fibrillation clinic in addition to the standard care you receive from your other physicians improves health. We believe that enrollment in the atrial fibrillation clinic will allow [place] to better care for you.   The phone number to the [facility] is [phone]. The clinic is staffed Monday through Friday from 8:30am to 5pm.  Parking Directions: The clinic is located in the Heart and Vascular Building connected to [facility]. 1)From [address] turn on to [address] and go to the 3rd entrance  (Heart and Vascular entrance) on the right. 2)Look to the right for Heart &Vascular Parking Garage. 3)A code for the entrance is required please call the clinic to receive this.   4)Take the elevators to the 1st floor. Registration is in the room with the glass walls at the end of the hallway.  If you have any trouble parking or locating the clinic, please dont hesitate to call [phone].

## 2016-12-19 NOTE — Anesthesia Preprocedure Evaluation (Addendum)
Anesthesia Evaluation  Patient identified by MRN, date of birth, ID band Patient awake    Reviewed: Allergy & Precautions, NPO status , Patient's Chart, lab work & pertinent test results  Airway Mallampati: II  TM Distance: >3 FB Neck ROM: Full    Dental  (+) Teeth Intact, Dental Advisory Given   Pulmonary former smoker,    Pulmonary exam normal        Cardiovascular hypertension, Pt. on medications Normal cardiovascular exam+ dysrhythmias Atrial Fibrillation      Neuro/Psych Anxiety    GI/Hepatic   Endo/Other    Renal/GU      Musculoskeletal   Abdominal   Peds  Hematology   Anesthesia Other Findings   Reproductive/Obstetrics                            Anesthesia Physical Anesthesia Plan  ASA: III  Anesthesia Plan: General   Post-op Pain Management:    Induction: Intravenous  PONV Risk Score and Plan: 2 and Ondansetron and Dexamethasone  Airway Management Planned: LMA  Additional Equipment:   Intra-op Plan:   Post-operative Plan: Extubation in OR  Informed Consent: I have reviewed the patients History and Physical, chart, labs and discussed the procedure including the risks, benefits and alternatives for the proposed anesthesia with the patient or authorized representative who has indicated his/her understanding and acceptance.     Plan Discussed with: CRNA and Surgeon  Anesthesia Plan Comments:         Anesthesia Quick Evaluation

## 2016-12-19 NOTE — Transfer of Care (Signed)
Immediate Anesthesia Transfer of Care Note  Patient: Patrick Gray  Procedure(s) Performed: ATRIAL FIBRILLATION ABLATION (N/A )  Patient Location: Cath Lab  Anesthesia Type:General  Level of Consciousness: awake, alert  and oriented  Airway & Oxygen Therapy: Patient Spontanous Breathing and Patient connected to nasal cannula oxygen  Post-op Assessment: Report given to RN, Post -op Vital signs reviewed and stable and Patient moving all extremities X 4  Post vital signs: Reviewed and stable  Last Vitals:  Vitals:   12/19/16 0535 12/19/16 1112  BP: (!) 156/110   Pulse: 63   Temp: 37.1 C 36.5 C  SpO2: 98%     Last Pain:  Vitals:   12/19/16 1112  TempSrc: Temporal         Complications: No apparent anesthesia complications

## 2016-12-19 NOTE — Anesthesia Preprocedure Evaluation (Signed)
Anesthesia Evaluation  Patient identified by MRN, date of birth, ID band Patient awake    Reviewed: Allergy & Precautions, NPO status , Patient's Chart, lab work & pertinent test results  Airway Mallampati: I  TM Distance: >3 FB Neck ROM: Full    Dental   Pulmonary former smoker,    Pulmonary exam normal        Cardiovascular hypertension, Pt. on medications Normal cardiovascular exam     Neuro/Psych Anxiety    GI/Hepatic   Endo/Other    Renal/GU      Musculoskeletal   Abdominal   Peds  Hematology   Anesthesia Other Findings   Reproductive/Obstetrics                             Anesthesia Physical Anesthesia Plan  ASA: III  Anesthesia Plan: General   Post-op Pain Management:    Induction: Intravenous  PONV Risk Score and Plan: 2 and Ondansetron and Dexamethasone  Airway Management Planned: LMA  Additional Equipment:   Intra-op Plan:   Post-operative Plan: Extubation in OR  Informed Consent: I have reviewed the patients History and Physical, chart, labs and discussed the procedure including the risks, benefits and alternatives for the proposed anesthesia with the patient or authorized representative who has indicated his/her understanding and acceptance.     Plan Discussed with: CRNA and Surgeon  Anesthesia Plan Comments:         Anesthesia Quick Evaluation

## 2016-12-19 NOTE — H&P (Signed)
Primary Care Physician: Patrick Contras, MD Referring Physician: [name] is a 61 y.o. male with a h/o afib 2015 who presents today for afib ablation.   He has done very well post ablation. Unfortunately, he has developed recurrence of afib and atypical appearing atrial flutter.  He reports compliance with eliquis without interruption.  Cardiac CT reviewed with the patient. Today, he denies symptoms of palpitations, chest pain, shortness of breath, orthopnea, PND, lower extremity edema, dizziness, presyncope, syncope, or neurologic sequela. Positive for fatigue in afib , hand tremor. The patient is tolerating medications without difficulties and is otherwise without complaint today.       Past Medical History:  Diagnosis Date  . Anxiety   . Deafness in left ear   . Dysrhythmia   . H/O cardiac radiofrequency ablation   . HTN (hypertension)   . Hypercholesterolemia   . Hypopotassemia   . Paroxysmal atrial fibrillation (HCC)   . Tremor         Past Surgical History:  Procedure Laterality Date  . ACOUSTIC NEUROMA RESECTION Left 19990  . APPENDECTOMY  1976  . CARDIAC CATHETERIZATION N/A 10/07/2015   Procedure: Left Heart Cath and Coronary Angiography;  Surgeon: Josue Hector, MD;  Location: [facility];  Service: Cardiovascular;  Laterality: N/A;  . CARDIOVERSION N/A 06/17/2013   Procedure: CARDIOVERSION;  Surgeon: Josue Hector, MD;  Location: [facility];  Service: Cardiovascular;  Laterality: N/A;  . CARDIOVERSION N/A 05/11/2016   Procedure: CARDIOVERSION;  Surgeon: Sanda Klein, MD;  Location: [facility];  Service: Cardiovascular;  Laterality: N/A;  . CHOLECYSTECTOMY  1976  . CLUB FOOT RELEASE  1968  . ELECTROPHYSIOLOGIC STUDY N/A 11/30/2015   Procedure: Atrial Fibrillation Ablation;  Surgeon: Thompson Grayer, MD;  Location: [facility];  Service: Cardiovascular;  Laterality: N/A;  . TEE WITHOUT CARDIOVERSION N/A 05/11/2016    Procedure: TRANSESOPHAGEAL ECHOCARDIOGRAM (TEE);  Surgeon: Sanda Klein, MD;  Location: [facility];  Service: Cardiovascular;  Laterality: N/A;  . TONSILLECTOMY  1967  . TOTAL SHOULDER ARTHROPLASTY Left 06/15/2016   Procedure: TOTAL SHOULDER ARTHROPLASTY;  Surgeon: Tania Ade, MD;  Location: [facility];  Service: Orthopedics;  Laterality: Left;  Left total shoulder arthroplasty          Current Outpatient Prescriptions  Medication Sig Dispense Refill  . amLODipine (NORVASC) 5 MG tablet Take 5 mg by mouth daily.     Marland Kitchen apixaban (ELIQUIS) 5 MG TABS tablet Take 1 tablet (5 mg total) by mouth 2 (two) times daily. 180 tablet 1  . atorvastatin (LIPITOR) 20 MG tablet Take 20 mg by mouth at bedtime.     . Cholecalciferol (VITAMIN D3 PO) Take 1 capsule by mouth daily.    . Coenzyme Q10 (COQ10 PO) Take 1 capsule by mouth daily.    Marland Kitchen docusate sodium (COLACE) 100 MG capsule Take 1 capsule (100 mg total) by mouth 3 (three) times daily as needed. 20 capsule 0  . flecainide (TAMBOCOR) 100 MG tablet TAKE 1 TABLET BY MOUTH TWICE DAILY 60 tablet 3  . KLOR-CON M20 20 MEQ tablet Take 40 mEq by mouth at bedtime.     Marland Kitchen lisinopril-hydrochlorothiazide (PRINZIDE,ZESTORETIC) 20-25 MG per tablet Take 1 tablet by mouth daily.     . Magnesium Oxide (MAG-OX PO) Take 1 tablet by mouth daily.     . metoprolol (LOPRESSOR) 50 MG tablet Take 50 mg by mouth 2 (two) times daily.     . naproxen sodium (ANAPROX) 220  MG tablet Take 220-440 mg by mouth 2 (two) times daily as needed (pain).     No current facility-administered medications for this encounter.          Allergies  Allergen Reactions  . Penicillins Rash     Has patient had a PCN reaction causing immediate rash, facial/tongue/throat swelling, SOB or lightheadedness with hypotension: No Has patient had a PCN reaction causing severe rash involving mucus membranes or skin necrosis: No Has patient had a PCN reaction that required  hospitalization No Has patient had a PCN reaction occurring within the last 10 years: No If all of the above answers are "NO", then may proceed with Cephalosporin use.     Social History        Social History  . Marital status: Married    Spouse name: N/A  . Number of children: N/A  . Years of education: N/A      Occupational History  . Not on file.         Social History Main Topics  . Smoking status: Former Research scientist (life sciences)  . Smokeless tobacco: Never Used  . Alcohol use Yes     Comment: previously 20 beers per week, now 2 beers per week  . Drug use: No  . Sexual activity: Not on file       Other Topics Concern  . Not on file      Social History Narrative   Lives in [place] wife.  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         Family History  Problem Relation Age of Onset  . Hypertension Father   . Hypertension Mother   . Stroke Mother   . CVA Mother   . Hypertension Brother   . Hypercholesterolemia Brother   . Atrial fibrillation Brother   . Congestive Heart Failure Brother       Physical Exam: Vitals:   12/19/16 0535  BP: (!) 156/110  Pulse: 63  Temp: 98.7 F (37.1 C)  SpO2: 98%     GEN- The patient is well appearing, alert and oriented x 3 today.   Head- normocephalic, atraumatic Eyes-  Sclera clear, conjunctiva pink Ears- hearing intact Oropharynx- clear Neck- supple, no JVP Lymph- no cervical lymphadenopathy Lungs- Clear to ausculation bilaterally, normal work of breathing Heart -irregular rate and rhythm, no murmurs, rubs or gallops, PMI not laterally displaced GI- soft, NT, ND, + BS Extremities- no clubbing, cyanosis, or edema     Assessment and Plan: 1.  Persistent atrial fibrillation and atypical atrial flutter s/p ablation 11/30/15 Therapeutic strategies for afib and atypical atrial flutter including medicine and ablation were discussed in detail with the patient today. Risk, benefits, and alternatives to  EP study and radiofrequency ablation for afib were also discussed in detail today. These risks include but are not limited to stroke, bleeding, vascular damage, tamponade, perforation, damage to the esophagus, lungs, and other structures, pulmonary vein stenosis, worsening renal function, and death. The patient understands these risk and wishes to proceed.  He reports compliance with eliquis without interruption.  Cardiac CT reviewed with the patient.  Thompson Grayer MD, [facility] 12/19/2016 7:28 AM

## 2016-12-19 NOTE — Anesthesia Postprocedure Evaluation (Signed)
Anesthesia Post Note  Patient: Patrick Gray  Procedure(s) Performed: ATRIAL FIBRILLATION ABLATION (N/A )     Patient location during evaluation: PACU Anesthesia Type: General Level of consciousness: awake and alert Pain management: pain level controlled Vital Signs Assessment: post-procedure vital signs reviewed and stable Respiratory status: spontaneous breathing, nonlabored ventilation, respiratory function stable and patient connected to nasal cannula oxygen Cardiovascular status: blood pressure returned to baseline and stable Postop Assessment: no apparent nausea or vomiting Anesthetic complications: no    Last Vitals:  Vitals:   12/19/16 1315 12/19/16 1320  BP: 128/88   Pulse: 91   Resp: 12   Temp:  36.7 C  SpO2: 94%     Last Pain:  Vitals:   12/19/16 1202  TempSrc: Temporal  PainSc:                  Oanh Devivo DAVID

## 2016-12-19 NOTE — Progress Notes (Signed)
Site area: 3 rt fv sheaths Site Prior to Removal:  Level 0 Pressure Applied For:  20 minutes Manual:   yes Patient Status During Pull:  stable Post Pull Site:  Level  0 Post Pull Instructions Given:  yes Post Pull Pulses Present:  palpable Dressing Applied:  Gauze and tegaderm Bedrest begins @ 1150 Comments:  IV saline locked

## 2016-12-19 NOTE — Discharge Summary (Signed)
ELECTROPHYSIOLOGY PROCEDURE DISCHARGE SUMMARY    Patient ID: Patrick Gray,  MRN: [id number], DOB/AGE: 1955/03/26 61 y.o.  Admit date: 12/19/2016 Discharge date: 12/19/16  Primary Care Physician: [name], MD  Primary Cardiologist: Dr. Johnsie Cancel Electrophysiologist: Thompson Grayer, MD  Primary Discharge Diagnosis:  1. Persistent AFib     CHA2DS2Vasc is at least 2, on Eliquis  Secondary Discharge Diagnosis:  1. HTN 2. NICM  Procedures This Admission:  1.  Electrophysiology study and radiofrequency catheter ablation on 12/19/16 by Dr Thompson Grayer.  This study demonstrated   CONCLUSIONS: 1. Left atrial clockwise mitral annular flutter upon presentation successfully ablated along the mitral isthmus.  2. Intracardiac echo reveals a moderate sized left atrium. 3. Return of electrical activity along the carina between the right superior and inferior pulmonary veins at baseline.  The left PVs were quiescent from the prior ablation procedure.   4. Successful reelectrical isolation of the right pulmonary veins  5. Additional mapping and ablation within the left atrium due to persistence of atrial fibrillation with a posterior wall box demonstrated  6. No inducible arrhythmias following ablation 7. No early apparent complications.    Brief HPI: Patrick Gray is a 61 y.o. male with a history of persistent atrial fibrillation. He has failed medical therapy with flecinide. Risks, benefits, and alternatives to catheter ablation of atrial fibrillation were reviewed with the patient who wished to proceed.  The patient underwent cardiac CT prior to the procedure which demonstrated no LAA thrombus.    Hospital Course:  The patient was admitted and underwent EPS/RFCA of atrial fibrillation with details as outlined above.  They were monitored on telemetry which demonstrated sinus rhythm.   Groin was without complication on the day of discharge.  The patient was examined buy Dr.  Rayann Heman and  considered to be stable for discharge.  Wound care and restrictions were reviewed with the patient.  The patient will be seen back by Roderic Palau, NP in 4 weeks and Dr Rayann Heman in 12 weeks for post ablation follow up.     Physical Exam: Vitals:   12/19/16 1500 12/19/16 1515 12/19/16 1530 12/19/16 1600  BP: (!) 120/92 108/71 114/83 117/78  Pulse: 95 96 95 97  Resp: 12 13 13  (!) 22  Temp:      TempSrc:      SpO2: 95% 96% 94% 95%  Weight:      Height:        GEN- The patient is well appearing, alert and oriented x 3 today.   HEENT: normocephalic, atraumatic; sclera clear, conjunctiva pink; hearing intact; oropharynx clear; neck supple  Lungs- CTA b/l, normal work of breathing.  No wheezes, rales, rhonchi Heart-  RRR, no murmurs, rubs or gallops  GI- soft, non-tender, non-distended  Extremities- no clubbing, cyanosis, or edema MS- no significant deformity or atrophy Skin- warm and dry, no rash or lesion Psych- euthymic mood, full affect Neuro- strength and sensation are intact   Labs:   Lab Results  Component Value Date   WBC 5.3 12/05/2016   HGB 15.8 12/05/2016   HCT 43.9 12/05/2016   MCV 87 12/05/2016   PLT 149 (L) 12/05/2016     Recent Labs Lab 12/19/16 0604  K 3.3*     Discharge Medications:  Allergies as of 12/19/2016      Reactions   Penicillins Rash   Has patient had a PCN reaction causing immediate rash, facial/tongue/throat swelling, SOB or lightheadedness with hypotension: No Has patient had a  PCN reaction causing severe rash involving mucus membranes or skin necrosis: No Has patient had a PCN reaction that required hospitalization No Has patient had a PCN reaction occurring within the last 10 years: No If all of the above answers are "NO", then may proceed with Cephalosporin use.      Medication List    TAKE these medications   amLODipine 5 MG tablet Commonly known as:  NORVASC Take 5 mg by mouth daily.   apixaban 5 MG Tabs tablet Commonly  known as:  ELIQUIS Take 1 tablet (5 mg total) by mouth 2 (two) times daily.   atorvastatin 20 MG tablet Commonly known as:  LIPITOR Take 20 mg by mouth every evening.   CoQ10 200 MG Caps Take 200 mg by mouth daily.   fexofenadine 180 MG tablet Commonly known as:  ALLEGRA Take 180 mg by mouth daily as needed for allergies or rhinitis.   flecainide 100 MG tablet Commonly known as:  TAMBOCOR TAKE 1 TABLET BY MOUTH TWICE DAILY   KLOR-CON M20 20 MEQ tablet Generic drug:  potassium chloride SA Take 40 mEq by mouth every evening.   lisinopril-hydrochlorothiazide 20-25 MG tablet Commonly known as:  PRINZIDE,ZESTORETIC Take 1 tablet by mouth daily.   Magnesium 500 MG Tabs Take 500 mg by mouth daily.   metoprolol tartrate 50 MG tablet Commonly known as:  LOPRESSOR Take 50 mg by mouth 2 (two) times daily.   naproxen sodium 220 MG tablet Commonly known as:  ALEVE Take 220-440 mg by mouth 2 (two) times daily as needed (pain).   pantoprazole 40 MG tablet Commonly known as:  PROTONIX Take 1 tablet (40 mg total) by mouth daily.   VITAMIN D3 PO Take 1 capsule by mouth daily.   Zinc 50 MG Tabs Take 50 mg by mouth daily.       Disposition:  Home   Follow-up Information    [facility] Follow up on 01/17/2017.   Specialty:  Cardiology Why:  2:00PM Contact information: [address] [id number] [place] [phone]       Thompson Grayer, MD Follow up on 02/26/2017.   Specialty:  Cardiology Why:  11:45AM Contact information: [place] [address] [phone]           Duration of Discharge Encounter: Greater than 30 minutes including physician time.  Army Fossa MD  12/19/2016 5:09 PM

## 2016-12-19 NOTE — Progress Notes (Signed)
Doing well s/p ablation VSS No concerns No bruit/ hematoma  Wants to go home tonight.  Thompson Grayer MD, [facility] 12/19/2016 5:07 PM

## 2016-12-19 NOTE — Anesthesia Procedure Notes (Signed)
Procedure Name: Intubation Date/Time: 12/19/2016 8:04 AM Performed by: Gaylene Brooks Pre-anesthesia Checklist: Patient identified, Emergency Drugs available, Suction available and Patient being monitored Patient Re-evaluated:Patient Re-evaluated prior to induction Oxygen Delivery Method: Circle System Utilized Preoxygenation: Pre-oxygenation with 100% oxygen Induction Type: IV induction Ventilation: Mask ventilation without difficulty and Oral airway inserted - appropriate to patient size Laryngoscope Size: Sabra Heck and 2 Grade View: Grade I Tube type: Oral Tube size: 7.5 mm Number of attempts: 1 Airway Equipment and Method: Stylet and Oral airway Placement Confirmation: ETT inserted through vocal cords under direct vision,  positive ETCO2 and breath sounds checked- equal and bilateral Secured at: 24 cm Tube secured with: Tape Dental Injury: Teeth and Oropharynx as per pre-operative assessment

## 2016-12-20 LAB — POCT ACTIVATED CLOTTING TIME
Activated Clotting Time: 224 seconds
Activated Clotting Time: 268 seconds
Activated Clotting Time: 318 seconds
Activated Clotting Time: 290 seconds

## 2016-12-22 ENCOUNTER — Ambulatory Visit (HOSPITAL_COMMUNITY)
Admission: RE | Admit: 2016-12-22 | Discharge: 2016-12-22 | Disposition: A | Discharge status: Home / Self Care | Payer: BC Managed Care – PPO | Source: Ambulatory Visit | Attending: Nurse Practitioner | Admitting: Nurse Practitioner

## 2016-12-22 ENCOUNTER — Other Ambulatory Visit: Payer: Self-pay

## 2016-12-22 ENCOUNTER — Encounter (HOSPITAL_COMMUNITY): Payer: Self-pay | Admitting: Nurse Practitioner

## 2016-12-22 VITALS — BP 130/88 | HR 63 | Wt 229.4 lb

## 2016-12-22 DIAGNOSIS — Z7901 Long term (current) use of anticoagulants: Secondary | ICD-10-CM | POA: Insufficient documentation

## 2016-12-22 DIAGNOSIS — F419 Anxiety disorder, unspecified: Secondary | ICD-10-CM | POA: Insufficient documentation

## 2016-12-22 DIAGNOSIS — R0600 Dyspnea, unspecified: Secondary | ICD-10-CM

## 2016-12-22 DIAGNOSIS — Z823 Family history of stroke: Secondary | ICD-10-CM | POA: Diagnosis not present

## 2016-12-22 DIAGNOSIS — E7800 Pure hypercholesterolemia, unspecified: Secondary | ICD-10-CM | POA: Insufficient documentation

## 2016-12-22 DIAGNOSIS — I10 Essential (primary) hypertension: Secondary | ICD-10-CM | POA: Insufficient documentation

## 2016-12-22 DIAGNOSIS — Z880 Allergy status to penicillin: Secondary | ICD-10-CM | POA: Insufficient documentation

## 2016-12-22 DIAGNOSIS — H9192 Unspecified hearing loss, left ear: Secondary | ICD-10-CM | POA: Insufficient documentation

## 2016-12-22 DIAGNOSIS — Z79899 Other long term (current) drug therapy: Secondary | ICD-10-CM | POA: Insufficient documentation

## 2016-12-22 DIAGNOSIS — I440 Atrioventricular block, first degree: Secondary | ICD-10-CM | POA: Insufficient documentation

## 2016-12-22 DIAGNOSIS — Z87891 Personal history of nicotine dependence: Secondary | ICD-10-CM | POA: Insufficient documentation

## 2016-12-22 DIAGNOSIS — Z8249 Family history of ischemic heart disease and other diseases of the circulatory system: Secondary | ICD-10-CM | POA: Diagnosis not present

## 2016-12-22 DIAGNOSIS — I480 Paroxysmal atrial fibrillation: Secondary | ICD-10-CM | POA: Diagnosis not present

## 2016-12-22 DIAGNOSIS — R601 Generalized edema: Secondary | ICD-10-CM

## 2016-12-22 MED ORDER — FUROSEMIDE 40 MG PO TABS: 40.0000 mg | ORAL_TABLET | Freq: Every day | ORAL | 1 refills | Status: DC

## 2016-12-22 NOTE — Progress Notes (Signed)
Patient ID: Patrick Gray, male   DOB: 1955-07-17, 61 y.o.   MRN: [id number]     Primary Care Physician: [name], MD Referring Physician: Aulton Routt is a 61 y.o. male with a h/o afib 2015 found by primary. Was placed on flecainide 100 mg bid and has done well until seen 6/20 and was noted to be in aflutter with RVR.Because of breakthrough afib, he was scheduled for ablation 11/30/15.  F/u in afib clinic 3 13. He was pending shoulder surgery   and has been in afib x 2 weeks. Flecainide was decreased to 50 mg bid, as he was told he could try to wean off after cardioversion. and within several days he returned to afib.   After shoulder surgery, he did have afib for a couple of weeks and did return to [place] on his own.  He is in the afib clinic 9/26. He has returned to afib since the beginning of September. He had been trying to wean of flecainide  again. He has has some tremors and when he tries to wean off drug, the tremors get better. He is toying with the idea of having  another ablation vrs a cardioversion but has guests this week into next week and will wait until then to make the decision. He is tolerating well and is rate controlled. No missed doses of eliquis. He wishes to pursue another ablation.  F/u in fib clinic after 2nd ablation 10/30. He felt well until yesterday PM when he noted some wheezing, shortness of breath and could not lie flat last night to sleep. HR has been regular. No fever, chills, chest pain, cough. His weight is noted to be up 10 lbs from my last clinic visit around one month ago. He feels bloated in his abdomen.  Today, he denies symptoms of palpitations, chest pain, shortness of breath, orthopnea, PND, lower extremity edema, dizziness, presyncope, syncope, or neurologic sequela. Positive for fatigue in afib , hand tremor. The patient is tolerating medications without difficulties and is otherwise without complaint today.   Past Medical History:    Diagnosis Date  . Anxiety   . Deafness in left ear   . Dysrhythmia   . H/O cardiac radiofrequency ablation   . HTN (hypertension)   . Hypercholesterolemia   . Hypopotassemia   . Paroxysmal atrial fibrillation (HCC)   . Tremor    Past Surgical History:  Procedure Laterality Date  . ACOUSTIC NEUROMA RESECTION Left 19990  . APPENDECTOMY  1976  . ATRIAL FIBRILLATION ABLATION N/A 12/19/2016   Procedure: ATRIAL FIBRILLATION ABLATION;  Surgeon: Thompson Grayer, MD;  Location: [facility];  Service: Cardiovascular;  Laterality: N/A;  . CARDIAC CATHETERIZATION N/A 10/07/2015   Procedure: Left Heart Cath and Coronary Angiography;  Surgeon: Josue Hector, MD;  Location: [facility];  Service: Cardiovascular;  Laterality: N/A;  . CARDIOVERSION N/A 06/17/2013   Procedure: CARDIOVERSION;  Surgeon: Josue Hector, MD;  Location: [facility];  Service: Cardiovascular;  Laterality: N/A;  . CARDIOVERSION N/A 05/11/2016   Procedure: CARDIOVERSION;  Surgeon: Sanda Klein, MD;  Location: [facility];  Service: Cardiovascular;  Laterality: N/A;  . CHOLECYSTECTOMY  1976  . CLUB FOOT RELEASE  1968  . ELECTROPHYSIOLOGIC STUDY N/A 11/30/2015   Procedure: Atrial Fibrillation Ablation;  Surgeon: Thompson Grayer, MD;  Location: [facility];  Service: Cardiovascular;  Laterality: N/A;  . TEE WITHOUT CARDIOVERSION N/A 05/11/2016   Procedure: TRANSESOPHAGEAL ECHOCARDIOGRAM (TEE);  Surgeon:  Sanda Klein, MD;  Location: [facility];  Service: Cardiovascular;  Laterality: N/A;  . TONSILLECTOMY  1967  . TOTAL SHOULDER ARTHROPLASTY Left 06/15/2016   Procedure: TOTAL SHOULDER ARTHROPLASTY;  Surgeon: Tania Ade, MD;  Location: [facility];  Service: Orthopedics;  Laterality: Left;  Left total shoulder arthroplasty    Current Outpatient Prescriptions  Medication Sig Dispense Refill  . amLODipine (NORVASC) 5 MG tablet Take 5 mg by mouth daily.     Marland Kitchen apixaban (ELIQUIS) 5 MG TABS tablet Take 1 tablet  (5 mg total) by mouth 2 (two) times daily. 180 tablet 1  . atorvastatin (LIPITOR) 20 MG tablet Take 20 mg by mouth every evening.     . Cholecalciferol (VITAMIN D3 PO) Take 1 capsule by mouth daily.    . Coenzyme Q10 (COQ10) 200 MG CAPS Take 200 mg by mouth daily.    . fexofenadine (ALLEGRA) 180 MG tablet Take 180 mg by mouth daily as needed for allergies or rhinitis.    . flecainide (TAMBOCOR) 100 MG tablet TAKE 1 TABLET BY MOUTH TWICE DAILY 60 tablet 3  . KLOR-CON M20 20 MEQ tablet Take 40 mEq by mouth every evening.     Marland Kitchen lisinopril-hydrochlorothiazide (PRINZIDE,ZESTORETIC) 20-25 MG per tablet Take 1 tablet by mouth daily.     . Magnesium 500 MG TABS Take 500 mg by mouth daily.    . metoprolol (LOPRESSOR) 50 MG tablet Take 50 mg by mouth 2 (two) times daily.     . naproxen sodium (ANAPROX) 220 MG tablet Take 220-440 mg by mouth 2 (two) times daily as needed (pain).    . pantoprazole (PROTONIX) 40 MG tablet Take 1 tablet (40 mg total) by mouth daily. 45 tablet 0  . Zinc 50 MG TABS Take 50 mg by mouth daily.    . furosemide (LASIX) 40 MG tablet Take 1 tablet (40 mg total) by mouth daily. 30 tablet 1   No current facility-administered medications for this encounter.     Allergies  Allergen Reactions  . Penicillins Rash     Has patient had a PCN reaction causing immediate rash, facial/tongue/throat swelling, SOB or lightheadedness with hypotension: No Has patient had a PCN reaction causing severe rash involving mucus membranes or skin necrosis: No Has patient had a PCN reaction that required hospitalization No Has patient had a PCN reaction occurring within the last 10 years: No If all of the above answers are "NO", then may proceed with Cephalosporin use.     Social History   Social History  . Marital status: Married    Spouse name: N/A  . Number of children: N/A  . Years of education: N/A   Occupational History  . Not on file.   Social History Main Topics  . Smoking status:  Former Research scientist (life sciences)  . Smokeless tobacco: Never Used  . Alcohol use Yes     Comment: previously 20 beers per week, now 2 beers per week  . Drug use: No  . Sexual activity: Not on file   Other Topics Concern  . Not on file   Social History Narrative   Lives in [place] wife.  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    Family History  Problem Relation Age of Onset  . Hypertension Father   . Hypertension Mother   . Stroke Mother   . CVA Mother   . Hypertension Brother   . Hypercholesterolemia Brother   . Atrial fibrillation Brother   . Congestive Heart Failure Brother  ROS- All systems are reviewed and negative except as per the HPI above  Physical Exam: Vitals:   12/22/16 1057  BP: 130/88  Pulse: 63  SpO2: 96%  Weight: 229 lb 6 oz (104 kg)    GEN- The patient is well appearing, alert and oriented x 3 today.   Head- normocephalic, atraumatic Eyes-  Sclera clear, conjunctiva pink Ears- hearing intact Oropharynx- clear Neck- supple, no JVP Lymph- no cervical lymphadenopathy Lungs- Clear to ausculation bilaterally, normal work of breathing, few rales hears in lung bases Heart -irregular rate and rhythm, no murmurs, rubs or gallops, PMI not laterally displaced GI- soft, NT, ND, + BS Extremities- no clubbing, cyanosis, or edema MS- no significant deformity or atrophy Skin- no rash or lesion Psych- euthymic mood, full affect Neuro- strength and sensation are intact  EKG- Sinus rhythm at 67 bpm, pr int 218 ms, qrs int 102 ms, qtc 467 ms Epic records reviewed  CXR-11/2-IMPRESSION: Cardiomegaly with mild interstitial pulmonary edema and trace bilateral pleural effusions.   Assessment and Plan: 1.  Paroxymal atrial fibrillation/flutter s/p afib ablation 11/30/15 Successful cardioversion 05/11/16 Repeat ablation done 10/30 and discharged same day Maintaining SR Felt great until yesterday evening when he started experiencing shortness of breath CXR showing fluid and  weight is up Will start 40 mg lasix daily with an extra 20 meq of potassium and will see back on Monday Continue apixaban for chadsvasc score of 1.  To ER if breathing worsens    Monday in afib clinic with bmet    Butch Penny C. Jayln Madeira, [facility] [address] [place], [place] [address] [phone]

## 2016-12-22 NOTE — Patient Instructions (Signed)
Your physician has recommended you make the following change in your medication:  1)Start lasix (furosemide) 40mg  once a day until we see you on Monday. 2)Add potassium to 36meq in the morning with your normal dose in the evening

## 2016-12-25 ENCOUNTER — Ambulatory Visit (HOSPITAL_COMMUNITY): Payer: BC Managed Care – PPO | Admitting: Nurse Practitioner

## 2016-12-26 ENCOUNTER — Encounter (HOSPITAL_COMMUNITY): Payer: Self-pay | Admitting: Nurse Practitioner

## 2016-12-26 ENCOUNTER — Ambulatory Visit (HOSPITAL_COMMUNITY)
Admission: RE | Admit: 2016-12-26 | Discharge: 2016-12-26 | Disposition: A | Discharge status: Home / Self Care | Payer: BC Managed Care – PPO | Source: Ambulatory Visit | Attending: Nurse Practitioner | Admitting: Nurse Practitioner

## 2016-12-26 VITALS — BP 118/84 | HR 61 | Ht 71.0 in | Wt 221.0 lb

## 2016-12-26 DIAGNOSIS — Z9889 Other specified postprocedural states: Secondary | ICD-10-CM | POA: Diagnosis not present

## 2016-12-26 DIAGNOSIS — Z79899 Other long term (current) drug therapy: Secondary | ICD-10-CM | POA: Diagnosis not present

## 2016-12-26 DIAGNOSIS — F419 Anxiety disorder, unspecified: Secondary | ICD-10-CM | POA: Diagnosis not present

## 2016-12-26 DIAGNOSIS — I4892 Unspecified atrial flutter: Secondary | ICD-10-CM | POA: Diagnosis not present

## 2016-12-26 DIAGNOSIS — Z823 Family history of stroke: Secondary | ICD-10-CM | POA: Diagnosis not present

## 2016-12-26 DIAGNOSIS — Z8249 Family history of ischemic heart disease and other diseases of the circulatory system: Secondary | ICD-10-CM | POA: Diagnosis not present

## 2016-12-26 DIAGNOSIS — I4891 Unspecified atrial fibrillation: Secondary | ICD-10-CM | POA: Diagnosis not present

## 2016-12-26 DIAGNOSIS — E7800 Pure hypercholesterolemia, unspecified: Secondary | ICD-10-CM | POA: Diagnosis not present

## 2016-12-26 DIAGNOSIS — I480 Paroxysmal atrial fibrillation: Secondary | ICD-10-CM | POA: Insufficient documentation

## 2016-12-26 DIAGNOSIS — I10 Essential (primary) hypertension: Secondary | ICD-10-CM | POA: Diagnosis not present

## 2016-12-26 DIAGNOSIS — R601 Generalized edema: Secondary | ICD-10-CM

## 2016-12-26 DIAGNOSIS — Z880 Allergy status to penicillin: Secondary | ICD-10-CM | POA: Insufficient documentation

## 2016-12-26 DIAGNOSIS — H9192 Unspecified hearing loss, left ear: Secondary | ICD-10-CM | POA: Diagnosis not present

## 2016-12-26 DIAGNOSIS — Z9049 Acquired absence of other specified parts of digestive tract: Secondary | ICD-10-CM | POA: Diagnosis not present

## 2016-12-26 DIAGNOSIS — Z87891 Personal history of nicotine dependence: Secondary | ICD-10-CM | POA: Insufficient documentation

## 2016-12-26 LAB — BASIC METABOLIC PANEL
Anion gap: 7 (ref 5–15)
BUN: 16 mg/dL (ref 6–20)
CO2: 28 mmol/L (ref 22–32)
Calcium: 9.5 mg/dL (ref 8.9–10.3)
Chloride: 101 mmol/L (ref 101–111)
Creatinine, Ser: 0.85 mg/dL (ref 0.61–1.24)
GFR calc Af Amer: >60 mL/min (ref >60)
GFR calc non Af Amer: >60 mL/min (ref >60)
Glucose, Bld: 106 mg/dL — ABNORMAL HIGH (ref 65–99)
Potassium: 3.5 mmol/L (ref 3.5–5.1)
Sodium: 136 mmol/L (ref 135–145)

## 2016-12-26 NOTE — Progress Notes (Signed)
Patient ID: Patrick Gray, male   DOB: [birth year], 61 y.o.   MRN: [id number]     Primary Care Physician: [name], MD Referring Physician: Sesar Gray is a 61 y.o. male with a h/o afib 2015 found by primary. Was placed on flecainide 100 mg bid and has done well until seen 6/20 and was noted to be in aflutter with RVR.Because of breakthrough afib, he was scheduled for ablation 11/30/15.  F/u in afib clinic 3 13. He was pending shoulder surgery   and has been in afib x 2 weeks. Flecainide was decreased to 50 mg bid, as he was told he could try to wean off after cardioversion. and within several days he returned to afib.   After shoulder surgery, he did have afib for a couple of weeks and did return to [place] on his own.  He is in the afib clinic 9/26. He has returned to afib since the beginning of September. He had been trying to wean of flecainide  again. He has has some tremors and when he tries to wean off drug, the tremors get better. He is toying with the idea of having  another ablation vrs a cardioversion but has guests this week into next week and will wait until then to make the decision. He is tolerating well and is rate controlled. No missed doses of eliquis. He wishes to pursue another ablation.  F/u in fib clinic after 2nd ablation 10/30. He felt well until yesterday PM when he noted some wheezing, shortness of breath and could not lie flat last night to sleep. HR has been regular. No fever, chills, chest pain, cough. His weight is noted to be up 10 lbs from my last clinic visit around one month ago. He feels bloated in his abdomen.  F/u in afib clinic 11/6. He was started on lasix on last visit for fluid overload following ablation. He felt well after the first doe of lasix and by Saturday night( 2 doses), he was able to lie flat to sleep. He did not take lasix this am weight is down 8 lbs. Maintaining SR.  Today, he denies symptoms of palpitations, chest pain, shortness of  breath, orthopnea, PND, lower extremity edema, dizziness, presyncope, syncope, or neurologic sequela. Positive for fatigue in afib , hand tremor. The patient is tolerating medications without difficulties and is otherwise without complaint today.   Past Medical History:  Diagnosis Date  . Anxiety   . Deafness in left ear   . Dysrhythmia   . H/O cardiac radiofrequency ablation   . HTN (hypertension)   . Hypercholesterolemia   . Hypopotassemia   . Paroxysmal atrial fibrillation (HCC)   . Tremor    Past Surgical History:  Procedure Laterality Date  . ACOUSTIC NEUROMA RESECTION Left 19990  . APPENDECTOMY  1976  . CHOLECYSTECTOMY  1976  . CLUB FOOT RELEASE  1968  . TONSILLECTOMY  1967    Current Outpatient Medications  Medication Sig Dispense Refill  . amLODipine (NORVASC) 5 MG tablet Take 5 mg by mouth daily.     Marland Kitchen apixaban (ELIQUIS) 5 MG TABS tablet Take 1 tablet (5 mg total) by mouth 2 (two) times daily. 180 tablet 1  . atorvastatin (LIPITOR) 20 MG tablet Take 20 mg by mouth every evening.     . Cholecalciferol (VITAMIN D3 PO) Take 1 capsule by mouth daily.    . Coenzyme Q10 (COQ10) 200 MG CAPS Take 200 mg by mouth daily.    Marland Kitchen  fexofenadine (ALLEGRA) 180 MG tablet Take 180 mg by mouth daily as needed for allergies or rhinitis.    . flecainide (TAMBOCOR) 100 MG tablet TAKE 1 TABLET BY MOUTH TWICE DAILY 60 tablet 3  . furosemide (LASIX) 40 MG tablet Take 1 tablet (40 mg total) by mouth daily. (Patient taking differently: Take 40 mg by mouth. ) 30 tablet 1  . KLOR-CON M20 20 MEQ tablet Take 60 mEq every evening by mouth.     Marland Kitchen lisinopril-hydrochlorothiazide (PRINZIDE,ZESTORETIC) 20-25 MG per tablet Take 1 tablet by mouth daily.     . Magnesium 500 MG TABS Take 500 mg by mouth daily.    . metoprolol (LOPRESSOR) 50 MG tablet Take 50 mg by mouth 2 (two) times daily.     . naproxen sodium (ANAPROX) 220 MG tablet Take 220-440 mg by mouth 2 (two) times daily as needed (pain).    .  pantoprazole (PROTONIX) 40 MG tablet Take 1 tablet (40 mg total) by mouth daily. 45 tablet 0  . Zinc 50 MG TABS Take 50 mg by mouth daily.     No current facility-administered medications for this encounter.     Allergies  Allergen Reactions  . Penicillins Rash     Has patient had a PCN reaction causing immediate rash, facial/tongue/throat swelling, SOB or lightheadedness with hypotension: No Has patient had a PCN reaction causing severe rash involving mucus membranes or skin necrosis: No Has patient had a PCN reaction that required hospitalization No Has patient had a PCN reaction occurring within the last 10 years: No If all of the above answers are "NO", then may proceed with Cephalosporin use.     Social History   Socioeconomic History  . Marital status: Married    Spouse name: Not on file  . Number of children: Not on file  . Years of education: Not on file  . Highest education level: Not on file  Social Needs  . Financial resource strain: Not on file  . Food insecurity - worry: Not on file  . Food insecurity - inability: Not on file  . Transportation needs - medical: Not on file  . Transportation needs - non-medical: Not on file  Occupational History  . Not on file  Tobacco Use  . Smoking status: Former Research scientist (life sciences)  . Smokeless tobacco: Never Used  Substance and Sexual Activity  . Alcohol use: Yes    Comment: previously 20 beers per week, now 2 beers per week  . Drug use: No  . Sexual activity: Not on file  Other Topics Concern  . Not on file  Social History Narrative   Lives in [place] wife.  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    Family History  Problem Relation Age of Onset  . Hypertension Father   . Hypertension Mother   . Stroke Mother   . CVA Mother   . Hypertension Brother   . Hypercholesterolemia Brother   . Atrial fibrillation Brother   . Congestive Heart Failure Brother     ROS- All systems are reviewed and negative except as per the HPI  above  Physical Exam: Vitals:   12/26/16 1529  BP: 118/84  Pulse: 61  Weight: 221 lb (100.2 kg)  Height: 5\' 11"  (1.803 m)    GEN- The patient is well appearing, alert and oriented x 3 today.   Head- normocephalic, atraumatic Eyes-  Sclera clear, conjunctiva pink Ears- hearing intact Oropharynx- clear Neck- supple, no JVP Lymph- no cervical lymphadenopathy Lungs- Clear to ausculation  bilaterally, normal work of breathing, few rales hears in lung bases Heart - regular rate and rhythm, no murmurs, rubs or gallops, PMI not laterally displaced GI- soft, NT, ND, + BS Extremities- no clubbing, cyanosis, or edema MS- no significant deformity or atrophy Skin- no rash or lesion Psych- euthymic mood, full affect Neuro- strength and sensation are intact  EKG- Not done today Epic records reviewed  CXR-11/2-IMPRESSION: Cardiomegaly with mild interstitial pulmonary edema and trace bilateral pleural effusions.   Assessment and Plan: 1.  Paroxymal atrial fibrillation/flutter s/p afib ablation 11/30/15 Successful cardioversion 05/11/16 Repeat ablation done 10/30 and discharged same day Maintaining SR Felt great until the following  evening when he started experiencing shortness of breath CXR showing fluid and weight is up He diuresed well(8 lbs) with 40 mg lasix daily with an extra 20 meq of potassium, now can stop lasix Continue apixaban for chadsvasc score of 1. bmet  F/u with afib clinic 11/28 F/u with Dr. Rayann Heman 1/7  Geroge Baseman. Amos Gaber, [facility] [address] [place],  [address] [phone]

## 2017-01-17 ENCOUNTER — Encounter (HOSPITAL_COMMUNITY): Payer: Self-pay | Admitting: Nurse Practitioner

## 2017-01-17 ENCOUNTER — Ambulatory Visit (HOSPITAL_COMMUNITY)
Admission: RE | Admit: 2017-01-17 | Discharge: 2017-01-17 | Disposition: A | Discharge status: Home / Self Care | Payer: BC Managed Care – PPO | Source: Ambulatory Visit | Attending: Nurse Practitioner | Admitting: Nurse Practitioner

## 2017-01-17 VITALS — BP 148/86 | HR 65 | Ht 71.0 in | Wt 221.0 lb

## 2017-01-17 DIAGNOSIS — I10 Essential (primary) hypertension: Secondary | ICD-10-CM | POA: Diagnosis not present

## 2017-01-17 DIAGNOSIS — I4892 Unspecified atrial flutter: Secondary | ICD-10-CM | POA: Insufficient documentation

## 2017-01-17 DIAGNOSIS — Z79899 Other long term (current) drug therapy: Secondary | ICD-10-CM | POA: Insufficient documentation

## 2017-01-17 DIAGNOSIS — Z96612 Presence of left artificial shoulder joint: Secondary | ICD-10-CM | POA: Insufficient documentation

## 2017-01-17 DIAGNOSIS — H9192 Unspecified hearing loss, left ear: Secondary | ICD-10-CM | POA: Diagnosis not present

## 2017-01-17 DIAGNOSIS — Z8249 Family history of ischemic heart disease and other diseases of the circulatory system: Secondary | ICD-10-CM | POA: Diagnosis not present

## 2017-01-17 DIAGNOSIS — Z9049 Acquired absence of other specified parts of digestive tract: Secondary | ICD-10-CM | POA: Diagnosis not present

## 2017-01-17 DIAGNOSIS — I480 Paroxysmal atrial fibrillation: Secondary | ICD-10-CM | POA: Diagnosis present

## 2017-01-17 DIAGNOSIS — Z87891 Personal history of nicotine dependence: Secondary | ICD-10-CM | POA: Insufficient documentation

## 2017-01-17 DIAGNOSIS — Z9889 Other specified postprocedural states: Secondary | ICD-10-CM | POA: Diagnosis not present

## 2017-01-17 DIAGNOSIS — Z880 Allergy status to penicillin: Secondary | ICD-10-CM | POA: Diagnosis not present

## 2017-01-17 DIAGNOSIS — Z955 Presence of coronary angioplasty implant and graft: Secondary | ICD-10-CM | POA: Diagnosis not present

## 2017-01-17 DIAGNOSIS — Z823 Family history of stroke: Secondary | ICD-10-CM | POA: Insufficient documentation

## 2017-01-17 DIAGNOSIS — E7800 Pure hypercholesterolemia, unspecified: Secondary | ICD-10-CM | POA: Insufficient documentation

## 2017-01-17 NOTE — Progress Notes (Signed)
Patient ID: Patrick Gray, male   DOB: November 12, 1955, 61 y.o.   MRN: [id number]     Primary Care Physician: Patrick Contras, MD Referring Physician: Ace Gray is a 61 y.o. male with a h/o afib 2015 found by primary. Was placed on flecainide 100 mg bid and has done well until seen 6/20 and was noted to be in aflutter with RVR.Because of breakthrough afib, he was scheduled for ablation 11/30/15.  F/u in afib clinic 3 13. He was pending shoulder surgery   and has been in afib x 2 weeks. Flecainide was decreased to 50 mg bid, as he was told he could try to wean off after cardioversion. and within several days he returned to afib.   After shoulder surgery, he did have afib for a couple of weeks and did return to [place] on his own.  He is in the afib clinic 9/26. He has returned to afib since the beginning of September. He had been trying to wean of flecainide  again. He has has some tremors and when he tries to wean off drug, the tremors get better. He is toying with the idea of having  another ablation vrs a cardioversion but has guests this week into next week and will wait until then to make the decision. He is tolerating well and is rate controlled. No missed doses of eliquis. He wishes to pursue another ablation.  F/u in fib clinic after 2nd ablation 10/30. He felt well until yesterday PM when he noted some wheezing, shortness of breath and could not lie flat last night to sleep. HR has been regular. No fever, chills, chest pain, cough. His weight is noted to be up 10 lbs from my last clinic visit around one month ago. He feels bloated in his abdomen.  F/u in afib clinic 11/6. He was started on lasix on last visit for fluid overload following ablation. He felt well after the first dose of lasix and by Saturday night( 2 doses), he was able to lie flat to sleep. He did not take lasix this am weight is down 8 lbs. Maintaining SR.  Pt is here for one month f/u ablation. He has not noted any  further afib. Weight is stable.Compliant with eliquis without any missed doses.  Today, he denies symptoms of palpitations, chest pain, shortness of breath, orthopnea, PND, lower extremity edema, dizziness, presyncope, syncope, or neurologic sequela. Positive for fatigue in afib , hand tremor. The patient is tolerating medications without difficulties and is otherwise without complaint today.   Past Medical History:  Diagnosis Date  . Anxiety   . Deafness in left ear   . Dysrhythmia   . H/O cardiac radiofrequency ablation   . HTN (hypertension)   . Hypercholesterolemia   . Hypopotassemia   . Paroxysmal atrial fibrillation (HCC)   . Tremor    Past Surgical History:  Procedure Laterality Date  . ACOUSTIC NEUROMA RESECTION Left 19990  . APPENDECTOMY  1976  . ATRIAL FIBRILLATION ABLATION N/A 12/19/2016   Procedure: ATRIAL FIBRILLATION ABLATION;  Surgeon: Patrick Grayer, MD;  Location: [facility];  Service: Cardiovascular;  Laterality: N/A;  . CARDIAC CATHETERIZATION N/A 10/07/2015   Procedure: Left Heart Cath and Coronary Angiography;  Surgeon: Patrick Hector, MD;  Location: [place] [facility];  Service: Cardiovascular;  Laterality: N/A;  . CARDIOVERSION N/A 06/17/2013   Procedure: CARDIOVERSION;  Surgeon: Patrick Hector, MD;  Location: [facility];  Service: Cardiovascular;  Laterality: N/A;  .  CARDIOVERSION N/A 05/11/2016   Procedure: CARDIOVERSION;  Surgeon: Patrick Klein, MD;  Location: [place];  Service: Cardiovascular;  Laterality: N/A;  . CHOLECYSTECTOMY  1976  . CLUB FOOT RELEASE  1968  . ELECTROPHYSIOLOGIC STUDY N/A 11/30/2015   Procedure: Atrial Fibrillation Ablation;  Surgeon: Patrick Grayer, MD;  Location: [place] [facility];  Service: Cardiovascular;  Laterality: N/A;  . TEE WITHOUT CARDIOVERSION N/A 05/11/2016   Procedure: TRANSESOPHAGEAL ECHOCARDIOGRAM (TEE);  Surgeon: Patrick Klein, MD;  Location: [place];  Service: Cardiovascular;  Laterality: N/A;  .  TONSILLECTOMY  1967  . TOTAL SHOULDER ARTHROPLASTY Left 06/15/2016   Procedure: TOTAL SHOULDER ARTHROPLASTY;  Surgeon: Patrick Ade, MD;  Location: [place];  Service: Orthopedics;  Laterality: Left;  Left total shoulder arthroplasty    Current Outpatient Medications  Medication Sig Dispense Refill  . amLODipine (NORVASC) 5 MG tablet Take 5 mg by mouth daily.     Marland Kitchen apixaban (ELIQUIS) 5 MG TABS tablet Take 1 tablet (5 mg total) by mouth 2 (two) times daily. 180 tablet 1  . atorvastatin (LIPITOR) 20 MG tablet Take 20 mg by mouth every evening.     . Cholecalciferol (VITAMIN D3 PO) Take 1 capsule by mouth daily.    . Coenzyme Q10 (COQ10) 200 MG CAPS Take 200 mg by mouth daily.    . fexofenadine (ALLEGRA) 180 MG tablet Take 180 mg by mouth daily as needed for allergies or rhinitis.    . flecainide (TAMBOCOR) 100 MG tablet TAKE 1 TABLET BY MOUTH TWICE DAILY 60 tablet 3  . KLOR-CON M20 20 MEQ tablet Take 60 mEq every evening by mouth.     Marland Kitchen lisinopril-hydrochlorothiazide (PRINZIDE,ZESTORETIC) 20-25 MG per tablet Take 1 tablet by mouth daily.     . Magnesium 500 MG TABS Take 500 mg by mouth daily.    . metoprolol (LOPRESSOR) 50 MG tablet Take 50 mg by mouth 2 (two) times daily.     . naproxen sodium (ANAPROX) 220 MG tablet Take 220-440 mg by mouth 2 (two) times daily as needed (pain).    . pantoprazole (PROTONIX) 40 MG tablet Take 1 tablet (40 mg total) by mouth daily. 45 tablet 0  . Zinc 50 MG TABS Take 50 mg by mouth daily.     No current facility-administered medications for this encounter.     Allergies  Allergen Reactions  . Penicillins Rash     Has patient had a PCN reaction causing immediate rash, facial/tongue/throat swelling, SOB or lightheadedness with hypotension: No Has patient had a PCN reaction causing severe rash involving mucus membranes or skin necrosis: No Has patient had a PCN reaction that required hospitalization No Has patient had a PCN reaction occurring within the last  10 years: No If all of the above answers are "NO", then may proceed with Cephalosporin use.     Social History   Socioeconomic History  . Marital status: Married    Spouse name: Not on file  . Number of children: Not on file  . Years of education: Not on file  . Highest education level: Not on file  Social Needs  . Financial resource strain: Not on file  . Food insecurity - worry: Not on file  . Food insecurity - inability: Not on file  . Transportation needs - medical: Not on file  . Transportation needs - non-medical: Not on file  Occupational History  . Not on file  Tobacco Use  . Smoking status: Former Research scientist (life sciences)  . Smokeless tobacco: Never Used  Substance and Sexual Activity  . Alcohol use: Yes    Comment: previously 20 beers per week, now 2 beers per week  . Drug use: No  . Sexual activity: Not on file  Other Topics Concern  . Not on file  Social History Narrative   Lives in [place] wife.  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    Family History  Problem Relation Age of Onset  . Hypertension Father   . Hypertension Mother   . Stroke Mother   . CVA Mother   . Hypertension Brother   . Hypercholesterolemia Brother   . Atrial fibrillation Brother   . Congestive Heart Failure Brother     ROS- All systems are reviewed and negative except as per the HPI above  Physical Exam: Vitals:   01/17/17 1358  BP: (!) 148/86  Pulse: 65  Weight: 221 lb (100.2 kg)  Height: 5\' 11"  (1.803 m)    GEN- The patient is well appearing, alert and oriented x 3 today.   Head- normocephalic, atraumatic Eyes-  Sclera clear, conjunctiva pink Ears- hearing intact Oropharynx- clear Neck- supple, no JVP Lymph- no cervical lymphadenopathy Lungs- Clear to ausculation bilaterally, normal work of breathing, few rales hears in lung bases Heart - regular rate and rhythm, no murmurs, rubs or gallops, PMI not laterally displaced GI- soft, NT, ND, + BS Extremities- no clubbing, cyanosis,  or edema MS- no significant deformity or atrophy Skin- no rash or lesion Psych- euthymic mood, full affect Neuro- strength and sensation are intact  EKG-  SR with first degree AV block, pr int 226 ms , qrs int 96 ms, qtc 443 ms Epic records reviewed  CXR-11/2-IMPRESSION: Cardiomegaly with mild interstitial pulmonary edema and trace bilateral pleural effusions.   Assessment and Plan: 1.  Paroxymal atrial fibrillation/flutter, s/p afib ablation 11/30/15 and 12/19/16 Maintaining SR Felt great after ablation for two days when he started experiencing shortness of breath CXR showing fluid and weight was up He diuresed well(8 lbs) with 40 mg lasix daily short term, now off Continue apixaban for chadsvasc score of 1 for 3 months following ablation   F/u with Dr. Rayann Heman 1/7  Patrick Gray. Keontay Vora, [facility] [address] [place], [place] [address] [phone]

## 2017-01-22 ENCOUNTER — Ambulatory Visit (HOSPITAL_COMMUNITY): Payer: BC Managed Care – PPO | Attending: Internal Medicine

## 2017-01-22 DIAGNOSIS — I480 Paroxysmal atrial fibrillation: Secondary | ICD-10-CM

## 2017-01-23 ENCOUNTER — Other Ambulatory Visit (HOSPITAL_COMMUNITY): Payer: Self-pay | Admitting: Nurse Practitioner

## 2017-02-26 ENCOUNTER — Ambulatory Visit: Payer: BC Managed Care – PPO | Admitting: Internal Medicine

## 2017-02-26 ENCOUNTER — Encounter: Payer: Self-pay | Admitting: Internal Medicine

## 2017-02-26 VITALS — BP 146/92 | HR 64 | Ht 70.0 in | Wt 227.8 lb

## 2017-02-26 DIAGNOSIS — I480 Paroxysmal atrial fibrillation: Secondary | ICD-10-CM | POA: Diagnosis not present

## 2017-02-26 DIAGNOSIS — I10 Essential (primary) hypertension: Secondary | ICD-10-CM

## 2017-02-26 NOTE — Patient Instructions (Signed)
Medication Instructions:  Your physician has recommended you make the following change in your medication:  1. STOP Flecainide  * If you need a refill on your cardiac medications before your next appointment, please call your pharmacy. *  Labwork: None ordered  Testing/Procedures: None ordered  Follow-Up: Your physician recommends that you schedule a follow-up appointment in: 4 months with Dr. Rayann Heman.  Thank you for choosing [facility]!!

## 2017-02-26 NOTE — Progress Notes (Signed)
PCP: Patrick Contras, MD Primary Cardiologist: Dr Patrick Gray is a 62 y.o. male who presents today for routine electrophysiology followup.  Since his recent afib ablation, the patient reports doing very well.  he denies procedure related complications and is pleased with the results of the procedure.  Today, he denies symptoms of palpitations, chest pain, shortness of breath,  lower extremity edema, dizziness, presyncope, or syncope.  The patient is otherwise without complaint today.   Past Medical History:  Diagnosis Date  . Anxiety   . Deafness in left ear   . Dysrhythmia   . H/O cardiac radiofrequency ablation   . HTN (hypertension)   . Hypercholesterolemia   . Hypopotassemia   . Paroxysmal atrial fibrillation (HCC)   . Tremor    Past Surgical History:  Procedure Laterality Date  . ACOUSTIC NEUROMA RESECTION Left 19990  . APPENDECTOMY  1976  . ATRIAL FIBRILLATION ABLATION N/A 12/19/2016   Procedure: ATRIAL FIBRILLATION ABLATION;  Surgeon: Patrick Grayer, MD;  Location: [place] [facility];  Service: Cardiovascular;  Laterality: N/A;  . CARDIAC CATHETERIZATION N/A 10/07/2015   Procedure: Left Heart Cath and Coronary Angiography;  Surgeon: Patrick Hector, MD;  Location: [place] [facility];  Service: Cardiovascular;  Laterality: N/A;  . CARDIOVERSION N/A 06/17/2013   Procedure: CARDIOVERSION;  Surgeon: Patrick Hector, MD;  Location: [place];  Service: Cardiovascular;  Laterality: N/A;  . CARDIOVERSION N/A 05/11/2016   Procedure: CARDIOVERSION;  Surgeon: Patrick Klein, MD;  Location: [place] [facility];  Service: Cardiovascular;  Laterality: N/A;  . CHOLECYSTECTOMY  1976  . CLUB FOOT RELEASE  1968  . ELECTROPHYSIOLOGIC STUDY N/A 11/30/2015   Procedure: Atrial Fibrillation Ablation;  Surgeon: Patrick Grayer, MD;  Location: [place] [facility];  Service: Cardiovascular;  Laterality: N/A;  . TEE WITHOUT CARDIOVERSION N/A 05/11/2016   Procedure: TRANSESOPHAGEAL ECHOCARDIOGRAM (TEE);   Surgeon: Patrick Klein, MD;  Location: [place];  Service: Cardiovascular;  Laterality: N/A;  . TONSILLECTOMY  1967  . TOTAL SHOULDER ARTHROPLASTY Left 06/15/2016   Procedure: TOTAL SHOULDER ARTHROPLASTY;  Surgeon: Patrick Ade, MD;  Location: [facility];  Service: Orthopedics;  Laterality: Left;  Left total shoulder arthroplasty    ROS- all systems are personally reviewed and negatives except as per HPI above  Current Outpatient Medications  Medication Sig Dispense Refill  . amLODipine (NORVASC) 5 MG tablet Take 5 mg by mouth daily.     Marland Kitchen apixaban (ELIQUIS) 5 MG TABS tablet Take 1 tablet (5 mg total) by mouth 2 (two) times daily. 180 tablet 1  . atorvastatin (LIPITOR) 20 MG tablet Take 20 mg by mouth every evening.     . Cholecalciferol (VITAMIN D3 PO) Take 1 capsule by mouth daily.    . Coenzyme Q10 (COQ10) 200 MG CAPS Take 200 mg by mouth daily.    . fexofenadine (ALLEGRA) 180 MG tablet Take 180 mg by mouth daily as needed for allergies or rhinitis.    . flecainide (TAMBOCOR) 100 MG tablet TAKE 1 TABLET BY MOUTH TWICE DAILY 60 tablet 3  . KLOR-CON M20 20 MEQ tablet Take 60 mEq every evening by mouth.     Marland Kitchen lisinopril-hydrochlorothiazide (PRINZIDE,ZESTORETIC) 20-25 MG per tablet Take 1 tablet by mouth daily.     . Magnesium 500 MG TABS Take 500 mg by mouth daily.    . metoprolol (LOPRESSOR) 50 MG tablet Take 50 mg by mouth 2 (two) times daily.     . naproxen sodium (ANAPROX) 220 MG tablet Take  220-440 mg by mouth 2 (two) times daily as needed (pain).    . Zinc 50 MG TABS Take 50 mg by mouth daily.    . pantoprazole (PROTONIX) 40 MG tablet Take 1 tablet (40 mg total) by mouth daily. 45 tablet 0   No current facility-administered medications for this visit.     Physical Exam: Vitals:   02/26/17 1221  BP: (!) 146/92  Pulse: 64  Weight: 227 lb 12.8 oz (103.3 kg)  Height: 5\' 10"  (1.778 m)    GEN- The patient is well appearing, alert and oriented x 3 today.   Head- normocephalic,  atraumatic Eyes-  Sclera clear, conjunctiva pink Ears- hearing intact Oropharynx- clear Lungs- Clear to ausculation bilaterally, normal work of breathing Heart- Regular rate and rhythm, no murmurs, rubs or gallops, PMI not laterally displaced GI- soft, NT, ND, + BS Extremities- no clubbing, cyanosis, or edema Neuro- resting tremor of L arm  EKG tracing ordered today is personally reviewed and shows sinus rhythm 64 bpm, LAD  Assessment and Plan:  1. Paroxysmal atrial fibrillation Doing well s/p ablation, without recurrence Stop flecainide due to his tremor chads2vasc score is 1.  He wishes to stay on eliquis.  We discussed pros and cons at length.  2. HTN Elevated today, though he reports good control at home. No change required today  3. Nonischemic CM EF has recovered with sinus rhythm  Return to see me in 4 months  Patrick Grayer MD, [facility] 02/26/2017 12:26 PM

## 2017-05-18 IMAGING — CR DG CHEST 2V
2 series · 2 of 2 positions shown · non-contrast
Comparison: CT 04/24/2016.  CT 11/24/2015.

CLINICAL DATA: Shoulder replacement .

EXAM:
CHEST  2 VIEW

[w chest pa]
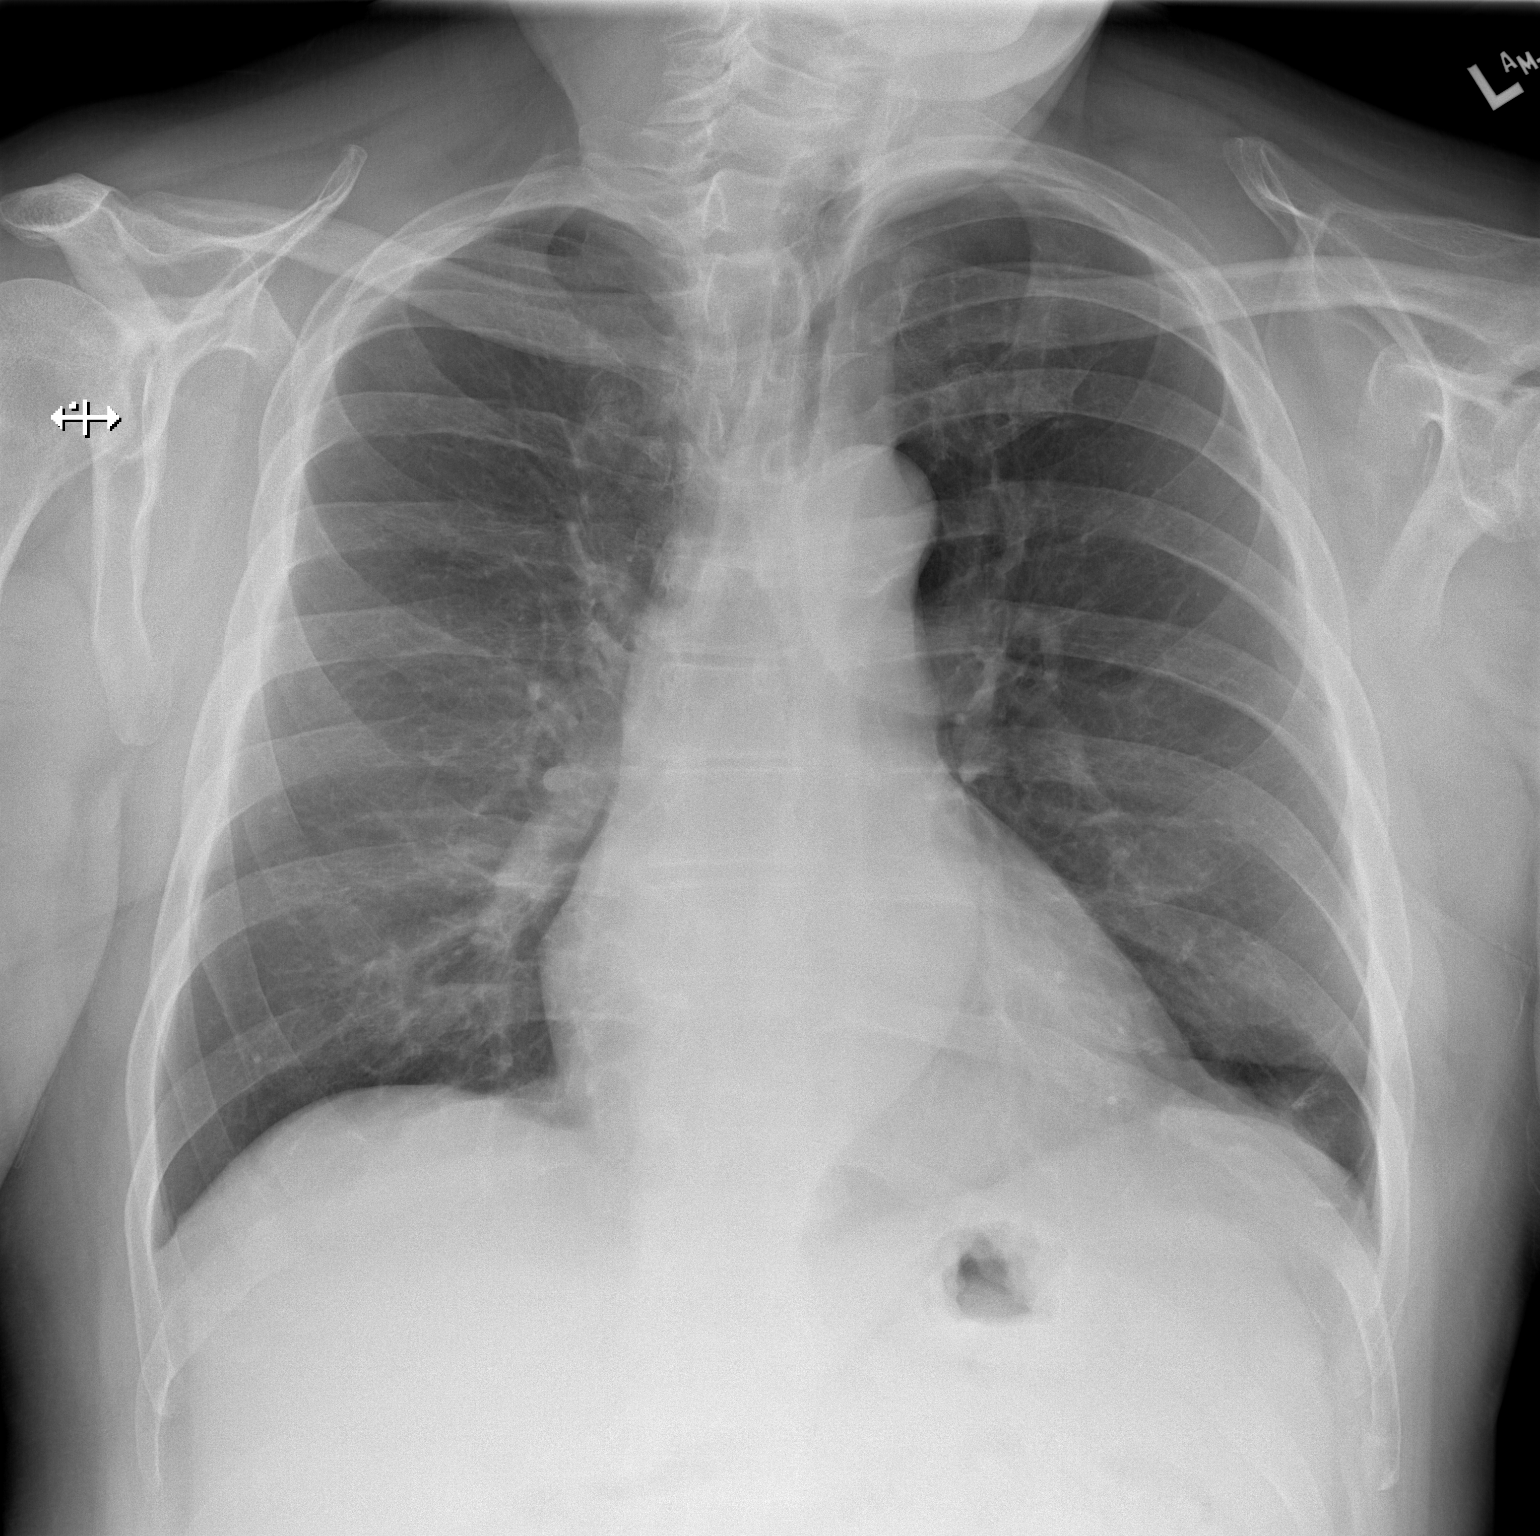

[w chest lat]
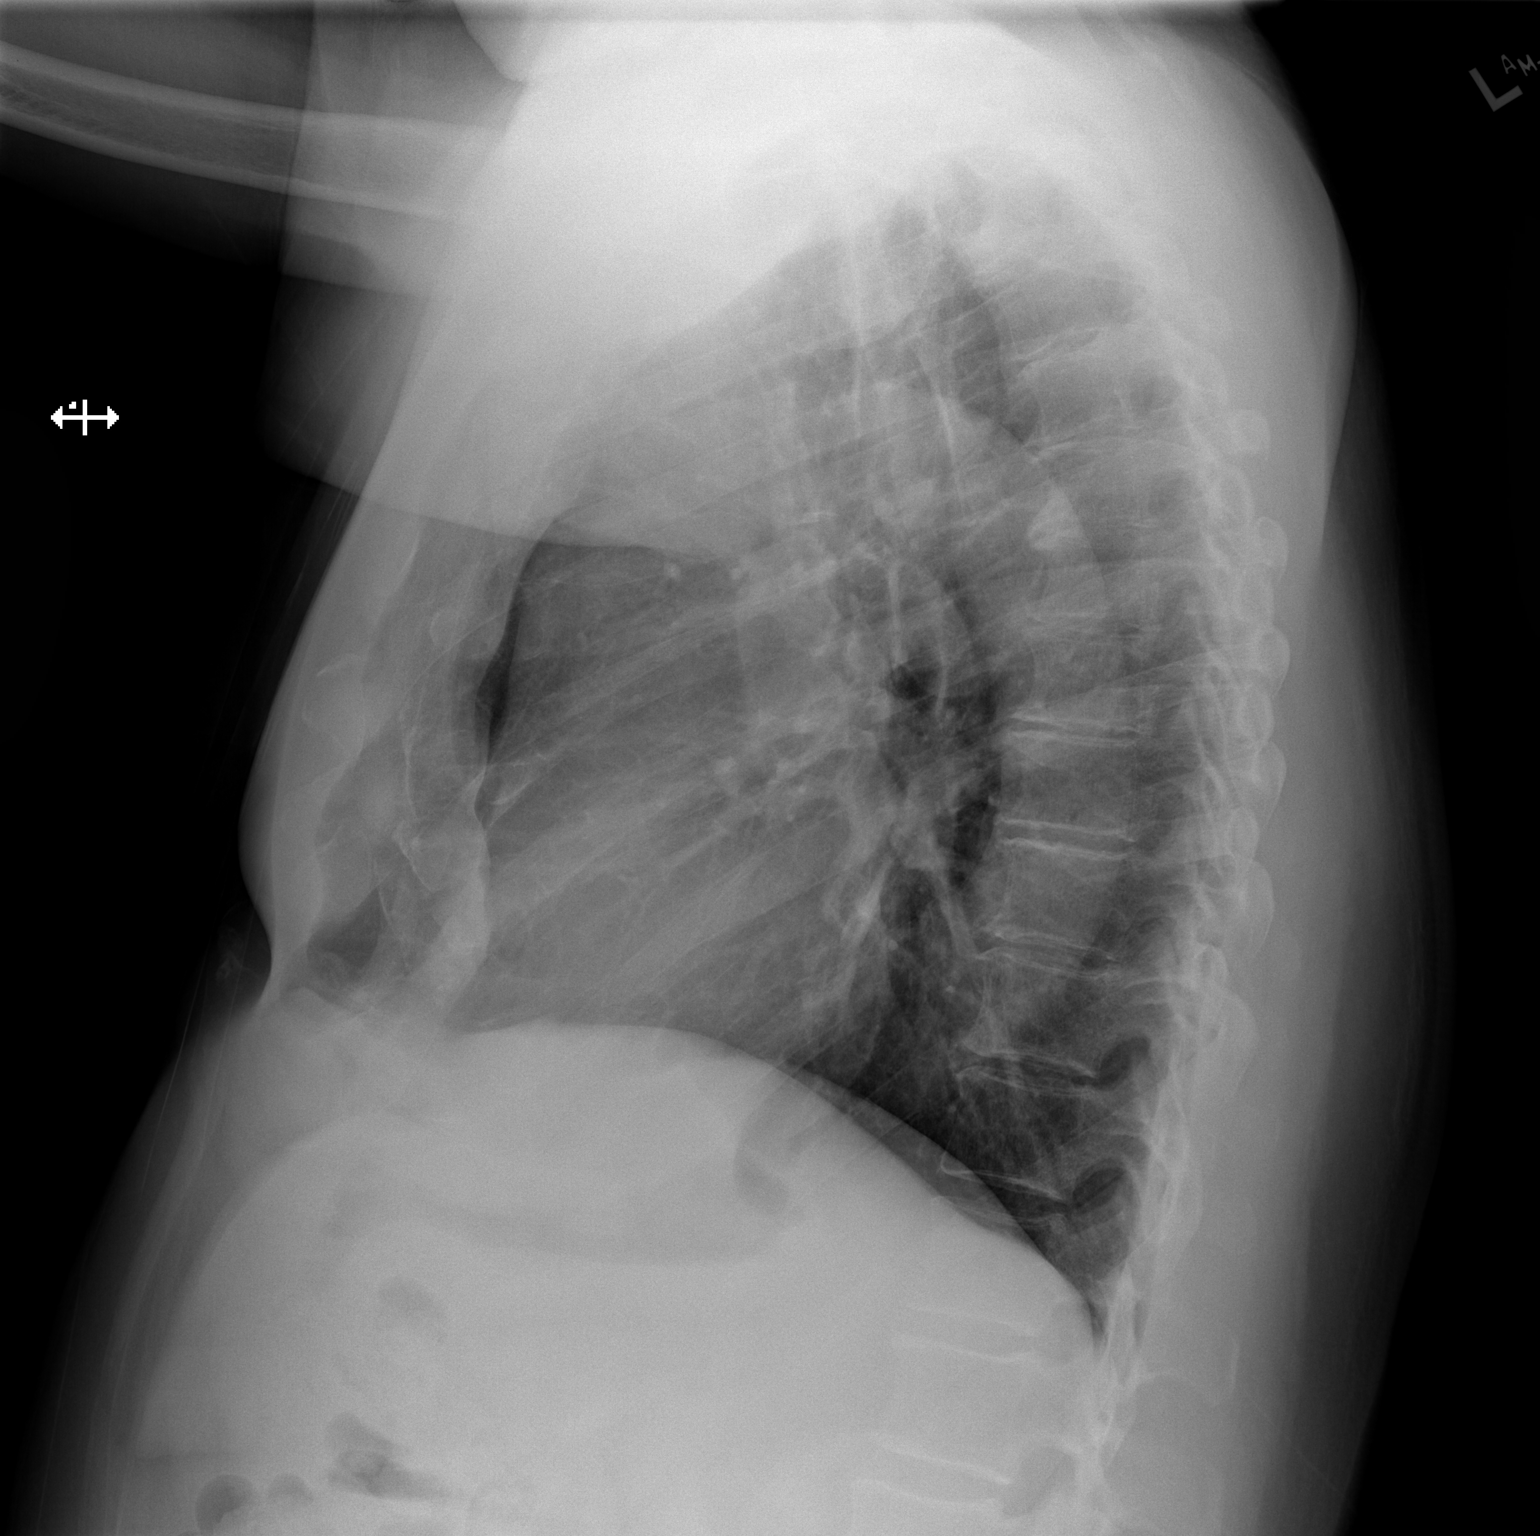

[2 of 2 positions shown; findings below may reference images not displayed]

FINDINGS: Mediastinum hilar structures are normal. Lungs are clear. Heart size
normal. No pleural effusion or pneumothorax. Degenerative changes
thoracic spine. Pectus deformity noted.
IMPRESSION: No acute abnormality identified.

## 2017-05-22 ENCOUNTER — Ambulatory Visit (HOSPITAL_COMMUNITY)
Admission: RE | Admit: 2017-05-22 | Discharge: 2017-05-22 | Disposition: A | Discharge status: Home / Self Care | Payer: BC Managed Care – PPO | Source: Ambulatory Visit | Attending: Nurse Practitioner | Admitting: Nurse Practitioner

## 2017-05-22 ENCOUNTER — Encounter (HOSPITAL_COMMUNITY): Payer: Self-pay | Admitting: Nurse Practitioner

## 2017-05-22 VITALS — BP 118/84 | HR 91 | Ht 70.0 in | Wt 226.0 lb

## 2017-05-22 DIAGNOSIS — Z96612 Presence of left artificial shoulder joint: Secondary | ICD-10-CM | POA: Diagnosis not present

## 2017-05-22 DIAGNOSIS — I4891 Unspecified atrial fibrillation: Secondary | ICD-10-CM | POA: Diagnosis present

## 2017-05-22 DIAGNOSIS — I480 Paroxysmal atrial fibrillation: Secondary | ICD-10-CM | POA: Insufficient documentation

## 2017-05-22 DIAGNOSIS — I10 Essential (primary) hypertension: Secondary | ICD-10-CM | POA: Insufficient documentation

## 2017-05-22 DIAGNOSIS — Z79899 Other long term (current) drug therapy: Secondary | ICD-10-CM | POA: Insufficient documentation

## 2017-05-22 DIAGNOSIS — E7800 Pure hypercholesterolemia, unspecified: Secondary | ICD-10-CM | POA: Insufficient documentation

## 2017-05-22 DIAGNOSIS — Z8249 Family history of ischemic heart disease and other diseases of the circulatory system: Secondary | ICD-10-CM | POA: Diagnosis not present

## 2017-05-22 DIAGNOSIS — Z880 Allergy status to penicillin: Secondary | ICD-10-CM | POA: Insufficient documentation

## 2017-05-22 DIAGNOSIS — R251 Tremor, unspecified: Secondary | ICD-10-CM | POA: Diagnosis not present

## 2017-05-22 DIAGNOSIS — H9192 Unspecified hearing loss, left ear: Secondary | ICD-10-CM | POA: Insufficient documentation

## 2017-05-22 DIAGNOSIS — Z823 Family history of stroke: Secondary | ICD-10-CM | POA: Insufficient documentation

## 2017-05-22 DIAGNOSIS — I4892 Unspecified atrial flutter: Secondary | ICD-10-CM

## 2017-05-22 DIAGNOSIS — Z9889 Other specified postprocedural states: Secondary | ICD-10-CM | POA: Insufficient documentation

## 2017-05-22 DIAGNOSIS — Z87891 Personal history of nicotine dependence: Secondary | ICD-10-CM | POA: Insufficient documentation

## 2017-05-22 DIAGNOSIS — Z7901 Long term (current) use of anticoagulants: Secondary | ICD-10-CM | POA: Diagnosis not present

## 2017-05-22 LAB — CBC
HCT: 46.5 % (ref 39.0–52.0)
Hemoglobin: 16.6 g/dL (ref 13.0–17.0)
MCH: 31 pg (ref 26.0–34.0)
MCHC: 35.7 g/dL (ref 30.0–36.0)
MCV: 86.9 fL (ref 78.0–100.0)
Platelets: 142 10*3/uL — ABNORMAL LOW (ref 150–400)
RBC: 5.35 MIL/uL (ref 4.22–5.81)
RDW: 13.2 % (ref 11.5–15.5)
WBC: 4.8 10*3/uL (ref 4.0–10.5)

## 2017-05-22 LAB — BASIC METABOLIC PANEL
Anion gap: 8 (ref 5–15)
BUN: 14 mg/dL (ref 6–20)
CO2: 29 mmol/L (ref 22–32)
Calcium: 9.9 mg/dL (ref 8.9–10.3)
Chloride: 101 mmol/L (ref 101–111)
Creatinine, Ser: 0.83 mg/dL (ref 0.61–1.24)
GFR calc Af Amer: >60 mL/min (ref >60)
GFR calc non Af Amer: >60 mL/min (ref >60)
Glucose, Bld: 108 mg/dL — ABNORMAL HIGH (ref 65–99)
Potassium: 3.7 mmol/L (ref 3.5–5.1)
Sodium: 138 mmol/L (ref 135–145)

## 2017-05-22 NOTE — H&P (View-Only) (Signed)
Patient ID: Patrick Gray, male   DOB: [birth year], 62 y.o.   MRN: [id number]     Primary Care Physician: [name], MD Referring Physician: Juelz Whittenberg is a 62 y.o. male with a h/o afib 2015 found by primary. Was placed on flecainide 100 mg bid and has done well until seen 6/20 and was noted to be in aflutter with RVR.Because of breakthrough afib, he was scheduled for ablation 11/30/15.  F/u in afib clinic 3 13. He was pending shoulder surgery   and has been in afib x 2 weeks. Flecainide was decreased to 50 mg bid, as he was told he could try to wean off after cardioversion. and within several days he returned to afib.   After shoulder surgery, he did have afib for a couple of weeks and did return to [place] on his own.  He is in the afib clinic 11/15/16. He has returned to afib since the beginning of September. He had been trying to wean of flecainide  again. He has has some tremors and when he tries to wean off drug, the tremors get better. He is toying with the idea of having  another ablation vrs a cardioversion but has guests this week into next week and will wait until then to make the decision. He is tolerating well and is rate controlled. No missed doses of eliquis. He wishes to pursue another ablation.  F/u in fib clinic after 2nd ablation 12/19/16. He felt well until yesterday PM when he noted some wheezing, shortness of breath and could not lie flat last night to sleep. HR has been regular. No fever, chills, chest pain, cough. His weight is noted to be up 10 lbs from my last clinic visit around one month ago. He feels bloated in his abdomen.  F/u in afib clinic 12/26/16. He was started on lasix on last visit for fluid overload following ablation. He felt well after the first dose of lasix and by Saturday night( 2 doses), he was able to lie flat to sleep. He did not take lasix this am weight is down 8 lbs. Maintaining SR.  Pt is here for one month f/u ablation. He has not noted  any further afib. Weight is stable.Compliant with eliquis without any missed doses.  Pt being seen in clinic after calling to office, being out of rhythm x 2 weeks. He feels the trigger was waiting almost 2 weeks for test results for his wife, who has a h/o cancer. She got better news  than was expected but she is still pending surgery in the near future. Pt had stopped flecainide for worsening tremor of left arm, improved off flecainide. He took two doses of flecainide when he got out of rhythm and tremor worsened, so he says that he will no longer try flecainide. He states that he has never had tremor evaluated by neurologist, has mentioned to PCP.  Today, he denies symptoms of palpitations, chest pain, shortness of breath, orthopnea, PND, lower extremity edema, dizziness, presyncope, syncope, or neurologic sequela. Positive for fatigue in afib , hand tremor. The patient is tolerating medications without difficulties and is otherwise without complaint today.   Past Medical History:  Diagnosis Date  . Anxiety   . Deafness in left ear   . Dysrhythmia   . H/O cardiac radiofrequency ablation   . HTN (hypertension)   . Hypercholesterolemia   . Hypopotassemia   . Paroxysmal atrial fibrillation (HCC)   . Tremor  Past Surgical History:  Procedure Laterality Date  . ACOUSTIC NEUROMA RESECTION Left 19990  . APPENDECTOMY  1976  . ATRIAL FIBRILLATION ABLATION N/A 12/19/2016   Procedure: ATRIAL FIBRILLATION ABLATION;  Surgeon: Thompson Grayer, MD;  Location: [facility];  Service: Cardiovascular;  Laterality: N/A;  . CARDIAC CATHETERIZATION N/A 10/07/2015   Procedure: Left Heart Cath and Coronary Angiography;  Surgeon: Josue Hector, MD;  Location: [facility];  Service: Cardiovascular;  Laterality: N/A;  . CARDIOVERSION N/A 06/17/2013   Procedure: CARDIOVERSION;  Surgeon: Josue Hector, MD;  Location: [facility];  Service: Cardiovascular;  Laterality: N/A;  . CARDIOVERSION N/A  05/11/2016   Procedure: CARDIOVERSION;  Surgeon: Sanda Klein, MD;  Location: [facility];  Service: Cardiovascular;  Laterality: N/A;  . CHOLECYSTECTOMY  1976  . CLUB FOOT RELEASE  1968  . ELECTROPHYSIOLOGIC STUDY N/A 11/30/2015   Procedure: Atrial Fibrillation Ablation;  Surgeon: Thompson Grayer, MD;  Location: [facility];  Service: Cardiovascular;  Laterality: N/A;  . TEE WITHOUT CARDIOVERSION N/A 05/11/2016   Procedure: TRANSESOPHAGEAL ECHOCARDIOGRAM (TEE);  Surgeon: Sanda Klein, MD;  Location: [facility];  Service: Cardiovascular;  Laterality: N/A;  . TONSILLECTOMY  1967  . TOTAL SHOULDER ARTHROPLASTY Left 06/15/2016   Procedure: TOTAL SHOULDER ARTHROPLASTY;  Surgeon: Tania Ade, MD;  Location: [facility];  Service: Orthopedics;  Laterality: Left;  Left total shoulder arthroplasty    Current Outpatient Medications  Medication Sig Dispense Refill  . amLODipine (NORVASC) 5 MG tablet Take 5 mg by mouth daily.     Marland Kitchen apixaban (ELIQUIS) 5 MG TABS tablet Take 1 tablet (5 mg total) by mouth 2 (two) times daily. 180 tablet 1  . atorvastatin (LIPITOR) 20 MG tablet Take 20 mg by mouth every evening.     . Cholecalciferol (VITAMIN D3 PO) Take 1 capsule by mouth daily.    . Coenzyme Q10 (COQ10) 200 MG CAPS Take 200 mg by mouth daily.    . fexofenadine (ALLEGRA) 180 MG tablet Take 180 mg by mouth daily as needed for allergies or rhinitis.    Marland Kitchen KLOR-CON M20 20 MEQ tablet Take 40 mEq by mouth every evening.     Marland Kitchen lisinopril-hydrochlorothiazide (PRINZIDE,ZESTORETIC) 20-25 MG per tablet Take 1 tablet by mouth daily.     . Magnesium 500 MG TABS Take 500 mg by mouth daily.    . metoprolol (LOPRESSOR) 50 MG tablet Take 50 mg by mouth 2 (two) times daily.     . naproxen sodium (ANAPROX) 220 MG tablet Take 220-440 mg by mouth 2 (two) times daily as needed (pain).    . Zinc 50 MG TABS Take 50 mg by mouth daily.    Marland Kitchen acetaminophen (TYLENOL) 500 MG tablet Take 1,000 mg by mouth every 6 (six) hours as  needed for moderate pain or headache.     No current facility-administered medications for this encounter.     Allergies  Allergen Reactions  . Penicillins Rash and Other (See Comments)    Has patient had a PCN reaction causing immediate rash, facial/tongue/throat swelling, SOB or lightheadedness with hypotension: No Has patient had a PCN reaction causing severe rash involving mucus membranes or skin necrosis: No Has patient had a PCN reaction that required hospitalization No Has patient had a PCN reaction occurring within the last 10 years: No If all of the above answers are "NO", then may proceed with Cephalosporin use.     Social History   Socioeconomic History  . Marital status: Married  Spouse name: Not on file  . Number of children: Not on file  . Years of education: Not on file  . Highest education level: Not on file  Occupational History  . Not on file  Social Needs  . Financial resource strain: Not on file  . Food insecurity:    Worry: Not on file    Inability: Not on file  . Transportation needs:    Medical: Not on file    Non-medical: Not on file  Tobacco Use  . Smoking status: Former Research scientist (life sciences)  . Smokeless tobacco: Never Used  Substance and Sexual Activity  . Alcohol use: Yes    Comment: previously 20 beers per week, now 2 beers per week  . Drug use: No  . Sexual activity: Not on file  Lifestyle  . Physical activity:    Days per week: Not on file    Minutes per session: Not on file  . Stress: Not on file  Relationships  . Social connections:    Talks on phone: Not on file    Gets together: Not on file    Attends religious service: Not on file    Active member of club or organization: Not on file    Attends meetings of clubs or organizations: Not on file    Relationship status: Not on file  . Intimate partner violence:    Fear of current or ex partner: Not on file    Emotionally abused: Not on file    Physically abused: Not on file    Forced sexual  activity: Not on file  Other Topics Concern  . Not on file  Social History Narrative   Lives in [place] wife.  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    Family History  Problem Relation Age of Onset  . Hypertension Father   . Hypertension Mother   . Stroke Mother   . CVA Mother   . Hypertension Brother   . Hypercholesterolemia Brother   . Atrial fibrillation Brother   . Congestive Heart Failure Brother     ROS- All systems are reviewed and negative except as per the HPI above  Physical Exam: Vitals:   05/22/17 1343  BP: 118/84  Pulse: 91  Weight: 226 lb (102.5 kg)  Height: 5\' 10"  (1.778 m)    GEN- The patient is well appearing, alert and oriented x 3 today.   Head- normocephalic, atraumatic Eyes-  Sclera clear, conjunctiva pink Ears- hearing intact Oropharynx- clear Neck- supple, no JVP Lymph- no cervical lymphadenopathy Lungs- Clear to ausculation bilaterally, normal work of breathing, few rales hears in lung bases Heart -irregular rate and rhythm, no murmurs, rubs or gallops, PMI not laterally displaced GI- soft, NT, ND, + BS Extremities- no clubbing, cyanosis, or edema MS- no significant deformity or atrophy Skin- no rash or lesion Psych- euthymic mood, full affect Neuro- strength and sensation are intact  EKG- atrial flutter with variable AV block, qrs int 88 ms, qtc 442 ms Epic records reviewed   Assessment and Plan: 1.  Paroxymal atrial flutter, s/p afib ablation 11/30/15 and 12/19/16 Out of rhythm x 2 weeks No  missed doses of eliquis for chadsvasc score of 1 Will plan for cardioversion Continue metoprolol 50 mg bid Consider having tremor evaluated by neurology if worsens Bmet/cbc today   R/u with afib clinic one week after cardioversion  F/u with Dr. Rayann Heman end of May as scheduled  Geroge Baseman. [facility] [place],  [place] [address] [phone]

## 2017-05-22 NOTE — Progress Notes (Signed)
Patient ID: Patrick Gray, male   DOB: Jul 14, 1955, 62 y.o.   MRN: [id number]     Primary Care Physician: [name], MD Referring Physician: [name] is a 62 y.o. male with a h/o afib 2015 found by primary. Was placed on flecainide 100 mg bid and has done well until seen 6/20 and was noted to be in aflutter with RVR.Because of breakthrough afib, he was scheduled for ablation 11/30/15.  F/u in afib clinic 3 13. He was pending shoulder surgery   and has been in afib x 2 weeks. Flecainide was decreased to 50 mg bid, as he was told he could try to wean off after cardioversion. and within several days he returned to afib.   After shoulder surgery, he did have afib for a couple of weeks and did return to [place] on his own.  He is in the afib clinic 11/15/16. He has returned to afib since the beginning of September. He had been trying to wean of flecainide  again. He has has some tremors and when he tries to wean off drug, the tremors get better. He is toying with the idea of having  another ablation vrs a cardioversion but has guests this week into next week and will wait until then to make the decision. He is tolerating well and is rate controlled. No missed doses of eliquis. He wishes to pursue another ablation.  F/u in fib clinic after 2nd ablation 12/19/16. He felt well until yesterday PM when he noted some wheezing, shortness of breath and could not lie flat last night to sleep. HR has been regular. No fever, chills, chest pain, cough. His weight is noted to be up 10 lbs from my last clinic visit around one month ago. He feels bloated in his abdomen.  F/u in afib clinic 12/26/16. He was started on lasix on last visit for fluid overload following ablation. He felt well after the first dose of lasix and by Saturday night( 2 doses), he was able to lie flat to sleep. He did not take lasix this am weight is down 8 lbs. Maintaining SR.  Pt is here for one month f/u ablation. He has not noted  any further afib. Weight is stable.Compliant with eliquis without any missed doses.  Pt being seen in clinic after calling to office, being out of rhythm x 2 weeks. He feels the trigger was waiting almost 2 weeks for test results for his wife, who has a h/o cancer. She got better news  than was expected but she is still pending surgery in the near future. Pt had stopped flecainide for worsening tremor of left arm, improved off flecainide. He took two doses of flecainide when he got out of rhythm and tremor worsened, so he says that he will no longer try flecainide. He states that he has never had tremor evaluated by neurologist, has mentioned to PCP.  Today, he denies symptoms of palpitations, chest pain, shortness of breath, orthopnea, PND, lower extremity edema, dizziness, presyncope, syncope, or neurologic sequela. Positive for fatigue in afib , hand tremor. The patient is tolerating medications without difficulties and is otherwise without complaint today.   Past Medical History:  Diagnosis Date  . Anxiety   . Deafness in left ear   . Dysrhythmia   . H/O cardiac radiofrequency ablation   . HTN (hypertension)   . Hypercholesterolemia   . Hypopotassemia   . Paroxysmal atrial fibrillation (HCC)   . Tremor  Past Surgical History:  Procedure Laterality Date  . ACOUSTIC NEUROMA RESECTION Left 19990  . APPENDECTOMY  1976  . ATRIAL FIBRILLATION ABLATION N/A 12/19/2016   Procedure: ATRIAL FIBRILLATION ABLATION;  Surgeon: Thompson Grayer, MD;  Location: [facility];  Service: Cardiovascular;  Laterality: N/A;  . CARDIAC CATHETERIZATION N/A 10/07/2015   Procedure: Left Heart Cath and Coronary Angiography;  Surgeon: Josue Hector, MD;  Location: [facility];  Service: Cardiovascular;  Laterality: N/A;  . CARDIOVERSION N/A 06/17/2013   Procedure: CARDIOVERSION;  Surgeon: Josue Hector, MD;  Location: [facility];  Service: Cardiovascular;  Laterality: N/A;  . CARDIOVERSION N/A  05/11/2016   Procedure: CARDIOVERSION;  Surgeon: Sanda Klein, MD;  Location: [facility];  Service: Cardiovascular;  Laterality: N/A;  . CHOLECYSTECTOMY  1976  . CLUB FOOT RELEASE  1968  . ELECTROPHYSIOLOGIC STUDY N/A 11/30/2015   Procedure: Atrial Fibrillation Ablation;  Surgeon: Thompson Grayer, MD;  Location: [facility];  Service: Cardiovascular;  Laterality: N/A;  . TEE WITHOUT CARDIOVERSION N/A 05/11/2016   Procedure: TRANSESOPHAGEAL ECHOCARDIOGRAM (TEE);  Surgeon: Sanda Klein, MD;  Location: [facility];  Service: Cardiovascular;  Laterality: N/A;  . TONSILLECTOMY  1967  . TOTAL SHOULDER ARTHROPLASTY Left 06/15/2016   Procedure: TOTAL SHOULDER ARTHROPLASTY;  Surgeon: Tania Ade, MD;  Location: [facility];  Service: Orthopedics;  Laterality: Left;  Left total shoulder arthroplasty    Current Outpatient Medications  Medication Sig Dispense Refill  . amLODipine (NORVASC) 5 MG tablet Take 5 mg by mouth daily.     Marland Kitchen apixaban (ELIQUIS) 5 MG TABS tablet Take 1 tablet (5 mg total) by mouth 2 (two) times daily. 180 tablet 1  . atorvastatin (LIPITOR) 20 MG tablet Take 20 mg by mouth every evening.     . Cholecalciferol (VITAMIN D3 PO) Take 1 capsule by mouth daily.    . Coenzyme Q10 (COQ10) 200 MG CAPS Take 200 mg by mouth daily.    . fexofenadine (ALLEGRA) 180 MG tablet Take 180 mg by mouth daily as needed for allergies or rhinitis.    Marland Kitchen KLOR-CON M20 20 MEQ tablet Take 40 mEq by mouth every evening.     Marland Kitchen lisinopril-hydrochlorothiazide (PRINZIDE,ZESTORETIC) 20-25 MG per tablet Take 1 tablet by mouth daily.     . Magnesium 500 MG TABS Take 500 mg by mouth daily.    . metoprolol (LOPRESSOR) 50 MG tablet Take 50 mg by mouth 2 (two) times daily.     . naproxen sodium (ANAPROX) 220 MG tablet Take 220-440 mg by mouth 2 (two) times daily as needed (pain).    . Zinc 50 MG TABS Take 50 mg by mouth daily.    Marland Kitchen acetaminophen (TYLENOL) 500 MG tablet Take 1,000 mg by mouth every 6 (six) hours as  needed for moderate pain or headache.     No current facility-administered medications for this encounter.     Allergies  Allergen Reactions  . Penicillins Rash and Other (See Comments)    Has patient had a PCN reaction causing immediate rash, facial/tongue/throat swelling, SOB or lightheadedness with hypotension: No Has patient had a PCN reaction causing severe rash involving mucus membranes or skin necrosis: No Has patient had a PCN reaction that required hospitalization No Has patient had a PCN reaction occurring within the last 10 years: No If all of the above answers are "NO", then may proceed with Cephalosporin use.     Social History   Socioeconomic History  . Marital status: Married  Spouse name: Not on file  . Number of children: Not on file  . Years of education: Not on file  . Highest education level: Not on file  Occupational History  . Not on file  Social Needs  . Financial resource strain: Not on file  . Food insecurity:    Worry: Not on file    Inability: Not on file  . Transportation needs:    Medical: Not on file    Non-medical: Not on file  Tobacco Use  . Smoking status: Former Research scientist (life sciences)  . Smokeless tobacco: Never Used  Substance and Sexual Activity  . Alcohol use: Yes    Comment: previously 20 beers per week, now 2 beers per week  . Drug use: No  . Sexual activity: Not on file  Lifestyle  . Physical activity:    Days per week: Not on file    Minutes per session: Not on file  . Stress: Not on file  Relationships  . Social connections:    Talks on phone: Not on file    Gets together: Not on file    Attends religious service: Not on file    Active member of club or organization: Not on file    Attends meetings of clubs or organizations: Not on file    Relationship status: Not on file  . Intimate partner violence:    Fear of current or ex partner: Not on file    Emotionally abused: Not on file    Physically abused: Not on file    Forced sexual  activity: Not on file  Other Topics Concern  . Not on file  Social History Narrative   Lives in [place] wife.  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    Family History  Problem Relation Age of Onset  . Hypertension Father   . Hypertension Mother   . Stroke Mother   . CVA Mother   . Hypertension Brother   . Hypercholesterolemia Brother   . Atrial fibrillation Brother   . Congestive Heart Failure Brother     ROS- All systems are reviewed and negative except as per the HPI above  Physical Exam: Vitals:   05/22/17 1343  BP: 118/84  Pulse: 91  Weight: 226 lb (102.5 kg)  Height: 5\' 10"  (1.778 m)    GEN- The patient is well appearing, alert and oriented x 3 today.   Head- normocephalic, atraumatic Eyes-  Sclera clear, conjunctiva pink Ears- hearing intact Oropharynx- clear Neck- supple, no JVP Lymph- no cervical lymphadenopathy Lungs- Clear to ausculation bilaterally, normal work of breathing, few rales hears in lung bases Heart -irregular rate and rhythm, no murmurs, rubs or gallops, PMI not laterally displaced GI- soft, NT, ND, + BS Extremities- no clubbing, cyanosis, or edema MS- no significant deformity or atrophy Skin- no rash or lesion Psych- euthymic mood, full affect Neuro- strength and sensation are intact  EKG- atrial flutter with variable AV block, qrs int 88 ms, qtc 442 ms Epic records reviewed   Assessment and Plan: 1.  Paroxymal atrial flutter, s/p afib ablation 11/30/15 and 12/19/16 Out of rhythm x 2 weeks No  missed doses of eliquis for chadsvasc score of 1 Will plan for cardioversion Continue metoprolol 50 mg bid Consider having tremor evaluated by neurology if worsens Bmet/cbc today   R/u with afib clinic one week after cardioversion  F/u with Dr. Rayann Heman end of May as scheduled  Geroge Baseman. [facility] [place],  [place] [address] [phone]

## 2017-05-22 NOTE — Patient Instructions (Signed)
Cardioversion scheduled for Thursday, April 4th  - Arrive at the [facility] and go to admitting at [facility] not eat or drink anything after midnight the night prior to your procedure.  - Take all your medication with a sip of water prior to arrival.  - You will not be able to drive home after your procedure.

## 2017-05-24 ENCOUNTER — Encounter (HOSPITAL_COMMUNITY)
Admission: RE | Disposition: A | Discharge status: Home / Self Care | Payer: Self-pay | Source: Ambulatory Visit | Attending: Cardiology

## 2017-05-24 ENCOUNTER — Ambulatory Visit (HOSPITAL_COMMUNITY): Payer: BC Managed Care – PPO | Admitting: Certified Registered Nurse Anesthetist

## 2017-05-24 ENCOUNTER — Encounter (HOSPITAL_COMMUNITY): Payer: Self-pay | Admitting: Emergency Medicine

## 2017-05-24 ENCOUNTER — Other Ambulatory Visit: Payer: Self-pay

## 2017-05-24 ENCOUNTER — Ambulatory Visit (HOSPITAL_COMMUNITY)
Admission: RE | Admit: 2017-05-24 | Discharge: 2017-05-24 | Disposition: A | Discharge status: Home / Self Care | Payer: BC Managed Care – PPO | Source: Ambulatory Visit | Attending: Cardiology | Admitting: Cardiology

## 2017-05-24 DIAGNOSIS — H9192 Unspecified hearing loss, left ear: Secondary | ICD-10-CM | POA: Insufficient documentation

## 2017-05-24 DIAGNOSIS — I10 Essential (primary) hypertension: Secondary | ICD-10-CM | POA: Diagnosis not present

## 2017-05-24 DIAGNOSIS — I481 Persistent atrial fibrillation: Secondary | ICD-10-CM | POA: Diagnosis not present

## 2017-05-24 DIAGNOSIS — I480 Paroxysmal atrial fibrillation: Secondary | ICD-10-CM | POA: Insufficient documentation

## 2017-05-24 DIAGNOSIS — Z7901 Long term (current) use of anticoagulants: Secondary | ICD-10-CM | POA: Diagnosis not present

## 2017-05-24 DIAGNOSIS — Z79899 Other long term (current) drug therapy: Secondary | ICD-10-CM | POA: Insufficient documentation

## 2017-05-24 DIAGNOSIS — E7800 Pure hypercholesterolemia, unspecified: Secondary | ICD-10-CM | POA: Diagnosis not present

## 2017-05-24 DIAGNOSIS — I4892 Unspecified atrial flutter: Secondary | ICD-10-CM | POA: Diagnosis not present

## 2017-05-24 DIAGNOSIS — Z87891 Personal history of nicotine dependence: Secondary | ICD-10-CM | POA: Insufficient documentation

## 2017-05-24 HISTORY — PX: CARDIOVERSION: SHX1299

## 2017-05-24 SURGERY — CARDIOVERSION
Anesthesia: General

## 2017-05-24 MED ORDER — LIDOCAINE 2% (20 MG/ML) 5 ML SYRINGE
INTRAMUSCULAR | Status: DC | PRN
  Administered 2017-05-24: 80 mg via INTRAVENOUS

## 2017-05-24 MED ORDER — PROPOFOL 10 MG/ML IV BOLUS
INTRAVENOUS | Status: DC | PRN
  Administered 2017-05-24: 100 mg via INTRAVENOUS

## 2017-05-24 NOTE — Interval H&P Note (Signed)
History and Physical Interval Note:  05/24/2017 8:02 AM  Patrick Gray  has presented today for surgery, with the diagnosis of afib  The various methods of treatment have been discussed with the patient and family. After consideration of risks, benefits and other options for treatment, the patient has consented to  Procedure(s): CARDIOVERSION (N/A) as a surgical intervention .  The patient's history has been reviewed, patient examined, no change in status, stable for surgery.  I have reviewed the patient's chart and labs.  Questions were answered to the patient's satisfaction.     [name]

## 2017-05-24 NOTE — Anesthesia Preprocedure Evaluation (Addendum)
Anesthesia Evaluation  Patient identified by MRN, date of birth, ID band Patient awake    Reviewed: Allergy & Precautions, NPO status , Patient's Chart, lab work & pertinent test results, reviewed documented beta blocker date and time   Airway Mallampati: II  TM Distance: >3 FB Neck ROM: Full    Dental  (+) Teeth Intact   Pulmonary former smoker,    breath sounds clear to auscultation       Cardiovascular hypertension, Pt. on medications and Pt. on home beta blockers + dysrhythmias Atrial Fibrillation  Rhythm:Regular Rate:Normal  '18 TTE - Mild LVH. EF 55% to 60%. Mild MR, mildly dilated LA.   Neuro/Psych Anxiety Left ear deafness    GI/Hepatic negative GI ROS, Neg liver ROS,   Endo/Other  negative endocrine ROSObesity  Renal/GU negative Renal ROS  negative genitourinary   Musculoskeletal negative musculoskeletal ROS (+)   Abdominal   Peds  Hematology negative hematology ROS (+)   Anesthesia Other Findings   Reproductive/Obstetrics                            Anesthesia Physical  Anesthesia Plan  ASA: III  Anesthesia Plan: General   Post-op Pain Management:    Induction: Intravenous  PONV Risk Score and Plan: 2 and Treatment may vary due to age or medical condition  Airway Management Planned: Natural Airway and Mask  Additional Equipment: None  Intra-op Plan:   Post-operative Plan:   Informed Consent: I have reviewed the patients History and Physical, chart, labs and discussed the procedure including the risks, benefits and alternatives for the proposed anesthesia with the patient or authorized representative who has indicated his/her understanding and acceptance.     Plan Discussed with: CRNA and Anesthesiologist  Anesthesia Plan Comments:         Anesthesia Quick Evaluation

## 2017-05-24 NOTE — Discharge Instructions (Signed)
Electrical Cardioversion, Care After °This sheet gives you information about how to care for yourself after your procedure. Your health care provider may also give you more specific instructions. If you have problems or questions, contact your health care provider. °What can I expect after the procedure? °After the procedure, it is common to have: °· Some redness on the skin where the shocks were given. ° °Follow these instructions at home: °· Do not drive for 24 hours if you were given a medicine to help you relax (sedative). °· Take over-the-counter and prescription medicines only as told by your health care provider. °· Ask your health care provider how to check your pulse. Check it often. °· Rest for 48 hours after the procedure or as told by your health care provider. °· Avoid or limit your caffeine use as told by your health care provider. °Contact a health care provider if: °· You feel like your heart is beating too quickly or your pulse is not regular. °· You have a serious muscle cramp that does not go away. °Get help right away if: °· You have discomfort in your chest. °· You are dizzy or you feel faint. °· You have trouble breathing or you are short of breath. °· Your speech is slurred. °· You have trouble moving an arm or leg on one side of your body. °· Your fingers or toes turn cold or blue. °This information is not intended to replace advice given to you by your health care provider. Make sure you discuss any questions you have with your health care provider. °Document Released: 11/27/2012 Document Revised: 09/10/2015 Document Reviewed: 08/13/2015 °Elsevier Interactive Patient Education © 2018 [employer] ° °

## 2017-05-24 NOTE — Transfer of Care (Signed)
Immediate Anesthesia Transfer of Care Note  Patient: Patrick Gray  Procedure(s) Performed: CARDIOVERSION (N/A )  Patient Location: Endoscopy Unit  Anesthesia Type:General  Level of Consciousness: drowsy  Airway & Oxygen Therapy: Patient Spontanous Breathing  Post-op Assessment: Report given to RN and Post -op Vital signs reviewed and stable  Post vital signs: Reviewed and stable  Last Vitals:  Vitals Value Taken Time  BP    Temp    Pulse 68 05/24/2017  8:30 AM  Resp 14 05/24/2017  8:30 AM  SpO2 94 % 05/24/2017  8:30 AM  Vitals shown include unvalidated device data.  Last Pain:  Vitals:   05/24/17 6811  TempSrc: Oral  PainSc: 0-No pain         Complications: No apparent anesthesia complications

## 2017-05-24 NOTE — CV Procedure (Signed)
   Electrical Cardioversion Procedure Note Patrick Gray [id number] 1955/12/06  Procedure: Electrical Cardioversion Indications:  Atrial Flutter  Time Out: Verified patient identification, verified procedure,medications/allergies/relevent history reviewed, required imaging and test results available.  Performed  Procedure Details  The patient was NPO after midnight. Anesthesia was administered at the beside  by [facility] with 100mg  of propofol and Lidocaine 80mg .  Cardioversion was done with synchronized biphasic defibrillation with AP pads with 150watts.  The patient converted to normal sinus rhythm. The patient tolerated the procedure well   IMPRESSION:  Successful cardioversion of atrial flutter    Patrick Gray 05/24/2017, 8:02 AM

## 2017-05-24 NOTE — Anesthesia Postprocedure Evaluation (Signed)
Anesthesia Post Note  Patient: Patrick Gray  Procedure(s) Performed: CARDIOVERSION (N/A )     Patient location during evaluation: PACU Anesthesia Type: General Level of consciousness: awake and alert Pain management: pain level controlled Vital Signs Assessment: post-procedure vital signs reviewed and stable Respiratory status: spontaneous breathing, nonlabored ventilation and respiratory function stable Cardiovascular status: blood pressure returned to baseline and stable Postop Assessment: no apparent nausea or vomiting Anesthetic complications: no    Last Vitals:  Vitals:   05/24/17 0830 05/24/17 0845  BP: (!) 118/92 114/82  Pulse:  77  Resp: 14 15  Temp: 36.7 C   SpO2: 94% 96%    Last Pain:  Vitals:   05/24/17 0845  TempSrc:   PainSc: 0-No pain                 Audry Pili

## 2017-05-25 ENCOUNTER — Encounter (HOSPITAL_COMMUNITY): Payer: Self-pay | Admitting: Cardiology

## 2017-06-05 ENCOUNTER — Ambulatory Visit (HOSPITAL_COMMUNITY)
Admission: RE | Admit: 2017-06-05 | Discharge: 2017-06-05 | Disposition: A | Discharge status: Home / Self Care | Payer: BC Managed Care – PPO | Source: Ambulatory Visit | Attending: Nurse Practitioner | Admitting: Nurse Practitioner

## 2017-06-05 ENCOUNTER — Encounter (HOSPITAL_COMMUNITY): Payer: Self-pay | Admitting: Nurse Practitioner

## 2017-06-05 VITALS — BP 142/98 | HR 76 | Ht 70.0 in | Wt 227.0 lb

## 2017-06-05 DIAGNOSIS — I480 Paroxysmal atrial fibrillation: Secondary | ICD-10-CM | POA: Insufficient documentation

## 2017-06-05 DIAGNOSIS — I4891 Unspecified atrial fibrillation: Secondary | ICD-10-CM | POA: Diagnosis present

## 2017-06-05 DIAGNOSIS — Z79899 Other long term (current) drug therapy: Secondary | ICD-10-CM | POA: Insufficient documentation

## 2017-06-05 DIAGNOSIS — Z823 Family history of stroke: Secondary | ICD-10-CM | POA: Insufficient documentation

## 2017-06-05 DIAGNOSIS — Z9889 Other specified postprocedural states: Secondary | ICD-10-CM | POA: Insufficient documentation

## 2017-06-05 DIAGNOSIS — H9192 Unspecified hearing loss, left ear: Secondary | ICD-10-CM | POA: Insufficient documentation

## 2017-06-05 DIAGNOSIS — I4892 Unspecified atrial flutter: Secondary | ICD-10-CM | POA: Diagnosis not present

## 2017-06-05 DIAGNOSIS — E7800 Pure hypercholesterolemia, unspecified: Secondary | ICD-10-CM | POA: Insufficient documentation

## 2017-06-05 DIAGNOSIS — I10 Essential (primary) hypertension: Secondary | ICD-10-CM | POA: Diagnosis not present

## 2017-06-05 DIAGNOSIS — Z880 Allergy status to penicillin: Secondary | ICD-10-CM | POA: Diagnosis not present

## 2017-06-05 DIAGNOSIS — F419 Anxiety disorder, unspecified: Secondary | ICD-10-CM | POA: Insufficient documentation

## 2017-06-05 DIAGNOSIS — Z7901 Long term (current) use of anticoagulants: Secondary | ICD-10-CM | POA: Diagnosis not present

## 2017-06-05 DIAGNOSIS — R9431 Abnormal electrocardiogram [ECG] [EKG]: Secondary | ICD-10-CM | POA: Diagnosis not present

## 2017-06-05 DIAGNOSIS — Z87891 Personal history of nicotine dependence: Secondary | ICD-10-CM | POA: Insufficient documentation

## 2017-06-05 NOTE — Progress Notes (Addendum)
Patient ID: Patrick Gray, male   DOB: 23-May-1955, 62 y.o.   MRN: [id number]     Primary Care Physician: [name], MD Referring Physician: Virgel Haro is a 62 y.o. male with a h/o afib 2015 found by primary. Was placed on flecainide 100 mg bid and has done well until seen 6/20 and was noted to be in aflutter with RVR.Because of breakthrough afib, he was scheduled for ablation 11/30/15.  F/u in afib clinic 3 13. He was pending shoulder surgery   and has been in afib x 2 weeks. Flecainide was decreased to 50 mg bid, as he was told he could try to wean off after cardioversion. and within several days he returned to afib.   After shoulder surgery, he did have afib for a couple of weeks and did return to [place] on his own.  He is in the afib clinic 11/15/16. He has returned to afib since the beginning of September. He had been trying to wean of flecainide  again. He has has some tremors and when he tries to wean off drug, the tremors get better. He is toying with the idea of having  another ablation vrs a cardioversion but has guests this week into next week and will wait until then to make the decision. He is tolerating well and is rate controlled. No missed doses of eliquis. He wishes to pursue another ablation.  F/u in fib clinic after 2nd ablation 12/19/16. He felt well until yesterday PM when he noted some wheezing, shortness of breath and could not lie flat last night to sleep. HR has been regular. No fever, chills, chest pain, cough. His weight is noted to be up 10 lbs from my last clinic visit around one month ago. He feels bloated in his abdomen.  F/u in afib clinic 12/26/16. He was started on lasix on last visit for fluid overload following ablation. He felt well after the first dose of lasix and by Saturday night( 2 doses), he was able to lie flat to sleep. He did not take lasix this am weight is down 8 lbs. Maintaining SR.  Pt is here for one month f/u ablation. He has not noted  any further afib. Weight is stable.Compliant with eliquis without any missed doses.  Pt being seen in clinic after calling to office, being out of rhythm x 2 weeks. He feels the trigger was waiting almost 2 weeks for test results for his wife, who has a h/o cancer. She got better news  than was expected but she is still pending surgery in the near future. Pt had stopped flecainide for worsening tremor of left arm, improved off flecainide. He took two doses of flecainide when he got out of rhythm and tremor worsened, so he says that he will no longer try flecainide. He states that he has never had tremor evaluated by neurologist, has mentioned to PCP.  F/u in afib clinic, 4/16. S/p successful cardioversion. He feels well. EKG shows SR.   Today, he denies symptoms of palpitations, chest pain, shortness of breath, orthopnea, PND, lower extremity edema, dizziness, presyncope, syncope, or neurologic sequela. Positive for fatigue in afib , hand tremor. The patient is tolerating medications without difficulties and is otherwise without complaint today.   Past Medical History:  Diagnosis Date  . Anxiety   . Deafness in left ear   . Dysrhythmia   . H/O cardiac radiofrequency ablation   . HTN (hypertension)   . Hypercholesterolemia   .  Hypopotassemia   . Paroxysmal atrial fibrillation (HCC)   . Tremor    Past Surgical History:  Procedure Laterality Date  . ACOUSTIC NEUROMA RESECTION Left 19990  . APPENDECTOMY  1976  . ATRIAL FIBRILLATION ABLATION N/A 12/19/2016   Procedure: ATRIAL FIBRILLATION ABLATION;  Surgeon: Thompson Grayer, MD;  Location: [place] CV LAB;  Service: Cardiovascular;  Laterality: N/A;  . CARDIAC CATHETERIZATION N/A 10/07/2015   Procedure: Left Heart Cath and Coronary Angiography;  Surgeon: Josue Hector, MD;  Location: [place] CV LAB;  Service: Cardiovascular;  Laterality: N/A;  . CARDIOVERSION N/A 06/17/2013   Procedure: CARDIOVERSION;  Surgeon: Josue Hector, MD;   Location: [place];  Service: Cardiovascular;  Laterality: N/A;  . CARDIOVERSION N/A 05/11/2016   Procedure: CARDIOVERSION;  Surgeon: Sanda Klein, MD;  Location: [facility] ENDOSCOPY;  Service: Cardiovascular;  Laterality: N/A;  . CARDIOVERSION N/A 05/24/2017   Procedure: CARDIOVERSION;  Surgeon: Sueanne Margarita, MD;  Location: [facility] ENDOSCOPY;  Service: Cardiovascular;  Laterality: N/A;  . CHOLECYSTECTOMY  1976  . CLUB FOOT RELEASE  1968  . ELECTROPHYSIOLOGIC STUDY N/A 11/30/2015   Procedure: Atrial Fibrillation Ablation;  Surgeon: Thompson Grayer, MD;  Location: [place] CV LAB;  Service: Cardiovascular;  Laterality: N/A;  . TEE WITHOUT CARDIOVERSION N/A 05/11/2016   Procedure: TRANSESOPHAGEAL ECHOCARDIOGRAM (TEE);  Surgeon: Sanda Klein, MD;  Location: [place];  Service: Cardiovascular;  Laterality: N/A;  . TONSILLECTOMY  1967  . TOTAL SHOULDER ARTHROPLASTY Left 06/15/2016   Procedure: TOTAL SHOULDER ARTHROPLASTY;  Surgeon: Tania Ade, MD;  Location: [place];  Service: Orthopedics;  Laterality: Left;  Left total shoulder arthroplasty    Current Outpatient Medications  Medication Sig Dispense Refill  . acetaminophen (TYLENOL) 500 MG tablet Take 1,000 mg by mouth every 6 (six) hours as needed for moderate pain or headache.    Marland Kitchen amLODipine (NORVASC) 5 MG tablet Take 5 mg by mouth daily.     Marland Kitchen apixaban (ELIQUIS) 5 MG TABS tablet Take 1 tablet (5 mg total) by mouth 2 (two) times daily. 180 tablet 1  . atorvastatin (LIPITOR) 20 MG tablet Take 20 mg by mouth every evening.     . Cholecalciferol (VITAMIN D3 PO) Take 1 capsule by mouth daily.    . Coenzyme Q10 (COQ10) 200 MG CAPS Take 200 mg by mouth daily.    . fexofenadine (ALLEGRA) 180 MG tablet Take 180 mg by mouth daily as needed for allergies or rhinitis.    Marland Kitchen KLOR-CON M20 20 MEQ tablet Take 40 mEq by mouth every evening.     Marland Kitchen lisinopril-hydrochlorothiazide (PRINZIDE,ZESTORETIC) 20-25 MG per tablet Take 1 tablet by mouth daily.     .  Magnesium 500 MG TABS Take 500 mg by mouth daily.    . metoprolol (LOPRESSOR) 50 MG tablet Take 50 mg by mouth 2 (two) times daily.     . naproxen sodium (ANAPROX) 220 MG tablet Take 220-440 mg by mouth 2 (two) times daily as needed (pain).    . Zinc 50 MG TABS Take 50 mg by mouth daily.     No current facility-administered medications for this encounter.     Allergies  Allergen Reactions  . Penicillins Rash and Other (See Comments)    Has patient had a PCN reaction causing immediate rash, facial/tongue/throat swelling, SOB or lightheadedness with hypotension: No Has patient had a PCN reaction causing severe rash involving mucus membranes or skin necrosis: No Has patient had a PCN reaction that required hospitalization No Has patient had  a PCN reaction occurring within the last 10 years: No If all of the above answers are "NO", then may proceed with Cephalosporin use.     Social History   Socioeconomic History  . Marital status: Married    Spouse name: Not on file  . Number of children: Not on file  . Years of education: Not on file  . Highest education level: Not on file  Occupational History  . Not on file  Social Needs  . Financial resource strain: Not on file  . Food insecurity:    Worry: Not on file    Inability: Not on file  . Transportation needs:    Medical: Not on file    Non-medical: Not on file  Tobacco Use  . Smoking status: Former Research scientist (life sciences)  . Smokeless tobacco: Never Used  Substance and Sexual Activity  . Alcohol use: Yes    Comment: previously 20 beers per week, now 2 beers per week  . Drug use: No  . Sexual activity: Not on file  Lifestyle  . Physical activity:    Days per week: Not on file    Minutes per session: Not on file  . Stress: Not on file  Relationships  . Social connections:    Talks on phone: Not on file    Gets together: Not on file    Attends religious service: Not on file    Active member of club or organization: Not on file     Attends meetings of clubs or organizations: Not on file    Relationship status: Not on file  . Intimate partner violence:    Fear of current or ex partner: Not on file    Emotionally abused: Not on file    Physically abused: Not on file    Forced sexual activity: Not on file  Other Topics Concern  . Not on file  Social History Narrative   Lives in [place] wife.  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    Family History  Problem Relation Age of Onset  . Hypertension Father   . Hypertension Mother   . Stroke Mother   . CVA Mother   . Hypertension Brother   . Hypercholesterolemia Brother   . Atrial fibrillation Brother   . Congestive Heart Failure Brother     ROS- All systems are reviewed and negative except as per the HPI above  Physical Exam: Vitals:   06/05/17 1351  BP: (!) 142/98  Pulse: 76  Weight: 227 lb (103 kg)  Height: 5\' 10"  (1.778 m)    GEN- The patient is well appearing, alert and oriented x 3 today.   Head- normocephalic, atraumatic Eyes-  Sclera clear, conjunctiva pink Ears- hearing intact Oropharynx- clear Neck- supple, no JVP Lymph- no cervical lymphadenopathy Lungs- Clear to ausculation bilaterally, normal work of breathing, few rales hears in lung bases Heart-regular rate and rhythm, no murmurs, rubs or gallops, PMI not laterally displaced GI- soft, NT, ND, + BS Extremities- no clubbing, cyanosis, or edema MS- no significant deformity or atrophy Skin- no rash or lesion Psych- euthymic mood, full affect Neuro- strength and sensation are intact  EKG- NSR at 76 bpm, PR int 190 ms, qrs int 90 ms, qtc 441 ms Epic records reviewed   Assessment and Plan: 1.  Paroxymal atrial flutter, s/p afib ablation 11/30/15 and 12/19/16 Out of rhythm x 2 weeks, thinks trigger was concerning  health news re wife Now with successful cardioversion Continue eliquis for chadsvasc score of 1 Continue  metoprolol 50 mg bid Consider having tremor evaluated by  neurology if worsens Bmet/cbc today   F/u with Dr. Rayann Heman end of May as scheduled  Geroge Baseman. [place], [facility] [address] [place], [place] [address] [phone]

## 2017-06-07 NOTE — Addendum Note (Signed)
Encounter addended by: Sherran Needs, NP on: 06/07/2017 11:36 AM  Actions taken: LOS modified

## 2017-07-02 ENCOUNTER — Ambulatory Visit: Payer: BC Managed Care – PPO | Admitting: Internal Medicine

## 2017-07-18 ENCOUNTER — Encounter: Payer: Self-pay | Admitting: Internal Medicine

## 2017-07-18 ENCOUNTER — Ambulatory Visit: Payer: BC Managed Care – PPO | Admitting: Internal Medicine

## 2017-07-18 VITALS — BP 126/82 | HR 70 | Ht 70.0 in | Wt 225.0 lb

## 2017-07-18 DIAGNOSIS — I480 Paroxysmal atrial fibrillation: Secondary | ICD-10-CM

## 2017-07-18 DIAGNOSIS — I10 Essential (primary) hypertension: Secondary | ICD-10-CM | POA: Diagnosis not present

## 2017-07-18 NOTE — Patient Instructions (Signed)
Medication Instructions:  Your physician recommends that you continue on your current medications as directed. Please refer to the Current Medication list given to you today.  Labwork: None ordered  Testing/Procedures: None ordered  Follow-Up: Your physician wants you to follow-up in: 6 months with Dr. Rayann Heman.  You will receive a reminder letter in the mail two months in advance. If you don't receive a letter, please call our office to schedule the follow-up appointment.  * If you need a refill on your cardiac medications before your next appointment, please call your pharmacy.   *Please note that any paperwork needing to be filled out by the provider will need to be addressed at the front desk prior to seeing the provider. Please note that any FMLA, disability or other documents regarding health condition is subject to a $25.00 charge that must be received prior to completion of paperwork in the form of a money order or check.  Thank you for choosing [facility]!!    Any Other Special Instructions Will Be Listed Below (If Applicable). AFib clinic = Roderic Palau, NP  [phone]

## 2017-07-18 NOTE — Progress Notes (Signed)
PCP: [name], MD Primary Cardiologist: Dr Johnsie Cancel Primary EP: Dr Marijo Conception is a 62 y.o. male who presents today for routine electrophysiology followup.  Since last being seen in our clinic, the patient reports doing very well.  He did have afib in April for which he required cardioversion.  He has done well since.  Overall, he is pleased with his rhythm control.  Today, he denies symptoms of palpitations, chest pain, shortness of breath,  lower extremity edema, dizziness, presyncope, or syncope.  The patient is otherwise without complaint today.   Past Medical History:  Diagnosis Date  . Anxiety   . Deafness in left ear   . Dysrhythmia   . H/O cardiac radiofrequency ablation   . HTN (hypertension)   . Hypercholesterolemia   . Hypopotassemia   . Paroxysmal atrial fibrillation (HCC)   . Tremor    Past Surgical History:  Procedure Laterality Date  . ACOUSTIC NEUROMA RESECTION Left 19990  . APPENDECTOMY  1976  . ATRIAL FIBRILLATION ABLATION N/A 12/19/2016   Procedure: ATRIAL FIBRILLATION ABLATION;  Surgeon: Thompson Grayer, MD;  Location: [place] CV LAB;  Service: Cardiovascular;  Laterality: N/A;  . CARDIAC CATHETERIZATION N/A 10/07/2015   Procedure: Left Heart Cath and Coronary Angiography;  Surgeon: Josue Hector, MD;  Location: [place] CV LAB;  Service: Cardiovascular;  Laterality: N/A;  . CARDIOVERSION N/A 06/17/2013   Procedure: CARDIOVERSION;  Surgeon: Josue Hector, MD;  Location: [place];  Service: Cardiovascular;  Laterality: N/A;  . CARDIOVERSION N/A 05/11/2016   Procedure: CARDIOVERSION;  Surgeon: Sanda Klein, MD;  Location: [facility] ENDOSCOPY;  Service: Cardiovascular;  Laterality: N/A;  . CARDIOVERSION N/A 05/24/2017   Procedure: CARDIOVERSION;  Surgeon: Sueanne Margarita, MD;  Location: [facility] ENDOSCOPY;  Service: Cardiovascular;  Laterality: N/A;  . CHOLECYSTECTOMY  1976  . CLUB FOOT RELEASE  1968  . ELECTROPHYSIOLOGIC STUDY N/A 11/30/2015   Procedure: Atrial Fibrillation Ablation;  Surgeon: Thompson Grayer, MD;  Location: [place] CV LAB;  Service: Cardiovascular;  Laterality: N/A;  . TEE WITHOUT CARDIOVERSION N/A 05/11/2016   Procedure: TRANSESOPHAGEAL ECHOCARDIOGRAM (TEE);  Surgeon: Sanda Klein, MD;  Location: [place];  Service: Cardiovascular;  Laterality: N/A;  . TONSILLECTOMY  1967  . TOTAL SHOULDER ARTHROPLASTY Left 06/15/2016   Procedure: TOTAL SHOULDER ARTHROPLASTY;  Surgeon: Tania Ade, MD;  Location: [place];  Service: Orthopedics;  Laterality: Left;  Left total shoulder arthroplasty    ROS- all systems are reviewed and negatives except as per HPI above  Current Outpatient Medications  Medication Sig Dispense Refill  . acetaminophen (TYLENOL) 500 MG tablet Take 1,000 mg by mouth every 6 (six) hours as needed for moderate pain or headache.    Marland Kitchen amLODipine (NORVASC) 5 MG tablet Take 5 mg by mouth daily.     Marland Kitchen apixaban (ELIQUIS) 5 MG TABS tablet Take 1 tablet (5 mg total) by mouth 2 (two) times daily. 180 tablet 1  . atorvastatin (LIPITOR) 20 MG tablet Take 20 mg by mouth every evening.     . Cholecalciferol (VITAMIN D3 PO) Take 1 capsule by mouth daily.    . Coenzyme Q10 (COQ10) 200 MG CAPS Take 200 mg by mouth daily.    . fexofenadine (ALLEGRA) 180 MG tablet Take 180 mg by mouth daily as needed for allergies or rhinitis.    Marland Kitchen KLOR-CON M20 20 MEQ tablet Take 40 mEq by mouth every evening.     Marland Kitchen lisinopril-hydrochlorothiazide (PRINZIDE,ZESTORETIC) 20-25 MG per tablet Take 1  tablet by mouth daily.     . Magnesium 500 MG TABS Take 500 mg by mouth daily.    . metoprolol (LOPRESSOR) 50 MG tablet Take 50 mg by mouth 2 (two) times daily.     . naproxen sodium (ANAPROX) 220 MG tablet Take 220-440 mg by mouth 2 (two) times daily as needed (pain).    . Zinc 50 MG TABS Take 50 mg by mouth daily.     No current facility-administered medications for this visit.     Physical Exam: Vitals:   07/18/17 1350  BP: 126/82    Pulse: 70  Weight: 225 lb (102.1 kg)  Height: 5\' 10"  (1.778 m)    GEN- The patient is well appearing, alert and oriented x 3 today.   Head- normocephalic, atraumatic Eyes-  Sclera clear, conjunctiva pink Ears- hearing intact Oropharynx- clear Lungs- Clear to ausculation bilaterally, normal work of breathing Heart- Regular rate and rhythm, no murmurs, rubs or gallops, PMI not laterally displaced GI- soft, NT, ND, + BS Extremities- no clubbing, cyanosis, or edema Neuro- L eye droop, stable.  Resting tremor noted.  Wt Readings from Last 3 Encounters:  07/18/17 225 lb (102.1 kg)  06/05/17 227 lb (103 kg)  05/24/17 226 lb (102.5 kg)    EKG tracing ordered today is personally reviewed and shows sinus rhythm 70 bpm, PR 180 msec, QRS 86 msec, Qtc 438 msec  Assessment and Plan:  1. Paroxysmal atrial fibrillation Doing well s/p ablation off AAD therapy He did have a single episode of afib in April (EKG reviewed) for which he was cardioverted He is overall doing well and pleased with his rhythm control chads2vasc score is 1.  He prefers to remain on anticoagulation His tremor improved off of flecainide.  I would not advise that he take this in the future.  2. HTN Stable No change required today  3. Tachycardia mediated CM Resolved with sinus rhythm  Return to see me in 6 months  Thompson Grayer MD, [facility] 07/18/2017 2:10 PM

## 2017-10-22 ENCOUNTER — Other Ambulatory Visit: Payer: Self-pay | Admitting: Internal Medicine

## 2017-10-23 NOTE — Telephone Encounter (Signed)
Eliquis 5mg  refill request received; pt is 62 yrs old, wt-102.1kg, Crea-0.83 on 05/22/17, last seen by Dr. Rayann Heman on 07/18/17; will send in refill to requested pharmacy.

## 2017-10-29 ENCOUNTER — Telehealth (HOSPITAL_COMMUNITY): Payer: Self-pay | Admitting: *Deleted

## 2017-10-29 MED ORDER — METOPROLOL SUCCINATE ER 50 MG PO TB24: 50.0000 mg | ORAL_TABLET | Freq: Two times a day (BID) | ORAL | 3 refills | Status: DC

## 2017-10-29 NOTE — Telephone Encounter (Signed)
Patient called in stating over the last couple of weeks he has noticed his HR getting up a few hours before his next dose of metoprolol right around 100. As soon as he takes his dose of metoprolol it settles down after about a hour. He doesn't feel like hes in AF. Yesterday he decided to take his metoprolol 2 hours early and he did not have elevated HR but it was elevated before his next scheduled dose of metoprolol. Discussed with Roderic Palau NP will switch to metoprolol succinate 50mg  BID. Pt will call if any issues.

## 2017-11-02 ENCOUNTER — Telehealth (HOSPITAL_COMMUNITY): Payer: Self-pay | Admitting: *Deleted

## 2017-11-02 MED ORDER — METOPROLOL TARTRATE 50 MG PO TABS: 75.0000 mg | ORAL_TABLET | Freq: Two times a day (BID) | ORAL | 3 refills | Status: DC

## 2017-11-02 NOTE — Telephone Encounter (Signed)
Pt called in stating since changing his formulation of metoprolol he has actually had more elevated HRs. Some days lasting most of the day around 100. Pt denies feeling as though he is in AF. Will come in for EKG next week. For now Roderic Palau NP recommends stopping toprol and resuming lopressor at 75mg  BID. Pt in agreement.

## 2017-11-07 ENCOUNTER — Encounter (HOSPITAL_COMMUNITY): Payer: Self-pay | Admitting: Nurse Practitioner

## 2017-11-07 ENCOUNTER — Ambulatory Visit (HOSPITAL_COMMUNITY)
Admission: RE | Admit: 2017-11-07 | Discharge: 2017-11-07 | Disposition: A | Discharge status: Home / Self Care | Payer: BC Managed Care – PPO | Source: Ambulatory Visit | Attending: Nurse Practitioner | Admitting: Nurse Practitioner

## 2017-11-07 ENCOUNTER — Other Ambulatory Visit (HOSPITAL_COMMUNITY): Payer: Self-pay | Admitting: *Deleted

## 2017-11-07 VITALS — BP 134/82 | HR 72 | Ht 70.0 in | Wt 221.0 lb

## 2017-11-07 DIAGNOSIS — I10 Essential (primary) hypertension: Secondary | ICD-10-CM | POA: Insufficient documentation

## 2017-11-07 DIAGNOSIS — I480 Paroxysmal atrial fibrillation: Secondary | ICD-10-CM | POA: Insufficient documentation

## 2017-11-07 DIAGNOSIS — Z823 Family history of stroke: Secondary | ICD-10-CM | POA: Diagnosis not present

## 2017-11-07 DIAGNOSIS — Z79899 Other long term (current) drug therapy: Secondary | ICD-10-CM | POA: Insufficient documentation

## 2017-11-07 DIAGNOSIS — Z9889 Other specified postprocedural states: Secondary | ICD-10-CM | POA: Insufficient documentation

## 2017-11-07 DIAGNOSIS — Z880 Allergy status to penicillin: Secondary | ICD-10-CM | POA: Insufficient documentation

## 2017-11-07 DIAGNOSIS — Z87891 Personal history of nicotine dependence: Secondary | ICD-10-CM | POA: Insufficient documentation

## 2017-11-07 DIAGNOSIS — I4892 Unspecified atrial flutter: Secondary | ICD-10-CM | POA: Diagnosis present

## 2017-11-07 DIAGNOSIS — E7800 Pure hypercholesterolemia, unspecified: Secondary | ICD-10-CM | POA: Diagnosis not present

## 2017-11-07 DIAGNOSIS — F419 Anxiety disorder, unspecified: Secondary | ICD-10-CM | POA: Insufficient documentation

## 2017-11-07 DIAGNOSIS — Z9049 Acquired absence of other specified parts of digestive tract: Secondary | ICD-10-CM | POA: Insufficient documentation

## 2017-11-07 DIAGNOSIS — Z7901 Long term (current) use of anticoagulants: Secondary | ICD-10-CM | POA: Diagnosis not present

## 2017-11-07 DIAGNOSIS — Z8249 Family history of ischemic heart disease and other diseases of the circulatory system: Secondary | ICD-10-CM | POA: Diagnosis not present

## 2017-11-07 LAB — CBC
HCT: 44.3 % (ref 39.0–52.0)
Hemoglobin: 16.1 g/dL (ref 13.0–17.0)
MCH: 31.1 pg (ref 26.0–34.0)
MCHC: 36.3 g/dL — ABNORMAL HIGH (ref 30.0–36.0)
MCV: 85.7 fL (ref 78.0–100.0)
Platelets: 158 10*3/uL (ref 150–400)
RBC: 5.17 MIL/uL (ref 4.22–5.81)
RDW: 12.8 % (ref 11.5–15.5)
WBC: 4.9 10*3/uL (ref 4.0–10.5)

## 2017-11-07 LAB — BASIC METABOLIC PANEL
Anion gap: 10 (ref 5–15)
BUN: 12 mg/dL (ref 8–23)
CO2: 26 mmol/L (ref 22–32)
Calcium: 9.8 mg/dL (ref 8.9–10.3)
Chloride: 104 mmol/L (ref 98–111)
Creatinine, Ser: 0.79 mg/dL (ref 0.61–1.24)
GFR calc Af Amer: >60 mL/min (ref >60)
GFR calc non Af Amer: >60 mL/min (ref >60)
Glucose, Bld: 116 mg/dL — ABNORMAL HIGH (ref 70–99)
Potassium: 3.2 mmol/L — ABNORMAL LOW (ref 3.5–5.1)
Sodium: 140 mmol/L (ref 135–145)

## 2017-11-07 MED ORDER — METOPROLOL TARTRATE 50 MG PO TABS: 50.0000 mg | ORAL_TABLET | Freq: Two times a day (BID) | ORAL | 3 refills | Status: DC

## 2017-11-07 MED ORDER — POTASSIUM CHLORIDE CRYS ER 20 MEQ PO TBCR: EXTENDED_RELEASE_TABLET | ORAL | 3 refills | Status: DC

## 2017-11-07 NOTE — Progress Notes (Signed)
Patient ID: Patrick Gray, male   DOB: March 01, 1955, 62 y.o.   MRN: [id number]     Primary Care Physician: [name], MD Referring Physician: Vadim Centola is a 62 y.o. male with a h/o afib 2015 found by primary. Was placed on flecainide 100 mg bid and has done well until seen 6/20 and was noted to be in aflutter with RVR.Because of breakthrough afib, he was scheduled for ablation 11/30/15.  F/u in afib clinic 3 13. He was pending shoulder surgery   and has been in afib x 2 weeks. Flecainide was decreased to 50 mg bid, as he was told he could try to wean off after cardioversion. and within several days he returned to afib.   After shoulder surgery, he did have afib for a couple of weeks and did return to [place] on his own.  He is in the afib clinic 11/15/16. He has returned to afib since the beginning of September. He had been trying to wean of flecainide  again. He has has some tremors and when he tries to wean off drug, the tremors get better. He is toying with the idea of having  another ablation vrs a cardioversion but has guests this week into next week and will wait until then to make the decision. He is tolerating well and is rate controlled. No missed doses of eliquis. He wishes to pursue another ablation.  F/u in fib clinic after 2nd ablation 12/19/16. He felt well until yesterday PM when he noted some wheezing, shortness of breath and could not lie flat last night to sleep. HR has been regular. No fever, chills, chest pain, cough. His weight is noted to be up 10 lbs from my last clinic visit around one month ago. He feels bloated in his abdomen.  F/u in afib clinic 12/26/16. He was started on lasix on last visit for fluid overload following ablation. He felt well after the first dose of lasix and by Saturday night( 2 doses), he was able to lie flat to sleep. He did not take lasix this am weight is down 8 lbs. Maintaining SR.  Pt is here for one month f/u ablation. He has not noted  any further afib. Weight is stable.Compliant with eliquis without any missed doses.  Pt being seen in clinic after calling to office, being out of rhythm x 2 weeks. He feels the trigger was waiting almost 2 weeks for test results for his wife, who has a h/o cancer. She got better news  than was expected but she is still pending surgery in the near future. Pt had stopped flecainide for worsening tremor of left arm, improved off flecainide. He took two doses of flecainide when he got out of rhythm and tremor worsened, so he says that he will no longer try flecainide. He states that he has never had tremor evaluated by neurologist, has mentioned to PCP.  F/u in afib clinic, 4/16. S/p successful cardioversion. He feels well. EKG shows SR.   F/u in afib clinic 9/16. He called to the clinic last week and asked to  increase his metoprolol as his HR's were elevated. His EKG shows atrial flutter, rate controlled. He has had the stress of his wife recently undergoing breast CA surgery x 3, his mother recently died and his adult Autistic daughter has been having some issues.  Today, he denies symptoms of palpitations, chest pain, shortness of breath, orthopnea, PND, lower extremity edema, dizziness, presyncope, syncope, or neurologic  sequela. Positive for fatigue in afib , hand tremor. The patient is tolerating medications without difficulties and is otherwise without complaint today.   Past Medical History:  Diagnosis Date  . Anxiety   . Deafness in left ear   . Dysrhythmia   . H/O cardiac radiofrequency ablation   . HTN (hypertension)   . Hypercholesterolemia   . Hypopotassemia   . Paroxysmal atrial fibrillation (HCC)   . Tremor    Past Surgical History:  Procedure Laterality Date  . ACOUSTIC NEUROMA RESECTION Left 19990  . APPENDECTOMY  1976  . ATRIAL FIBRILLATION ABLATION N/A 12/19/2016   Procedure: ATRIAL FIBRILLATION ABLATION;  Surgeon: Thompson Grayer, MD;  Location: [place] [facility];   Service: Cardiovascular;  Laterality: N/A;  . CARDIAC CATHETERIZATION N/A 10/07/2015   Procedure: Left Heart Cath and Coronary Angiography;  Surgeon: Josue Hector, MD;  Location: [place] [facility];  Service: Cardiovascular;  Laterality: N/A;  . CARDIOVERSION N/A 06/17/2013   Procedure: CARDIOVERSION;  Surgeon: Josue Hector, MD;  Location: [place];  Service: Cardiovascular;  Laterality: N/A;  . CARDIOVERSION N/A 05/11/2016   Procedure: CARDIOVERSION;  Surgeon: Sanda Klein, MD;  Location: [facility] ENDOSCOPY;  Service: Cardiovascular;  Laterality: N/A;  . CARDIOVERSION N/A 05/24/2017   Procedure: CARDIOVERSION;  Surgeon: Sueanne Margarita, MD;  Location: [facility] ENDOSCOPY;  Service: Cardiovascular;  Laterality: N/A;  . CHOLECYSTECTOMY  1976  . CLUB FOOT RELEASE  1968  . ELECTROPHYSIOLOGIC STUDY N/A 11/30/2015   Procedure: Atrial Fibrillation Ablation;  Surgeon: Thompson Grayer, MD;  Location: [place] [facility];  Service: Cardiovascular;  Laterality: N/A;  . TEE WITHOUT CARDIOVERSION N/A 05/11/2016   Procedure: TRANSESOPHAGEAL ECHOCARDIOGRAM (TEE);  Surgeon: Sanda Klein, MD;  Location: [place];  Service: Cardiovascular;  Laterality: N/A;  . TONSILLECTOMY  1967  . TOTAL SHOULDER ARTHROPLASTY Left 06/15/2016   Procedure: TOTAL SHOULDER ARTHROPLASTY;  Surgeon: Tania Ade, MD;  Location: [place];  Service: Orthopedics;  Laterality: Left;  Left total shoulder arthroplasty    Current Outpatient Medications  Medication Sig Dispense Refill  . acetaminophen (TYLENOL) 500 MG tablet Take 1,000 mg by mouth every 6 (six) hours as needed for moderate pain or headache.    Marland Kitchen amLODipine (NORVASC) 5 MG tablet Take 5 mg by mouth daily.     Marland Kitchen apixaban (ELIQUIS) 5 MG TABS tablet Take 1 tablet (5 mg total) by mouth 2 (two) times daily. 180 tablet 1  . atorvastatin (LIPITOR) 20 MG tablet Take 20 mg by mouth every evening.     . Cholecalciferol (VITAMIN D3 PO) Take 1 capsule by mouth daily.    . Coenzyme Q10  (COQ10) 200 MG CAPS Take 200 mg by mouth daily.    Marland Kitchen KLOR-CON M20 20 MEQ tablet Take 40 mEq by mouth every evening.     Marland Kitchen lisinopril-hydrochlorothiazide (PRINZIDE,ZESTORETIC) 20-25 MG per tablet Take 1 tablet by mouth daily.     . Magnesium 500 MG TABS Take 500 mg by mouth daily.    . metoprolol tartrate (LOPRESSOR) 50 MG tablet Take 1 tablet (50 mg total) by mouth 2 (two) times daily. May take extra 1/2 tablet as needed for breakthrough afib 200 tablet 3  . naproxen sodium (ANAPROX) 220 MG tablet Take 220-440 mg by mouth 2 (two) times daily as needed (pain).    . Zinc 50 MG TABS Take 50 mg by mouth daily.     No current facility-administered medications for this encounter.     Allergies  Allergen Reactions  .  Penicillins Rash and Other (See Comments)    Has patient had a PCN reaction causing immediate rash, facial/tongue/throat swelling, SOB or lightheadedness with hypotension: No Has patient had a PCN reaction causing severe rash involving mucus membranes or skin necrosis: No Has patient had a PCN reaction that required hospitalization No Has patient had a PCN reaction occurring within the last 10 years: No If all of the above answers are "NO", then may proceed with Cephalosporin use.     Social History   Socioeconomic History  . Marital status: Married    Spouse name: Not on file  . Number of children: Not on file  . Years of education: Not on file  . Highest education level: Not on file  Occupational History  . Not on file  Social Needs  . Financial resource strain: Not on file  . Food insecurity:    Worry: Not on file    Inability: Not on file  . Transportation needs:    Medical: Not on file    Non-medical: Not on file  Tobacco Use  . Smoking status: Former Research scientist (life sciences)  . Smokeless tobacco: Never Used  Substance and Sexual Activity  . Alcohol use: Yes    Comment: previously 20 beers per week, now 2 beers per week  . Drug use: No  . Sexual activity: Not on file    Lifestyle  . Physical activity:    Days per week: Not on file    Minutes per session: Not on file  . Stress: Not on file  Relationships  . Social connections:    Talks on phone: Not on file    Gets together: Not on file    Attends religious service: Not on file    Active member of club or organization: Not on file    Attends meetings of clubs or organizations: Not on file    Relationship status: Not on file  . Intimate partner violence:    Fear of current or ex partner: Not on file    Emotionally abused: Not on file    Physically abused: Not on file    Forced sexual activity: Not on file  Other Topics Concern  . Not on file  Social History Narrative   Lives in [place] wife.  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    Family History  Problem Relation Age of Onset  . Hypertension Father   . Hypertension Mother   . Stroke Mother   . CVA Mother   . Hypertension Brother   . Hypercholesterolemia Brother   . Atrial fibrillation Brother   . Congestive Heart Failure Brother     ROS- All systems are reviewed and negative except as per the HPI above  Physical Exam: Vitals:   11/07/17 1039  BP: 134/82  Pulse: 72  Weight: 100.2 kg  Height: 5\' 10"  (1.778 m)    GEN- The patient is well appearing, alert and oriented x 3 today.   Head- normocephalic, atraumatic Eyes-  Sclera clear, conjunctiva pink Ears- hearing intact Oropharynx- clear Neck- supple, no JVP Lymph- no cervical lymphadenopathy Lungs- Clear to ausculation bilaterally, normal work of breathing, few rales hears in lung bases Heart-regular rate and rhythm, no murmurs, rubs or gallops, PMI not laterally displaced GI- soft, NT, ND, + BS Extremities- no clubbing, cyanosis, or edema MS- no significant deformity or atrophy Skin- no rash or lesion Psych- euthymic mood, full affect Neuro- strength and sensation are intact  EKG- atrial flutter  at 72 bpm,  qrs  int 86 ms, qtc 247 ms, the automated EKG reading   says SR but compared to EKG's in SR, flutter waves are noted Epic records reviewed   Assessment and Plan: 1.  Paroxymal atrial flutter, s/p afib ablation 11/30/15 and 12/19/16 Out of rhythm x 10 days, thinks trigger was life stress of his wife CA illness, death of his mother and adult daughter with Autism issues No missed anticoagulation, no missed doses  Continue eliquis for chadsvasc score of 1 Continue metoprolol 50 mg 1 1/2 tab bid, but go back to 50 mg bid day of cardioversion Bmet/cbc today   F/u with  afib clinic one week after cardioversion  Butch Penny C. Aylen Rambert, [facility] [address] [place],  [address] [phone]

## 2017-11-07 NOTE — Patient Instructions (Signed)
Cardioversion scheduled for Monday, September 30th  - Arrive at the [facility] and go to admitting at [facility] not eat or drink anything after midnight the night prior to your procedure.  - Take all your morning medication with a sip of water prior to arrival.  - You will not be able to drive home after your procedure.   Morning of cardioversion go back to normal dosing of metoprolol at 50mg  twice a day

## 2017-11-19 ENCOUNTER — Ambulatory Visit (HOSPITAL_COMMUNITY): Payer: BC Managed Care – PPO | Admitting: Anesthesiology

## 2017-11-19 ENCOUNTER — Encounter (HOSPITAL_COMMUNITY)
Admission: RE | Disposition: A | Discharge status: Home / Self Care | Payer: Self-pay | Source: Ambulatory Visit | Attending: Internal Medicine

## 2017-11-19 ENCOUNTER — Ambulatory Visit (HOSPITAL_COMMUNITY)
Admission: RE | Admit: 2017-11-19 | Discharge: 2017-11-19 | Disposition: A | Discharge status: Home / Self Care | Payer: BC Managed Care – PPO | Source: Ambulatory Visit | Attending: Internal Medicine | Admitting: Internal Medicine

## 2017-11-19 ENCOUNTER — Encounter (HOSPITAL_COMMUNITY): Payer: Self-pay | Admitting: Anesthesiology

## 2017-11-19 ENCOUNTER — Other Ambulatory Visit: Payer: Self-pay

## 2017-11-19 DIAGNOSIS — Z96612 Presence of left artificial shoulder joint: Secondary | ICD-10-CM | POA: Insufficient documentation

## 2017-11-19 DIAGNOSIS — E669 Obesity, unspecified: Secondary | ICD-10-CM | POA: Diagnosis not present

## 2017-11-19 DIAGNOSIS — Z87891 Personal history of nicotine dependence: Secondary | ICD-10-CM | POA: Insufficient documentation

## 2017-11-19 DIAGNOSIS — Z9889 Other specified postprocedural states: Secondary | ICD-10-CM | POA: Diagnosis not present

## 2017-11-19 DIAGNOSIS — Z9049 Acquired absence of other specified parts of digestive tract: Secondary | ICD-10-CM | POA: Insufficient documentation

## 2017-11-19 DIAGNOSIS — Z7901 Long term (current) use of anticoagulants: Secondary | ICD-10-CM | POA: Diagnosis not present

## 2017-11-19 DIAGNOSIS — Z6831 Body mass index (BMI) 31.0-31.9, adult: Secondary | ICD-10-CM | POA: Insufficient documentation

## 2017-11-19 DIAGNOSIS — H9192 Unspecified hearing loss, left ear: Secondary | ICD-10-CM | POA: Diagnosis not present

## 2017-11-19 DIAGNOSIS — I499 Cardiac arrhythmia, unspecified: Secondary | ICD-10-CM | POA: Diagnosis not present

## 2017-11-19 DIAGNOSIS — I480 Paroxysmal atrial fibrillation: Secondary | ICD-10-CM | POA: Diagnosis not present

## 2017-11-19 DIAGNOSIS — E7800 Pure hypercholesterolemia, unspecified: Secondary | ICD-10-CM | POA: Insufficient documentation

## 2017-11-19 DIAGNOSIS — I4892 Unspecified atrial flutter: Secondary | ICD-10-CM

## 2017-11-19 DIAGNOSIS — E876 Hypokalemia: Secondary | ICD-10-CM | POA: Insufficient documentation

## 2017-11-19 DIAGNOSIS — Z955 Presence of coronary angioplasty implant and graft: Secondary | ICD-10-CM | POA: Diagnosis not present

## 2017-11-19 DIAGNOSIS — Z79899 Other long term (current) drug therapy: Secondary | ICD-10-CM | POA: Insufficient documentation

## 2017-11-19 DIAGNOSIS — I10 Essential (primary) hypertension: Secondary | ICD-10-CM | POA: Diagnosis not present

## 2017-11-19 HISTORY — PX: CARDIOVERSION: SHX1299

## 2017-11-19 SURGERY — CARDIOVERSION
Anesthesia: General

## 2017-11-19 MED ORDER — METOPROLOL TARTRATE 50 MG PO TABS: 75.0000 mg | ORAL_TABLET | Freq: Two times a day (BID) | ORAL | 5 refills | Status: DC

## 2017-11-19 MED ORDER — SODIUM CHLORIDE 0.9 % IV SOLN
INTRAVENOUS | Status: DC | PRN
  Administered 2017-11-19: 09:00:00 via INTRAVENOUS

## 2017-11-19 MED ORDER — LIDOCAINE 2% (20 MG/ML) 5 ML SYRINGE
INTRAMUSCULAR | Status: DC | PRN
  Administered 2017-11-19: 100 mg via INTRAVENOUS

## 2017-11-19 MED ORDER — PROPOFOL 10 MG/ML IV BOLUS
INTRAVENOUS | Status: DC | PRN
  Administered 2017-11-19: 100 mg via INTRAVENOUS

## 2017-11-19 NOTE — Anesthesia Preprocedure Evaluation (Signed)
Anesthesia Evaluation  Patient identified by MRN, date of birth, ID band Patient awake    Reviewed: Allergy & Precautions, NPO status , Patient's Chart, lab work & pertinent test results  Airway Mallampati: I  TM Distance: >3 FB Neck ROM: Full    Dental no notable dental hx. (+) Teeth Intact, Dental Advisory Given   Pulmonary former smoker,    Pulmonary exam normal breath sounds clear to auscultation       Cardiovascular hypertension, Pt. on medications Normal cardiovascular exam+ dysrhythmias Atrial Fibrillation  Rhythm:Regular Rate:Normal     Neuro/Psych Anxiety negative neurological ROS     GI/Hepatic negative GI ROS, Neg liver ROS,   Endo/Other    Renal/GU negative Renal ROS     Musculoskeletal   Abdominal (+) + obese,   Peds  Hematology   Anesthesia Other Findings   Reproductive/Obstetrics                             Anesthesia Physical Anesthesia Plan  ASA: II  Anesthesia Plan: General   Post-op Pain Management:    Induction: Intravenous  PONV Risk Score and Plan: Treatment may vary due to age or medical condition  Airway Management Planned: Nasal Cannula and Natural Airway  Additional Equipment:   Intra-op Plan:   Post-operative Plan:   Informed Consent: I have reviewed the patients History and Physical, chart, labs and discussed the procedure including the risks, benefits and alternatives for the proposed anesthesia with the patient or authorized representative who has indicated his/her understanding and acceptance.   Dental advisory given  Plan Discussed with:   Anesthesia Plan Comments:         Anesthesia Quick Evaluation

## 2017-11-19 NOTE — Anesthesia Procedure Notes (Signed)
Procedure Name: MAC Date/Time: 11/19/2017 9:02 AM Performed by: Marsa Aris, CRNA Pre-anesthesia Checklist: Patient identified, Emergency Drugs available, Suction available, Patient being monitored and Timeout performed Patient Re-evaluated:Patient Re-evaluated prior to induction Oxygen Delivery Method: Circle system utilized Preoxygenation: Pre-oxygenation with 100% oxygen Induction Type: IV induction Ventilation: Mask ventilation throughout procedure

## 2017-11-19 NOTE — Transfer of Care (Signed)
Immediate Anesthesia Transfer of Care Note  Patient: Patrick Gray  Procedure(s) Performed: CARDIOVERSION (N/A )  Patient Location: Endoscopy Unit  Anesthesia Type:MAC  Level of Consciousness: awake, alert  and oriented  Airway & Oxygen Therapy: Patient Spontanous Breathing  Post-op Assessment: Report given to RN and Post -op Vital signs reviewed and stable  Post vital signs: Reviewed and stable  Last Vitals:  Vitals Value Taken Time  BP    Temp    Pulse    Resp    SpO2      Last Pain:  Vitals:   11/19/17 0829  TempSrc: Oral  PainSc: 0-No pain         Complications: No apparent anesthesia complications

## 2017-11-19 NOTE — CV Procedure (Signed)
   CARDIOVERSION NOTE  Procedure: Electrical Cardioversion Indications:  Atrial Flutter  Procedure Details:  Consent: Risks of procedure as well as the alternatives and risks of each were explained to the (patient/caregiver).  Consent for procedure obtained.  Time Out: Verified patient identification, verified procedure, site/side was marked, verified correct patient position, special equipment/implants available, medications/allergies/relevent history reviewed, required imaging and test results available.  Performed  Patient placed on cardiac monitor, pulse oximetry, supplemental oxygen as necessary.  Sedation given: Propofol per anesthesia Pacer pads placed anterior and posterior chest.  Cardioverted 1 time(s).  Cardioverted at 150J biphasic.  Impression: Findings: Post procedure EKG shows: NSR Complications: None Patient did tolerate procedure well.  Plan: 1. Successful DCCV to NSR with a single 150J Biphasic shock 2. Although instructed to reduce metoprolol, his BP and HR control were improved on the 75 mg BID dose. I have directed him to continue 75 mg BID of metoprolol tartrate. 3. Follow-up with Roderic Palau, NP in the afib clinic  Time Spent Directly with the Patient:  30 minutes   Pixie Casino, MD, [facility], [place] Director of the Advanced Lipid Disorders &  Cardiovascular Risk Reduction Clinic Diplomate of the American Board of Clinical Lipidology Attending Cardiologist  Direct Dial: [phone]  Fax: [phone]  Website:  [email or web address] 11/19/2017, 9:17 AM

## 2017-11-19 NOTE — Anesthesia Postprocedure Evaluation (Signed)
Anesthesia Post Note  Patient: Patrick Gray  Procedure(s) Performed: CARDIOVERSION (N/A )     Patient location during evaluation: Endoscopy Anesthesia Type: General Level of consciousness: awake and alert Pain management: pain level controlled Vital Signs Assessment: post-procedure vital signs reviewed and stable Respiratory status: spontaneous breathing, nonlabored ventilation, respiratory function stable and patient connected to nasal cannula oxygen Cardiovascular status: blood pressure returned to baseline and stable Postop Assessment: no apparent nausea or vomiting Anesthetic complications: no    Last Vitals:  Vitals:   11/19/17 0930 11/19/17 0940  BP: 120/86 (!) 117/95  Pulse: (!) 56 73  Resp: 13 12  Temp:    SpO2: 97% 98%    Last Pain:  Vitals:   11/19/17 0940  TempSrc:   PainSc: 0-No pain                 Barnet Glasgow

## 2017-11-19 NOTE — H&P (Signed)
   INTERVAL PROCEDURE H&P  History and Physical Interval Note:  11/19/2017 8:16 AM  Patrick Gray has presented today for their planned procedure. The various methods of treatment have been discussed with the patient and family. After consideration of risks, benefits and other options for treatment, the patient has consented to the procedure.  The patients' outpatient history has been reviewed, patient examined, and no change in status from most recent office note within the past 30 days. I have reviewed the patients' chart and labs and will proceed as planned. Questions were answered to the patient's satisfaction.   Pixie Casino, MD, [facility], [place] Director of the Advanced Lipid Disorders &  Cardiovascular Risk Reduction Clinic Diplomate of the American Board of Clinical Lipidology Attending Cardiologist  Direct Dial: [phone]  Fax: [phone]  Website:  [email or web address]  Patrick Gray Patrick Gray 11/19/2017, 8:16 AM

## 2017-11-19 NOTE — Discharge Instructions (Signed)
Electrical Cardioversion, Care After °This sheet gives you information about how to care for yourself after your procedure. Your health care provider may also give you more specific instructions. If you have problems or questions, contact your health care provider. °What can I expect after the procedure? °After the procedure, it is common to have: °· Some redness on the skin where the shocks were given. ° °Follow these instructions at home: °· Do not drive for 24 hours if you were given a medicine to help you relax (sedative). °· Take over-the-counter and prescription medicines only as told by your health care provider. °· Ask your health care provider how to check your pulse. Check it often. °· Rest for 48 hours after the procedure or as told by your health care provider. °· Avoid or limit your caffeine use as told by your health care provider. °Contact a health care provider if: °· You feel like your heart is beating too quickly or your pulse is not regular. °· You have a serious muscle cramp that does not go away. °Get help right away if: °· You have discomfort in your chest. °· You are dizzy or you feel faint. °· You have trouble breathing or you are short of breath. °· Your speech is slurred. °· You have trouble moving an arm or leg on one side of your body. °· Your fingers or toes turn cold or blue. °This information is not intended to replace advice given to you by your health care provider. Make sure you discuss any questions you have with your health care provider. °Document Released: 11/27/2012 Document Revised: 09/10/2015 Document Reviewed: 08/13/2015 °Elsevier Interactive Patient Education © 2018 [employer] ° °

## 2017-12-04 ENCOUNTER — Encounter (HOSPITAL_COMMUNITY): Payer: Self-pay | Admitting: Nurse Practitioner

## 2017-12-04 ENCOUNTER — Ambulatory Visit (HOSPITAL_COMMUNITY)
Admission: RE | Admit: 2017-12-04 | Discharge: 2017-12-04 | Disposition: A | Discharge status: Home / Self Care | Payer: BC Managed Care – PPO | Source: Ambulatory Visit | Attending: Nurse Practitioner | Admitting: Nurse Practitioner

## 2017-12-04 VITALS — BP 130/82 | HR 55 | Ht 70.0 in | Wt 223.0 lb

## 2017-12-04 DIAGNOSIS — Z7901 Long term (current) use of anticoagulants: Secondary | ICD-10-CM | POA: Insufficient documentation

## 2017-12-04 DIAGNOSIS — I480 Paroxysmal atrial fibrillation: Secondary | ICD-10-CM | POA: Insufficient documentation

## 2017-12-04 DIAGNOSIS — Z87891 Personal history of nicotine dependence: Secondary | ICD-10-CM | POA: Insufficient documentation

## 2017-12-04 DIAGNOSIS — I10 Essential (primary) hypertension: Secondary | ICD-10-CM | POA: Diagnosis not present

## 2017-12-04 DIAGNOSIS — Z880 Allergy status to penicillin: Secondary | ICD-10-CM | POA: Insufficient documentation

## 2017-12-04 DIAGNOSIS — I4892 Unspecified atrial flutter: Secondary | ICD-10-CM | POA: Diagnosis present

## 2017-12-04 DIAGNOSIS — E7800 Pure hypercholesterolemia, unspecified: Secondary | ICD-10-CM | POA: Diagnosis not present

## 2017-12-04 DIAGNOSIS — Z79899 Other long term (current) drug therapy: Secondary | ICD-10-CM | POA: Insufficient documentation

## 2017-12-04 DIAGNOSIS — F419 Anxiety disorder, unspecified: Secondary | ICD-10-CM | POA: Diagnosis not present

## 2017-12-04 NOTE — Progress Notes (Signed)
Patient ID: Patrick Gray, male   DOB: 30-Jan-1956, 62 y.o.   MRN: [id number]     Primary Care Physician: [name], MD Referring Physician: Bevan Disney is a 62 y.o. male with a h/o afib 2015 found by primary. Was placed on flecainide 100 mg bid and has done well until seen 6/20 and was noted to be in aflutter with RVR.Because of breakthrough afib, he was scheduled for ablation 11/30/15.  F/u in afib clinic 3 13. He was pending shoulder surgery   and has been in afib x 2 weeks. Flecainide was decreased to 50 mg bid, as he was told he could try to wean off after cardioversion. and within several days he returned to afib.   After shoulder surgery, he did have afib for a couple of weeks and did return to [place] on his own.  He is in the afib clinic 11/15/16. He has returned to afib since the beginning of September. He had been trying to wean of flecainide  again. He has has some tremors and when he tries to wean off drug, the tremors get better. He is toying with the idea of having  another ablation vrs a cardioversion but has guests this week into next week and will wait until then to make the decision. He is tolerating well and is rate controlled. No missed doses of eliquis. He wishes to pursue another ablation.  F/u in fib clinic after 2nd ablation 12/19/16. He felt well until yesterday PM when he noted some wheezing, shortness of breath and could not lie flat last night to sleep. HR has been regular. No fever, chills, chest pain, cough. His weight is noted to be up 10 lbs from my last clinic visit around one month ago. He feels bloated in his abdomen.  F/u in afib clinic 12/26/16. He was started on lasix on last visit for fluid overload following ablation. He felt well after the first dose of lasix and by Saturday night( 2 doses), he was able to lie flat to sleep. He did not take lasix this am weight is down 8 lbs. Maintaining SR.  Pt is here for one month f/u ablation. He has not noted  any further afib. Weight is stable.Compliant with eliquis without any missed doses.  Pt being seen in clinic after calling to office, being out of rhythm x 2 weeks. He feels the trigger was waiting almost 2 weeks for test results for his wife, who has a h/o cancer. She got better news  than was expected but she is still pending surgery in the near future. Pt had stopped flecainide for worsening tremor of left arm, improved off flecainide. He took two doses of flecainide when he got out of rhythm and tremor worsened, so he says that he will no longer try flecainide. He states that he has never had tremor evaluated by neurologist, has mentioned to PCP.  F/u in afib clinic, 4/16. S/p successful cardioversion. He feels well. EKG shows SR.   F/u in afib clinic 9/16. He called to the clinic last week and asked to  increase his metoprolol as his HR's were elevated. His EKG shows atrial flutter, rate controlled. He has had the stress of his wife recently undergoing breast CA surgery x 3, his mother recently died and his adult Autistic daughter has been having some issues.  F/u in clinic 12/03/17, he had successful cardioversion and is still maintaining SR.   Today, he denies symptoms of palpitations,  chest pain, shortness of breath, orthopnea, PND, lower extremity edema, dizziness, presyncope, syncope, or neurologic sequela. Positive for fatigue in afib , hand tremor. The patient is tolerating medications without difficulties and is otherwise without complaint today.   Past Medical History:  Diagnosis Date  . Anxiety   . Deafness in left ear   . Dysrhythmia   . H/O cardiac radiofrequency ablation   . HTN (hypertension)   . Hypercholesterolemia   . Hypopotassemia   . Paroxysmal atrial fibrillation (HCC)   . Tremor    Past Surgical History:  Procedure Laterality Date  . ACOUSTIC NEUROMA RESECTION Left 19990  . APPENDECTOMY  1976  . ATRIAL FIBRILLATION ABLATION N/A 12/19/2016   Procedure: ATRIAL  FIBRILLATION ABLATION;  Surgeon: Thompson Grayer, MD;  Location: [place] [facility];  Service: Cardiovascular;  Laterality: N/A;  . CARDIAC CATHETERIZATION N/A 10/07/2015   Procedure: Left Heart Cath and Coronary Angiography;  Surgeon: Josue Hector, MD;  Location: [place] [facility];  Service: Cardiovascular;  Laterality: N/A;  . CARDIOVERSION N/A 06/17/2013   Procedure: CARDIOVERSION;  Surgeon: Josue Hector, MD;  Location: [facility];  Service: Cardiovascular;  Laterality: N/A;  . CARDIOVERSION N/A 05/11/2016   Procedure: CARDIOVERSION;  Surgeon: Sanda Klein, MD;  Location: [facility];  Service: Cardiovascular;  Laterality: N/A;  . CARDIOVERSION N/A 05/24/2017   Procedure: CARDIOVERSION;  Surgeon: Sueanne Margarita, MD;  Location: [facility]COPY;  Service: Cardiovascular;  Laterality: N/A;  . CARDIOVERSION N/A 11/19/2017   Procedure: CARDIOVERSION;  Surgeon: Pixie Casino, MD;  Location: [facility] ENDOSCOPY;  Service: Cardiovascular;  Laterality: N/A;  . CHOLECYSTECTOMY  1976  . CLUB FOOT RELEASE  1968  . ELECTROPHYSIOLOGIC STUDY N/A 11/30/2015   Procedure: Atrial Fibrillation Ablation;  Surgeon: Thompson Grayer, MD;  Location: [place] [facility];  Service: Cardiovascular;  Laterality: N/A;  . TEE WITHOUT CARDIOVERSION N/A 05/11/2016   Procedure: TRANSESOPHAGEAL ECHOCARDIOGRAM (TEE);  Surgeon: Sanda Klein, MD;  Location: [place];  Service: Cardiovascular;  Laterality: N/A;  . TONSILLECTOMY  1967  . TOTAL SHOULDER ARTHROPLASTY Left 06/15/2016   Procedure: TOTAL SHOULDER ARTHROPLASTY;  Surgeon: Tania Ade, MD;  Location: [place];  Service: Orthopedics;  Laterality: Left;  Left total shoulder arthroplasty    Current Outpatient Medications  Medication Sig Dispense Refill  . amLODipine (NORVASC) 5 MG tablet Take 5 mg by mouth daily.     Marland Kitchen apixaban (ELIQUIS) 5 MG TABS tablet Take 1 tablet (5 mg total) by mouth 2 (two) times daily. 180 tablet 1  . atorvastatin (LIPITOR) 20 MG tablet Take 20 mg  by mouth every evening.     . Cholecalciferol (VITAMIN D-3) 5000 units TABS Take 5,000 Units by mouth daily.    . Coenzyme Q10 (COQ10) 100 MG CAPS Take 100 mg by mouth daily.    Marland Kitchen ibuprofen (ADVIL,MOTRIN) 200 MG tablet Take 400-600 mg by mouth daily as needed (for pain).    Marland Kitchen lisinopril-hydrochlorothiazide (PRINZIDE,ZESTORETIC) 20-25 MG per tablet Take 1 tablet by mouth daily.     . Magnesium 500 MG TABS Take 500 mg by mouth daily.    . metoprolol tartrate (LOPRESSOR) 50 MG tablet Take 50 mg by mouth 2 (two) times daily.    . potassium chloride SA (KLOR-CON M20) 20 MEQ tablet Take 1 tablet in the AM and 2 tablets in the PM (Patient taking differently: Take 20-40 mEq by mouth See admin instructions. Take 1 tablet (20 meq) by mouth in the morning & take 2 tablets (40 meq)  by mouth in the evening.) 90 tablet 3  . sildenafil (REVATIO) 20 MG tablet Take 40 mg by mouth daily as needed for erectile dysfunction.   0   No current facility-administered medications for this encounter.     Allergies  Allergen Reactions  . Penicillins Rash and Other (See Comments)    Has patient had a PCN reaction causing immediate rash, facial/tongue/throat swelling, SOB or lightheadedness with hypotension: No Has patient had a PCN reaction causing severe rash involving mucus membranes or skin necrosis: No Has patient had a PCN reaction that required hospitalization No Has patient had a PCN reaction occurring within the last 10 years: No If all of the above answers are "NO", then may proceed with Cephalosporin use.     Social History   Socioeconomic History  . Marital status: Married    Spouse name: Not on file  . Number of children: Not on file  . Years of education: Not on file  . Highest education level: Not on file  Occupational History  . Not on file  Social Needs  . Financial resource strain: Not on file  . Food insecurity:    Worry: Not on file    Inability: Not on file  . Transportation needs:     Medical: Not on file    Non-medical: Not on file  Tobacco Use  . Smoking status: Former Research scientist (life sciences)  . Smokeless tobacco: Never Used  Substance and Sexual Activity  . Alcohol use: Yes    Comment: previously 20 beers per week, now 2 beers per week  . Drug use: No  . Sexual activity: Not on file  Lifestyle  . Physical activity:    Days per week: Not on file    Minutes per session: Not on file  . Stress: Not on file  Relationships  . Social connections:    Talks on phone: Not on file    Gets together: Not on file    Attends religious service: Not on file    Active member of club or organization: Not on file    Attends meetings of clubs or organizations: Not on file    Relationship status: Not on file  . Intimate partner violence:    Fear of current or ex partner: Not on file    Emotionally abused: Not on file    Physically abused: Not on file    Forced sexual activity: Not on file  Other Topics Concern  . Not on file  Social History Narrative   Lives in [place] wife.  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    Family History  Problem Relation Age of Onset  . Hypertension Father   . Hypertension Mother   . Stroke Mother   . CVA Mother   . Hypertension Brother   . Hypercholesterolemia Brother   . Atrial fibrillation Brother   . Congestive Heart Failure Brother     ROS- All systems are reviewed and negative except as per the HPI above  Physical Exam: Vitals:   12/04/17 1034  BP: 130/82  Pulse: (!) 55  Weight: 101.2 kg  Height: 5\' 10"  (1.778 m)    GEN- The patient is well appearing, alert and oriented x 3 today.   Head- normocephalic, atraumatic Eyes-  Sclera clear, conjunctiva pink Ears- hearing intact Oropharynx- clear Neck- supple, no JVP Lymph- no cervical lymphadenopathy Lungs- Clear to ausculation bilaterally, normal work of breathing, few rales hears in lung bases Heart-regular rate and rhythm, no murmurs, rubs or gallops,  PMI not laterally  displaced GI- soft, NT, ND, + BS Extremities- no clubbing, cyanosis, or edema MS- no significant deformity or atrophy Skin- no rash or lesion Psych- euthymic mood, full affect Neuro- strength and sensation are intact  EKG- Sinus brady at 55 bpm, Pr int 182 ms, qrs int 86 ms, qtc 415 ms in SR, flutter waves are noted Epic records reviewed   Assessment and Plan: 1.  Paroxymal atrial flutter, s/p afib ablation 11/30/15 and 12/19/16 Successful cardioversion 9/30 Continue eliquis for chadsvasc score of 1 Continue metoprolol 50 mg bid  2. HTN Stable  F/u with Dr. Rayann Heman 11/25    Geroge Baseman. [place], [facility] [address] [place], [place] [address] [phone]

## 2017-12-24 ENCOUNTER — Encounter: Payer: Self-pay | Admitting: Internal Medicine

## 2018-01-14 ENCOUNTER — Ambulatory Visit: Payer: BC Managed Care – PPO | Admitting: Internal Medicine

## 2018-01-14 ENCOUNTER — Encounter: Payer: Self-pay | Admitting: Internal Medicine

## 2018-01-14 VITALS — BP 130/86 | HR 72 | Ht 71.0 in | Wt 220.0 lb

## 2018-01-14 DIAGNOSIS — I480 Paroxysmal atrial fibrillation: Secondary | ICD-10-CM

## 2018-01-14 DIAGNOSIS — I10 Essential (primary) hypertension: Secondary | ICD-10-CM | POA: Diagnosis not present

## 2018-01-14 NOTE — Progress Notes (Signed)
PCP: [name], MD Primary Cardiologist: Dr Johnsie Cancel Primary EP: Dr Marijo Conception is a 62 y.o. male who presents today for routine electrophysiology followup.  Since last being seen in our clinic, the patient reports doing very well.  He had atypical atrial flutter in September for which he was cardioverted.  Today, he denies symptoms of palpitations, chest pain, shortness of breath,  lower extremity edema, dizziness, presyncope, or syncope.  The patient is otherwise without complaint today.   Past Medical History:  Diagnosis Date  . Anxiety   . Deafness in left ear   . Dysrhythmia   . H/O cardiac radiofrequency ablation   . HTN (hypertension)   . Hypercholesterolemia   . Hypopotassemia   . Paroxysmal atrial fibrillation (HCC)   . Tremor    Past Surgical History:  Procedure Laterality Date  . ACOUSTIC NEUROMA RESECTION Left 19990  . APPENDECTOMY  1976  . ATRIAL FIBRILLATION ABLATION N/A 12/19/2016   Procedure: ATRIAL FIBRILLATION ABLATION;  Surgeon: Thompson Grayer, MD;  Location: [place] [facility];  Service: Cardiovascular;  Laterality: N/A;  . CARDIAC CATHETERIZATION N/A 10/07/2015   Procedure: Left Heart Cath and Coronary Angiography;  Surgeon: Josue Hector, MD;  Location: [place] [facility];  Service: Cardiovascular;  Laterality: N/A;  . CARDIOVERSION N/A 06/17/2013   Procedure: CARDIOVERSION;  Surgeon: Josue Hector, MD;  Location: [place];  Service: Cardiovascular;  Laterality: N/A;  . CARDIOVERSION N/A 05/11/2016   Procedure: CARDIOVERSION;  Surgeon: Sanda Klein, MD;  Location: [place] ENDOSCOPY;  Service: Cardiovascular;  Laterality: N/A;  . CARDIOVERSION N/A 05/24/2017   Procedure: CARDIOVERSION;  Surgeon: Sueanne Margarita, MD;  Location: [facility] ENDOSCOPY;  Service: Cardiovascular;  Laterality: N/A;  . CARDIOVERSION N/A 11/19/2017   Procedure: CARDIOVERSION;  Surgeon: Pixie Casino, MD;  Location: [facility]COPY;  Service: Cardiovascular;  Laterality: N/A;  .  CHOLECYSTECTOMY  1976  . CLUB FOOT RELEASE  1968  . ELECTROPHYSIOLOGIC STUDY N/A 11/30/2015   Procedure: Atrial Fibrillation Ablation;  Surgeon: Thompson Grayer, MD;  Location: [place] [facility];  Service: Cardiovascular;  Laterality: N/A;  . TEE WITHOUT CARDIOVERSION N/A 05/11/2016   Procedure: TRANSESOPHAGEAL ECHOCARDIOGRAM (TEE);  Surgeon: Sanda Klein, MD;  Location: [place];  Service: Cardiovascular;  Laterality: N/A;  . TONSILLECTOMY  1967  . TOTAL SHOULDER ARTHROPLASTY Left 06/15/2016   Procedure: TOTAL SHOULDER ARTHROPLASTY;  Surgeon: Tania Ade, MD;  Location: [facility];  Service: Orthopedics;  Laterality: Left;  Left total shoulder arthroplasty    ROS- all systems are reviewed and negatives except as per HPI above  Current Outpatient Medications  Medication Sig Dispense Refill  . amLODipine (NORVASC) 5 MG tablet Take 5 mg by mouth daily.     Marland Kitchen apixaban (ELIQUIS) 5 MG TABS tablet Take 1 tablet (5 mg total) by mouth 2 (two) times daily. 180 tablet 1  . atorvastatin (LIPITOR) 20 MG tablet Take 20 mg by mouth every evening.     . Cholecalciferol (VITAMIN D-3) 5000 units TABS Take 5,000 Units by mouth daily.    . Coenzyme Q10 (COQ10) 100 MG CAPS Take 100 mg by mouth daily.    Marland Kitchen ibuprofen (ADVIL,MOTRIN) 200 MG tablet Take 400-600 mg by mouth daily as needed (for pain).    Marland Kitchen lisinopril-hydrochlorothiazide (PRINZIDE,ZESTORETIC) 20-25 MG per tablet Take 1 tablet by mouth daily.     . Magnesium 500 MG TABS Take 500 mg by mouth daily.    . metoprolol tartrate (LOPRESSOR) 50 MG tablet Take  50 mg by mouth 2 (two) times daily.    . potassium chloride SA (KLOR-CON M20) 20 MEQ tablet Take 1 tablet in the AM and 2 tablets in the PM (Patient taking differently: Take 20-40 mEq by mouth See admin instructions. Take 1 tablet (20 meq) by mouth in the morning & take 2 tablets (40 meq) by mouth in the evening.) 90 tablet 3  . sildenafil (REVATIO) 20 MG tablet Take 40 mg by mouth daily as needed for  erectile dysfunction.   0   No current facility-administered medications for this visit.     Physical Exam: Vitals:   01/14/18 1517  BP: 130/86  Pulse: 72  SpO2: 99%  Weight: 220 lb (99.8 kg)  Height: 5\' 11"  (1.803 m)    GEN- The patient is well appearing, alert and oriented x 3 today.   Head- normocephalic, atraumatic Eyes-  Sclera clear, conjunctiva pink Ears- hearing intact Oropharynx- clear Lungs- Clear to ausculation bilaterally, normal work of breathing Heart- Regular rate and rhythm, no murmurs, rubs or gallops, PMI not laterally displaced GI- soft, NT, ND, + BS Extremities- no clubbing, cyanosis, or edema  Wt Readings from Last 3 Encounters:  01/14/18 220 lb (99.8 kg)  12/04/17 223 lb (101.2 kg)  11/19/17 220 lb (99.8 kg)    EKG tracing ordered today is personally reviewed and shows sinus rhythm  Assessment and Plan:  1. Paroxysmal atrial fibrillation Doing well post ablation off AAD therapy He did have atypical atrial flutter in September requiring cardioversion.  chads2vasc score is 1.  He prefers long term anticoagulation. Today, we discussed AADs and ablation.  He is clear that he does not wish to make changes at this time.  2. HTN Stable No change required today  3. Tachycardia mediated CM EF has recovered with sinus rhythm  Return in 6 months  Thompson Grayer MD, [facility] 01/14/2018 3:23 PM

## 2018-01-14 NOTE — Patient Instructions (Addendum)

## 2018-07-11 ENCOUNTER — Other Ambulatory Visit: Payer: Self-pay | Admitting: Internal Medicine

## 2018-07-11 NOTE — Telephone Encounter (Signed)
63 years old 99kg Scr 0.79 on 11/07/17 Last OV 01/14/18 eliquis 5mg  BID sent to pharmacy

## 2018-07-16 ENCOUNTER — Telehealth: Payer: Self-pay

## 2018-07-19 ENCOUNTER — Telehealth: Payer: BC Managed Care – PPO | Admitting: Internal Medicine

## 2018-07-23 ENCOUNTER — Other Ambulatory Visit (HOSPITAL_COMMUNITY): Payer: Self-pay | Admitting: *Deleted

## 2018-07-23 ENCOUNTER — Other Ambulatory Visit (HOSPITAL_COMMUNITY): Payer: Self-pay | Admitting: Nurse Practitioner

## 2018-07-23 MED ORDER — POTASSIUM CHLORIDE CRYS ER 20 MEQ PO TBCR: 20.0000 meq | EXTENDED_RELEASE_TABLET | ORAL | 1 refills | Status: DC

## 2018-07-29 ENCOUNTER — Other Ambulatory Visit (HOSPITAL_COMMUNITY): Payer: Self-pay | Admitting: *Deleted

## 2018-07-29 MED ORDER — POTASSIUM CHLORIDE CRYS ER 20 MEQ PO TBCR: EXTENDED_RELEASE_TABLET | ORAL | 6 refills | Status: DC

## 2018-07-29 MED ORDER — POTASSIUM CHLORIDE CRYS ER 20 MEQ PO TBCR: 20.0000 meq | EXTENDED_RELEASE_TABLET | ORAL | 6 refills | Status: DC

## 2018-08-13 ENCOUNTER — Telehealth: Payer: Self-pay

## 2018-08-13 NOTE — Progress Notes (Signed)
Electrophysiology Office Note Date: 08/15/2018  ID:  Patrick Gray, Patrick Gray 1955/08/11, MRN [id number]  PCP: [name], MD Primary Cardiologist: Johnsie Cancel Electrophysiologist: Allred  CC: AF follow up  Patrick Gray is a 63 y.o. male seen today for Dr Rayann Heman.  He presents today for routine electrophysiology followup.  Since last being seen in our clinic, the patient reports doing very well. He finds that stress is a trigger for his AF episodes. When he has his episodes, they last for several days.  He is symptomatic with fatigue with his episodes and takes an extra 1/2 metoprolol.  He does not have palpitations and his rates are well controlled in AF.   He denies chest pain, palpitations, dyspnea, PND, orthopnea, nausea, vomiting, dizziness, syncope, edema, weight gain, or early satiety.  Past Medical History:  Diagnosis Date  . Anxiety   . Deafness in left ear   . Dysrhythmia   . H/O cardiac radiofrequency ablation   . HTN (hypertension)   . Hypercholesterolemia   . Hypopotassemia   . Paroxysmal atrial fibrillation (HCC)   . Tremor    Past Surgical History:  Procedure Laterality Date  . ACOUSTIC NEUROMA RESECTION Left 19990  . APPENDECTOMY  1976  . ATRIAL FIBRILLATION ABLATION N/A 12/19/2016   Procedure: ATRIAL FIBRILLATION ABLATION;  Surgeon: Thompson Grayer, MD;  Location: [place] [facility];  Service: Cardiovascular;  Laterality: N/A;  . CARDIAC CATHETERIZATION N/A 10/07/2015   Procedure: Left Heart Cath and Coronary Angiography;  Surgeon: Josue Hector, MD;  Location: [place] [facility];  Service: Cardiovascular;  Laterality: N/A;  . CARDIOVERSION N/A 06/17/2013   Procedure: CARDIOVERSION;  Surgeon: Josue Hector, MD;  Location: [place];  Service: Cardiovascular;  Laterality: N/A;  . CARDIOVERSION N/A 05/11/2016   Procedure: CARDIOVERSION;  Surgeon: Sanda Klein, MD;  Location: [place] ENDOSCOPY;  Service: Cardiovascular;  Laterality: N/A;  . CARDIOVERSION N/A 05/24/2017   Procedure: CARDIOVERSION;  Surgeon: Sueanne Margarita, MD;  Location: [facility] ENDOSCOPY;  Service: Cardiovascular;  Laterality: N/A;  . CARDIOVERSION N/A 11/19/2017   Procedure: CARDIOVERSION;  Surgeon: Pixie Casino, MD;  Location: [facility] ENDOSCOPY;  Service: Cardiovascular;  Laterality: N/A;  . CHOLECYSTECTOMY  1976  . CLUB FOOT RELEASE  1968  . ELECTROPHYSIOLOGIC STUDY N/A 11/30/2015   Procedure: Atrial Fibrillation Ablation;  Surgeon: Thompson Grayer, MD;  Location: [place] [facility];  Service: Cardiovascular;  Laterality: N/A;  . TEE WITHOUT CARDIOVERSION N/A 05/11/2016   Procedure: TRANSESOPHAGEAL ECHOCARDIOGRAM (TEE);  Surgeon: Sanda Klein, MD;  Location: [place];  Service: Cardiovascular;  Laterality: N/A;  . TONSILLECTOMY  1967  . TOTAL SHOULDER ARTHROPLASTY Left 06/15/2016   Procedure: TOTAL SHOULDER ARTHROPLASTY;  Surgeon: Tania Ade, MD;  Location: [place];  Service: Orthopedics;  Laterality: Left;  Left total shoulder arthroplasty    Current Outpatient Medications  Medication Sig Dispense Refill  . amLODipine (NORVASC) 5 MG tablet Take 5 mg by mouth daily.     Marland Kitchen atorvastatin (LIPITOR) 20 MG tablet Take 20 mg by mouth every evening.     . Cholecalciferol (VITAMIN D-3) 5000 units TABS Take 5,000 Units by mouth daily.    . Coenzyme Q10 (COQ10) 100 MG CAPS Take 100 mg by mouth daily.    Marland Kitchen ELIQUIS 5 MG TABS tablet Take 1 tablet by mouth twice daily 180 tablet 1  . ibuprofen (ADVIL,MOTRIN) 200 MG tablet Take 400-600 mg by mouth daily as needed (for pain).    Marland Kitchen lisinopril-hydrochlorothiazide (PRINZIDE,ZESTORETIC) 20-25 MG  per tablet Take 1 tablet by mouth daily.     . Magnesium 500 MG TABS Take 500 mg by mouth daily.    . metoprolol tartrate (LOPRESSOR) 50 MG tablet Take 50 mg by mouth 2 (two) times daily.    . potassium chloride SA (K-DUR) 20 MEQ tablet Take 1 tablet (20 meq) by mouth in the morning & take 2 tablets (40 meq) by mouth in the evening. 90 tablet 6  . sildenafil (REVATIO)  20 MG tablet Take 40 mg by mouth daily as needed for erectile dysfunction.   0   No current facility-administered medications for this visit.     Allergies:   Penicillins   Social History: Social History   Socioeconomic History  . Marital status: Married    Spouse name: Not on file  . Number of children: Not on file  . Years of education: Not on file  . Highest education level: Not on file  Occupational History  . Not on file  Social Needs  . Financial resource strain: Not on file  . Food insecurity    Worry: Not on file    Inability: Not on file  . Transportation needs    Medical: Not on file    Non-medical: Not on file  Tobacco Use  . Smoking status: Former Research scientist (life sciences)  . Smokeless tobacco: Never Used  Substance and Sexual Activity  . Alcohol use: Yes    Comment: previously 20 beers per week, now 2 beers per week  . Drug use: No  . Sexual activity: Not on file  Lifestyle  . Physical activity    Days per week: Not on file    Minutes per session: Not on file  . Stress: Not on file  Relationships  . Social Herbalist on phone: Not on file    Gets together: Not on file    Attends religious service: Not on file    Active member of club or organization: Not on file    Attends meetings of clubs or organizations: Not on file    Relationship status: Not on file  . Intimate partner violence    Fear of current or ex partner: Not on file    Emotionally abused: Not on file    Physically abused: Not on file    Forced sexual activity: Not on file  Other Topics Concern  . Not on file  Social History Narrative   Lives in [place] wife.  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    Family History: Family History  Problem Relation Age of Onset  . Hypertension Father   . Hypertension Mother   . Stroke Mother   . CVA Mother   . Hypertension Brother   . Hypercholesterolemia Brother   . Atrial fibrillation Brother   . Congestive Heart Failure Brother     Review  of Systems: All other systems reviewed and are otherwise negative except as noted above.   Physical Exam: VS:  BP 136/88   Pulse 73   Ht 5\' 11"  (1.803 m)   Wt 224 lb 12.8 oz (102 kg)   SpO2 97%   BMI 31.35 kg/m  , BMI Body mass index is 31.35 kg/m. Wt Readings from Last 3 Encounters:  08/14/18 224 lb 12.8 oz (102 kg)  01/14/18 220 lb (99.8 kg)  12/04/17 223 lb (101.2 kg)    GEN- The patient is well appearing, alert and oriented x 3 today.   HEENT: normocephalic, atraumatic; sclera clear, conjunctiva  pink; hearing intact; oropharynx clear; neck supple  Lungs- Clear to ausculation bilaterally, normal work of breathing.  No wheezes, rales, rhonchi Heart- Regular rate and rhythm  GI- soft, non-tender, non-distended, bowel sounds present  Extremities- no clubbing, cyanosis, or edema; DP/PT/radial pulses 2+ bilaterally MS- no significant deformity or atrophy Skin- warm and dry, no rash or lesion  Psych- euthymic mood, full affect Neuro- strength and sensation are intact   EKG:  EKG is ordered today. The ekg ordered today shows sinus rhythm  Recent Labs: 11/07/2017: BUN 12; Creatinine, Ser 0.79; Hemoglobin 16.1; Platelets 158; Potassium 3.2; Sodium 140    Other studies Reviewed: Additional studies/ records that were reviewed today include: Dr Jackalyn Lombard office notes  Assessment and Plan:  1.  Paroxysmal atrial fibrillation Doing well off AAD therapy He has occasional episodes of AF but prefers current management.  We discussed ILR implant/Apple Watch/Kardia to better classify AF burden, he does not wish to pursue at this time.  CHADS2VASC is 1 - he prefers long term anticoagulation  2.  HTN Stable No change required today  3.  Tachycardia mediated cardiomyopathy Resolved in SR    Current medicines are reviewed at length with the patient today.   The patient does not have concerns regarding his medicines.  The following changes were made today:  none  Labs/ tests  ordered today include: none Orders Placed This Encounter  Procedures  . EKG 12-Lead     Disposition:   Follow up with Dr Rayann Heman in 6 months    Signed, Chanetta Marshall, NP 08/15/2018 8:33 AM   [facility] [address] [place] [address] [phone] (office) [phone] (fax)

## 2018-08-13 NOTE — Telephone Encounter (Signed)

## 2018-08-14 ENCOUNTER — Ambulatory Visit (INDEPENDENT_AMBULATORY_CARE_PROVIDER_SITE_OTHER): Payer: BC Managed Care – PPO | Admitting: Nurse Practitioner

## 2018-08-14 ENCOUNTER — Encounter: Payer: Self-pay | Admitting: Nurse Practitioner

## 2018-08-14 ENCOUNTER — Other Ambulatory Visit: Payer: Self-pay

## 2018-08-14 VITALS — BP 136/88 | HR 73 | Ht 71.0 in | Wt 224.8 lb

## 2018-08-14 DIAGNOSIS — R000 Tachycardia, unspecified: Secondary | ICD-10-CM | POA: Diagnosis not present

## 2018-08-14 DIAGNOSIS — I43 Cardiomyopathy in diseases classified elsewhere: Secondary | ICD-10-CM | POA: Diagnosis not present

## 2018-08-14 DIAGNOSIS — I480 Paroxysmal atrial fibrillation: Secondary | ICD-10-CM

## 2018-08-14 DIAGNOSIS — I10 Essential (primary) hypertension: Secondary | ICD-10-CM | POA: Diagnosis not present

## 2018-08-14 DIAGNOSIS — I428 Other cardiomyopathies: Secondary | ICD-10-CM

## 2018-08-14 NOTE — Patient Instructions (Signed)
Medication Instructions:  none If you need a refill on your cardiac medications before your next appointment, please call your pharmacy.   Lab work: none If you have labs (blood work) drawn today and your tests are completely normal, you will receive your results only by: Marland Kitchen MyChart Message (if you have MyChart) OR . A paper copy in the mail If you have any lab test that is abnormal or we need to change your treatment, we will call you to review the results.  Testing/Procedures: none  Follow-Up: At [facility], you and your health needs are our priority.  As part of our continuing mission to provide you with exceptional heart care, we have created designated Provider Care Teams.  These Care Teams include your primary Cardiologist (physician) and Advanced Practice Providers (APPs -  Physician Assistants and Nurse Practitioners) who all work together to provide you with the care you need, when you need it. You will need a follow up appointment in 6 months.  Please call our office 2 months in advance to schedule this appointment.  You may see Dr Rayann Heman or one of the following Advanced Practice Providers on your designated Care Team:   Chanetta Marshall, NP . Tommye Standard, PA-C  Any Other Special Instructions Will Be Listed Below (If Applicable).

## 2018-12-23 ENCOUNTER — Other Ambulatory Visit (HOSPITAL_COMMUNITY): Payer: Self-pay | Admitting: Nurse Practitioner

## 2019-01-12 ENCOUNTER — Other Ambulatory Visit: Payer: Self-pay | Admitting: Internal Medicine

## 2019-01-13 NOTE — Telephone Encounter (Signed)
Age 63, weight 102kg, SCr 0.77 checked at [facility] on 8/11/320. Last OV June 2020, afib indication

## 2019-03-03 ENCOUNTER — Ambulatory Visit: Payer: BC Managed Care – PPO | Admitting: Internal Medicine

## 2019-03-16 NOTE — Progress Notes (Signed)
Cardiology Office Note Date:  03/18/2019  Patient ID:  Patrick Gray, Patrick Gray 11/10/1955, MRN [id number] PCP:  [name], MD  Cardiologist: Dr. Johnsie Cancel  Electrophysiologist:  Dr. Rayann Heman    Chief Complaint: planned follow up  History of Present Illness: Patrick Gray is a 64 y.o. male with history of brain tumor (treated surgically 30 years ago, remained with L facial weakness, deaf L ear),  HTN, HLD, paroxysmal Afib   He comes in today to be seen for Dr. Rayann Heman, last seen by EP service I June 2020 with Genene Churn, NP.  He mentioned stress as a trigger for his AF, episodes described as occasional, would last a couple days, associated with fatigue, rates were controlled, would use additional metoprolol.  He was comfortable with his current burden and management regime, did not want to consider loop or adjustment in meds.  He is doing well.  No symptoms of AFib since his last DCCV.  He will infrequently feel sme skipped/extra beats and will take an additional 1/2 tab of his metoprolol.  This is infrequently.   He is active, walks 3 days week for exercise, hoping to be able to go the gym again soon.  He denies any CP, SOB, DOE.  NO dizzy spells, near syncope or syncope.  No bleeding with his Eliquis  Has his physical with his PMD in a couple weeks.    AFib Hx Diagnosed March 2015 Flutter diagnosed June 2017 PVI, CTI ablation 11/30/2015, Dr. Rayann Heman 12/19/2016: underwent repeat EPS, ablation with Dr. Rayann Heman mitral annular flutter upon presentation successfully ablated, carina between the right superior and inferior pulmonary veins at baseline. The left PVs were quiescent from the prior ablation procedure.  Successful reelectrical isolation of the right pulmonary veins   DCCV march 2018, April 2019,  Oct 2019  AAD Flecainide 2015 >> stopped Jan 20192/2 tremor  Past Medical History:  Diagnosis Date  . Anxiety   . Deafness in left ear   . Dysrhythmia   . H/O cardiac radiofrequency  ablation   . HTN (hypertension)   . Hypercholesterolemia   . Hypopotassemia   . Paroxysmal atrial fibrillation (HCC)   . Tremor     Past Surgical History:  Procedure Laterality Date  . ACOUSTIC NEUROMA RESECTION Left 19990  . APPENDECTOMY  1976  . ATRIAL FIBRILLATION ABLATION N/A 12/19/2016   Procedure: ATRIAL FIBRILLATION ABLATION;  Surgeon: Thompson Grayer, MD;  Location: [place] CV LAB;  Service: Cardiovascular;  Laterality: N/A;  . CARDIAC CATHETERIZATION N/A 10/07/2015   Procedure: Left Heart Cath and Coronary Angiography;  Surgeon: Josue Hector, MD;  Location: [place] CV LAB;  Service: Cardiovascular;  Laterality: N/A;  . CARDIOVERSION N/A 06/17/2013   Procedure: CARDIOVERSION;  Surgeon: Josue Hector, MD;  Location: [place];  Service: Cardiovascular;  Laterality: N/A;  . CARDIOVERSION N/A 05/11/2016   Procedure: CARDIOVERSION;  Surgeon: Sanda Klein, MD;  Location: [place] ENDOSCOPY;  Service: Cardiovascular;  Laterality: N/A;  . CARDIOVERSION N/A 05/24/2017   Procedure: CARDIOVERSION;  Surgeon: Sueanne Margarita, MD;  Location: [facility] ENDOSCOPY;  Service: Cardiovascular;  Laterality: N/A;  . CARDIOVERSION N/A 11/19/2017   Procedure: CARDIOVERSION;  Surgeon: Pixie Casino, MD;  Location: [facility] ENDOSCOPY;  Service: Cardiovascular;  Laterality: N/A;  . CHOLECYSTECTOMY  1976  . CLUB FOOT RELEASE  1968  . ELECTROPHYSIOLOGIC STUDY N/A 11/30/2015   Procedure: Atrial Fibrillation Ablation;  Surgeon: Thompson Grayer, MD;  Location: [place] CV LAB;  Service: Cardiovascular;  Laterality: N/A;  .  TEE WITHOUT CARDIOVERSION N/A 05/11/2016   Procedure: TRANSESOPHAGEAL ECHOCARDIOGRAM (TEE);  Surgeon: Sanda Klein, MD;  Location: [place];  Service: Cardiovascular;  Laterality: N/A;  . TONSILLECTOMY  1967  . TOTAL SHOULDER ARTHROPLASTY Left 06/15/2016   Procedure: TOTAL SHOULDER ARTHROPLASTY;  Surgeon: Tania Ade, MD;  Location: [place];  Service: Orthopedics;  Laterality: Left;  Left  total shoulder arthroplasty    Current Outpatient Medications  Medication Sig Dispense Refill  . amLODipine (NORVASC) 5 MG tablet Take 5 mg by mouth daily.     Marland Kitchen atorvastatin (LIPITOR) 20 MG tablet Take 20 mg by mouth every evening.     . Cholecalciferol (VITAMIN D-3) 5000 units TABS Take 5,000 Units by mouth daily.    . Coenzyme Q10 (COQ10) 100 MG CAPS Take 100 mg by mouth daily.    Marland Kitchen ELIQUIS 5 MG TABS tablet Take 1 tablet by mouth twice daily 180 tablet 1  . ibuprofen (ADVIL,MOTRIN) 200 MG tablet Take 400-600 mg by mouth daily as needed (for pain).    Marland Kitchen lisinopril-hydrochlorothiazide (PRINZIDE,ZESTORETIC) 20-25 MG per tablet Take 1 tablet by mouth daily.     . Magnesium 500 MG TABS Take 500 mg by mouth daily.    . metoprolol tartrate (LOPRESSOR) 50 MG tablet TAKE 1 TABLET BY MOUTH TWICE DAILY MAY  TAKE  AN  EXTRA  ONE-HALF  TABLET  AS  NEEDED  FOR  BREAKTHROUGH  AFIB 200 tablet 0  . potassium chloride SA (K-DUR) 20 MEQ tablet Take 1 tablet (20 meq) by mouth in the morning & take 2 tablets (40 meq) by mouth in the evening. 90 tablet 6  . sildenafil (REVATIO) 20 MG tablet Take 40 mg by mouth daily as needed for erectile dysfunction.   0   No current facility-administered medications for this visit.    Allergies:   Penicillins   Social History:  The patient  reports that he has quit smoking. He has never used smokeless tobacco. He reports current alcohol use. He reports that he does not use drugs.   Family History:  The patient's family history includes Atrial fibrillation in his brother; CVA in his mother; Congestive Heart Failure in his brother; Hypercholesterolemia in his brother; Hypertension in his brother, father, and mother; Stroke in his mother.  ROS:  Please see the history of present illness.  All other systems are reviewed and otherwise negative.   PHYSICAL EXAM:  VS:  BP (!) 150/96   Pulse 68   Ht 5\' 11"  (1.803 m)   Wt 220 lb (99.8 kg)   SpO2 95%   BMI 30.68 kg/m  BMI:  Body mass index is 30.68 kg/m. Well nourished, well developed, in no acute distress  HEENT: normocephalic, atraumatic  Neck: no JVD, carotid bruits or masses Cardiac: RRR; no significant murmurs, no rubs, or gallops Lungs:  CTA b/l, no wheezing, rhonchi or rales  Abd: soft, nontender, obese MS: no deformity or atrophy Ext: no edema  Skin: warm and dry, no rash Neuro:  No gross deficits appreciated, L arm tremor Psych: euthymic mood, full affect    EKG:  Not done today   01/22/2017: TTE Study Conclusions - Left ventricle: The cavity size was mildly dilated. Wall   thickness was increased in a pattern of mild LVH. Systolic   function was normal. The estimated ejection fraction was in the   range of 55% to 60%. - Mitral valve: There was mild regurgitation. - Left atrium: The atrium was mildly dilated.  12/19/2016: EPS, Ablation  CONCLUSIONS: 1. Left atrial clockwise mitral annular flutter upon presentation successfully ablated along the mitral isthmus.  2. Intracardiac echo reveals a moderate sized left atrium. 3. Return of electrical activity along the carina between the right superior and inferior pulmonary veins at baseline. The left PVs were quiescent from the prior ablation procedure.  4. Successful reelectrical isolation of the right pulmonary veins  5. Additional mapping and ablation within the left atrium due to persistence of atrial fibrillation with a posterior wall box demonstrated  6. No inducible arrhythmias following ablation 7. No early apparent complications.   10/07/2015; LHC  Prox RCA lesion, 30 %stenosed.  RPDA lesion, 30 %stenosed.   Normal right dominant coronary arteries with false positive myovue EF 65% Only 30% proximal/mid and PLA lesions seen    11/30/2015: EPS/Ablation CONCLUSIONS: 1. Sinus rhythm upon presentation.  2. Intracardiac echo reveals a moderate sized left atrium with four separate pulmonary veins without evidence of  pulmonary vein stenosis. 3. Successful electrical isolation and anatomical encircling of all four pulmonary veins with radiofrequency current. Additional ablation performed within the left atrium.  4. Cavo-tricuspid isthmus ablation was performed with complete bidirectional isthmus block achieved.  5. Unsuccessful cardioversion post ablation 6. No early apparent complications.   Recent Labs: No results found for requested labs within last 8760 hours.  No results found for requested labs within last 8760 hours.   CrCl cannot be calculated (Patient's most recent lab result is older than the maximum 21 days allowed.).   Wt Readings from Last 3 Encounters:  03/18/19 220 lb (99.8 kg)  08/14/18 224 lb 12.8 oz (102 kg)  01/14/18 220 lb (99.8 kg)     Other studies reviewed: Additional studies/records reviewed today include: summarized above  ASSESSMENT AND PLAN:  1. Paroxysmal AFib     CHA2DS2Vasc is one, on Eliquis, appropriately dosed by last labs     0% burden by symptoms     CBC, BMET today  2. HTN     He reports much better at home, checks regularly  3. L arm tremor     Reported a L arm tremor initially after his surgery 30 years ago that got better     Seemed to resurface with the flecainide and remains     He has discussed with his PMD, he does not want to get a neurology evaluation    Disposition: F/u with [place] in 1 year, sooner if needed  Current medicines are reviewed at length with the patient today.  The patient did not have any concerns regarding medicines.  Venetia Night, PA-C 03/18/2019 12:26 PM     [place] [id number] [phone] (office)  [phone] (fax)

## 2019-03-18 ENCOUNTER — Ambulatory Visit: Payer: BC Managed Care – PPO | Admitting: Physician Assistant

## 2019-03-18 ENCOUNTER — Other Ambulatory Visit: Payer: Self-pay

## 2019-03-18 VITALS — BP 150/96 | HR 68 | Ht 71.0 in | Wt 220.0 lb

## 2019-03-18 DIAGNOSIS — I10 Essential (primary) hypertension: Secondary | ICD-10-CM

## 2019-03-18 DIAGNOSIS — I480 Paroxysmal atrial fibrillation: Secondary | ICD-10-CM | POA: Diagnosis not present

## 2019-03-18 NOTE — Patient Instructions (Signed)
Medication Instructions:  Your physician recommends that you continue on your current medications as directed. Please refer to the Current Medication list given to you today.  *If you need a refill on your cardiac medications before your next appointment, please call your pharmacy*  Lab Work:  BMET AND CBC   If you have labs (blood work) drawn today and your tests are completely normal, you will receive your results only by: Marland Kitchen MyChart Message (if you have MyChart) OR . A paper copy in the mail If you have any lab test that is abnormal or we need to change your treatment, we will call you to review the results.  Testing/Procedures:  NONE ORDERED  TODAY   Follow-Up: At [facility], you and your health needs are our priority.  As part of our continuing mission to provide you with exceptional heart care, we have created designated Provider Care Teams.  These Care Teams include your primary Cardiologist (physician) and Advanced Practice Providers (APPs -  Physician Assistants and Nurse Practitioners) who all work together to provide you with the care you need, when you need it.  Your next appointment:   1 year(s)  The format for your next appointment:   In Person  Provider:   You may see Dr. Rayann Heman  or one of the following Advanced Practice Providers on your designated Care Team:    Chanetta Marshall, NP  Tommye Standard, PA-C  Legrand Como "Oda [place], [place]    Other Instructions

## 2019-03-19 LAB — BASIC METABOLIC PANEL
BUN/Creatinine Ratio: 18 (ref 10–24)
BUN: 16 mg/dL (ref 8–27)
CO2: 27 mmol/L (ref 20–29)
Calcium: 10 mg/dL (ref 8.6–10.2)
Chloride: 101 mmol/L (ref 96–106)
Creatinine, Ser: 0.87 mg/dL (ref 0.76–1.27)
GFR calc Af Amer: 106 mL/min/{1.73_m2} (ref >59)
GFR calc non Af Amer: 92 mL/min/{1.73_m2} (ref >59)
Glucose: 102 mg/dL — ABNORMAL HIGH (ref 65–99)
Potassium: 3.8 mmol/L (ref 3.5–5.2)
Sodium: 142 mmol/L (ref 134–144)

## 2019-03-19 LAB — CBC
Hematocrit: 44.4 % (ref 37.5–51.0)
Hemoglobin: 15.9 g/dL (ref 13.0–17.7)
MCH: 30.7 pg (ref 26.6–33.0)
MCHC: 35.8 g/dL — ABNORMAL HIGH (ref 31.5–35.7)
MCV: 86 fL (ref 79–97)
Platelets: 147 10*3/uL — ABNORMAL LOW (ref 150–450)
RBC: 5.18 x10E6/uL (ref 4.14–5.80)
RDW: 13.4 % (ref 11.6–15.4)
WBC: 4.5 10*3/uL (ref 3.4–10.8)

## 2019-03-27 ENCOUNTER — Other Ambulatory Visit: Payer: Self-pay | Admitting: Internal Medicine

## 2019-03-27 MED ORDER — METOPROLOL TARTRATE 50 MG PO TABS: ORAL_TABLET | ORAL | 3 refills | Status: DC

## 2019-07-10 ENCOUNTER — Other Ambulatory Visit: Payer: Self-pay | Admitting: Internal Medicine

## 2019-07-10 NOTE — Telephone Encounter (Signed)
Pt last saw Tommye Standard, [place] on 03/18/19, last labs 03/18/19 Creat 0.87, age 64, weight 99.8kg, based on specified criteria pt is on appropriate dosage of Eliquis 5mg  BID.  Will refill rx.

## 2020-01-10 ENCOUNTER — Other Ambulatory Visit: Payer: Self-pay | Admitting: Internal Medicine

## 2020-01-12 NOTE — Telephone Encounter (Signed)
Eliquis 5mg  refill request received. Patient is 64 years old, weight-99.8kg, Crea-0.87 on 03/18/2019, Diagnosis-Afib, and last seen by Tommye Standard on 03/18/2019. Dose is appropriate based on dosing criteria. Will send in refill to requested pharmacy.

## 2020-03-22 ENCOUNTER — Telehealth: Payer: Self-pay | Admitting: Pharmacist

## 2020-03-22 NOTE — Telephone Encounter (Signed)
Called pt and left message advising him that we are still waiting to hear back from his insurance about Eliquis coverage, and that if he needs a refill in the mean time, his copay card should continue to work at the pharmacy.

## 2020-04-08 ENCOUNTER — Telehealth: Payer: Self-pay | Admitting: Pharmacist

## 2020-04-08 ENCOUNTER — Other Ambulatory Visit: Payer: Self-pay | Admitting: Family Medicine

## 2020-04-08 DIAGNOSIS — M5412 Radiculopathy, cervical region: Secondary | ICD-10-CM

## 2020-04-08 MED ORDER — APIXABAN 5 MG PO TABS: 5.0000 mg | ORAL_TABLET | Freq: Two times a day (BID) | ORAL | 1 refills | Status: DC

## 2020-04-08 NOTE — Telephone Encounter (Signed)
Left message for pt that Eliquis appeals has been approved. Refill sent in to his pharmacy. He already has copay card.

## 2020-04-11 ENCOUNTER — Ambulatory Visit
Admission: RE | Admit: 2020-04-11 | Discharge: 2020-04-11 | Disposition: A | Discharge status: Home / Self Care | Payer: BC Managed Care – PPO | Source: Ambulatory Visit | Attending: Family Medicine | Admitting: Family Medicine

## 2020-04-11 ENCOUNTER — Other Ambulatory Visit: Payer: Self-pay

## 2020-04-11 DIAGNOSIS — M5412 Radiculopathy, cervical region: Secondary | ICD-10-CM

## 2020-04-11 MED ORDER — GADOBENATE DIMEGLUMINE 529 MG/ML IV SOLN
20.0000 mL | Freq: Once | INTRAVENOUS | Status: AC | PRN
  Administered 2020-04-11: 20 mL via INTRAVENOUS

## 2020-04-15 ENCOUNTER — Ambulatory Visit: Payer: BC Managed Care – PPO | Admitting: Neurology

## 2020-04-15 ENCOUNTER — Telehealth: Payer: Self-pay | Admitting: *Deleted

## 2020-04-15 ENCOUNTER — Other Ambulatory Visit: Payer: Self-pay | Admitting: Neurosurgery

## 2020-04-15 NOTE — Telephone Encounter (Signed)
   [facility] Pre-operative Risk Assessment    HEARTCARE STAFF: - Please ensure there is not already an duplicate clearance open for this procedure. - Under Visit Info/Reason for Call, type in Other and utilize the format Clearance MM/DD/YY or Clearance TBD. Do not use dashes or single digits. - If request is for dental extraction, please clarify the # of teeth to be extracted.  Request for surgical clearance: PER DR[facility]  1. What type of surgery is being performed? C5-6, C6-7 ANTERIOR CERVICAL FUSION   2. When is this surgery scheduled? 04/26/20   3. What type of clearance is required (medical clearance vs. Pharmacy clearance to hold med vs. Both)? BOTH  4. Are there any medications that need to be held prior to surgery and how long? ELIQUIS   5. Practice name and name of physician performing surgery? [facility]; DR. GARY CRAM   6. What is the office phone number? [phone]   7.   What is the office fax number? [place]: VANESSA  8.   Anesthesia type (None, local, MAC, general) ? GENERAL   Julaine Hua 04/15/2020, 11:44 AM  _________________________________________________________________   (provider comments below)

## 2020-04-15 NOTE — Telephone Encounter (Signed)
Please arrange follow up

## 2020-04-15 NOTE — Telephone Encounter (Signed)
Clinical pharmacist to review Eliquis 

## 2020-04-15 NOTE — Telephone Encounter (Signed)
Will send to pre op pharm-D pool

## 2020-04-15 NOTE — Telephone Encounter (Signed)
Patient with diagnosis of atrial fibrillation on Eliquis for anticoagulation.    Procedure: C5-6, C6-7 anterior cervical fusion Date of procedure: 04/26/20   CHA2DS2-VASc Score = 1  This indicates a 0.6% annual risk of stroke. The patient's score is based upon: CHF History: No HTN History: Yes Diabetes History: No Stroke History: No Vascular Disease History: No Age Score: 0 Gender Score: 0     CrCl 152.7 Platelet count 147  Per office protocol, patient can hold Eliquis for 3 days prior to procedure.    Patient will not need bridging with Lovenox (enoxaparin) around procedure.

## 2020-04-15 NOTE — Telephone Encounter (Addendum)
   Primary Cardiologist: Dr. Rayann Heman  Chart reviewed as part of pre-operative protocol coverage. Because of Patrick Gray's past medical history and time since last visit, he will require a follow-up visit in order to better assess preoperative cardiovascular risk.  Pre-op covering staff: - Please schedule appointment and call patient to inform them. If patient already had an upcoming appointment within acceptable timeframe, please add "pre-op clearance" to the appointment notes so provider is aware. - Please contact requesting surgeon's office via preferred method (i.e, phone, fax) to inform them of need for appointment prior to surgery.  If applicable, this message will also be routed to pharmacy pool and/or primary cardiologist for input on holding anticoagulant/antiplatelet agent as requested below so that this information is available to the clearing provider at time of patient's appointment.   [place], [place]  04/15/2020, 12:00 PM

## 2020-04-16 NOTE — Telephone Encounter (Signed)
Pt has been scheduled 04/21/20 with Dr. Rayann Heman for pre op. I will forward notes to MD for upcoming appt. Will send FYI to requesting office pt has appt 04/21/20.

## 2020-04-16 NOTE — Telephone Encounter (Signed)
Message has been sent to [place], [place] scheduler to reach to the pt with an appt to see Dr. Rayann Heman or EP APP.

## 2020-04-21 ENCOUNTER — Ambulatory Visit: Payer: BC Managed Care – PPO | Admitting: Internal Medicine

## 2020-04-21 ENCOUNTER — Other Ambulatory Visit: Payer: Self-pay

## 2020-04-21 ENCOUNTER — Encounter: Payer: Self-pay | Admitting: Internal Medicine

## 2020-04-21 VITALS — BP 124/84 | HR 83 | Ht 71.0 in | Wt 229.4 lb

## 2020-04-21 DIAGNOSIS — I43 Cardiomyopathy in diseases classified elsewhere: Secondary | ICD-10-CM

## 2020-04-21 DIAGNOSIS — I10 Essential (primary) hypertension: Secondary | ICD-10-CM | POA: Diagnosis not present

## 2020-04-21 DIAGNOSIS — I480 Paroxysmal atrial fibrillation: Secondary | ICD-10-CM

## 2020-04-21 DIAGNOSIS — R000 Tachycardia, unspecified: Secondary | ICD-10-CM

## 2020-04-21 NOTE — Progress Notes (Signed)
PCP: [name], MD   Primary EP: Dr Marijo Conception is a 65 y.o. male who presents today for routine electrophysiology followup.  Since last being seen in our clinic, the patient reports doing very well.  He fell recently and has a neck injury.  He reports leg weakness and is told that he needs to have surgery urgently.  He is in afib today but unaware.  + minor edema.  Today, he denies symptoms of palpitations, chest pain, shortness of breath,  dizziness, presyncope, or syncope.  The patient is otherwise without complaint today.   Past Medical History:  Diagnosis Date   Anxiety    Deafness in left ear    Dysrhythmia    H/O cardiac radiofrequency ablation    HTN (hypertension)    Hypercholesterolemia    Hypopotassemia    Paroxysmal atrial fibrillation (HCC)    Tremor    Past Surgical History:  Procedure Laterality Date   ACOUSTIC NEUROMA RESECTION Left 19990   APPENDECTOMY  1976   ATRIAL FIBRILLATION ABLATION N/A 12/19/2016   Procedure: ATRIAL FIBRILLATION ABLATION;  Surgeon: Thompson Grayer, MD;  Location: [place] CV LAB;  Service: Cardiovascular;  Laterality: N/A;   CARDIAC CATHETERIZATION N/A 10/07/2015   Procedure: Left Heart Cath and Coronary Angiography;  Surgeon: Josue Hector, MD;  Location: [place] CV LAB;  Service: Cardiovascular;  Laterality: N/A;   CARDIOVERSION N/A 06/17/2013   Procedure: CARDIOVERSION;  Surgeon: Josue Hector, MD;  Location: [place];  Service: Cardiovascular;  Laterality: N/A;   CARDIOVERSION N/A 05/11/2016   Procedure: CARDIOVERSION;  Surgeon: Sanda Klein, MD;  Location: [place] ENDOSCOPY;  Service: Cardiovascular;  Laterality: N/A;   CARDIOVERSION N/A 05/24/2017   Procedure: CARDIOVERSION;  Surgeon: Sueanne Margarita, MD;  Location: [facility] ENDOSCOPY;  Service: Cardiovascular;  Laterality: N/A;   CARDIOVERSION N/A 11/19/2017   Procedure: CARDIOVERSION;  Surgeon: Pixie Casino, MD;  Location: [facility] ENDOSCOPY;  Service:  Cardiovascular;  Laterality: N/A;   [place] N/A 11/30/2015   Procedure: Atrial Fibrillation Ablation;  Surgeon: Thompson Grayer, MD;  Location: [place] CV LAB;  Service: Cardiovascular;  Laterality: N/A;   TEE WITHOUT CARDIOVERSION N/A 05/11/2016   Procedure: TRANSESOPHAGEAL ECHOCARDIOGRAM (TEE);  Surgeon: Sanda Klein, MD;  Location: [place];  Service: Cardiovascular;  Laterality: N/A;   [place] Left 06/15/2016   Procedure: TOTAL SHOULDER ARTHROPLASTY;  Surgeon: Tania Ade, MD;  Location: [place];  Service: Orthopedics;  Laterality: Left;  Left total shoulder arthroplasty    ROS- all systems are reviewed and negatives except as per HPI above  Current Outpatient Medications  Medication Sig Dispense Refill   apixaban (ELIQUIS) 5 MG TABS tablet Take 1 tablet (5 mg total) by mouth 2 (two) times daily. 180 tablet 1   atorvastatin (LIPITOR) 20 MG tablet Take 20 mg by mouth every evening.      Cholecalciferol (VITAMIN D-3) 5000 units TABS Take 5,000 Units by mouth daily.     Coenzyme Q10 (COQ10) 100 MG CAPS Take 100 mg by mouth daily.     HYDROcodone-acetaminophen (NORCO/VICODIN) 5-325 MG tablet Take 1-2 tablets by mouth every 6 (six) hours as needed (pain).     ibuprofen (ADVIL,MOTRIN) 200 MG tablet Take 400-600 mg by mouth daily as needed (for pain).     lisinopril-hydrochlorothiazide (PRINZIDE,ZESTORETIC) 20-25 MG per tablet Take 1 tablet by mouth daily.  metoprolol tartrate (LOPRESSOR) 50 MG tablet TAKE 1 TABLET BY MOUTH TWICE DAILY MAY  TAKE  AN  EXTRA  ONE-HALF  TABLET  AS  NEEDED  FOR  BREAKTHROUGH  AFIB (Patient taking differently: TAKE 1 TABLET BY MOUTH TWICE DAILY MAY  TAKE  AN  EXTRA  ONE-HALF  TABLET  AS  NEEDED  FOR  BREAKTHROUGH  AFIB) 200 tablet 3   Multiple Vitamins-Minerals (ZINC PO) Take 1 tablet by mouth daily.     potassium chloride SA  (K-DUR) 20 MEQ tablet Take 1 tablet (20 meq) by mouth in the morning & take 2 tablets (40 meq) by mouth in the evening. 90 tablet 6   QUERCETIN PO Take 1 capsule by mouth daily.     sildenafil (REVATIO) 20 MG tablet Take 20-100 mg by mouth daily as needed for erectile dysfunction.  0   No current facility-administered medications for this visit.    Physical Exam: Vitals:   04/21/20 0823  BP: 124/84  Pulse: 83  SpO2: 97%  Weight: 229 lb 6.4 oz (104.1 kg)  Height: 5\' 11"  (1.803 m)    GEN- The patient is well appearing, alert and oriented x 3 today.   Head- normocephalic, atraumatic Eyes-  Sclera clear, conjunctiva pink Ears- hearing intact Oropharynx- clear Lungs-  normal work of breathing Heart- irregular rate and rhythm  GI- soft  Extremities- no clubbing, cyanosis, +1 edema  Wt Readings from Last 3 Encounters:  04/21/20 229 lb 6.4 oz (104.1 kg)  03/18/19 220 lb (99.8 kg)  08/14/18 224 lb 12.8 oz (102 kg)    EKG tracing ordered today is personally reviewed and shows afjib, V rate 83  Assessment and Plan:   1. Persistent atrial fibrillation AF has been previously well controlled chads2vascs score is 1.  He is on eliquis in afib today but unaware Rate controlled Will follow-up in AF clinic 4 weeks after neck surgery to consider elective cardioversion  2. HTN Stable No change required today  3. Edema Echo on follow-up in AF clinic  4. preop No ischemic symptoms Ok to proceed with neck surgery urgently Hold eliquis 3 days prior to surgery and resume as soon as able post operatively   Risks, benefits and potential toxicities for medications prescribed and/or refilled reviewed with patient today.   AF clinic in 5 weeks.  Thompson Grayer MD, [facility] 04/21/2020 9:00 AM

## 2020-04-21 NOTE — Patient Instructions (Addendum)
Medication Instructions:  Hold your Eliquis for 3 days prior to your neck surgery. Last dose 04/22/20 Your physician recommends that you continue on your current medications as directed. Please refer to the Current Medication list given to you today.  Labwork: None ordered.  Testing/Procedures: None ordered.  Follow-Up: Your physician wants you to follow-up in: 5 weeks in the Afib Clinic. They will contact you.    Any Other Special Instructions Will Be Listed Below (If Applicable).  If you need a refill on your cardiac medications before your next appointment, please call your pharmacy.

## 2020-04-23 ENCOUNTER — Other Ambulatory Visit (HOSPITAL_COMMUNITY)
Admission: RE | Admit: 2020-04-23 | Discharge: 2020-04-23 | Disposition: A | Discharge status: Home / Self Care | Payer: BC Managed Care – PPO | Source: Ambulatory Visit | Attending: Neurosurgery | Admitting: Neurosurgery

## 2020-04-23 ENCOUNTER — Encounter (HOSPITAL_COMMUNITY): Payer: Self-pay | Admitting: Neurosurgery

## 2020-04-23 ENCOUNTER — Other Ambulatory Visit: Payer: Self-pay

## 2020-04-23 DIAGNOSIS — Z20822 Contact with and (suspected) exposure to covid-19: Secondary | ICD-10-CM | POA: Insufficient documentation

## 2020-04-23 DIAGNOSIS — Z01812 Encounter for preprocedural laboratory examination: Secondary | ICD-10-CM | POA: Diagnosis present

## 2020-04-23 LAB — SARS CORONAVIRUS 2 (TAT 6-24 HRS): SARS Coronavirus 2: NEGATIVE

## 2020-04-23 NOTE — Progress Notes (Addendum)
[name] denies chest pain or shortness of breath. Patient denies any s/s of Covid in his household and has not been in contact with anyone with s/s in over 10 days.  [name] will be tested for Covid this afternoon and will quarantine until surgery Monday with his family.  Dr.eesd to sign orders, I sent a staff message to Dr. Saintclair Halsted.  I called Lorriane Shire at Dr. Windy Carina office and asked if patient is ordered to be NPO after MN or is it per anesthesiology; I explained on the call that anesthesiologist allow patients to have clear liquids until 3 hours prior to surgery. Patients are not allowed food after midnight.  I notified [name] that surgery time as been changed and that he needs to arrive at [place] and will need to be NPO after midnight.

## 2020-04-26 ENCOUNTER — Ambulatory Visit (HOSPITAL_COMMUNITY): Payer: BC Managed Care – PPO | Admitting: Anesthesiology

## 2020-04-26 ENCOUNTER — Encounter (HOSPITAL_COMMUNITY): Payer: Self-pay | Admitting: Neurosurgery

## 2020-04-26 ENCOUNTER — Encounter (HOSPITAL_COMMUNITY)
Admission: RE | Disposition: A | Discharge status: Home Health | Payer: Self-pay | Source: Home / Self Care | Attending: Neurosurgery

## 2020-04-26 ENCOUNTER — Ambulatory Visit (HOSPITAL_COMMUNITY): Payer: BC Managed Care – PPO

## 2020-04-26 ENCOUNTER — Other Ambulatory Visit: Payer: Self-pay

## 2020-04-26 ENCOUNTER — Inpatient Hospital Stay (HOSPITAL_COMMUNITY)
Admission: RE | Admit: 2020-04-26 | Discharge: 2020-05-03 | DRG: 472 | Disposition: A | Discharge status: Home Health | Payer: BC Managed Care – PPO | Attending: Neurosurgery | Admitting: Neurosurgery

## 2020-04-26 DIAGNOSIS — Z823 Family history of stroke: Secondary | ICD-10-CM

## 2020-04-26 DIAGNOSIS — Z8249 Family history of ischemic heart disease and other diseases of the circulatory system: Secondary | ICD-10-CM

## 2020-04-26 DIAGNOSIS — N319 Neuromuscular dysfunction of bladder, unspecified: Secondary | ICD-10-CM | POA: Diagnosis not present

## 2020-04-26 DIAGNOSIS — G250 Essential tremor: Secondary | ICD-10-CM | POA: Diagnosis present

## 2020-04-26 DIAGNOSIS — R251 Tremor, unspecified: Secondary | ICD-10-CM

## 2020-04-26 DIAGNOSIS — Z83438 Family history of other disorder of lipoprotein metabolism and other lipidemia: Secondary | ICD-10-CM

## 2020-04-26 DIAGNOSIS — I480 Paroxysmal atrial fibrillation: Secondary | ICD-10-CM | POA: Diagnosis present

## 2020-04-26 DIAGNOSIS — M50022 Cervical disc disorder at C5-C6 level with myelopathy: Secondary | ICD-10-CM | POA: Diagnosis present

## 2020-04-26 DIAGNOSIS — I10 Essential (primary) hypertension: Secondary | ICD-10-CM | POA: Diagnosis present

## 2020-04-26 DIAGNOSIS — Z419 Encounter for procedure for purposes other than remedying health state, unspecified: Secondary | ICD-10-CM

## 2020-04-26 DIAGNOSIS — Z880 Allergy status to penicillin: Secondary | ICD-10-CM

## 2020-04-26 DIAGNOSIS — G959 Disease of spinal cord, unspecified: Secondary | ICD-10-CM | POA: Diagnosis present

## 2020-04-26 DIAGNOSIS — M1990 Unspecified osteoarthritis, unspecified site: Secondary | ICD-10-CM | POA: Diagnosis present

## 2020-04-26 DIAGNOSIS — Z87891 Personal history of nicotine dependence: Secondary | ICD-10-CM

## 2020-04-26 DIAGNOSIS — Z7901 Long term (current) use of anticoagulants: Secondary | ICD-10-CM

## 2020-04-26 DIAGNOSIS — D333 Benign neoplasm of cranial nerves: Secondary | ICD-10-CM | POA: Diagnosis present

## 2020-04-26 DIAGNOSIS — M4712 Other spondylosis with myelopathy, cervical region: Secondary | ICD-10-CM | POA: Diagnosis present

## 2020-04-26 DIAGNOSIS — Z79899 Other long term (current) drug therapy: Secondary | ICD-10-CM

## 2020-04-26 DIAGNOSIS — H9192 Unspecified hearing loss, left ear: Secondary | ICD-10-CM | POA: Diagnosis present

## 2020-04-26 DIAGNOSIS — M50023 Cervical disc disorder at C6-C7 level with myelopathy: Principal | ICD-10-CM | POA: Diagnosis present

## 2020-04-26 DIAGNOSIS — E7800 Pure hypercholesterolemia, unspecified: Secondary | ICD-10-CM | POA: Diagnosis present

## 2020-04-26 HISTORY — DX: Unspecified osteoarthritis, unspecified site: M19.90

## 2020-04-26 HISTORY — PX: ANTERIOR CERVICAL DECOMP/DISCECTOMY FUSION: SHX1161

## 2020-04-26 LAB — CBC
HCT: 37.2 % — ABNORMAL LOW (ref 39.0–52.0)
Hemoglobin: 13.1 g/dL (ref 13.0–17.0)
MCH: 32.3 pg (ref 26.0–34.0)
MCHC: 35.2 g/dL (ref 30.0–36.0)
MCV: 91.9 fL (ref 80.0–100.0)
Platelets: 165 10*3/uL (ref 150–400)
RBC: 4.05 MIL/uL — ABNORMAL LOW (ref 4.22–5.81)
RDW: 13.5 % (ref 11.5–15.5)
WBC: 4.3 10*3/uL (ref 4.0–10.5)
nRBC: 0 % (ref 0.0–0.2)

## 2020-04-26 LAB — COMPREHENSIVE METABOLIC PANEL
ALT: 84 U/L — ABNORMAL HIGH (ref 0–44)
AST: 71 U/L — ABNORMAL HIGH (ref 15–41)
Albumin: 4 g/dL (ref 3.5–5.0)
Alkaline Phosphatase: 58 U/L (ref 38–126)
Anion gap: 11 (ref 5–15)
BUN: 19 mg/dL (ref 8–23)
CO2: 24 mmol/L (ref 22–32)
Calcium: 9.5 mg/dL (ref 8.9–10.3)
Chloride: 103 mmol/L (ref 98–111)
Creatinine, Ser: 0.74 mg/dL (ref 0.61–1.24)
GFR, Estimated: >60 mL/min (ref >60)
Glucose, Bld: 100 mg/dL — ABNORMAL HIGH (ref 70–99)
Potassium: 3.3 mmol/L — ABNORMAL LOW (ref 3.5–5.1)
Sodium: 138 mmol/L (ref 135–145)
Total Bilirubin: 1.5 mg/dL — ABNORMAL HIGH (ref 0.3–1.2)
Total Protein: 6.4 g/dL — ABNORMAL LOW (ref 6.5–8.1)

## 2020-04-26 SURGERY — ANTERIOR CERVICAL DECOMPRESSION/DISCECTOMY FUSION 2 LEVELS
Anesthesia: General

## 2020-04-26 MED ORDER — ACETAMINOPHEN 10 MG/ML IV SOLN: 1000.0000 mg | Freq: Once | INTRAVENOUS | Status: DC | PRN

## 2020-04-26 MED ORDER — DEXAMETHASONE SODIUM PHOSPHATE 10 MG/ML IJ SOLN
10.0000 mg | Freq: Once | INTRAMUSCULAR | Status: AC
  Administered 2020-04-26: 10 mg via INTRAVENOUS

## 2020-04-26 MED ORDER — SUFENTANIL CITRATE 50 MCG/ML IV SOLN
INTRAVENOUS | Status: DC | PRN
  Administered 2020-04-26 (×2): 10 ug via INTRAVENOUS

## 2020-04-26 MED ORDER — DEXAMETHASONE SODIUM PHOSPHATE 10 MG/ML IJ SOLN
INTRAMUSCULAR | Status: AC
  Filled 2020-04-26: qty 1

## 2020-04-26 MED ORDER — ADULT MULTIVITAMIN W/MINERALS CH
1.0000 | ORAL_TABLET | Freq: Every day | ORAL | Status: DC
  Administered 2020-04-27 – 2020-05-03 (×6): 1 via ORAL
  Filled 2020-04-26 (×8): qty 1

## 2020-04-26 MED ORDER — ORAL CARE MOUTH RINSE: 15.0000 mL | Freq: Once | OROMUCOSAL | Status: AC

## 2020-04-26 MED ORDER — HYDROCODONE-ACETAMINOPHEN 5-325 MG PO TABS
ORAL_TABLET | ORAL | Status: AC
  Filled 2020-04-26: qty 1

## 2020-04-26 MED ORDER — PHENYLEPHRINE 40 MCG/ML (10ML) SYRINGE FOR IV PUSH (FOR BLOOD PRESSURE SUPPORT)
PREFILLED_SYRINGE | INTRAVENOUS | Status: DC | PRN
  Administered 2020-04-26: 80 ug via INTRAVENOUS

## 2020-04-26 MED ORDER — ACETAMINOPHEN 10 MG/ML IV SOLN
INTRAVENOUS | Status: AC
  Filled 2020-04-26: qty 100

## 2020-04-26 MED ORDER — PROPOFOL 10 MG/ML IV BOLUS
INTRAVENOUS | Status: AC
  Filled 2020-04-26: qty 20

## 2020-04-26 MED ORDER — ROCURONIUM BROMIDE 10 MG/ML (PF) SYRINGE
PREFILLED_SYRINGE | INTRAVENOUS | Status: AC
  Filled 2020-04-26: qty 10

## 2020-04-26 MED ORDER — ONDANSETRON HCL 4 MG/2ML IJ SOLN
INTRAMUSCULAR | Status: AC
  Filled 2020-04-26: qty 2

## 2020-04-26 MED ORDER — 0.9 % SODIUM CHLORIDE (POUR BTL) OPTIME
TOPICAL | Status: DC | PRN
  Administered 2020-04-26 (×2): 1000 mL

## 2020-04-26 MED ORDER — LACTATED RINGERS IV SOLN: INTRAVENOUS | Status: DC | PRN

## 2020-04-26 MED ORDER — METOPROLOL TARTRATE 50 MG PO TABS
50.0000 mg | ORAL_TABLET | Freq: Two times a day (BID) | ORAL | Status: DC
Start: 2020-04-26 — End: 2020-05-03
  Administered 2020-04-27 – 2020-05-03 (×12): 50 mg via ORAL
  Filled 2020-04-26 (×15): qty 1

## 2020-04-26 MED ORDER — MIDAZOLAM HCL 2 MG/2ML IJ SOLN
INTRAMUSCULAR | Status: AC
  Filled 2020-04-26: qty 2

## 2020-04-26 MED ORDER — ONDANSETRON HCL 4 MG PO TABS: 4.0000 mg | ORAL_TABLET | Freq: Four times a day (QID) | ORAL | Status: DC | PRN

## 2020-04-26 MED ORDER — IBUPROFEN 400 MG PO TABS
400.0000 mg | ORAL_TABLET | Freq: Every day | ORAL | Status: DC | PRN
  Filled 2020-04-26: qty 2

## 2020-04-26 MED ORDER — ONDANSETRON HCL 4 MG/2ML IJ SOLN
INTRAMUSCULAR | Status: DC | PRN
  Administered 2020-04-26: 4 mg via INTRAVENOUS

## 2020-04-26 MED ORDER — VITAMIN D 25 MCG (1000 UNIT) PO TABS
5000.0000 [IU] | ORAL_TABLET | Freq: Every day | ORAL | Status: DC
  Administered 2020-04-27 – 2020-05-03 (×6): 5000 [IU] via ORAL
  Filled 2020-04-26 (×8): qty 5

## 2020-04-26 MED ORDER — ONDANSETRON HCL 4 MG/2ML IJ SOLN: 4.0000 mg | Freq: Four times a day (QID) | INTRAMUSCULAR | Status: DC | PRN

## 2020-04-26 MED ORDER — ACETAMINOPHEN 650 MG RE SUPP: 650.0000 mg | RECTAL | Status: DC | PRN

## 2020-04-26 MED ORDER — HYDROMORPHONE HCL 1 MG/ML IJ SOLN
0.2500 mg | INTRAMUSCULAR | Status: DC | PRN
Start: 2020-04-26 — End: 2020-04-26
  Administered 2020-04-26: 0.5 mg via INTRAVENOUS

## 2020-04-26 MED ORDER — HYDROMORPHONE HCL 1 MG/ML IJ SOLN
INTRAMUSCULAR | Status: AC
  Filled 2020-04-26: qty 1

## 2020-04-26 MED ORDER — THROMBIN 5000 UNITS EX SOLR
OROMUCOSAL | Status: DC | PRN
  Administered 2020-04-26: 5 mL via TOPICAL

## 2020-04-26 MED ORDER — VANCOMYCIN HCL 1000 MG/200ML IV SOLN
1000.0000 mg | Freq: Two times a day (BID) | INTRAVENOUS | Status: DC
  Administered 2020-04-27 – 2020-04-28 (×3): 1000 mg via INTRAVENOUS
  Filled 2020-04-26 (×6): qty 200

## 2020-04-26 MED ORDER — SUFENTANIL CITRATE 50 MCG/ML IV SOLN
INTRAVENOUS | Status: AC
  Filled 2020-04-26: qty 1

## 2020-04-26 MED ORDER — ROCURONIUM BROMIDE 10 MG/ML (PF) SYRINGE
PREFILLED_SYRINGE | INTRAVENOUS | Status: DC | PRN
  Administered 2020-04-26: 20 mg via INTRAVENOUS
  Administered 2020-04-26: 100 mg via INTRAVENOUS

## 2020-04-26 MED ORDER — SODIUM CHLORIDE (PF) 0.9 % IJ SOLN
INTRAMUSCULAR | Status: AC
  Filled 2020-04-26: qty 10

## 2020-04-26 MED ORDER — THROMBIN 5000 UNITS EX SOLR
CUTANEOUS | Status: DC | PRN
  Administered 2020-04-26 (×2): 5000 [IU] via TOPICAL

## 2020-04-26 MED ORDER — VANCOMYCIN HCL 1500 MG/300ML IV SOLN
1500.0000 mg | INTRAVENOUS | Status: AC
  Administered 2020-04-26: 1500 mg via INTRAVENOUS
  Filled 2020-04-26: qty 300

## 2020-04-26 MED ORDER — DEXMEDETOMIDINE (PRECEDEX) IN NS 20 MCG/5ML (4 MCG/ML) IV SYRINGE
PREFILLED_SYRINGE | INTRAVENOUS | Status: AC
  Filled 2020-04-26: qty 5

## 2020-04-26 MED ORDER — HYDROCODONE-ACETAMINOPHEN 5-325 MG PO TABS
1.0000 | ORAL_TABLET | Freq: Four times a day (QID) | ORAL | Status: DC | PRN
  Administered 2020-04-26 – 2020-04-27 (×2): 1 via ORAL
  Administered 2020-04-28 – 2020-05-02 (×9): 2 via ORAL
  Filled 2020-04-26 (×2): qty 2
  Filled 2020-04-26: qty 1
  Filled 2020-04-26 (×8): qty 2

## 2020-04-26 MED ORDER — CHLORHEXIDINE GLUCONATE 0.12 % MT SOLN
15.0000 mL | Freq: Once | OROMUCOSAL | Status: AC
  Administered 2020-04-26: 15 mL via OROMUCOSAL

## 2020-04-26 MED ORDER — LABETALOL HCL 5 MG/ML IV SOLN
10.0000 mg | Freq: Once | INTRAVENOUS | Status: AC
  Administered 2020-04-26: 10 mg via INTRAVENOUS

## 2020-04-26 MED ORDER — SILDENAFIL CITRATE 20 MG PO TABS: 20.0000 mg | ORAL_TABLET | Freq: Every day | ORAL | Status: DC | PRN

## 2020-04-26 MED ORDER — QUERCETIN 50 MG PO TABS: ORAL_TABLET | Freq: Every day | ORAL | Status: DC

## 2020-04-26 MED ORDER — HEMOSTATIC AGENTS (NO CHARGE) OPTIME
TOPICAL | Status: DC | PRN
  Administered 2020-04-26: 1 via TOPICAL

## 2020-04-26 MED ORDER — ZINC PO LOZG: LOZENGE | Freq: Every day | ORAL | Status: DC

## 2020-04-26 MED ORDER — ACETAMINOPHEN 325 MG PO TABS
650.0000 mg | ORAL_TABLET | ORAL | Status: DC | PRN
  Administered 2020-04-27: 650 mg via ORAL
  Filled 2020-04-26: qty 2

## 2020-04-26 MED ORDER — MENTHOL 3 MG MT LOZG: 1.0000 | LOZENGE | OROMUCOSAL | Status: DC | PRN

## 2020-04-26 MED ORDER — CYCLOBENZAPRINE HCL 10 MG PO TABS
10.0000 mg | ORAL_TABLET | Freq: Three times a day (TID) | ORAL | Status: DC | PRN
  Administered 2020-04-26 – 2020-05-02 (×10): 10 mg via ORAL
  Filled 2020-04-26 (×11): qty 1

## 2020-04-26 MED ORDER — LACTATED RINGERS IV SOLN: INTRAVENOUS | Status: DC

## 2020-04-26 MED ORDER — ALUM & MAG HYDROXIDE-SIMETH 200-200-20 MG/5ML PO SUSP: 30.0000 mL | Freq: Four times a day (QID) | ORAL | Status: DC | PRN

## 2020-04-26 MED ORDER — ATORVASTATIN CALCIUM 10 MG PO TABS
20.0000 mg | ORAL_TABLET | Freq: Every evening | ORAL | Status: DC
  Administered 2020-04-27 – 2020-05-01 (×5): 20 mg via ORAL
  Filled 2020-04-26 (×7): qty 2

## 2020-04-26 MED ORDER — HYDROMORPHONE HCL 1 MG/ML IJ SOLN
0.5000 mg | INTRAMUSCULAR | Status: DC | PRN
Start: 2020-04-26 — End: 2020-05-03
  Administered 2020-04-28: 0.5 mg via INTRAVENOUS
  Filled 2020-04-26: qty 1

## 2020-04-26 MED ORDER — LISINOPRIL 20 MG PO TABS
20.0000 mg | ORAL_TABLET | Freq: Every day | ORAL | Status: DC
  Administered 2020-04-28 – 2020-05-03 (×5): 20 mg via ORAL
  Filled 2020-04-26 (×8): qty 1

## 2020-04-26 MED ORDER — PROPOFOL 10 MG/ML IV BOLUS
INTRAVENOUS | Status: DC | PRN
  Administered 2020-04-26: 120 mg via INTRAVENOUS

## 2020-04-26 MED ORDER — POTASSIUM CHLORIDE CRYS ER 20 MEQ PO TBCR
20.0000 meq | EXTENDED_RELEASE_TABLET | Freq: Two times a day (BID) | ORAL | Status: DC
  Administered 2020-04-27 – 2020-05-03 (×12): 20 meq via ORAL
  Filled 2020-04-26 (×15): qty 1

## 2020-04-26 MED ORDER — HALOPERIDOL LACTATE 5 MG/ML IJ SOLN
2.5000 mg | Freq: Once | INTRAMUSCULAR | Status: AC
  Administered 2020-04-26: 2.5 mg via INTRAVENOUS

## 2020-04-26 MED ORDER — THROMBIN 5000 UNITS EX KIT
PACK | CUTANEOUS | Status: AC
  Filled 2020-04-26: qty 3

## 2020-04-26 MED ORDER — HALOPERIDOL LACTATE 5 MG/ML IJ SOLN
2.5000 mg | Freq: Once | INTRAMUSCULAR | Status: DC
  Filled 2020-04-26: qty 0.5

## 2020-04-26 MED ORDER — CYCLOBENZAPRINE HCL 10 MG PO TABS
ORAL_TABLET | ORAL | Status: AC
  Filled 2020-04-26: qty 1

## 2020-04-26 MED ORDER — VANCOMYCIN HCL IN DEXTROSE 1-5 GM/200ML-% IV SOLN
INTRAVENOUS | Status: AC
  Filled 2020-04-26: qty 200

## 2020-04-26 MED ORDER — COQ10 100 MG PO CAPS: 100.0000 mg | ORAL_CAPSULE | Freq: Every day | ORAL | Status: DC

## 2020-04-26 MED ORDER — SUGAMMADEX SODIUM 200 MG/2ML IV SOLN
INTRAVENOUS | Status: DC | PRN
  Administered 2020-04-26: 200 mg via INTRAVENOUS

## 2020-04-26 MED ORDER — LABETALOL HCL 5 MG/ML IV SOLN
INTRAVENOUS | Status: AC
  Filled 2020-04-26: qty 4

## 2020-04-26 MED ORDER — PANTOPRAZOLE SODIUM 40 MG IV SOLR
40.0000 mg | Freq: Every day | INTRAVENOUS | Status: DC
  Administered 2020-04-27: 40 mg via INTRAVENOUS
  Filled 2020-04-26 (×2): qty 40

## 2020-04-26 MED ORDER — CHLORHEXIDINE GLUCONATE CLOTH 2 % EX PADS: 6.0000 | MEDICATED_PAD | Freq: Once | CUTANEOUS | Status: DC

## 2020-04-26 MED ORDER — LIDOCAINE 2% (20 MG/ML) 5 ML SYRINGE
INTRAMUSCULAR | Status: DC | PRN
  Administered 2020-04-26: 100 mg via INTRAVENOUS

## 2020-04-26 MED ORDER — PHENYLEPHRINE HCL-NACL 10-0.9 MG/250ML-% IV SOLN
INTRAVENOUS | Status: DC | PRN
  Administered 2020-04-26: 50 ug/min via INTRAVENOUS

## 2020-04-26 MED ORDER — HYDROCHLOROTHIAZIDE 25 MG PO TABS
25.0000 mg | ORAL_TABLET | Freq: Every day | ORAL | Status: DC
  Administered 2020-04-27 – 2020-05-03 (×7): 25 mg via ORAL
  Filled 2020-04-26 (×9): qty 1

## 2020-04-26 MED ORDER — KETAMINE HCL 50 MG/5ML IJ SOSY
PREFILLED_SYRINGE | INTRAMUSCULAR | Status: AC
  Filled 2020-04-26: qty 5

## 2020-04-26 MED ORDER — PHENOL 1.4 % MT LIQD: 1.0000 | OROMUCOSAL | Status: DC | PRN

## 2020-04-26 MED ORDER — SODIUM CHLORIDE 0.9% FLUSH: 3.0000 mL | INTRAVENOUS | Status: DC | PRN

## 2020-04-26 MED ORDER — OXYCODONE HCL 5 MG PO TABS
10.0000 mg | ORAL_TABLET | ORAL | Status: DC | PRN
Start: 2020-04-26 — End: 2020-05-03
  Administered 2020-04-26 – 2020-05-03 (×19): 10 mg via ORAL
  Filled 2020-04-26 (×21): qty 2

## 2020-04-26 MED ORDER — ACETAMINOPHEN 10 MG/ML IV SOLN
INTRAVENOUS | Status: DC | PRN
  Administered 2020-04-26: 1000 mg via INTRAVENOUS

## 2020-04-26 MED ORDER — DEXMEDETOMIDINE (PRECEDEX) IN NS 20 MCG/5ML (4 MCG/ML) IV SYRINGE
PREFILLED_SYRINGE | INTRAVENOUS | Status: DC | PRN
  Administered 2020-04-26: 20 ug via INTRAVENOUS

## 2020-04-26 MED ORDER — FENTANYL CITRATE (PF) 250 MCG/5ML IJ SOLN
INTRAMUSCULAR | Status: AC
  Filled 2020-04-26: qty 5

## 2020-04-26 MED ORDER — SODIUM CHLORIDE 0.9% FLUSH
3.0000 mL | Freq: Two times a day (BID) | INTRAVENOUS | Status: DC
  Administered 2020-04-27 – 2020-05-03 (×11): 3 mL via INTRAVENOUS

## 2020-04-26 MED ORDER — MIDAZOLAM HCL 2 MG/2ML IJ SOLN
INTRAMUSCULAR | Status: DC | PRN
  Administered 2020-04-26: 2 mg via INTRAVENOUS

## 2020-04-26 MED ORDER — SODIUM CHLORIDE 0.9 % IV SOLN: 250.0000 mL | INTRAVENOUS | Status: DC

## 2020-04-26 MED ORDER — LISINOPRIL-HYDROCHLOROTHIAZIDE 20-25 MG PO TABS: 1.0000 | ORAL_TABLET | Freq: Every day | ORAL | Status: DC

## 2020-04-26 SURGICAL SUPPLY — 58 items
BAND RUBBER #18 3X1/16 STRL (MISCELLANEOUS) ×4 IMPLANT
BASKET BONE COLLECTION (BASKET) ×2 IMPLANT
BENZOIN TINCTURE PRP APPL 2/3 (GAUZE/BANDAGES/DRESSINGS) ×2 IMPLANT
BIT DRILL NEURO 2X3.1 SFT TUCH (MISCELLANEOUS) ×1 IMPLANT
BONE VIVIGEN FORMABLE 1.3CC (Bone Implant) ×4 IMPLANT
BUR MATCHSTICK NEURO 3.0 LAGG (BURR) ×2 IMPLANT
CANISTER SUCT 3000ML PPV (MISCELLANEOUS) ×2 IMPLANT
CARTRIDGE OIL MAESTRO DRILL (MISCELLANEOUS) ×1 IMPLANT
COVER WAND RF STERILE (DRAPES) ×2 IMPLANT
DERMABOND ADVANCED (GAUZE/BANDAGES/DRESSINGS) ×1
DERMABOND ADVANCED .7 DNX12 (GAUZE/BANDAGES/DRESSINGS) ×1 IMPLANT
DIFFUSER DRILL AIR PNEUMATIC (MISCELLANEOUS) ×2 IMPLANT
DRAPE C-ARM 42X72 X-RAY (DRAPES) ×4 IMPLANT
DRAPE LAPAROTOMY 100X72 PEDS (DRAPES) ×2 IMPLANT
DRAPE MICROSCOPE LEICA (MISCELLANEOUS) ×2 IMPLANT
DRILL NEURO 2X3.1 SOFT TOUCH (MISCELLANEOUS) ×2
DRSG OPSITE POSTOP 4X6 (GAUZE/BANDAGES/DRESSINGS) ×2 IMPLANT
DURAPREP 6ML APPLICATOR 50/CS (WOUND CARE) ×2 IMPLANT
ELECT COATED BLADE 2.86 ST (ELECTRODE) ×2 IMPLANT
ELECT REM PT RETURN 9FT ADLT (ELECTROSURGICAL) ×2
ELECTRODE REM PT RTRN 9FT ADLT (ELECTROSURGICAL) ×1 IMPLANT
GAUZE 4X4 16PLY RFD (DISPOSABLE) IMPLANT
GAUZE SPONGE 4X4 12PLY STRL (GAUZE/BANDAGES/DRESSINGS) ×2 IMPLANT
GLOVE BIO SURGEON STRL SZ7 (GLOVE) ×2 IMPLANT
GLOVE BIO SURGEON STRL SZ8 (GLOVE) ×4 IMPLANT
GLOVE EXAM NITRILE XL STR (GLOVE) IMPLANT
GLOVE INDICATOR 8.5 STRL (GLOVE) ×2 IMPLANT
GLOVE SURG POLYISO LF SZ7 (GLOVE) ×6 IMPLANT
GLOVE SURG UNDER POLY LF SZ7 (GLOVE) ×2 IMPLANT
GLOVE SURG UNDER POLY LF SZ7.5 (GLOVE) ×2 IMPLANT
GOWN STRL REUS W/ TWL LRG LVL3 (GOWN DISPOSABLE) ×2 IMPLANT
GOWN STRL REUS W/ TWL XL LVL3 (GOWN DISPOSABLE) ×1 IMPLANT
GOWN STRL REUS W/TWL 2XL LVL3 (GOWN DISPOSABLE) IMPLANT
GOWN STRL REUS W/TWL LRG LVL3 (GOWN DISPOSABLE) ×4
GOWN STRL REUS W/TWL XL LVL3 (GOWN DISPOSABLE) ×2
HALTER HD/CHIN CERV TRACTION D (MISCELLANEOUS) ×2 IMPLANT
HEMOSTAT POWDER KIT SURGIFOAM (HEMOSTASIS) ×2 IMPLANT
KIT BASIN OR (CUSTOM PROCEDURE TRAY) ×2 IMPLANT
KIT TURNOVER KIT B (KITS) ×2 IMPLANT
NEEDLE SPNL 20GX3.5 QUINCKE YW (NEEDLE) ×2 IMPLANT
NS IRRIG 1000ML POUR BTL (IV SOLUTION) ×4 IMPLANT
OIL CARTRIDGE MAESTRO DRILL (MISCELLANEOUS) ×2
PACK LAMINECTOMY NEURO (CUSTOM PROCEDURE TRAY) ×2 IMPLANT
PAD ARMBOARD 7.5X6 YLW CONV (MISCELLANEOUS) IMPLANT
PIN DISTRACTION 14MM (PIN) ×4 IMPLANT
PLATE ANT CERV XTEND 2 LV 32 (Plate) ×2 IMPLANT
SCREW VAR 4.2 XD SELF DRILL 14 (Screw) ×12 IMPLANT
SPACER HEDRON C 12X14X7 0D (Spacer) ×2 IMPLANT
SPACER HEDRON C 12X14X9 7D (Spacer) ×2 IMPLANT
SPONGE INTESTINAL PEANUT (DISPOSABLE) ×2 IMPLANT
SPONGE SURGIFOAM ABS GEL SZ50 (HEMOSTASIS) ×2 IMPLANT
STRIP CLOSURE SKIN 1/2X4 (GAUZE/BANDAGES/DRESSINGS) ×2 IMPLANT
SUT VIC AB 3-0 SH 8-18 (SUTURE) ×2 IMPLANT
SUT VICRYL 4-0 PS2 18IN ABS (SUTURE) ×2 IMPLANT
TAPE CLOTH 4X10 WHT NS (GAUZE/BANDAGES/DRESSINGS) IMPLANT
TOWEL GREEN STERILE (TOWEL DISPOSABLE) ×2 IMPLANT
TOWEL GREEN STERILE FF (TOWEL DISPOSABLE) ×2 IMPLANT
WATER STERILE IRR 1000ML POUR (IV SOLUTION) ×2 IMPLANT

## 2020-04-26 NOTE — Progress Notes (Signed)
Wife at bedside.  Per wife pt has been confused at night.  States pt has been getting up wondering around in the middle of the night.  Pt oriented times 4 at present,  But per wife.  Pt is still confused.  NP Margo Aye in [place] and notifed.  She called and spoke with Dr. Saintclair Halsted.  OK to send pt to progressive care.

## 2020-04-26 NOTE — Progress Notes (Signed)
Pharmacy Antibiotic Note  Patrick Gray is a 65 y.o. male admitted on 04/26/2020 with surgical prophylaxis.  Pharmacy has been consulted for vancomycin dosing.  Underwent anterior cervical discectomies/fusion C5-6 and C6-7 on 3/7. Received vancomycin 1500 mg IV once on 3/7@1010 . Scr 0.74 (CrCl >100 mL/min).   Plan: Order vancomycin 1g IV every 12 hours on 3/7@2200  Monitor renal fx, cx results, clinical pic, and levels as appropriate  Height: 5\' 11"  (180.3 cm) Weight: 99.8 kg (220 lb) IBW/kg (Calculated) : 75.3  Temp (24hrs), Avg:97.7 F (36.5 C), Min:97.3 F (36.3 C), Max:98 F (36.7 C)  Recent Labs  Lab 04/26/20 0852  WBC 4.3  CREATININE 0.74    Estimated Creatinine Clearance: 112.3 mL/min (by C-G formula based on SCr of 0.74 mg/dL).    Allergies  Allergen Reactions   Penicillins Rash and Other (See Comments)    Has patient had a PCN reaction causing immediate rash, facial/tongue/throat swelling, SOB or lightheadedness with hypotension: No Has patient had a PCN reaction causing severe rash involving mucus membranes or skin necrosis: No Has patient had a PCN reaction that required hospitalization No Has patient had a PCN reaction occurring within the last 10 years: No If all of the above answers are "NO", then may proceed with Cephalosporin use.     Thank you for allowing pharmacy to be a part of this patients care.  Antonietta Jewel, PharmD, [place] Clinical Pharmacist  Phone: [phone] 04/26/2020 4:22 PM  Please check AMION for all [facility] phone numbers After 10:00 PM, call [place] [phone]

## 2020-04-26 NOTE — Anesthesia Preprocedure Evaluation (Addendum)
Anesthesia Evaluation  Patient identified by MRN, date of birth, ID band Patient awake    Reviewed: Allergy & Precautions, H&P , NPO status , Patient's Chart, lab work & pertinent test results  Airway Mallampati: II  TM Distance: >3 FB Neck ROM: Limited    Dental no notable dental hx.    Pulmonary neg pulmonary ROS, former smoker,    Pulmonary exam normal breath sounds clear to auscultation       Cardiovascular hypertension, + dysrhythmias Atrial Fibrillation  Rhythm:Irregular Rate:Normal     Neuro/Psych negative neurological ROS  negative psych ROS   GI/Hepatic negative GI ROS, (+)     substance abuse  alcohol use,   Endo/Other  negative endocrine ROS  Renal/GU negative Renal ROS  negative genitourinary   Musculoskeletal negative musculoskeletal ROS (+)   Abdominal   Peds negative pediatric ROS (+)  Hematology negative hematology ROS (+)   Anesthesia Other Findings   Reproductive/Obstetrics negative OB ROS                            Anesthesia Physical Anesthesia Plan  ASA: III  Anesthesia Plan: General   Post-op Pain Management:    Induction: Intravenous  PONV Risk Score and Plan: 2 and Ondansetron, Dexamethasone and Treatment may vary due to age or medical condition  Airway Management Planned: Oral ETT and Video Laryngoscope Planned  Additional Equipment:   Intra-op Plan:   Post-operative Plan: Extubation in OR  Informed Consent: I have reviewed the patients History and Physical, chart, labs and discussed the procedure including the risks, benefits and alternatives for the proposed anesthesia with the patient or authorized representative who has indicated his/her understanding and acceptance.     Dental advisory given  Plan Discussed with: CRNA and Surgeon  Anesthesia Plan Comments:        Anesthesia Quick Evaluation

## 2020-04-26 NOTE — Op Note (Signed)
Operative diagnosis: Cervical spondylitic myelopathy from severe cervical stenosis C56 large herniated nucleus pulposus C6-7  Postoperative diagnosis: Same  Procedure: Anterior cervical discectomies and fusion C5-6 and C6-7 utilizing the globus [place] titanium cages packed with locally harvested autograft mixed with vivigen and anterior cervical plating utilizing the globus extend plating system  Surgeon: Dominica Severin cram  Assistant: Nash Shearer  Anesthesia: General  EBL: Minimal  HPI: 65 year old gentleman who had a fall sustained an injury to his neck has been complaining of neck and posterior shoulder pain but also some numbness and intermittent loss of use of his left leg.  Work-up revealed exam consistent with myelopathy he had trace weakness of his left tricep as well imaging showed large disc herniation with cord compression at C6-7 as well as severe spondylosis cord compression C5-6 due to patient's progression of clinical syndrome imaging findings and failed conservative treatment I recommended anterior cervical discectomies and fusion at those 2 levels.  I extensively went over the risks and benefits of the operation with him as well as perioperative course expectations of outcome and alternatives to surgery and he understood and agreed to proceed forward.  Operative procedure: Patient was brought in the OR was induced and general anesthesia positioned supine the neck in slight extension 5 pounds halter traction the right side was next prepped and draped in routine sterile fashion preoperative x-ray localized the appropriate level so a curvilinear incision was made just off the midline to the anterior border of the sternocleidomastoid and the superficial layer of platysma was dissected out divided longitudinally the avascular plane between the sternocleidomastoid and strap muscle was developed down to the prevertebral fascia.  The prevertebral fascia dissected away with Kitners.  Intraoperative  x-ray confirmed the appropriate level.  Longus coli was then reflected laterally and self-retaining retractors were placed.  Anterior osteophytes were bitten off the Leksell rongeur both disc base were then drilled down with a high-speed drill capturing the bone shavings and mucus trap under microscope lamination first looking at C6-7 this did appear to be a traumatic disc disruption with splaying open of the anterior longitudinal ligament and a badly disrupted degenerated disc posterior annulus under microscopic lamination was removed in piecemeal fashion aggressive under biting both endplates decompressing central canal several large free fragments were removed from the left C7 foramen and both C7 nerve roots were identified and skeletonized flush with pedicle.  At the end discectomy spinal cord and C7 nerve roots were widely decompressed.  This was packed with Gelfoam tension taken at C5-6 in a similar fashion C5-6 was drilled down capturing the bone shavings and mucus trap.  Aggressive undermining both endplates decompressing central canal marching laterally both C6 nerve roots were identified and decompressed and skeletonized flush the pedicle at the end of discectomy was no further stenosis at this level as well.  I then sized up a 7 mm parallel cage for C5-6 and 9 mm lordotic for C6-7 both cages inserted well a 32 mm globus extend plate was then selected and 6 self drilling 14 mm screws were placed all screws had excellent purchase.  I packs an additional bone graft lateral and anterior to the cage underneath the plate postop fluoroscopy confirmed good position of all the implants.  Wounds are copiously irrigated meticulous hemostasis was maintained and the wound was closed in layers with interrupted Vicryl in a running 4 subcuticular Dermabond benzoin Steri-Strips and a sterile dressing was applied and patient went recovery in stable condition.  At the end of  the case all needle counts and sponge counts were  correct.

## 2020-04-26 NOTE — Progress Notes (Signed)
   04/26/20 1300  Clinical Encounter Type  Visited With Other (Comment) (Spoke with male desk assoc)  Visit Type Spiritual support  Referral From Nurse  Consult/Referral To Chaplain  Chaplain responded to the consultation request. Male assoc. answered stated the patient is not in [facility]. The patient has been taken to surgery. This note was prepared by Jeanine Luz, M.Div..  For questions please contact by phone [phone].

## 2020-04-26 NOTE — Progress Notes (Signed)
Pt disoriented and combative.  Trying to get up off the stretcher.  Dr. Roanna Banning called and coming to bedside.

## 2020-04-26 NOTE — Transfer of Care (Signed)
Immediate Anesthesia Transfer of Care Note  Patient: Patrick Gray  Procedure(s) Performed: Anterior Cervical Decompression Fusion - Cervical five-Cervical six - Cervical six-Cervical seven (N/A )  Patient Location: PACU  Anesthesia Type:General  Level of Consciousness: drowsy, pateint uncooperative and responds to stimulation  Airway & Oxygen Therapy: Patient Spontanous Breathing  Post-op Assessment: Report given to RN, Post -op Vital signs reviewed and stable and Patient moving all extremities  Post vital signs: Reviewed and stable  Last Vitals:  Vitals Value Taken Time  BP 161/81 04/26/20 1548  Temp    Pulse 104 04/26/20 1551  Resp 18 04/26/20 1551  SpO2 87 % 04/26/20 1551  Vitals shown include unvalidated device data.  Last Pain:  Vitals:   04/26/20 0912  TempSrc:   PainSc: 0-No pain      Patients Stated Pain Goal: 3 (48/88/91 6945)  Complications: No complications documented.

## 2020-04-26 NOTE — H&P (Addendum)
Patrick Gray is an 65 y.o. male.   Chief Complaint: Neck and left greater than right arm pain HPI: 65 year old gentleman with progressive worsening neck and left arm pain numbness tingling weakness work-up revealed very large herniation at C6-7 left and moderate spondylosis with cervical stenosis at C5-6.  Due to patient progression of clinical syndrome significant weakness in left upper extremity and failed conservative treatment of recommended anterior cervical discectomies and fusion at those 2 levels.  I extensively went over the risks and benefits of that operation with him as well as perioperative course expectations of outcome and alternatives of surgery and he understood and agreed to proceed forward.  Past Medical History:  Diagnosis Date  . Anxiety   . Arthritis   . Deafness in left ear   . Dysrhythmia    PAF  . H/O cardiac radiofrequency ablation   . HTN (hypertension)   . Hypercholesterolemia   . Hypopotassemia   . Paroxysmal atrial fibrillation (HCC)   . Tremor    left hand    Past Surgical History:  Procedure Laterality Date  . ACOUSTIC NEUROMA RESECTION Left 19990  . APPENDECTOMY  1976  . ATRIAL FIBRILLATION ABLATION N/A 12/19/2016   Procedure: ATRIAL FIBRILLATION ABLATION;  Surgeon: Thompson Grayer, MD;  Location: [facility];  Service: Cardiovascular;  Laterality: N/A;  . CARDIAC CATHETERIZATION N/A 10/07/2015   Procedure: Left Heart Cath and Coronary Angiography;  Surgeon: Josue Hector, MD;  Location: [facility];  Service: Cardiovascular;  Laterality: N/A;  . CARDIOVERSION N/A 06/17/2013   Procedure: CARDIOVERSION;  Surgeon: Josue Hector, MD;  Location: [facility];  Service: Cardiovascular;  Laterality: N/A;  . CARDIOVERSION N/A 05/11/2016   Procedure: CARDIOVERSION;  Surgeon: Sanda Klein, MD;  Location: [facility];  Service: Cardiovascular;  Laterality: N/A;  . CARDIOVERSION N/A 05/24/2017   Procedure: CARDIOVERSION;  Surgeon: Sueanne Margarita,  MD;  Location: [facility];  Service: Cardiovascular;  Laterality: N/A;  . CARDIOVERSION N/A 11/19/2017   Procedure: CARDIOVERSION;  Surgeon: Pixie Casino, MD;  Location: [facility];  Service: Cardiovascular;  Laterality: N/A;  . CHOLECYSTECTOMY  1976  . CLUB FOOT RELEASE  1968  . ELECTROPHYSIOLOGIC STUDY N/A 11/30/2015   Procedure: Atrial Fibrillation Ablation;  Surgeon: Thompson Grayer, MD;  Location: [facility];  Service: Cardiovascular;  Laterality: N/A;  . TEE WITHOUT CARDIOVERSION N/A 05/11/2016   Procedure: TRANSESOPHAGEAL ECHOCARDIOGRAM (TEE);  Surgeon: Sanda Klein, MD;  Location: [facility] [place] [facility];  Service: Cardiovascular;  Laterality: N/A;  . TONSILLECTOMY  1967  . TOTAL SHOULDER ARTHROPLASTY Left 06/15/2016   Procedure: TOTAL SHOULDER ARTHROPLASTY;  Surgeon: Tania Ade, MD;  Location: [facility];  Service: Orthopedics;  Laterality: Left;  Left total shoulder arthroplasty    Family History  Problem Relation Age of Onset  . Hypertension Father   . Hypertension Mother   . Stroke Mother   . CVA Mother   . Hypertension Brother   . Hypercholesterolemia Brother   . Atrial fibrillation Brother   . Congestive Heart Failure Brother    Social History:  reports that he has quit smoking. He has never used smokeless tobacco. He reports current alcohol use of about 14.0 standard drinks of alcohol per week. He reports that he does not use drugs.  Allergies:  Allergies  Allergen Reactions  . Penicillins Rash and Other (See Comments)    Has patient had a PCN reaction causing immediate rash, facial/tongue/throat swelling, SOB or lightheadedness with hypotension: No Has patient had  a PCN reaction causing severe rash involving mucus membranes or skin necrosis: No Has patient had a PCN reaction that required hospitalization No Has patient had a PCN reaction occurring within the last 10 years: No If all of the above answers are "NO", then may proceed with Cephalosporin use.      Medications Prior to Admission  Medication Sig Dispense Refill  . apixaban (ELIQUIS) 5 MG TABS tablet Take 1 tablet (5 mg total) by mouth 2 (two) times daily. 180 tablet 1  . atorvastatin (LIPITOR) 20 MG tablet Take 20 mg by mouth every evening.     . Cholecalciferol (VITAMIN D-3) 5000 units TABS Take 5,000 Units by mouth daily.    . Coenzyme Q10 (COQ10) 100 MG CAPS Take 100 mg by mouth daily.    Marland Kitchen HYDROcodone-acetaminophen (NORCO/VICODIN) 5-325 MG tablet Take 1-2 tablets by mouth every 6 (six) hours as needed (pain).    Marland Kitchen ibuprofen (ADVIL,MOTRIN) 200 MG tablet Take 400-600 mg by mouth daily as needed (for pain).    Marland Kitchen lisinopril-hydrochlorothiazide (PRINZIDE,ZESTORETIC) 20-25 MG per tablet Take 1 tablet by mouth daily.     . metoprolol tartrate (LOPRESSOR) 50 MG tablet TAKE 1 TABLET BY MOUTH TWICE DAILY MAY  TAKE  AN  EXTRA  ONE-HALF  TABLET  AS  NEEDED  FOR  BREAKTHROUGH  AFIB (Patient taking differently: TAKE 1 TABLET BY MOUTH TWICE DAILY MAY  TAKE  AN  EXTRA  ONE-HALF  TABLET  AS  NEEDED  FOR  BREAKTHROUGH  AFIB) 200 tablet 3  . Multiple Vitamins-Minerals (ZINC PO) Take 1 tablet by mouth daily.    . potassium chloride SA (K-DUR) 20 MEQ tablet Take 1 tablet (20 meq) by mouth in the morning & take 2 tablets (40 meq) by mouth in the evening. 90 tablet 6  . QUERCETIN PO Take 1 capsule by mouth daily.    . sildenafil (REVATIO) 20 MG tablet Take 20-100 mg by mouth daily as needed for erectile dysfunction.  0    No results found for this or any previous visit (from the past 48 hour(s)). No results found.  Review of Systems  Musculoskeletal: Positive for neck pain.  Neurological: Positive for weakness and numbness.    Blood pressure (!) 163/110, pulse 94, temperature 98 F (36.7 C), temperature source Oral, resp. rate 18, height 5\' 11"  (1.803 m), weight 99.8 kg, SpO2 97 %.   Patient is awake and alert strength is 5-5 deltoid, bicep, tricep, wrist flexion, wrist extension, hand intrinsics  bilaterally he may have slight lower left tricep weakness of 4+ out of 5 most of what is feeling is aching and cramping. Assessment/Plan 65 year old presents for anterior cervical discectomy and fusion C5-6 C6-7.  Elaina Hoops, MD 04/26/2020, 10:39 AM

## 2020-04-26 NOTE — Anesthesia Procedure Notes (Signed)
Procedure Name: Intubation Date/Time: 04/26/2020 1:25 PM Performed by: Claris Che, CRNA Pre-anesthesia Checklist: Patient identified, Emergency Drugs available, Suction available, Patient being monitored and Timeout performed Patient Re-evaluated:Patient Re-evaluated prior to induction Oxygen Delivery Method: Circle system utilized Preoxygenation: Pre-oxygenation with 100% oxygen Induction Type: IV induction and Cricoid Pressure applied Ventilation: Mask ventilation without difficulty Laryngoscope Size: Glidescope Grade View: Grade II Tube type: Oral Tube size: 8.0 mm Number of attempts: 1 Airway Equipment and Method: Stylet and Video-laryngoscopy Placement Confirmation: ETT inserted through vocal cords under direct vision,  positive ETCO2 and breath sounds checked- equal and bilateral Secured at: 25 cm Tube secured with: Tape Dental Injury: Teeth and Oropharynx as per pre-operative assessment

## 2020-04-26 NOTE — Progress Notes (Signed)
PHARMACIST - PHYSICIAN ORDER COMMUNICATION  CONCERNING: P&T Medication Policy on Herbal Medications  DESCRIPTION:  This patient's order for: Quercetin and CoQ 10 100 mg  has been noted.  This product(s) is classified as an "herbal" or natural product. Due to a lack of definitive safety studies or FDA approval, nonstandard manufacturing practices, plus the potential risk of unknown drug-drug interactions while on inpatient medications, the Pharmacy and Therapeutics Committee does not permit the use of "herbal" or natural products of this type within [facility].   ACTION TAKEN: The pharmacy department is unable to verify this order at this time and your patient has been informed of this safety policy. Please reevaluate patient's clinical condition at discharge and address if the herbal or natural product(s) should be resumed at that time.

## 2020-04-27 ENCOUNTER — Encounter (HOSPITAL_COMMUNITY): Payer: Self-pay | Admitting: Neurosurgery

## 2020-04-27 DIAGNOSIS — Z8249 Family history of ischemic heart disease and other diseases of the circulatory system: Secondary | ICD-10-CM | POA: Diagnosis not present

## 2020-04-27 DIAGNOSIS — F05 Delirium due to known physiological condition: Secondary | ICD-10-CM | POA: Diagnosis not present

## 2020-04-27 DIAGNOSIS — G959 Disease of spinal cord, unspecified: Secondary | ICD-10-CM | POA: Diagnosis not present

## 2020-04-27 DIAGNOSIS — Z87891 Personal history of nicotine dependence: Secondary | ICD-10-CM | POA: Diagnosis not present

## 2020-04-27 DIAGNOSIS — R251 Tremor, unspecified: Secondary | ICD-10-CM | POA: Diagnosis not present

## 2020-04-27 DIAGNOSIS — H9192 Unspecified hearing loss, left ear: Secondary | ICD-10-CM | POA: Diagnosis present

## 2020-04-27 DIAGNOSIS — I480 Paroxysmal atrial fibrillation: Secondary | ICD-10-CM | POA: Diagnosis present

## 2020-04-27 DIAGNOSIS — Z7901 Long term (current) use of anticoagulants: Secondary | ICD-10-CM | POA: Diagnosis not present

## 2020-04-27 DIAGNOSIS — M50022 Cervical disc disorder at C5-C6 level with myelopathy: Secondary | ICD-10-CM | POA: Diagnosis present

## 2020-04-27 DIAGNOSIS — D333 Benign neoplasm of cranial nerves: Secondary | ICD-10-CM | POA: Diagnosis present

## 2020-04-27 DIAGNOSIS — E7800 Pure hypercholesterolemia, unspecified: Secondary | ICD-10-CM | POA: Diagnosis present

## 2020-04-27 DIAGNOSIS — Z823 Family history of stroke: Secondary | ICD-10-CM | POA: Diagnosis not present

## 2020-04-27 DIAGNOSIS — N319 Neuromuscular dysfunction of bladder, unspecified: Secondary | ICD-10-CM | POA: Diagnosis not present

## 2020-04-27 DIAGNOSIS — M4712 Other spondylosis with myelopathy, cervical region: Secondary | ICD-10-CM | POA: Diagnosis present

## 2020-04-27 DIAGNOSIS — M1990 Unspecified osteoarthritis, unspecified site: Secondary | ICD-10-CM | POA: Diagnosis present

## 2020-04-27 DIAGNOSIS — Z880 Allergy status to penicillin: Secondary | ICD-10-CM | POA: Diagnosis not present

## 2020-04-27 DIAGNOSIS — M79602 Pain in left arm: Secondary | ICD-10-CM | POA: Diagnosis present

## 2020-04-27 DIAGNOSIS — Z83438 Family history of other disorder of lipoprotein metabolism and other lipidemia: Secondary | ICD-10-CM | POA: Diagnosis not present

## 2020-04-27 DIAGNOSIS — I10 Essential (primary) hypertension: Secondary | ICD-10-CM | POA: Diagnosis present

## 2020-04-27 DIAGNOSIS — G250 Essential tremor: Secondary | ICD-10-CM | POA: Diagnosis present

## 2020-04-27 DIAGNOSIS — M50023 Cervical disc disorder at C6-C7 level with myelopathy: Secondary | ICD-10-CM | POA: Diagnosis present

## 2020-04-27 DIAGNOSIS — Z79899 Other long term (current) drug therapy: Secondary | ICD-10-CM | POA: Diagnosis not present

## 2020-04-27 MED ORDER — DOCUSATE SODIUM 100 MG PO CAPS
100.0000 mg | ORAL_CAPSULE | Freq: Two times a day (BID) | ORAL | Status: DC
  Administered 2020-04-27 – 2020-05-03 (×11): 100 mg via ORAL
  Filled 2020-04-27 (×12): qty 1

## 2020-04-27 MED ORDER — POLYETHYLENE GLYCOL 3350 17 G PO PACK
17.0000 g | PACK | Freq: Two times a day (BID) | ORAL | Status: DC
  Administered 2020-04-27 – 2020-05-03 (×11): 17 g via ORAL
  Filled 2020-04-27 (×12): qty 1

## 2020-04-27 NOTE — Progress Notes (Signed)
Pt agitated and paranoid, pulled out IV, refusing medications, taking off cervical collar and will not stay in bed despite education. Pt paranoid stating that he needs "a ride to the police station because there is drug distribution and prostitution happening here and you guys will kill me now that I know". Pt became aggressive when staff attempted to redirect him back to bed stating "I don't want to hurt you so get out of my way and don't touch me". On-call physician notified, order for sitter received, no medication orders given. Physician stated not to worry about getting new IV access because he doesn't need the IV medications. Pt's wife was called and asked to come back and stay with him because there is not a sitter available tonight. Will continue to monitor.

## 2020-04-27 NOTE — Evaluation (Signed)
Occupational Therapy Evaluation Patient Details Name: Patrick Gray MRN: [id number] DOB: Jan 04, 1956 Today's Date: 04/27/2020    History of Present Illness 65 yo male s/p ACDF C5-7. PMH including anxiety, arthritis, deafness left ear, HTN, a fib, and L total shoulder (2018).   Clinical Impression   PTA, pt was living with his wife and was performing BADLs; using a SPC since fall which pt reports happened about a month ago. Pt currently requiring Min A for LB ADL and functional mobility. Pt presenting with poor cognition, balance, and safety impacting his functional performance. Pt very motivated to participate in therapy and return to PLOF. Pt will require further acute OT to facilitate safe dc. Recommend dc to CIR for intensive OT to optimize safety, independent with ADLs, and return to PLOF as well as decrease caregiver burden.     Follow Up Recommendations  CIR    Equipment Recommendations  Tub/shower seat    Recommendations for Other Services PT consult;Rehab consult;Speech consult     Precautions / Restrictions Precautions Precautions: Fall;Cervical Precaution Booklet Issued: No Precaution Comments: Reviewed cervical precautions Required Braces or Orthoses: Cervical Brace Cervical Brace: Soft collar;At all times      Mobility Bed Mobility               General bed mobility comments: In recliner upon arrival    Transfers Overall transfer level: Needs assistance Equipment used: Rolling walker (2 wheeled);None Transfers: Sit to/from Stand Sit to Stand: Min guard         General transfer comment: Min Guard A for safety    Balance Overall balance assessment: Needs assistance Sitting-balance support: No upper extremity supported;Feet supported Sitting balance-Leahy Scale: Good     Standing balance support: No upper extremity supported;During functional activity Standing balance-Leahy Scale: Fair                             ADL either performed  or assessed with clinical judgement   ADL Overall ADL's : Needs assistance/impaired Eating/Feeding: Set up;Sitting   Grooming: Set up;Sitting   Upper Body Bathing: Sitting;Min guard   Lower Body Bathing: Minimal assistance;Sit to/from stand   Upper Body Dressing : Minimal assistance;Sitting   Lower Body Dressing: Minimal assistance;Sit to/from stand Lower Body Dressing Details (indicate cue type and reason): Educating pt on donning LB clothing with figure four method. Pt donning socks. Min A for balance in standing Toilet Transfer: Minimal assistance;Ambulation (simulated to recliner)           Functional mobility during ADLs: Minimal assistance;Rolling walker (with and without RW) General ADL Comments: Initiating education on LB dressing. Pt presenting with decreased cognition, balance, and safety.     Vision Baseline Vision/History: Wears glasses Patient Visual Report: No change from baseline       Perception     Praxis      Pertinent Vitals/Pain Pain Assessment: Faces Faces Pain Scale: Hurts a little bit Pain Location: Neck Pain Descriptors / Indicators: Constant;Discomfort;Grimacing Pain Intervention(s): Monitored during session;Limited activity within patient's tolerance;Repositioned     Hand Dominance Left   Extremity/Trunk Assessment Upper Extremity Assessment Upper Extremity Assessment: LUE deficits/detail LUE Deficits / Details: WFL for strength. Noting resting tremors (inconsistent and pt unaware at times).   Lower Extremity Assessment Lower Extremity Assessment: Defer to PT evaluation   Cervical / Trunk Assessment Cervical / Trunk Assessment: Other exceptions Cervical / Trunk Exceptions: ACDF. Forward rounding of shoudlers   Communication Communication Communication:  HOH   Cognition Arousal/Alertness: Awake/alert Behavior During Therapy: WFL for tasks assessed/performed Overall Cognitive Status: Impaired/Different from baseline Area of  Impairment: Attention;Memory;Following commands;Safety/judgement;Awareness;Problem solving                   Current Attention Level: Sustained Memory: Decreased short-term memory Following Commands: Follows one step commands inconsistently;Follows one step commands with increased time Safety/Judgement: Decreased awareness of safety;Decreased awareness of deficits (Very poor awareness of his balance deficits and feels he can get up and moving without assistance despite education) Awareness: Intellectual Problem Solving: Slow processing;Requires verbal cues General Comments: Pt reporting hallucination. Pt also with tangiental thinking and conversation. Pt requiring cues thorughout for attention.   General Comments       Exercises     Shoulder Instructions      Home Living Family/patient expects to be discharged to:: Private residence Living Arrangements: Spouse/significant other Available Help at Discharge: Family;Available 24 hours/day Type of Home: House Home Access: Stairs to enter [employer] of Steps: 6 Entrance Stairs-Rails: Right;Left;Can reach both Home Layout: One level     Bathroom Shower/Tub: Teacher, early years/pre: Standard     Home Equipment: Cane - single point          Prior Functioning/Environment Level of Independence: Independent with assistive device(s)        Comments: performing ADLs and recently used SPC after fall ~ month ago        OT Problem List: Decreased strength;Decreased range of motion;Decreased activity tolerance;Impaired balance (sitting and/or standing);Decreased cognition;Decreased safety awareness;Decreased knowledge of use of DME or AE;Decreased knowledge of precautions;Pain      OT Treatment/Interventions: Self-care/ADL training;Therapeutic exercise;Energy conservation;DME and/or AE instruction;Therapeutic activities;Patient/family education    OT Goals(Current goals can be found in the care plan  section) Acute Rehab OT Goals Patient Stated Goal: Get back home OT Goal Formulation: With patient Time For Goal Achievement: 05/11/20 Potential to Achieve Goals: Good  OT Frequency: Min 3X/week   Barriers to D/C:            Co-evaluation              AM-PAC OT "6 Clicks" Daily Activity     Outcome Measure Help from another person eating meals?: A Little Help from another person taking care of personal grooming?: A Little Help from another person toileting, which includes using toliet, bedpan, or urinal?: A Little Help from another person bathing (including washing, rinsing, drying)?: A Little Help from another person to put on and taking off regular upper body clothing?: A Little Help from another person to put on and taking off regular lower body clothing?: A Little 6 Click Score: 18   End of Session Equipment Utilized During Treatment: Cervical collar;Gait belt Nurse Communication: Mobility status  Activity Tolerance: Patient tolerated treatment well Patient left: in chair;with call bell/phone within reach;with chair alarm set  OT Visit Diagnosis: Unsteadiness on feet (R26.81);Other abnormalities of gait and mobility (R26.89);Muscle weakness (generalized) (M62.81);Pain;History of falling (Z91.81) Pain - part of body:  (Neck)                Time: 1410-1435 OT Time Calculation (min): 25 min Charges:  OT General Charges $OT Visit: 1 Visit OT Evaluation $OT Eval Moderate Complexity: 1 Mod OT Treatments $Self Care/Home Management : 8-22 mins  Kaisa Wofford MSOT, OTR/L Acute Rehab Pager: [phone] Office: [place] 04/27/2020, 3:16 PM

## 2020-04-27 NOTE — Plan of Care (Signed)

## 2020-04-27 NOTE — Progress Notes (Signed)
Subjective: Patient reports some neck pain. Per wife, still having some confusion. Has had some confusion at night at home the last couple months. Has not been sleeping well for months either. Has a history of acoustic neuroma  Objective: Vital signs in last 24 hours: Temp:  [97.2 F (36.2 C)-98 F (36.7 C)] 97.2 F (36.2 C) (03/07 1927) Pulse Rate:  [94-115] 103 (03/07 1927) Resp:  [13-21] 15 (03/07 1927) BP: (141-175)/(81-110) 145/84 (03/07 1927) SpO2:  [92 %-98 %] 96 % (03/07 1927) Weight:  [99.8 kg] 99.8 kg (03/07 0857)  Intake/Output from previous day: 03/07 0701 - 03/08 0700 In: 950 [I.V.:850; IV Piggyback:100] Out: 200 [Urine:200] Intake/Output this shift: No intake/output data recorded.  Neurologic: Grossly normal  Lab Results: Lab Results  Component Value Date   WBC 4.3 04/26/2020   HGB 13.1 04/26/2020   HCT 37.2 (L) 04/26/2020   MCV 91.9 04/26/2020   PLT 165 04/26/2020   Lab Results  Component Value Date   INR 1.19 06/08/2016   BMET Lab Results  Component Value Date   NA 138 04/26/2020   K 3.3 (L) 04/26/2020   CL 103 04/26/2020   CO2 24 04/26/2020   GLUCOSE 100 (H) 04/26/2020   BUN 19 04/26/2020   CREATININE 0.74 04/26/2020   CALCIUM 9.5 04/26/2020    Studies/Results: DG Cervical Spine 2-3 Views  Result Date: 04/26/2020 CLINICAL DATA:  C5-C7 ACDF. EXAM: OPERATIVE CERVICAL SPINE - 2-3 VIEW COMPARISON:  MRI cervical spine 04/11/2020. FINDINGS: Two LATERAL spot images from the C-arm fluoroscopic device are submitted for interpretation post-operatively. These demonstrate ACDF with hardware from C5 through C7 and interbody fusion plugs at C5-6 and C6-7. Alignment appears anatomic through the upper endplate of C7. IMPRESSION: ACDF with hardware C5-C7 and interbody fusion plugs at C5-6 and C6-7. Electronically Signed   By: Evangeline Dakin M.D.   On: 04/26/2020 15:51   DG C-Arm 1-60 Min  Result Date: 04/26/2020 CLINICAL DATA:  C5-C7 ACDF. EXAM: OPERATIVE  CERVICAL SPINE - 2-3 VIEW COMPARISON:  MRI cervical spine 04/11/2020. FINDINGS: Two LATERAL spot images from the C-arm fluoroscopic device are submitted for interpretation post-operatively. These demonstrate ACDF with hardware from C5 through C7 and interbody fusion plugs at C5-6 and C6-7. Alignment appears anatomic through the upper endplate of C7. IMPRESSION: ACDF with hardware C5-C7 and interbody fusion plugs at C5-6 and C6-7. Electronically Signed   By: Evangeline Dakin M.D.   On: 04/26/2020 15:51    Assessment/Plan: Continue therapy today. Might would be a good candidate for rehab.   LOS: 0 days    Ocie Cornfield Meyran 04/27/2020, 8:11 AM

## 2020-04-27 NOTE — Evaluation (Signed)
Physical Therapy Evaluation Patient Details Name: Patrick Gray MRN: [id number] DOB: 29-Jan-1956 Today's Date: 04/27/2020   History of Present Illness  65 yo male admitted 04/26/20 s/p ACDF C5-7. PMH including tremor, PAF, HTN, deafness in L ear, L TSA, acoustic neuroma resection in 1999 L.  Clinical Impression  Pt able to walk around his room with up to mod hand held assist for balance during gait without an assistive device.  Pt with cognitive deficits that seem to have intensified with surgery, but were there at baseline (worsening cognition per wife, Marlowe Kays). He admits to seeing things that are not there and knows they are not there.  He is oriented, but cannot answer basic problem solving question correctly and he has a Master's degree and worked in Production designer, theatre/television/film for years.  He is a high fall risk and would benefit from CIR level therapies prior to returning home with his wife to help increase his safety and decrease his fall risk.  I think some more standardized cognitive assessment is needed as well.   PT to follow acutely for deficits listed below.    Follow Up Recommendations CIR    Equipment Recommendations  None recommended by PT    Recommendations for Other Services Rehab consult     Precautions / Restrictions Precautions Precautions: Fall;Cervical Precaution Booklet Issued: No Precaution Comments: Reviewed cervical precautions verbally Required Braces or Orthoses: Cervical Brace Cervical Brace: Soft collar;At all times      Mobility  Bed Mobility Overal bed mobility: Needs Assistance Bed Mobility: Rolling;Sidelying to Sit;Sit to Sidelying Rolling: Supervision Sidelying to sit: Supervision     Sit to sidelying: Mod assist General bed mobility comments: Cues for log roll technique and correct sequencing to avoid straining despite OT just reviewing proper technique with him.  Most help needed to assist with getting both feet back into the bed.    Transfers Overall  transfer level: Needs assistance Equipment used: 1 person hand held assist Transfers: Sit to/from Stand Sit to Stand: Min guard         General transfer comment: Min guard assist for safety and balance, seems to lurch forward with trunk ahead of feet even in initial standing and more so with gait.  Ambulation/Gait Ambulation/Gait assistance: Mod assist Gait Distance (Feet): 15 Feet (x4 we went to the bathroom and back twice.) Assistive device: 1 person hand held assist Gait Pattern/deviations: Step-through pattern;Staggering left;Staggering right     General Gait Details: Pt with staggering, anteriorly pitched gait pattern, as if feet cannot keep up with his forward moving trunk.  Up to mod assist for balance during gait without AD.  I spoke with pt and his wife re: RW would be safest AD for him right now as it is most stable.  Stairs            Wheelchair Mobility    Modified Rankin (Stroke Patients Only)       Balance Overall balance assessment: Needs assistance Sitting-balance support: No upper extremity supported;Feet supported Sitting balance-Leahy Scale: Good     Standing balance support: Single extremity supported Standing balance-Leahy Scale: Poor Standing balance comment: needs external support for balance in standing.                             Pertinent Vitals/Pain Pain Assessment: Faces Faces Pain Scale: Hurts a little bit Pain Location: Neck Pain Descriptors / Indicators: Constant;Discomfort;Grimacing Pain Intervention(s): Limited activity within patient's tolerance;Monitored during  session;Repositioned    Home Living Family/patient expects to be discharged to:: Private residence Living Arrangements: Spouse/significant other Available Help at Discharge: Family;Available 24 hours/day Type of Home: House Home Access: Stairs to enter Entrance Stairs-Rails: Right;Left;Can reach both Entrance Stairs-Number of Steps: 6 Home Layout: One  level Home Equipment: Cane - single point Additional Comments: reports worsening urinary incontinence with significant worsening 5 days PTA, wife, Marlowe Kays, reports worsening cognition over the past year.    Prior Function Level of Independence: Independent with assistive device(s);Needs assistance   Gait / Transfers Assistance Needed: ambulated with a cane, and then just PTA switched to a RW     Comments: performing ADLs and recently used [facility] after fall ~ month ago     Hand Dominance   Dominant Hand: Left    Extremity/Trunk Assessment   Upper Extremity Assessment Upper Extremity Assessment: Defer to OT evaluation LUE Deficits / Details: WFL for strength. Noting resting tremors (inconsistent and pt unaware at times).    Lower Extremity Assessment Lower Extremity Assessment: Generalized weakness;RLE deficits/detail;LLE deficits/detail RLE Deficits / Details: bil LE with increased edema in both legs, some numbness and tingling from thigh down bil and equally deminished, strength 3+ to 4/5 per gross functional assessment. RLE Sensation: decreased light touch LLE Deficits / Details: bil LE with increased edema in both legs, some numbness and tingling from thigh down bil and equally deminished, strength 3+ to 4/5 per gross functional assessment. LLE Sensation: decreased light touch    Cervical / Trunk Assessment Cervical / Trunk Assessment: Other exceptions Cervical / Trunk Exceptions: ACDF.  significant forward head which pt's wife reports he had PTA, can correct some, but not maintain.  Communication   Communication: HOH (L ear deaf)  Cognition Arousal/Alertness: Awake/alert Behavior During Therapy: WFL for tasks assessed/performed Overall Cognitive Status: Impaired/Different from baseline Area of Impairment: Attention;Memory;Following commands;Safety/judgement;Awareness;Problem solving                   Current Attention Level: Selective Memory: Decreased short-term  memory;Decreased recall of precautions Following Commands: Follows one step commands consistently;Follows multi-step commands inconsistently Safety/Judgement: Decreased awareness of safety;Decreased awareness of deficits (Very poor awareness of his balance deficits and feels he can get up and moving without assistance despite education) Awareness: Intellectual Problem Solving: Requires verbal cues;Difficulty sequencing;Requires tactile cues General Comments: Pt alert and oriented, but unable to do a simple problem solving task (how many quarters in a dollar, then how many quarters in $2.75).  It took him 3 tries to get it right and only after being told he was wrong did he know it.  Wife reports he stopped reading at home (he used to love to read) and she often wakes up in the morning to find that he had been up moving things around in the middle of the night.      General Comments General comments (skin integrity, edema, etc.): Pt went to urinate twice during my short session. He has been getting up multiple times an hour with urinary frequency and urgency.  He reports incontinence was present PTA, but this is worse.    Exercises     Assessment/Plan    PT Assessment Patient needs continued PT services  PT Problem List Decreased strength;Decreased activity tolerance;Decreased balance;Decreased mobility;Decreased cognition;Decreased knowledge of use of DME;Decreased safety awareness;Decreased knowledge of precautions;Pain       PT Treatment Interventions DME instruction;Gait training;Stair training;Functional mobility training;Therapeutic exercise;Therapeutic activities;Balance training;Neuromuscular re-education;Cognitive remediation;Patient/family education    PT Goals (Current goals can  be found in the Care Plan section)  Acute Rehab PT Goals Patient Stated Goal: Get back home PT Goal Formulation: With patient/family Time For Goal Achievement: 05/11/20 Potential to Achieve Goals:  Good    Frequency Min 5X/week   Barriers to discharge        Co-evaluation               AM-PAC PT "6 Clicks" Mobility  Outcome Measure Help needed turning from your back to your side while in a flat bed without using bedrails?: A Little Help needed moving from lying on your back to sitting on the side of a flat bed without using bedrails?: A Little Help needed moving to and from a bed to a chair (including a wheelchair)?: A Little Help needed standing up from a chair using your arms (e.g., wheelchair or bedside chair)?: A Little Help needed to walk in hospital room?: A Lot Help needed climbing 3-5 steps with a railing? : A Lot 6 Click Score: 16    End of Session Equipment Utilized During Treatment: Cervical collar (soft collar.) Activity Tolerance: Patient tolerated treatment well Patient left: in bed;with call bell/phone within reach;with bed alarm set;with family/visitor present   PT Visit Diagnosis: Muscle weakness (generalized) (M62.81);Difficulty in walking, not elsewhere classified (R26.2);Other symptoms and signs involving the nervous system (Q59.563)    Time: 8756-4332 PT Time Calculation (min) (ACUTE ONLY): 31 min   Charges:   PT Evaluation $PT Eval Moderate Complexity: 1 Mod PT Treatments $Gait Training: 8-22 mins       Verdene Lennert, PT, DPT  Acute Rehabilitation [phone] pager [phone]) [phone] office

## 2020-04-27 NOTE — Progress Notes (Signed)
Rehab Admissions Coordinator Note:  Patient was screened by Cleatrice Burke for appropriateness for an Inpatient Acute Rehab Consult per OT recs..  At this time, we are recommending Inpatient Rehab consult. Please place order if you would like patient considered for CIR admit. Please advise.  Cleatrice Burke RN MSN 04/27/2020, 3:46 PM  I can be reached at [phone].

## 2020-04-27 NOTE — Anesthesia Postprocedure Evaluation (Signed)
Anesthesia Post Note  Patient: Patrick Gray  Procedure(s) Performed: Anterior Cervical Decompression Fusion - Cervical five-Cervical six - Cervical six-Cervical seven (N/A )     Patient location during evaluation: PACU Anesthesia Type: General Level of consciousness: awake and alert Pain management: pain level controlled Vital Signs Assessment: post-procedure vital signs reviewed and stable Respiratory status: spontaneous breathing, nonlabored ventilation, respiratory function stable and patient connected to nasal cannula oxygen Cardiovascular status: blood pressure returned to baseline and stable Postop Assessment: no apparent nausea or vomiting Anesthetic complications: no   No complications documented.  Last Vitals:  Vitals:   04/26/20 1927 04/27/20 0820  BP: (!) 145/84 140/82  Pulse: (!) 103 88  Resp: 15 (!) 22  Temp: (!) 36.2 C (!) 36.3 C  SpO2: 96% 100%    Last Pain:  Vitals:   04/27/20 0935  TempSrc:   PainSc: 2                  Georgine Wiltse S

## 2020-04-28 ENCOUNTER — Inpatient Hospital Stay (HOSPITAL_COMMUNITY): Payer: BC Managed Care – PPO

## 2020-04-28 LAB — BASIC METABOLIC PANEL
Anion gap: 10 (ref 5–15)
BUN: 9 mg/dL (ref 8–23)
CO2: 26 mmol/L (ref 22–32)
Calcium: 8.9 mg/dL (ref 8.9–10.3)
Chloride: 104 mmol/L (ref 98–111)
Creatinine, Ser: 0.63 mg/dL (ref 0.61–1.24)
GFR, Estimated: >60 mL/min (ref >60)
Glucose, Bld: 106 mg/dL — ABNORMAL HIGH (ref 70–99)
Potassium: 3.2 mmol/L — ABNORMAL LOW (ref 3.5–5.1)
Sodium: 140 mmol/L (ref 135–145)

## 2020-04-28 MED ORDER — GADOBUTROL 1 MMOL/ML IV SOLN
10.0000 mL | Freq: Once | INTRAVENOUS | Status: AC | PRN
  Administered 2020-04-28: 10 mL via INTRAVENOUS

## 2020-04-28 MED ORDER — PANTOPRAZOLE SODIUM 40 MG PO TBEC
40.0000 mg | DELAYED_RELEASE_TABLET | Freq: Every day | ORAL | Status: DC
  Administered 2020-04-28 – 2020-05-02 (×5): 40 mg via ORAL
  Filled 2020-04-28 (×5): qty 1

## 2020-04-28 NOTE — Progress Notes (Signed)
Patient with multiple attemtps to pull at foley and attemtping to get out of bed. Walked with pt around room multiple times. Pt taken to bathroom multiple times due to pt stating he needed to have a BM. One large BM noted during shift. Pt getting out of bed with eyes closed. While ambulating with FWW will lift with right hand and attempt to hold wall/furniture surrounding him. Pt verbalized multiple threats that he will beat this nurse if she does not get out of his way during stand by assistance while walking. Noted pt to be snoring and will sit up at bedside with eyes closed and snoring continues, pt will stop snoring when staff verbally makes presence know, bed alarm does not wake pt. Pt with statements that this secret program is keeping him hostage and he will have to run to escape. Attempted to page neurosurgery, awaiting call back

## 2020-04-28 NOTE — Progress Notes (Signed)
Pt continued to repeat up and down activity from bed throughout the night. This nurse at bedside from [phone] noted pt sleeping for short durations while at bedside. Pt continues to make statements to staff that he is going to hurt someone for getting in his way. Noted multiple episodes of shuffling and leaning while standing and walking. Pt up in chair at this time, call bell in reach, personal belongings in reach on beside table, chair alarm active, pt refusing grip socks.  Dr. Reatha Armour returned call and updated on pt status.

## 2020-04-28 NOTE — Progress Notes (Signed)
Physical Therapy Treatment Patient Details Name: Patrick Gray MRN: [id number] DOB: 1955/10/14 Today's Date: 04/28/2020    History of Present Illness 65 yo male admitted 04/26/20 s/p ACDF C5-7. s/p MRI on 04/28/20 which showed only a residual acoustic neuroma tumor that will be followed with serial scans as an OP per neurosurgery.  PMH including tremor, PAF, HTN, deafness in L ear, L TSA, acoustic neuroma resection in 1999 L.    PT Comments    Pt was able to walk much further today as he was not distracted by his near constant need to go to the restroom.  He has a foley catheter in place today.  He is much safer with RW use, but still needs assistance with steering, reminder of posture and cervical precautions and is still quite impulsive.  His therapy goal is to not need an assistive device and improve his balance.  PT will continue to follow acutely for safe mobility progression.  Bring SPC next session (I am not sure if he is ready for this yet, but it will reinforce that RW is device of choice) and start stair training.   Follow Up Recommendations  CIR     Equipment Recommendations  None recommended by PT    Recommendations for Other Services Rehab consult     Precautions / Restrictions Precautions Precautions: Fall;Cervical Precaution Booklet Issued: Yes (comment) Precaution Comments: gave handout for pt to review as he could recall none of the cervical precautions from yesterday. Required Braces or Orthoses: Cervical Brace Cervical Brace: Soft collar;At all times    Mobility  Bed Mobility Overal bed mobility: Needs Assistance Bed Mobility: Supine to Sit (despite cues to log roll, pt sat straight up)     Supine to sit: Supervision     General bed mobility comments: Supervision for safety, cues to log roll went unheard or he could not remember what log roll meant.  Reinforced verbally.    Transfers Overall transfer level: Needs assistance Equipment used: Rolling walker (2  wheeled) Transfers: Sit to/from Stand Sit to Stand: Min guard         General transfer comment: Min guard assist for balance as pt stood EOB and PT had to make him sit down multiple times as I was not ready with RW or lines on the correct side of the bed and he kept standing up.  Ambulation/Gait Ambulation/Gait assistance: Min assist Gait Distance (Feet): 260 Feet Assistive device: Rolling walker (2 wheeled) Gait Pattern/deviations: Step-through pattern;Staggering left;Staggering right     General Gait Details: Pt continues to have difficulty with balance, but was much safer using the RW.  He needed min assist at times to steer, and frequent cues for upright posture.   Stairs             Wheelchair Mobility    Modified Rankin (Stroke Patients Only)       Balance Overall balance assessment: Needs assistance Sitting-balance support: Feet supported;No upper extremity supported Sitting balance-Leahy Scale: Good     Standing balance support: Bilateral upper extremity supported;Single extremity supported Standing balance-Leahy Scale: Poor Standing balance comment: needs external support for balance in standing.                            Cognition Arousal/Alertness: Awake/alert Behavior During Therapy: Impulsive Overall Cognitive Status: Impaired/Different from baseline Area of Impairment: Memory;Awareness;Safety/judgement  Memory: Decreased recall of precautions;Decreased short-term memory   Safety/Judgement: Decreased awareness of safety;Decreased awareness of deficits Awareness: Emergent   General Comments: Pt continues to stand without a device, cannot recall log roll precaution, move quickly and impulsively compared to his balance and strength abilities.      Exercises      General Comments General comments (skin integrity, edema, etc.): Pt's goal is to get back to ambulating without an assistive device and improve  his balance.      Pertinent Vitals/Pain Pain Assessment: Faces Faces Pain Scale: Hurts a little bit Pain Location: neck, no HA Pain Descriptors / Indicators: Grimacing;Guarding Pain Intervention(s): Limited activity within patient's tolerance;Monitored during session;Repositioned    Home Living                      Prior Function            PT Goals (current goals can now be found in the care plan section) Acute Rehab PT Goals Patient Stated Goal: Get back home Progress towards PT goals: Progressing toward goals    Frequency    Min 5X/week      PT Plan Current plan remains appropriate    Co-evaluation              AM-PAC PT "6 Clicks" Mobility   Outcome Measure  Help needed turning from your back to your side while in a flat bed without using bedrails?: A Little Help needed moving from lying on your back to sitting on the side of a flat bed without using bedrails?: A Little Help needed moving to and from a bed to a chair (including a wheelchair)?: A Little Help needed standing up from a chair using your arms (e.g., wheelchair or bedside chair)?: A Little Help needed to walk in hospital room?: A Lot Help needed climbing 3-5 steps with a railing? : A Little 6 Click Score: 17    End of Session Equipment Utilized During Treatment: Cervical collar;Other (comment) (soft collar) Activity Tolerance: Patient tolerated treatment well Patient left: in chair;with call bell/phone within reach;with chair alarm set Nurse Communication: Other (comment) (pt is up in the chair alarmed to RN tech) PT Visit Diagnosis: Muscle weakness (generalized) (M62.81);Difficulty in walking, not elsewhere classified (R26.2);Other symptoms and signs involving the nervous system (L93.790)     Time: 2409-7353 PT Time Calculation (min) (ACUTE ONLY): 29 min  Charges:  $Gait Training: 23-37 mins                     Verdene Lennert, PT, DPT  Acute Rehabilitation [phone]  pager [phone]) [phone] office

## 2020-04-28 NOTE — Progress Notes (Signed)
Subjective: Patient reports Feeling better decrease numbness in his leg arms and hands improved tremor improved  Objective: Vital signs in last 24 hours: Temp:  [98 F (36.7 C)-98.7 F (37.1 C)] 98.2 F (36.8 C) (03/09 1104) Pulse Rate:  [102-110] 102 (03/09 1104) Resp:  [13-20] 13 (03/09 1104) BP: (147-159)/(99-109) 159/109 (03/09 1104) SpO2:  [91 %-98 %] 91 % (03/09 1104)  Intake/Output from previous day: 03/08 0701 - 03/09 0700 In: 200 [IV Piggyback:200] Out: 4955 [Urine:4955] Intake/Output this shift: Total I/O In: 70 [P.O.:70] Out: 250 [Urine:250]  Awake alert oriented strength 5 out of 5 upper and lower extremities incision clean dry and intact  Lab Results: Recent Labs    04/26/20 0852  WBC 4.3  HGB 13.1  HCT 37.2*  PLT 165   BMET Recent Labs    04/26/20 0852 04/28/20 0359  NA 138 140  K 3.3* 3.2*  CL 103 104  CO2 24 26  GLUCOSE 100* 106*  BUN 19 9  CREATININE 0.74 0.63  CALCIUM 9.5 8.9    Studies/Results: DG Cervical Spine 2-3 Views  Result Date: 04/26/2020 CLINICAL DATA:  C5-C7 ACDF. EXAM: OPERATIVE CERVICAL SPINE - 2-3 VIEW COMPARISON:  MRI cervical spine 04/11/2020. FINDINGS: Two LATERAL spot images from the C-arm fluoroscopic device are submitted for interpretation post-operatively. These demonstrate ACDF with hardware from C5 through C7 and interbody fusion plugs at C5-6 and C6-7. Alignment appears anatomic through the upper endplate of C7. IMPRESSION: ACDF with hardware C5-C7 and interbody fusion plugs at C5-6 and C6-7. Electronically Signed   By: Evangeline Dakin M.D.   On: 04/26/2020 15:51   MR BRAIN W WO CONTRAST  Result Date: 04/28/2020 CLINICAL DATA:  Mental status change.  Balance disturbance. EXAM: MRI HEAD WITHOUT AND WITH CONTRAST TECHNIQUE: Multiplanar, multiecho pulse sequences of the brain and surrounding structures were obtained without and with intravenous contrast. CONTRAST:  84mL GADAVIST GADOBUTROL 1 MMOL/ML IV SOLN COMPARISON:   None. FINDINGS: Brain: Diffusion imaging does not show any acute or subacute infarction. Chronic small-vessel ischemic changes affect pons. No focal cerebellar insult. Cerebral hemispheres show mild to moderate chronic small-vessel ischemic changes of the [place] matter. No cortical or large vessel territory infarction. No evidence of primary or metastatic intra-axial mass lesion, hemorrhage, hydrocephalus or extra-axial collection. There is an intra and extra canalicular mass on the left, bulging into the CP angle cistern, measuring 23 x 16 x 14 mm, consistent with vestibular schwannoma. Vascular: Major vessels at the base of the brain show flow. Skull and upper cervical spine: Negative Sinuses/Orbits: Clear/normal Other: None IMPRESSION: 1. 23 x 16 x 14 mm intra and extra canalicular mass on the left, bulging into the CP angle cistern, consistent with vestibular schwannoma. 2. Mild to moderate chronic small-vessel ischemic changes of the pons and cerebral hemispheric Ackert matter. Electronically Signed   By: Nelson Chimes M.D.   On: 04/28/2020 12:32   DG C-Arm 1-60 Min  Result Date: 04/26/2020 CLINICAL DATA:  C5-C7 ACDF. EXAM: OPERATIVE CERVICAL SPINE - 2-3 VIEW COMPARISON:  MRI cervical spine 04/11/2020. FINDINGS: Two LATERAL spot images from the C-arm fluoroscopic device are submitted for interpretation post-operatively. These demonstrate ACDF with hardware from C5 through C7 and interbody fusion plugs at C5-6 and C6-7. Alignment appears anatomic through the upper endplate of C7. IMPRESSION: ACDF with hardware C5-C7 and interbody fusion plugs at C5-6 and C6-7. Electronically Signed   By: Evangeline Dakin M.D.   On: 04/26/2020 15:51    Assessment/Plan: Postop day 2  anterior cervical discectomy and fusion doing well improved myelopathic symptoms MRI scan shows 2 cm acoustic neuroma consistent with his history do not have any previous films accessible to review so this will require serial follow-up.  LOS: 1  day     Elaina Hoops 04/28/2020, 1:46 PM

## 2020-04-28 NOTE — Progress Notes (Signed)
As of 0318 pt has not slept this shift. Pt will scoot self to bottom of bed, stand and walk to head of bed and lay down, close eyes while staff is at bedside, bed alarm activated. When staff leaves room pt will repeat process. This nurse sitting at bedside at this time

## 2020-04-28 NOTE — Plan of Care (Signed)

## 2020-04-29 MED ORDER — CHLORHEXIDINE GLUCONATE CLOTH 2 % EX PADS
6.0000 | MEDICATED_PAD | Freq: Every day | CUTANEOUS | Status: DC
  Administered 2020-04-29 – 2020-05-01 (×3): 6 via TOPICAL

## 2020-04-29 NOTE — PMR Pre-admission (Shared)
PMR Admission Coordinator Pre-Admission Assessment  Patient: Patrick Gray is an 65 y.o., male MRN: [id number] DOB: 1956-01-21 Height: 5' 11" (180.3 cm) Weight: 99.8 kg  Insurance Information HMO:    PPO: Yes     PCP:       IPA:       80/20:       OTHER: Group # [id number] PRIMARY: BCBS state health plan      Policy#: [id number]      Subscriber: patient CM Name: ***      Phone#: ***     Fax#: [phone] Pre-Cert#: [id number]     Employer: Retired Benefits:  Phone #: [phone]     Name: Irene Shipper. Date: 02/21/20     Deduct: $1250 ($0 met)      Out of Pocket Max: [phone] (met $158.02)      Life Max: N/A CIR: $300/admission copay then 80% coverage, 20% co-insurance      SNF: 80% coverage, 20% co insurance with limit of 100 days Outpatient: with medical necessity     Co-Pay: $10 to $52 coper pending provider type Home Health: 80%      Co-Pay: 20% DME: 80%     Co-Pay: 20% Providers: in network  SECONDARY:        Policy#:       Phone#:    Development worker, community:        Phone#:    The Engineer, petroleum" for patients in Inpatient Rehabilitation Facilities with attached "Privacy Act [place] Records" was provided and verbally reviewed with: N/A  Emergency Contact Information Contact Information    Name Relation Home Work Mobile   Krzyzanowski,Constance Spouse [phone]  [phone]      Current Medical History  Patient Admitting Diagnosis: ACDF C5-7  History of Present Illness: ***  Patient's medical record from [facility] health has been reviewed by the rehabilitation admission coordinator and physician.  Past Medical History  Past Medical History:  Diagnosis Date  . Anxiety   . Arthritis   . Deafness in left ear   . Dysrhythmia    PAF  . H/O cardiac radiofrequency ablation   . HTN (hypertension)   . Hypercholesterolemia   . Hypopotassemia   . Paroxysmal atrial fibrillation (HCC)   . Tremor    left hand    Family History   family history includes  Atrial fibrillation in his brother; CVA in his mother; Congestive Heart Failure in his brother; Hypercholesterolemia in his brother; Hypertension in his brother, father, and mother; Stroke in his mother.  Prior Rehab/Hospitalizations Has the patient had prior rehab or hospitalizations prior to admission? No  Has the patient had major surgery during 100 days prior to admission? Yes   Current Medications  Current Facility-Administered Medications:  .  0.9 %  sodium chloride infusion, 250 mL, Intravenous, Continuous, Kary Kos, MD .  acetaminophen (TYLENOL) tablet 650 mg, 650 mg, Oral, Q4H PRN, 650 mg at 04/27/20 2351 **OR** acetaminophen (TYLENOL) suppository 650 mg, 650 mg, Rectal, Q4H PRN, Kary Kos, MD .  alum & mag hydroxide-simeth (MAALOX/MYLANTA) 200-200-20 MG/5ML suspension 30 mL, 30 mL, Oral, Q6H PRN, Kary Kos, MD .  atorvastatin (LIPITOR) tablet 20 mg, 20 mg, Oral, QPM, Kary Kos, MD, 20 mg at 04/28/20 1811 .  Chlorhexidine Gluconate Cloth 2 % PADS 6 each, 6 each, Topical, Daily, Kary Kos, MD, 6 each at 04/29/20 1007 .  cholecalciferol (VITAMIN D3) tablet 5,000 Units, 5,000 Units, Oral, Daily, Kary Kos, MD, 5,000 Units at 04/29/20 1004 .  cyclobenzaprine (FLEXERIL) tablet 10 mg, 10 mg, Oral, TID PRN, Kary Kos, MD, 10 mg at 04/29/20 0557 .  docusate sodium (COLACE) capsule 100 mg, 100 mg, Oral, BID, Meyran, Ocie Cornfield, NP, 100 mg at 04/29/20 1004 .  lisinopril (ZESTRIL) tablet 20 mg, 20 mg, Oral, Daily, 20 mg at 04/28/20 1135 **AND** hydrochlorothiazide (HYDRODIURIL) tablet 25 mg, 25 mg, Oral, Daily, Blenda Nicely, RPH, 25 mg at 04/29/20 1004 .  HYDROcodone-acetaminophen (NORCO/VICODIN) 5-325 MG per tablet 1-2 tablet, 1-2 tablet, Oral, Q6H PRN, Kary Kos, MD, 2 tablet at 04/29/20 0557 .  HYDROmorphone (DILAUDID) injection 0.5 mg, 0.5 mg, Intravenous, Q2H PRN, Kary Kos, MD, 0.5 mg at 04/28/20 0212 .  ibuprofen (ADVIL) tablet 400-600 mg, 400-600 mg, Oral, Daily PRN,  Kary Kos, MD .  menthol-cetylpyridinium (CEPACOL) lozenge 3 mg, 1 lozenge, Oral, PRN **OR** phenol (CHLORASEPTIC) mouth spray 1 spray, 1 spray, Mouth/Throat, PRN, Kary Kos, MD .  metoprolol tartrate (LOPRESSOR) tablet 50 mg, 50 mg, Oral, BID, Kary Kos, MD, 50 mg at 04/29/20 1004 .  multivitamin with minerals tablet 1 tablet, 1 tablet, Oral, Daily, Blenda Nicely, RPH, 1 tablet at 04/29/20 1004 .  ondansetron (ZOFRAN) tablet 4 mg, 4 mg, Oral, Q6H PRN **OR** ondansetron (ZOFRAN) injection 4 mg, 4 mg, Intravenous, Q6H PRN, Kary Kos, MD .  oxyCODONE (Oxy IR/ROXICODONE) immediate release tablet 10 mg, 10 mg, Oral, Q3H PRN, Kary Kos, MD, 10 mg at 04/29/20 1004 .  pantoprazole (PROTONIX) EC tablet 40 mg, 40 mg, Oral, QHS, Kary Kos, MD, 40 mg at 04/28/20 2104 .  polyethylene glycol (MIRALAX / GLYCOLAX) packet 17 g, 17 g, Oral, BID, Meyran, Ocie Cornfield, NP, 17 g at 04/29/20 1004 .  potassium chloride SA (KLOR-CON) CR tablet 20 mEq, 20 mEq, Oral, BID, Kary Kos, MD, 20 mEq at 04/29/20 1004 .  sodium chloride flush (NS) 0.9 % injection 3 mL, 3 mL, Intravenous, Q12H, Kary Kos, MD, 3 mL at 04/29/20 1007 .  sodium chloride flush (NS) 0.9 % injection 3 mL, 3 mL, Intravenous, PRN, Kary Kos, MD  Patients Current Diet:  Diet Order            Diet regular Room service appropriate? Yes; Fluid consistency: Thin  Diet effective now                 Precautions / Restrictions Precautions Precautions: Fall,Cervical Precaution Booklet Issued: Yes (comment) Precaution Comments: handout present in room; pt unable to recall precautions, reviewed Cervical Brace: Soft collar,At all times Restrictions Weight Bearing Restrictions: No   Has the patient had 2 or more falls or a fall with injury in the past year? Yes  Prior Activity Level Limited Community (1-2x/wk): Out weekly for MD appointments  Prior Functional Level Self Care: Did the patient need help bathing, dressing, using the toilet  or eating? Needed some help  Indoor Mobility: Did the patient need assistance with walking from room to room (with or without device)? Needed some help  Stairs: Did the patient need assistance with internal or external stairs (with or without device)? Needed some help  Functional Cognition: Did the patient need help planning regular tasks such as shopping or remembering to take medications? Needed some help  Home Assistive Devices / Equipment Home Assistive Devices/Equipment: Eyeglasses Home Equipment: Kasandra Knudsen - single point  Prior Device Use: Indicate devices/aids used by the patient prior to current illness, exacerbation or injury? Single point cane  Current Functional Level Cognition  Overall Cognitive Status: Impaired/Different from baseline Current  Attention Level: Selective Orientation Level: Oriented X4 Following Commands: Follows one step commands consistently,Follows multi-step commands inconsistently Safety/Judgement: Decreased awareness of safety,Decreased awareness of deficits General Comments: Pt demonstrating improving problem solving as seen during oral care. Pt attempting to place tooth paste on brush and unable to do so as tooth paste has a seal. Pt recognitizing error once he went through motions of squeeze tube, rinsing off brush, and then bringing to mouth; tp tasting no tooth paste. Taking pt increased time to realize why he was unable to complete task and then requiring assist to pull seal off tooth paste. Pt cotinues to present with decreased memory as seen with log roll technique, recallign to use cup during oral care, and then recalling information from prior sessions. Was able to recall room number when walking in hallway.    Extremity Assessment (includes Sensation/Coordination)  Upper Extremity Assessment: LUE deficits/detail LUE Deficits / Details: WFL for strength. Noting resting tremors  Lower Extremity Assessment: Defer to PT evaluation RLE Deficits / Details:  bil LE with increased edema in both legs, some numbness and tingling from thigh down bil and equally deminished, strength 3+ to 4/5 per gross functional assessment. RLE Sensation: decreased light touch LLE Deficits / Details: bil LE with increased edema in both legs, some numbness and tingling from thigh down bil and equally deminished, strength 3+ to 4/5 per gross functional assessment. LLE Sensation: decreased light touch    ADLs  Overall ADL's : Needs assistance/impaired Eating/Feeding: Set up,Sitting Grooming: Min guard,Minimal assistance,Oral care,Standing Grooming Details (indicate cue type and reason): Min Guard-Min A for stanidng balance at sink. Requiring cues to recall education for use of cups. Upper Body Bathing: Sitting,Min guard Lower Body Bathing: Minimal assistance,Sit to/from stand Upper Body Dressing : Minimal assistance,Sitting Lower Body Dressing: Minimal assistance,Sit to/from stand Lower Body Dressing Details (indicate cue type and reason): Educating pt on donning LB clothing with figure four method. Pt donning socks. Min A for balance in standing Toilet Transfer: Minimal assistance,Ambulation,Min guard (simulated in room) Toilet Transfer Details (indicate cue type and reason): Min A for gaining balance in standing Functional mobility during ADLs: Minimal assistance,Cane General ADL Comments: Pt performing oral care and then mobility in hallway. Pt with noting dragging of LLE and then several episodes of LOB; requiring Min A for fall prevention adn correction of balance    Mobility  Overal bed mobility: Needs Assistance Bed Mobility: Rolling,Sidelying to Sit,Sit to Sidelying Rolling: Supervision Sidelying to sit: Supervision Supine to sit: Supervision Sit to sidelying: Min assist General bed mobility comments: Supervision for safety, extra time needed. Min A for managing BLEs in return to sidelying. Pt with poor recall of log roll and requiring Max cues     Transfers  Overall transfer level: Needs assistance Equipment used: None,Straight cane Transfers: Sit to/from Stand Sit to Stand: Min guard,Min assist General transfer comment: Min guard A for safety in standing; Min A to gain balance    Ambulation / Gait / Stairs / Wheelchair Mobility  Ambulation/Gait Ambulation/Gait assistance: Herbalist (Feet): 300 Feet Assistive device: Straight cane,Rolling walker (2 wheeled) Gait Pattern/deviations: Step-through pattern,Staggering left,Staggering right,Scissoring General Gait Details: Initiated gait with SPC in R hand this date with pt demonstrating appropriate sequencing of steps and use of cane but lateral LOB bouts needing minA to recover. Pt displays L foot scissoring, resulting in his LOB, along with impulsively quick anterior momentum at times. Cues to slow gait and focus on widening stance, mod success. Pt with  improved stability and no LOB utilizing RW at end of gait session. Gait velocity: reduced Gait velocity interpretation: <1.8 ft/sec, indicate of risk for recurrent falls Stairs: Yes Stairs assistance: Min assist Stair Management: One rail Right,Step to pattern,Forwards,Backwards Number of Stairs: 6 General stair comments: Ascending forward with step-to pattern and use of R rail, stepping posteriorly off step to descend 5x but on final rep he turned on step and ascended forward, minA for stability.    Posture / Balance Balance Overall balance assessment: Needs assistance Sitting-balance support: Feet supported,No upper extremity supported Sitting balance-Leahy Scale: Good Standing balance support: Bilateral upper extremity supported,Single extremity supported,No upper extremity supported Standing balance-Leahy Scale: Poor Standing balance comment: Able to perform oral care without UE support; Min guard-Min A for balance    Special needs/care consideration {Special Care Needs/Care Considerations:304600603}   Previous  Home Environment (from acute therapy documentation) Living Arrangements: Spouse/significant other Available Help at Discharge: Family,Available 24 hours/day Type of Home: House Home Layout: One level Home Access: Stairs to enter Entrance Stairs-Rails: Right,Left,Can reach both Entrance Stairs-Number of Steps: 6 Bathroom Shower/Tub: Chiropodist: Standard Additional Comments: reports worsening urinary incontinence with significant worsening 5 days PTA, wife, Marlowe Kays, reports worsening cognition over the past year.  Discharge Living Setting Plans for Discharge Living Setting: Patient's home,House,Lives with (comment) (Lives with wife) Type of Home at Discharge: House Discharge Home Layout: One level Discharge Home Access: Stairs to enter Entrance Stairs-Rails: Surveyor, mining of Steps: 5-6 steps Discharge Bathroom Shower/Tub: Walk-in shower,Curtain Discharge Bathroom Toilet: Handicapped height Discharge Bathroom Accessibility: Yes How Accessible: Accessible via walker  Social/Family/Support Systems Patient Roles: Spouse Contact Information: Achillies Buehl - wife Anticipated Caregiver: wife Anticipated Caregiver's Contact Information: Roland Rack - wife - [phone] Ability/Limitations of Caregiver: Retired, not working, can provide supervision to [employer] assist Caregiver Availability: 24/7 Discharge Plan Discussed with Primary Caregiver: Yes Is Caregiver In Agreement with Plan?: Yes Does Caregiver/Family have Issues with Lodging/Transportation while Pt is in Rehab?: No  Goals Patient/Family Goal for Rehab: PT/OT mod I to supervision goals Expected length of stay: 5-7 days Cultural Considerations: none Pt/Family Agrees to Admission and willing to participate: Yes Program Orientation Provided & Reviewed with Pt/Caregiver Including Roles  & Responsibilities: Yes  Decrease burden of Care through IP rehab admission: N/A  Possible need for SNF  placement upon discharge: Not anticipated  Patient Condition: I have reviewed medical records from [facility], spoken with CM, and patient and spouse. I met with patient at the bedside for inpatient rehabilitation assessment.  Patient will benefit from ongoing PT and OT, can actively participate in 3 hours of therapy a day 5 days of the week, and can make measurable gains during the admission.  Patient will also benefit from the coordinated team approach during an Inpatient Acute Rehabilitation admission.  The patient will receive intensive therapy as well as Rehabilitation physician, nursing, social worker, and care management interventions.  Due to bladder management, bowel management, safety, skin/wound care, disease management, medication administration, pain management and patient education the patient requires 24 hour a day rehabilitation nursing.  The patient is currently min to minguard with mobility and basic ADLs.  Discharge setting and therapy post discharge at home with home health is anticipated.  Patient has agreed to participate in the Acute Inpatient Rehabilitation Program and will admit {Time; today/tomorrow:10263}.  Preadmission Screen Completed By:  Retta Diones, 04/29/2020 2:29 PM ______________________________________________________________________   Discussed status with Dr. Marland Kitchen on *** at *** and received approval for  admission today.  Admission Coordinator:  Retta Diones, RN, time Marland KitchenSudie Grumbling ***   Assessment/Plan: Diagnosis: 1. Does the need for close, 24 hr/day Medical supervision in concert with the patient's rehab needs make it unreasonable for this patient to be served in a less intensive setting? {yes_no_potentially:3041433} 2. Co-Morbidities requiring supervision/potential complications: *** 3. Due to {due HF:4142395}, does the patient require 24 hr/day rehab nursing? {yes_no_potentially:3041433} 4. Does the patient require coordinated care of a physician, rehab  nurse, PT, OT, and SLP to address physical and functional deficits in the context of the above medical diagnosis(es)? {yes_no_potentially:3041433} Addressing deficits in the following areas: {deficits:3041436} 5. Can the patient actively participate in an intensive therapy program of at least 3 hrs of therapy 5 days a week? {yes_no_potentially:3041433} 6. The potential for patient to make measurable gains while on inpatient rehab is {potential:3041437} 7. Anticipated functional outcomes upon discharge from inpatient rehab: {functional outcomes:304600100} PT, {functional outcomes:304600100} OT, {functional outcomes:304600100} SLP 8. Estimated rehab length of stay to reach the above functional goals is: *** 9. Anticipated discharge destination: {anticipated dc setting:21604} 10. Overall Rehab/Functional Prognosis: {potential:3041437}   MD Signature: ***

## 2020-04-29 NOTE — Progress Notes (Signed)
IP rehab admissions - I met with patient and his wife at the bedside.  I gave them rehab booklets and explained inpatient rehab. Wife concerned about discharging directly home from hospital.  They would like a short CIR stay.  I will open insurance with BCBS state health plan and request authorization for CIR.  I will follow up once I hear back from insurance case manager.  Call for questions.  #[phone] 

## 2020-04-29 NOTE — Progress Notes (Addendum)
Occupational Therapy Treatment Patient Details Name: Patrick Gray MRN: [id number] DOB: 1955-03-17 Today's Date: 04/29/2020    History of present illness 65 yo male admitted 04/26/20 s/p ACDF C5-7. s/p MRI on 04/28/20 which showed only a residual acoustic neuroma tumor that will be followed with serial scans as an OP per neurosurgery.  PMH including tremor, PAF, HTN, deafness in L ear, L TSA, acoustic neuroma resection in 1999 L.   OT comments  Pt demonstrating improving problem solving and activity tolerance. However, pt continues to present with decreased cognition and balance impacting his safe performance of ADLs and mobility. Pt requiring Min A for balance during oral care and mobility with SPC in hallway. Pt with several episodes of LOB with Min A for balance correction; dragging of LLE. Pt very motivated to participate in therapy. Continue to recommend dc to CIR and will continue to follow acutely as admitted.     Follow Up Recommendations  CIR    Equipment Recommendations  Tub/shower seat    Recommendations for Other Services PT consult;Rehab consult;Speech consult    Precautions / Restrictions Precautions Precautions: Fall;Cervical Precaution Booklet Issued: Yes (comment) Precaution Comments: handout present in room; pt unable to recall precautions, reviewed Required Braces or Orthoses: Cervical Brace Cervical Brace: Soft collar;At all times Restrictions Weight Bearing Restrictions: No       Mobility Bed Mobility Overal bed mobility: Needs Assistance Bed Mobility: Rolling;Sidelying to Sit;Sit to Sidelying Rolling: Supervision Sidelying to sit: Supervision     Sit to sidelying: Min assist General bed mobility comments: Supervision for safety, extra time needed. Min A for managing BLEs in return to sidelying. Pt with poor recall of log roll and requiring Max cues    Transfers Overall transfer level: Needs assistance Equipment used: None;Straight cane Transfers: Sit  to/from Stand Sit to Stand: Min guard;Min assist         General transfer comment: Min guard A for safety in standing; Min A to gain balance    Balance Overall balance assessment: Needs assistance Sitting-balance support: Feet supported;No upper extremity supported Sitting balance-Leahy Scale: Good     Standing balance support: Bilateral upper extremity supported;Single extremity supported;No upper extremity supported Standing balance-Leahy Scale: Poor Standing balance comment: Able to perform oral care without UE support; Min guard-Min A for balance                           ADL either performed or assessed with clinical judgement   ADL Overall ADL's : Needs assistance/impaired     Grooming: Min guard;Minimal assistance;Oral care;Standing Grooming Details (indicate cue type and reason): Min Guard-Min A for stanidng balance at sink. Requiring cues to recall education for use of cups.                 Toilet Transfer: Minimal assistance;Ambulation;Min guard (simulated in room) Toilet Transfer Details (indicate cue type and reason): Min A for gaining balance in standing         Functional mobility during ADLs: Minimal assistance;Cane General ADL Comments: Pt performing oral care and then mobility in hallway. Pt with noting dragging of LLE and then several episodes of LOB; requiring Min A for fall prevention adn correction of balance     Vision       Perception     Praxis      Cognition Arousal/Alertness: Awake/alert Behavior During Therapy: Impulsive Overall Cognitive Status: Impaired/Different from baseline Area of Impairment: Memory;Awareness;Safety/judgement  Memory: Decreased recall of precautions;Decreased short-term memory   Safety/Judgement: Decreased awareness of safety;Decreased awareness of deficits Awareness: Emergent;Intellectual   General Comments: Pt demonstrating improving problem solving as seen during  oral care. Pt attempting to place tooth paste on brush and unable to do so as tooth paste has a seal. Pt recognitizing error once he went through motions of squeeze tube, rinsing off brush, and then bringing to mouth; tp tasting no tooth paste. Taking pt increased time to realize why he was unable to complete task and then requiring assist to pull seal off tooth paste. Pt cotinues to present with decreased memory as seen with log roll technique, recallign to use cup during oral care, and then recalling information from prior sessions. Was able to recall room number when walking in hallway.        Exercises     Shoulder Instructions       General Comments Wife present throughout    Pertinent Vitals/ Pain       Pain Assessment: Faces Faces Pain Scale: Hurts a little bit Pain Location: neck Pain Descriptors / Indicators: Grimacing;Guarding Pain Intervention(s): Monitored during session;Limited activity within patient's tolerance;Repositioned  Home Living                                          Prior Functioning/Environment              Frequency  Min 3X/week        Progress Toward Goals  OT Goals(current goals can now be found in the care plan section)  Progress towards OT goals: Progressing toward goals  Acute Rehab OT Goals Patient Stated Goal: Get back home without AD OT Goal Formulation: With patient/family Time For Goal Achievement: 05/11/20 Potential to Achieve Goals: Good ADL Goals Pt Will Perform Grooming: with modified independence;standing Pt Will Perform Lower Body Dressing: with supervision;sit to/from stand Pt Will Transfer to Toilet: with supervision;ambulating;regular height toilet Pt Will Perform Toileting - Clothing Manipulation and hygiene: with supervision;sit to/from stand;sitting/lateral leans Pt Will Perform Tub/Shower Transfer: with supervision;shower seat;ambulating;Tub transfer Additional ADL Goal #1: Pt will independently  verbalize cervical precautions Additional ADL Goal #2: Pt will perform bed mobility using log roll technique with Supervision in preparation for ADLs Additional ADL Goal #3: Pt will demonstrate increased ST memory to recall three grooming tasks with 2-3 cues  Plan Discharge plan remains appropriate    Co-evaluation                 AM-PAC OT "6 Clicks" Daily Activity     Outcome Measure   Help from another person eating meals?: A Little Help from another person taking care of personal grooming?: A Little Help from another person toileting, which includes using toliet, bedpan, or urinal?: A Little Help from another person bathing (including washing, rinsing, drying)?: A Little Help from another person to put on and taking off regular upper body clothing?: A Little Help from another person to put on and taking off regular lower body clothing?: A Little 6 Click Score: 18    End of Session Equipment Utilized During Treatment: Cervical collar;Other (comment) (cane)  OT Visit Diagnosis: Unsteadiness on feet (R26.81);Other abnormalities of gait and mobility (R26.89);Muscle weakness (generalized) (M62.81);Pain;History of falling (Z91.81) Pain - part of body:  (neck)   Activity Tolerance Patient tolerated treatment well   Patient Left with call bell/phone within reach;in  bed;with family/visitor present   Nurse Communication Mobility status        Time: 1135-1157 OT Time Calculation (min): 22 min  Charges: OT General Charges $OT Visit: 1 Visit OT Treatments $Self Care/Home Management : 8-22 mins  [place], OTR/L Acute Rehab Pager: [phone] Office: [place] 04/29/2020, 12:22 PM

## 2020-04-29 NOTE — Progress Notes (Signed)
Physical Therapy Treatment Patient Details Name: Patrick Gray MRN: [id number] DOB: 08/20/1955 Today's Date: 04/29/2020    History of Present Illness 65 yo male admitted 04/26/20 s/p ACDF C5-7. s/p MRI on 04/28/20 which showed only a residual acoustic neuroma tumor that will be followed with serial scans as an OP per neurosurgery.  PMH including tremor, PAF, HTN, deafness in L ear, L TSA, acoustic neuroma resection in 1999 L.    PT Comments    Focused session on progressing mobility with LRAD therefore utilizing a SPC this date. However, pt displays L foot scissoring and lateral LOB needing minA to recover often when utilizing a SPC. He displays improved stability and safety when utilizing a RW, thus recommending initially d/c with RW at this time to prevent falls. Pt continues to be impulsive with his anterior momentum when walking, needing cues to slow and focus on his feet placement to maintain safety. Attempted stairs today with pt needing minA for stability and extra time to ascend and descend several stairs. Will continue to follow acutely. Current recommendations remain appropriate.    Follow Up Recommendations  CIR     Equipment Recommendations  Rolling walker with 5" wheels    Recommendations for Other Services Rehab consult     Precautions / Restrictions Precautions Precautions: Fall;Cervical Precaution Booklet Issued: Yes (comment) Precaution Comments: handout present in room; pt unable to recall precautions, reviewed Required Braces or Orthoses: Cervical Brace Cervical Brace: Soft collar;At all times Restrictions Weight Bearing Restrictions: No    Mobility  Bed Mobility Overal bed mobility: Needs Assistance Bed Mobility: Rolling;Sidelying to Sit Rolling: Supervision Sidelying to sit: Supervision       General bed mobility comments: Supervision for safety, extra time needed.    Transfers Overall transfer level: Needs assistance Equipment used: Rolling walker (2  wheeled) Transfers: Sit to/from Stand Sit to Stand: Min guard         General transfer comment: Min guard assist for balance due to mild trunk sway, no overt LOB.  Ambulation/Gait Ambulation/Gait assistance: Min Web designer (Feet): 300 Feet Assistive device: Straight cane;Rolling walker (2 wheeled) Gait Pattern/deviations: Step-through pattern;Staggering left;Staggering right;Scissoring Gait velocity: reduced Gait velocity interpretation: <1.8 ft/sec, indicate of risk for recurrent falls General Gait Details: Initiated gait with SPC in R hand this date with pt demonstrating appropriate sequencing of steps and use of cane but lateral LOB bouts needing minA to recover. Pt displays L foot scissoring, resulting in his LOB, along with impulsively quick anterior momentum at times. Cues to slow gait and focus on widening stance, mod success. Pt with improved stability and no LOB utilizing RW at end of gait session.   Stairs Stairs: Yes Stairs assistance: Min assist Stair Management: One rail Right;Step to pattern;Forwards;Backwards Number of Stairs: 6 General stair comments: Ascending forward with step-to pattern and use of R rail, stepping posteriorly off step to descend 5x but on final rep he turned on step and ascended forward, minA for stability.   Wheelchair Mobility    Modified Rankin (Stroke Patients Only)       Balance Overall balance assessment: Needs assistance Sitting-balance support: Feet supported;No upper extremity supported Sitting balance-Leahy Scale: Good     Standing balance support: Bilateral upper extremity supported;Single extremity supported Standing balance-Leahy Scale: Poor Standing balance comment: needs external support for balance in standing with 1-2 UE support.  Cognition Arousal/Alertness: Awake/alert Behavior During Therapy: Impulsive Overall Cognitive Status: Impaired/Different from baseline Area  of Impairment: Memory;Awareness;Safety/judgement                     Memory: Decreased recall of precautions;Decreased short-term memory   Safety/Judgement: Decreased awareness of safety;Decreased awareness of deficits Awareness: Emergent   General Comments: Moves quickly and impulsively compared to his balance and strength abilities, needing cues to attend to his deficits and slow down to maintain safety. No recall of his cervical precautions.      Exercises      General Comments General comments (skin integrity, edema, etc.): Pt's goal is to return to ambulating without an AD      Pertinent Vitals/Pain Pain Assessment: Faces Faces Pain Scale: Hurts a little bit Pain Location: neck Pain Descriptors / Indicators: Grimacing;Guarding Pain Intervention(s): Limited activity within patient's tolerance;Monitored during session;Repositioned;Premedicated before session    Home Living                      Prior Function            PT Goals (current goals can now be found in the care plan section) Acute Rehab PT Goals Patient Stated Goal: Get back home without AD PT Goal Formulation: With patient Time For Goal Achievement: 05/11/20 Potential to Achieve Goals: Good Progress towards PT goals: Progressing toward goals    Frequency    Min 5X/week      PT Plan Current plan remains appropriate;Equipment recommendations need to be updated    Co-evaluation              AM-PAC PT "6 Clicks" Mobility   Outcome Measure  Help needed turning from your back to your side while in a flat bed without using bedrails?: A Little Help needed moving from lying on your back to sitting on the side of a flat bed without using bedrails?: A Little Help needed moving to and from a bed to a chair (including a wheelchair)?: A Little Help needed standing up from a chair using your arms (e.g., wheelchair or bedside chair)?: A Little Help needed to walk in hospital room?: A  Little Help needed climbing 3-5 steps with a railing? : A Little 6 Click Score: 18    End of Session Equipment Utilized During Treatment: Cervical collar;Other (comment) (soft collar) Activity Tolerance: Patient tolerated treatment well Patient left: in chair;with call bell/phone within reach;with chair alarm set Nurse Communication: Mobility status (pain meds prior to session) PT Visit Diagnosis: Muscle weakness (generalized) (M62.81);Difficulty in walking, not elsewhere classified (R26.2);Other symptoms and signs involving the nervous system (R29.898);Unsteadiness on feet (R26.81);Other abnormalities of gait and mobility (R26.89)     Time: 0940-7680 PT Time Calculation (min) (ACUTE ONLY): 26 min  Charges:  $Gait Training: 23-37 mins                     Moishe Spice, PT, DPT Acute Rehabilitation Services  Pager: [phone] Office: [phone]'   Orvan Falconer 04/29/2020, 10:39 AM

## 2020-04-29 NOTE — Plan of Care (Signed)
  Problem: Activity: Goal: Ability to avoid complications of mobility impairment will improve Outcome: Progressing Goal: Ability to tolerate increased activity will improve Outcome: Progressing

## 2020-04-29 NOTE — Progress Notes (Signed)
Pt continued to repeat up and down activity from bed throughout the night. He reports that his bed is not comfortable. Patient ambulated in the hall with a walker and tolerated well with one assist. He was able to back to bed and slept a couple of hours. Will continue to assess.

## 2020-04-30 LAB — BASIC METABOLIC PANEL
Anion gap: 8 (ref 5–15)
BUN: 12 mg/dL (ref 8–23)
CO2: 29 mmol/L (ref 22–32)
Calcium: 9.6 mg/dL (ref 8.9–10.3)
Chloride: 100 mmol/L (ref 98–111)
Creatinine, Ser: 0.72 mg/dL (ref 0.61–1.24)
GFR, Estimated: >60 mL/min (ref >60)
Glucose, Bld: 120 mg/dL — ABNORMAL HIGH (ref 70–99)
Potassium: 3.1 mmol/L — ABNORMAL LOW (ref 3.5–5.1)
Sodium: 137 mmol/L (ref 135–145)

## 2020-04-30 LAB — URINALYSIS, ROUTINE W REFLEX MICROSCOPIC
Bilirubin Urine: NEGATIVE
Glucose, UA: NEGATIVE mg/dL
Ketones, ur: 20 mg/dL — AB
Leukocytes,Ua: NEGATIVE
Nitrite: NEGATIVE
Protein, ur: >=300 mg/dL — AB
RBC / HPF: >50 RBC/hpf — ABNORMAL HIGH (ref 0–5)
Specific Gravity, Urine: 1.019 (ref 1.005–1.030)
pH: 7 (ref 5.0–8.0)

## 2020-04-30 LAB — CBC
HCT: 43.8 % (ref 39.0–52.0)
Hemoglobin: 15.5 g/dL (ref 13.0–17.0)
MCH: 31.8 pg (ref 26.0–34.0)
MCHC: 35.4 g/dL (ref 30.0–36.0)
MCV: 89.8 fL (ref 80.0–100.0)
Platelets: 196 10*3/uL (ref 150–400)
RBC: 4.88 MIL/uL (ref 4.22–5.81)
RDW: 13 % (ref 11.5–15.5)
WBC: 6.4 10*3/uL (ref 4.0–10.5)
nRBC: 0 % (ref 0.0–0.2)

## 2020-04-30 MED ORDER — LABETALOL HCL 5 MG/ML IV SOLN
5.0000 mg | Freq: Every day | INTRAVENOUS | Status: DC | PRN
  Administered 2020-04-30: 5 mg via INTRAVENOUS
  Filled 2020-04-30: qty 4

## 2020-04-30 MED ORDER — LORAZEPAM 2 MG/ML IJ SOLN
1.0000 mg | INTRAMUSCULAR | Status: DC | PRN
  Administered 2020-04-30 (×2): 2 mg via INTRAVENOUS
  Filled 2020-04-30: qty 2
  Filled 2020-04-30 (×2): qty 1

## 2020-04-30 MED ORDER — TAMSULOSIN HCL 0.4 MG PO CAPS
0.4000 mg | ORAL_CAPSULE | Freq: Every day | ORAL | Status: DC
  Administered 2020-05-01 – 2020-05-03 (×3): 0.4 mg via ORAL
  Filled 2020-04-30 (×4): qty 1

## 2020-04-30 MED ORDER — HYDRALAZINE HCL 20 MG/ML IJ SOLN: 10.0000 mg | INTRAMUSCULAR | Status: DC | PRN

## 2020-04-30 MED ORDER — HYDRALAZINE HCL 20 MG/ML IJ SOLN
INTRAMUSCULAR | Status: AC
  Administered 2020-04-30: 20 mg
  Filled 2020-04-30: qty 1

## 2020-04-30 MED ORDER — LABETALOL HCL 5 MG/ML IV SOLN: 5.0000 mg | Freq: Once | INTRAVENOUS | Status: DC

## 2020-04-30 NOTE — Progress Notes (Signed)
Subjective: Patient reports Increased confusion today did receive Ativan last night for agitation denies any new neck pain denies any new numbness or tingling or weakness in his arms and legs.  Objective: Vital signs in last 24 hours: Temp:  [97.6 F (36.4 C)-98.8 F (37.1 C)] 97.6 F (36.4 C) (03/11 1445) Pulse Rate:  [99-112] 112 (03/11 1445) Resp:  [15-20] 15 (03/11 1445) BP: (136-170)/(102-122) 148/105 (03/11 1445) SpO2:  [93 %-99 %] 95 % (03/11 1450)  Intake/Output from previous day: 03/10 0701 - 03/11 0700 In: 480 [P.O.:480] Out: 1850 [Urine:1850] Intake/Output this shift: Total I/O In: 118 [P.O.:118] Out: 900 [Urine:900]  Awake alert oriented strength out of 5 upper and lower extremities incision clean dry and intact  Lab Results: No results for input(s): WBC, HGB, HCT, PLT in the last 72 hours. BMET Recent Labs    04/28/20 0359  NA 140  K 3.2*  CL 104  CO2 26  GLUCOSE 106*  BUN 9  CREATININE 0.63  CALCIUM 8.9    Studies/Results: No results found.  Assessment/Plan: Postop day 4 anterior cervical discectomy and fusion for myelopathy with known confusion possibly early parkinsonism patient has intermittently displayed evidences of encephalopathy at times been very lucid at times very confused and agitated.  Is unclear how much of today's exam is consistent with the Ativan he got however the agitation that preceded that came after a day where he did very well.  We will repeat some blood work and get neurology to consult I do think he has signs of early parkinsonism possibly early dementia in addition is myelopathy we will see if they have any ideas as far as best medications to control some of the agitation and confusion.  LOS: 3 days     Elaina Hoops 04/30/2020, 3:31 PM

## 2020-04-30 NOTE — Plan of Care (Signed)
  Problem: Education: Goal: Ability to verbalize activity precautions or restrictions will improve Outcome: Progressing Goal: Knowledge of the prescribed therapeutic regimen will improve Outcome: Progressing Goal: Understanding of discharge needs will improve Outcome: Progressing   Problem: Activity: Goal: Ability to avoid complications of mobility impairment will improve Outcome: Progressing Goal: Ability to tolerate increased activity will improve Outcome: Progressing Goal: Will remain free from falls Outcome: Progressing   

## 2020-04-30 NOTE — Progress Notes (Signed)
Patient was continue to be up and down. He tried to pull his foley and tried to get out of bed multiple multiple times. RN walked with a patient around hall multiple times.   Around 1230 am, he starts to hallucinating and very confused. RN tried to reoriented with him and gave the reassurance multiple times. He is getting more agitated and combative. While he is ambulating, he got a cane and swung at [place] and tried to hit staff members. Several staff members tried to calm him down but it didn't work.   Neurosurgery, Dr. Christella Noa notified and received the wrist soft restraints order and ativan 1-4 mg IV Q2H PRN order.   Restraints applied and he tore two of restraints within 10 minutes while RN is getting ativan order. New restraints are applied again. Skin is intact and no injury noted.   RN administered 2 mg of ativan IV. Patient is sleeping at this time. Vital signs are stable. Will continue to assess.

## 2020-04-30 NOTE — Progress Notes (Signed)
Subjective: Patient a lot of confusion and disorientation  Objective: Vital signs in last 24 hours: Temp:  [97.6 F (36.4 C)-98.8 F (37.1 C)] 97.6 F (36.4 C) (03/11 1445) Pulse Rate:  [99-113] 113 (03/11 1600) Resp:  [15-20] 20 (03/11 1600) BP: (136-170)/(93-122) 145/93 (03/11 1600) SpO2:  [92 %-99 %] 92 % (03/11 1600)  Intake/Output from previous day: 03/10 0701 - 03/11 0700 In: 480 [P.O.:480] Out: 1850 [Urine:1850] Intake/Output this shift: Total I/O In: 118 [P.O.:118] Out: 900 [Urine:900]  neuro: confusion but awake and alert Lab Results: Lab Results  Component Value Date   WBC 4.3 04/26/2020   HGB 13.1 04/26/2020   HCT 37.2 (L) 04/26/2020   MCV 91.9 04/26/2020   PLT 165 04/26/2020   Lab Results  Component Value Date   INR 1.19 06/08/2016   BMET Lab Results  Component Value Date   NA 140 04/28/2020   K 3.2 (L) 04/28/2020   CL 104 04/28/2020   CO2 26 04/28/2020   GLUCOSE 106 (H) 04/28/2020   BUN 9 04/28/2020   CREATININE 0.63 04/28/2020   CALCIUM 8.9 04/28/2020    Studies/Results: No results found.  Assessment/Plan: Will order UA and labs, consult neuro for parkinsons and encephalopathy.    LOS: 3 days    Ocie Cornfield [facility] 04/30/2020, 5:37 PM

## 2020-04-30 NOTE — Progress Notes (Signed)
Inpatient Rehab Admissions Coordinator:  Saw pt and wife at bedside. Notified that received insurance authorization. However, pt is not medically ready to admit today d/t change in status.  Will continue to follow.   Gayland Curry, [place], [place] Admissions Coordinator [phone]

## 2020-04-30 NOTE — H&P (Incomplete)
Physical Medicine and Rehabilitation Admission H&P    CC: Functional deficits due to myelopathy and delirium.   HPI: Patrick Gray is a 65 year old male with history of PAF, tremors, left acoustic neuroma s/p resection, progressive neck pain with left arm pain numbness and weakness secondary to large herniated C6-C7 disc with moderate spondylosis and cervical stenosis at C5-C6.  Patient was noted to have myelopathy with failure of conservative therapy and he was admitted on 03/07 for ACDF C5-C7 by Dr. Saintclair Halsted.  He reported decrease in numbness as well as decrease in tremors post surgery.  However, postop care significant for confusion with agitation, neck pain as well as sleep-wake disruption.  Wife reported history of nighttime confusion for few months prior to admission.  He was noted to have balance deficits with ongoing confusion and developed progressive lethargy after dose of Ativan for agitation on 03/11.  UA was ordered to rule out infection and neurology was consulted for input on encephalopathy.     ROS    Past Medical History:  Diagnosis Date  . Anxiety   . Arthritis   . Deafness in left ear   . Dysrhythmia    PAF  . H/O cardiac radiofrequency ablation   . HTN (hypertension)   . Hypercholesterolemia   . Hypopotassemia   . Paroxysmal atrial fibrillation (HCC)   . Tremor    left hand    Past Surgical History:  Procedure Laterality Date  . ACOUSTIC NEUROMA RESECTION Left 19990  . ANTERIOR CERVICAL DECOMP/DISCECTOMY FUSION N/A 04/26/2020   Procedure: Anterior Cervical Decompression Fusion - Cervical five-Cervical six - Cervical six-Cervical seven;  Surgeon: Kary Kos, MD;  Location: [facility];  Service: Neurosurgery;  Laterality: N/A;  . APPENDECTOMY  1976  . ATRIAL FIBRILLATION ABLATION N/A 12/19/2016   Procedure: ATRIAL FIBRILLATION ABLATION;  Surgeon: Thompson Grayer, MD;  Location: [place] [facility];  Service: Cardiovascular;  Laterality: N/A;  . CARDIAC CATHETERIZATION  N/A 10/07/2015   Procedure: Left Heart Cath and Coronary Angiography;  Surgeon: Josue Hector, MD;  Location: [facility];  Service: Cardiovascular;  Laterality: N/A;  . CARDIOVERSION N/A 06/17/2013   Procedure: CARDIOVERSION;  Surgeon: Josue Hector, MD;  Location: [facility];  Service: Cardiovascular;  Laterality: N/A;  . CARDIOVERSION N/A 05/11/2016   Procedure: CARDIOVERSION;  Surgeon: Sanda Klein, MD;  Location: [facility];  Service: Cardiovascular;  Laterality: N/A;  . CARDIOVERSION N/A 05/24/2017   Procedure: CARDIOVERSION;  Surgeon: Sueanne Margarita, MD;  Location: [facility];  Service: Cardiovascular;  Laterality: N/A;  . CARDIOVERSION N/A 11/19/2017   Procedure: CARDIOVERSION;  Surgeon: Pixie Casino, MD;  Location: [facility];  Service: Cardiovascular;  Laterality: N/A;  . CHOLECYSTECTOMY  1976  . CLUB FOOT RELEASE  1968  . ELECTROPHYSIOLOGIC STUDY N/A 11/30/2015   Procedure: Atrial Fibrillation Ablation;  Surgeon: Thompson Grayer, MD;  Location: [place] [facility];  Service: Cardiovascular;  Laterality: N/A;  . TEE WITHOUT CARDIOVERSION N/A 05/11/2016   Procedure: TRANSESOPHAGEAL ECHOCARDIOGRAM (TEE);  Surgeon: Sanda Klein, MD;  Location: [place];  Service: Cardiovascular;  Laterality: N/A;  . TONSILLECTOMY  1967  . TOTAL SHOULDER ARTHROPLASTY Left 06/15/2016   Procedure: TOTAL SHOULDER ARTHROPLASTY;  Surgeon: Tania Ade, MD;  Location: [place];  Service: Orthopedics;  Laterality: Left;  Left total shoulder arthroplasty    Family History  Problem Relation Age of Onset  . Hypertension Father   . Hypertension Mother   . Stroke Mother   . CVA Mother   .  Hypertension Brother   . Hypercholesterolemia Brother   . Atrial fibrillation Brother   . Congestive Heart Failure Brother     Social History:  reports that he has quit smoking. He has never used smokeless tobacco. He reports current alcohol use of about 14.0 standard drinks of alcohol per week. He  reports that he does not use drugs.   Allergies  Allergen Reactions  . Penicillins Rash and Other (See Comments)    Has patient had a PCN reaction causing immediate rash, facial/tongue/throat swelling, SOB or lightheadedness with hypotension: No Has patient had a PCN reaction causing severe rash involving mucus membranes or skin necrosis: No Has patient had a PCN reaction that required hospitalization No Has patient had a PCN reaction occurring within the last 10 years: No If all of the above answers are "NO", then may proceed with Cephalosporin use.     Medications Prior to Admission  Medication Sig Dispense Refill  . atorvastatin (LIPITOR) 20 MG tablet Take 20 mg by mouth every evening.     . Cholecalciferol (VITAMIN D-3) 5000 units TABS Take 5,000 Units by mouth daily.    . Coenzyme Q10 (COQ10) 100 MG CAPS Take 100 mg by mouth daily.    Marland Kitchen HYDROcodone-acetaminophen (NORCO/VICODIN) 5-325 MG tablet Take 1-2 tablets by mouth every 6 (six) hours as needed (pain).    Marland Kitchen ibuprofen (ADVIL,MOTRIN) 200 MG tablet Take 400-600 mg by mouth daily as needed (for pain).    Marland Kitchen lisinopril-hydrochlorothiazide (PRINZIDE,ZESTORETIC) 20-25 MG per tablet Take 1 tablet by mouth daily.     . metoprolol tartrate (LOPRESSOR) 50 MG tablet TAKE 1 TABLET BY MOUTH TWICE DAILY MAY  TAKE  AN  EXTRA  ONE-HALF  TABLET  AS  NEEDED  FOR  BREAKTHROUGH  AFIB (Patient taking differently: TAKE 1 TABLET BY MOUTH TWICE DAILY MAY  TAKE  AN  EXTRA  ONE-HALF  TABLET  AS  NEEDED  FOR  BREAKTHROUGH  AFIB) 200 tablet 3  . Multiple Vitamins-Minerals (ZINC PO) Take 1 tablet by mouth daily.    . potassium chloride SA (K-DUR) 20 MEQ tablet Take 1 tablet (20 meq) by mouth in the morning & take 2 tablets (40 meq) by mouth in the evening. 90 tablet 6  . QUERCETIN PO Take 1 capsule by mouth daily.    . sildenafil (REVATIO) 20 MG tablet Take 20-100 mg by mouth daily as needed for erectile dysfunction.  0    Drug Regimen Review { DRUG REGIMEN  [id number]}  Home: Home Living Family/patient expects to be discharged to:: Private residence Living Arrangements: Spouse/significant other Available Help at Discharge: Family,Available 24 hours/day Type of Home: House Home Access: Stairs to enter [employer] of Steps: 6 Entrance Stairs-Rails: Right,Left,Can reach both Home Layout: One level Bathroom Shower/Tub: Chiropodist: Standard Home Equipment: Cane - single point Additional Comments: reports worsening urinary incontinence with significant worsening 5 days PTA, wife, Marlowe Kays, reports worsening cognition over the past year.   Functional History: Prior Function Level of Independence: Independent with assistive device(s),Needs assistance Gait / Transfers Assistance Needed: ambulated with a cane, and then just PTA switched to a RW Comments: performing ADLs and recently used SPC after fall ~ month ago  Functional Status:  Mobility: Bed Mobility Overal bed mobility: Needs Assistance Bed Mobility: Rolling,Sidelying to Sit,Sit to Supine Rolling: Min guard Sidelying to sit: Min assist Supine to sit: Supervision Sit to sidelying: Min assist General bed mobility comments: Pt lethargic, opening eyes only briefly on occasion. Needing  extra time and close min guard-minA to complete all tasks, suspecting due to lethargy rather than strength deficits. Cues to maintain spinal precautions, pt did log roll but initially went to opposite side of bed. Bed flat. Transfers Overall transfer level: Needs assistance Equipment used: Rolling walker (2 wheeled) Transfers: Sit to/from Stand Sit to Stand: Min assist General transfer comment: MinA to power up to stand with extra effort from low sitting bed, likely increased assist due to lethargy. Ambulation/Gait Ambulation/Gait assistance: Max assist,Min assist Gait Distance (Feet): 6 Feet Assistive device: Rolling walker (2 wheeled) Gait Pattern/deviations:  Step-through pattern,Shuffle,Trunk flexed General Gait Details: Pt ambulating forward with eyes closed, cued to open and pay attention to his safety with no success. MinA initially but displayed x2 large LOB bouts, 1x posteriorly and 1x anteriorly, needing maxA to recover balance and cue pt to step backwards to EOB to return to bed for safety purposes. Gait velocity: reduced Gait velocity interpretation: <1.31 ft/sec, indicative of household ambulator Stairs: Yes Stairs assistance: Min assist Stair Management: One rail Right,Step to pattern,Forwards,Backwards Number of Stairs: 6 General stair comments: Ascending forward with step-to pattern and use of R rail, stepping posteriorly off step to descend 5x but on final rep he turned on step and ascended forward, minA for stability.    ADL: ADL Overall ADL's : Needs assistance/impaired Eating/Feeding: Set up,Sitting Grooming: Min guard,Minimal assistance,Oral care,Standing Grooming Details (indicate cue type and reason): Min Guard-Min A for stanidng balance at sink. Requiring cues to recall education for use of cups. Upper Body Bathing: Sitting,Min guard Lower Body Bathing: Minimal assistance,Sit to/from stand Upper Body Dressing : Minimal assistance,Sitting Lower Body Dressing: Minimal assistance,Sit to/from stand Lower Body Dressing Details (indicate cue type and reason): Educating pt on donning LB clothing with figure four method. Pt donning socks. Min A for balance in standing Toilet Transfer: Minimal assistance,Ambulation,Min guard (simulated in room) Toilet Transfer Details (indicate cue type and reason): Min A for gaining balance in standing Functional mobility during ADLs: Minimal assistance,Cane General ADL Comments: Pt performing oral care and then mobility in hallway. Pt with noting dragging of LLE and then several episodes of LOB; requiring Min A for fall prevention adn correction of balance  Cognition: Cognition Overall Cognitive  Status: Impaired/Different from baseline Orientation Level: Disoriented to place,Disoriented to time,Disoriented to situation,Oriented to person Cognition Arousal/Alertness: Lethargic,Suspect due to medications Behavior During Therapy: Impulsive,Restless Overall Cognitive Status: Impaired/Different from baseline Area of Impairment: Memory,Awareness,Safety/judgement Current Attention Level: Selective Memory: Decreased recall of precautions,Decreased short-term memory Following Commands: Follows one step commands consistently,Follows multi-step commands inconsistently Safety/Judgement: Decreased awareness of safety,Decreased awareness of deficits Awareness: Intellectual Problem Solving: Requires verbal cues,Difficulty sequencing,Requires tactile cues General Comments: Pt very lethargic with eyes closed most of session, opening them intermittently and briefly when cued. Pt unaware of safety as he had 2 large LOB bouts in standing needing maxA to recover. Pt thus returned to supine in bed for safety purposes. Pt unable to follow commands to perform exercises but follows simple multi-modal commands to perform bed mobility and transfers.  Physical Exam: Blood pressure (!) 145/106, pulse (!) 108, temperature 98.2 F (36.8 C), temperature source Oral, resp. rate 18, height 5\' 11"  (1.803 m), weight 99.8 kg, SpO2 95 %. Physical Exam  No results found for this or any previous visit (from the past 48 hour(s)). No results found.     Medical Problem List and Plan: 1.  *** secondary to ***  -patient may *** shower  -ELOS/Goals: *** 2.  Antithrombotics: -  DVT/anticoagulation:  Mechanical: Sequential compression devices, below knee Bilateral lower extremities--Eliquis on hold due to surgery  -antiplatelet therapy: N/AA 3. Pain Management: Has been on multiple narcotics--hydromorphone, oxycodone and hydrocodone prn.  4. Mood: LCSW to follow for evaluation and support  -antipsychotic agents: N/AA 5.  Neuropsych: This patient is not fully capable of making decisions on his own behalf. 6. Skin/Wound Care: Routine pressure-relief measures.  --Monitor incision for healing. 7. Fluids/Electrolytes/Nutrition: Monitor intake and output.  Check labs in a.m. 8.  HTN: Monitor blood pressures TID- has been labile. --Continue HCTZ, Zestril, metoprolol. 9.  History of A. PAF: Monitor heart rate 3 times daily and for changes with increase in activity. 10.  Hypokalemia:      ***  Bary Leriche, PA-C 04/30/2020

## 2020-04-30 NOTE — Progress Notes (Signed)
Physical Therapy Treatment Patient Details Name: Patrick Gray MRN: [id number] DOB: 11-05-1955 Today's Date: 04/30/2020    History of Present Illness 65 yo male admitted 04/26/20 s/p ACDF C5-7. s/p MRI on 04/28/20 which showed only a residual acoustic neuroma tumor that will be followed with serial scans as an OP per neurosurgery.  PMH including tremor, PAF, HTN, deafness in L ear, L TSA, acoustic neuroma resection in 1999 L.    PT Comments    Pt limited this date by extreme lethargy with him keeping his eyes closed majority of session. Per RN and chart review, pt was up last night and combative and has a hx PTA of being awake and confused at night. RN cleared pt for session. Due to pt's lethargy he was following less commands and had decreased awareness of his safety, resulting in x2 large bouts of LOB in an anterior-posterior direction when standing with a RW this date, needing maxA to recover and return pt safely to bed. Pt unable to follow directions for lower extremity exercises either. Thus ended session with pt supine in bed with bed alarm on and restraints reapplied. RN made aware. Will continue to follow acutely. Current recommendations remain appropriate. If time permits, will attempt session again later in day if pt is less lethargic and able to participate more.    Follow Up Recommendations  CIR     Equipment Recommendations  Rolling walker with 5" wheels    Recommendations for Other Services       Precautions / Restrictions Precautions Precautions: Fall;Cervical Precaution Booklet Issued: Yes (comment) Precaution Comments: handout present in room; reminded pt of precautions Required Braces or Orthoses: Cervical Brace Cervical Brace: Soft collar;At all times Restrictions Weight Bearing Restrictions: No    Mobility  Bed Mobility Overal bed mobility: Needs Assistance Bed Mobility: Rolling;Sidelying to Sit;Sit to Supine Rolling: Min guard Sidelying to sit: Min assist      Sit to sidelying: Min assist General bed mobility comments: Pt lethargic, opening eyes only briefly on occasion. Needing extra time and close min guard-minA to complete all tasks, suspecting due to lethargy rather than strength deficits. Cues to maintain spinal precautions, pt did log roll but initially went to opposite side of bed. Bed flat.    Transfers Overall transfer level: Needs assistance Equipment used: Rolling walker (2 wheeled) Transfers: Sit to/from Stand Sit to Stand: Min assist         General transfer comment: MinA to power up to stand with extra effort from low sitting bed, likely increased assist due to lethargy.  Ambulation/Gait Ambulation/Gait assistance: Max assist;Min assist Gait Distance (Feet): 6 Feet Assistive device: Rolling walker (2 wheeled) Gait Pattern/deviations: Step-through pattern;Shuffle;Trunk flexed Gait velocity: reduced Gait velocity interpretation: <1.31 ft/sec, indicative of household ambulator General Gait Details: Pt ambulating forward with eyes closed, cued to open and pay attention to his safety with no success. MinA initially but displayed x2 large LOB bouts, 1x posteriorly and 1x anteriorly, needing maxA to recover balance and cue pt to step backwards to EOB to return to bed for safety purposes.   Stairs             Wheelchair Mobility    Modified Rankin (Stroke Patients Only)       Balance Overall balance assessment: Needs assistance Sitting-balance support: Feet supported;Bilateral upper extremity supported Sitting balance-Leahy Scale: Poor Sitting balance - Comments: UE support, min guard for safety.   Standing balance support: Bilateral upper extremity supported Standing balance-Leahy Scale: Poor Standing  balance comment: x2 LOB needing maxA to recover with bil UE support on RW this date.                            Cognition Arousal/Alertness: Lethargic;Suspect due to medications Behavior During Therapy:  Impulsive;Restless Overall Cognitive Status: Impaired/Different from baseline Area of Impairment: Memory;Awareness;Safety/judgement                     Memory: Decreased recall of precautions;Decreased short-term memory   Safety/Judgement: Decreased awareness of safety;Decreased awareness of deficits Awareness: Intellectual   General Comments: Pt very lethargic with eyes closed most of session, opening them intermittently and briefly when cued. Pt unaware of safety as he had 2 large LOB bouts in standing needing maxA to recover. Pt thus returned to supine in bed for safety purposes. Pt unable to follow commands to perform exercises but follows simple multi-modal commands to perform bed mobility and transfers.      Exercises      General Comments General comments (skin integrity, edema, etc.): Pt unable to follow directions for attempted lower extremity exercises.      Pertinent Vitals/Pain Pain Assessment: Faces Faces Pain Scale: Hurts a little bit Pain Location: neck Pain Descriptors / Indicators: Grimacing;Guarding Pain Intervention(s): Monitored during session;Limited activity within patient's tolerance;Repositioned    Home Living                      Prior Function            PT Goals (current goals can now be found in the care plan section) Acute Rehab PT Goals Patient Stated Goal: did not state PT Goal Formulation: With patient Time For Goal Achievement: 05/11/20 Potential to Achieve Goals: Good Progress towards PT goals: Progressing toward goals (limited by lethargy this date though)    Frequency    Min 5X/week      PT Plan Current plan remains appropriate    Co-evaluation              AM-PAC PT "6 Clicks" Mobility   Outcome Measure  Help needed turning from your back to your side while in a flat bed without using bedrails?: A Little Help needed moving from lying on your back to sitting on the side of a flat bed without using  bedrails?: A Little Help needed moving to and from a bed to a chair (including a wheelchair)?: A Little Help needed standing up from a chair using your arms (e.g., wheelchair or bedside chair)?: A Little Help needed to walk in hospital room?: A Little Help needed climbing 3-5 steps with a railing? : A Little 6 Click Score: 18    End of Session Equipment Utilized During Treatment: Cervical collar;Other (comment) (soft collar) Activity Tolerance: Patient limited by lethargy Patient left: with call bell/phone within reach;in bed;with bed alarm set;with restraints reapplied Nurse Communication: Mobility status (pt lethargy and LOB bouts, safety concerns) PT Visit Diagnosis: Muscle weakness (generalized) (M62.81);Difficulty in walking, not elsewhere classified (R26.2);Other symptoms and signs involving the nervous system (R29.898);Unsteadiness on feet (R26.81);Other abnormalities of gait and mobility (R26.89)     Time: 2355-7322 PT Time Calculation (min) (ACUTE ONLY): 14 min  Charges:  $Therapeutic Activity: 8-22 mins                     Moishe Spice, PT, DPT Acute Rehabilitation Services  Pager: [phone] Office: [phone]    Marygrace Drought  M Pettis 04/30/2020, 9:12 AM

## 2020-05-01 DIAGNOSIS — F05 Delirium due to known physiological condition: Secondary | ICD-10-CM

## 2020-05-01 DIAGNOSIS — G959 Disease of spinal cord, unspecified: Secondary | ICD-10-CM

## 2020-05-01 DIAGNOSIS — R251 Tremor, unspecified: Secondary | ICD-10-CM

## 2020-05-01 LAB — HEMOGLOBIN A1C
Hgb A1c MFr Bld: 5.2 % (ref 4.8–5.6)
Mean Plasma Glucose: 102.54 mg/dL

## 2020-05-01 MED ORDER — QUETIAPINE FUMARATE 50 MG PO TABS
25.0000 mg | ORAL_TABLET | Freq: Every evening | ORAL | Status: DC | PRN
  Administered 2020-05-01 – 2020-05-02 (×2): 25 mg via ORAL
  Filled 2020-05-01 (×2): qty 1

## 2020-05-01 MED ORDER — OXYCODONE-ACETAMINOPHEN 5-325 MG PO TABS: 1.0000 | ORAL_TABLET | ORAL | 0 refills | Status: DC | PRN

## 2020-05-01 MED ORDER — MELATONIN 3 MG PO TABS
3.0000 mg | ORAL_TABLET | Freq: Every day | ORAL | Status: DC
  Administered 2020-05-01 – 2020-05-02 (×2): 3 mg via ORAL
  Filled 2020-05-01 (×2): qty 1

## 2020-05-01 NOTE — Progress Notes (Signed)
Subjective: Patient reports some neck soreness but doing better this morning from a cognition perspective.   Objective: Vital signs in last 24 hours: Temp:  [97.6 F (36.4 C)-98.3 F (36.8 C)] 97.7 F (36.5 C) (03/12 0716) Pulse Rate:  [105-114] 106 (03/12 0716) Resp:  [15-20] 15 (03/12 0716) BP: (109-170)/(82-122) 129/98 (03/12 0716) SpO2:  [92 %-99 %] 96 % (03/12 0716)  Intake/Output from previous day: 03/11 0701 - 03/12 0700 In: 238 [P.O.:238] Out: 1750 [Urine:1750] Intake/Output this shift: No intake/output data recorded.  Neurologic: Grossly normal  Lab Results: Lab Results  Component Value Date   WBC 6.4 04/30/2020   HGB 15.5 04/30/2020   HCT 43.8 04/30/2020   MCV 89.8 04/30/2020   PLT 196 04/30/2020   Lab Results  Component Value Date   INR 1.19 06/08/2016   BMET Lab Results  Component Value Date   NA 137 04/30/2020   K 3.1 (L) 04/30/2020   CL 100 04/30/2020   CO2 29 04/30/2020   GLUCOSE 120 (H) 04/30/2020   BUN 12 04/30/2020   CREATININE 0.72 04/30/2020   CALCIUM 9.6 04/30/2020    Studies/Results: No results found.  Assessment/Plan: 65 year old s/p acdf 3/7 for myelopathy. Has had some waxing and waning from a neurologic perspective. Seems much clearer today and making sense. Neurology planning to see him today for parkinsons symptoms and encephalopathy. Ok to discharge to rehab if neurology clears him.    LOS: 4 days    Ocie Cornfield Alexzavier Girardin 05/01/2020, 8:21 AM

## 2020-05-01 NOTE — Progress Notes (Signed)
Patient has slept pretty good overnight. He is alert and oriented at place and person. Easy to wake up without difficulties. He stood and walked with a walker several times between room and bathroom. He laid down and fell asleep after RN reoriented and redirected him. Bed alarm activated. Vital signs are stable. Will continue to assess.

## 2020-05-01 NOTE — Consult Note (Addendum)
Neurology Consultation Reason for Consult: post op delirium   Requesting Physician: Saintclair Halsted  CC: altered consciousness/delirium  History is obtained from chart, patient's wife and patient at bedside.   HPI: Patrick Gray is a 65 y.o. Gray  with history of PAF, tremors, left acoustic neuroma s/p resection, progressive neck pain with left arm pain numbness and weakness secondary to large herniated C6-C7 disc with moderate spondylosis and cervical stenosis at C5-C6.  He also has a history of a tremor affecting left hand. Patient was noted to have myelopathy with failure of conservative therapy and he was admitted on 03/07 for ACDF C5-C7 by Dr. Saintclair Halsted.  He reported decrease in numbness as well as decrease in tremors post surgery.  However, postop patient developed confusion with agitation, neck pain as well as sleep-wake disruption.  Wife reported history of nighttime confusion for few weeks prior to admission, as well as balance/gait abnormalities.  Regarding his tremor, patient denies familial tremor but wife reports that his mother did have a very similar tremor which involved bilateral hands and head but not legs.  She was quite functional even into her later age and this tremor was not disabling but was prominent and quite similar to the patient's tremor which is currently much milder.  Mother's tremor did progress over time.  Patient reports that the tremor is worse when he is anxious.  He is not sure if it is worse with caffeine or improved with alcohol use although he notes he minimizes alcohol use due to his cardiac issues.  There was some concern for potential Parkinson's disease prior to surgery due to the tremor as well as the patient's shuffling gait.  Regarding his confusion at home, this was in the setting of very poor sleep secondary to the pain.  Patient reports that he was sleeping so little at night that he was constantly falling asleep during the day and did not even feel safe driving due to  his sleepiness.  He did receive a dose of Haldol on 3/7 for agitation and continued to be agitated on 3/8.  Wife notes that there was no severe bradycardia kinesis or stiffness after the Haldol that he received, and that his agitation improved once neurogenic bladder was detected and relieved with Foley catheter placement.  He was noted to have balance deficits with ongoing confusion and developed progressive lethargy after dose of Ativan for agitation on 03/11.  UA was ordered to rule out infection and neurology was consulted for input on encephalopathy.    MRI brain with and without contrast obtained on 3/9 showed a 23 mm intra and extra canalicular mass on the left, consistent with vestibular schwannoma as well as moderate chronic small vessel ischemic changes at the pons.   He reports that noise at hospital at bedtime keeps him awake at night.   This morning both patient and wife feels that he is much improved from his prior delirium.  ROS: All other review of systems was negative except as noted in the HPI.   Unable to obtain due to altered mental status.   Past Medical History:  Diagnosis Date  . Anxiety   . Arthritis   . Deafness in left ear   . Dysrhythmia    PAF  . H/O cardiac radiofrequency ablation   . HTN (hypertension)   . Hypercholesterolemia   . Hypopotassemia   . Paroxysmal atrial fibrillation (HCC)   . Tremor    left hand     Family History  Problem Relation Age of Onset  . Hypertension Father   . Hypertension Mother   . Stroke Mother   . CVA Mother   . Hypertension Brother   . Hypercholesterolemia Brother   . Atrial fibrillation Brother   . Congestive Heart Failure Brother       Social History:  reports that he has quit smoking. He has never used smokeless tobacco. He reports current alcohol use of about 14.0 standard drinks of alcohol per week. He reports that he does not use drugs.    Exam: Current vital signs: BP 109/84 (BP Location: Left Arm)    Pulse (!) 107   Temp 98.3 F (36.8 C) (Oral)   Resp 15   Ht 5\' 11"  (1.803 m)   Wt 99.8 kg   SpO2 97%   BMI 30.68 kg/m  Vital signs in last 24 hours: Temp:  [97.6 F (36.4 C)-98.3 F (36.8 C)] 98.3 F (36.8 C) (03/12 0318) Pulse Rate:  [105-114] 107 (03/12 0318) Resp:  [15-20] 15 (03/12 0318) BP: (109-170)/(82-122) 109/84 (03/12 0318) SpO2:  [92 %-99 %] 97 % (03/12 0318)   Physical Exam  Constitutional: Appears well-developed and well-nourished.  Psych: Affect appropriate to situation,  Pleasant and cooperative.  Eyes: No scleral injection HENT: No oropharyngeal obstruction.  MSK: no joint deformities.  Cardiovascular: Normal rate and regular rhythm.  Respiratory: Effort normal, non-labored breathing GI: Soft.  No distension. There is no tenderness.  Skin: Warm dry and intact visible skin  Neuro: Mental Status: Patient is awake, alert, oriented to person, place, month, year, and situation.  Patient is able to give a clear and coherent history.  No signs of aphasia or neglect   Cranial Nerves: II: Visual Fields are full. Pupils are equal, round, and reactive to light.    III,IV, VI: EOMI without ptosis or diploplia.  V: Facial sensation is symmetric to temperature VII: Facial movement is symmetric.  VIII: hearing is intact to voice X: Uvula elevates symmetrically XI: Shoulder shrug is symmetric. XII: tongue is midline without atrophy or fasciculations.  Motor: Tone is mildly spastic.  No cogwheeling. Bulk is normal. 5/5 strength was present in all four extremities.   There is a low amplitude moderate frequency tremor that improves at rest and is worsened with activity predominantly in the left upper extremity.  No clear decrementing of repetitive finger tapping (possibly some slight decrementing in the right hand only), no decrementing of tapping bilaterally. Sensory: Sensation is symmetric to light touch and temperature in the arms and legs.  Deep Tendon  Reflexes: 3+ and symmetric in the biceps and patellae.   Cerebellar: Finger-to-nose is notable for some dysmetria bilaterally attributable to his baseline tremor  I have reviewed labs in epic and the results pertinent to this consultation are: Creatinine 0.72, hemoglobin A1c 5.2, normal CBC No results found for: VITAMINB12  No results found for: TSH   I have reviewed the images obtained:  MRI brain w/ c/f left vestibular schwannoma  1. 23 x 16 x 14 mm intra and extra canalicular mass on the left, bulging into the CP angle cistern, consistent with vestibular schwannoma. 2. Mild to moderate chronic small-vessel ischemic changes of the pons and cerebral hemispheric Pfost matter.  Impression:  65 year old Patrick Gray with history of vestibular schwannoma, left hand parkinsonian type tremor. Paroxysmal atrial fibrillation on metoprolol and eliquis, admitted for ACDF C5-C7 on 3/7 with postoperative confusion and delirium. Mentation is near baseline at this point, per patient and wife.  -  Urinalysis shows no acute infection.  - MRI brain shows known vestibular schwannoma. -Comprehensive metabolic panel is unremarkable.  No further neurologic workup indicated and we will sign off at this time. Patient is cleared for rehab.   Suspect he has an essential tremor which is best further evaluated on an outpatient basis  Recommendations:  Given his propensity to acute delirium, will complete reversible causes of dementia work-up -TSH -B12 -RPR -HIV -Thiamine  -These labs can be followed up on an outpatient basis, as patient ready for rehab -Melatonin 3 mg nightly to promote sleep -Seroquel 25 mg nightly as needed only while in the hospital/rehab to promote sleep -Please resume apixaban when considered safe from a neurosurgical perspective for stroke prevention -Ambulatory referral to [facility] for evaluation of tremor and potential neurocognitive testing  Patient seen  with assistance of PA Cecile Hearing, with history, examination, chart review, performed by myself as well and assessment and plan formulated by myself as above  [employer] [phone] Available 7 AM to 7 PM, outside these hours please contact Neurologist on call listed on AMION

## 2020-05-01 NOTE — Plan of Care (Signed)
  Problem: Clinical Measurements: Goal: Ability to maintain clinical measurements within normal limits will improve Outcome: Progressing   

## 2020-05-02 LAB — TSH: TSH: 2.727 u[IU]/mL (ref 0.350–4.500)

## 2020-05-02 LAB — VITAMIN B12: Vitamin B-12: 155 pg/mL — ABNORMAL LOW (ref 180–914)

## 2020-05-02 LAB — RPR: RPR Ser Ql: NONREACTIVE

## 2020-05-02 LAB — HIV ANTIBODY (ROUTINE TESTING W REFLEX): HIV Screen 4th Generation wRfx: NONREACTIVE

## 2020-05-02 MED ORDER — VITAMIN B-12 1000 MCG PO TABS: 1000.0000 ug | ORAL_TABLET | Freq: Every day | ORAL | Status: DC

## 2020-05-02 MED ORDER — BISACODYL 5 MG PO TBEC
5.0000 mg | DELAYED_RELEASE_TABLET | Freq: Every day | ORAL | Status: DC | PRN
  Administered 2020-05-02: 5 mg via ORAL
  Filled 2020-05-02: qty 1

## 2020-05-02 MED ORDER — CYANOCOBALAMIN 1000 MCG/ML IJ SOLN
1000.0000 ug | INTRAMUSCULAR | Status: DC
Start: 2020-05-09 — End: 2020-05-03

## 2020-05-02 MED ORDER — SENNA 8.6 MG PO TABS
1.0000 | ORAL_TABLET | Freq: Every day | ORAL | Status: DC
  Administered 2020-05-02 – 2020-05-03 (×2): 8.6 mg via ORAL
  Filled 2020-05-02 (×2): qty 1

## 2020-05-02 MED ORDER — CYANOCOBALAMIN 1000 MCG/ML IJ SOLN
1000.0000 ug | Freq: Every day | INTRAMUSCULAR | Status: DC
  Administered 2020-05-02 – 2020-05-03 (×2): 1000 ug via INTRAMUSCULAR
  Filled 2020-05-02 (×2): qty 1

## 2020-05-02 NOTE — Progress Notes (Signed)
Patient resistant to bladder scanning throughout the day, stating "I can pee eventually".  Abdomen obviously distended / firm.  Bladder scan reveals >500 ml urine in bladder.  I&O cath =  550 ml

## 2020-05-02 NOTE — Progress Notes (Signed)
Subjective: Patient reports doing well, no acute events overnight.   Objective: Vital signs in last 24 hours: Temp:  [97.4 F (36.3 C)-98.6 F (37 C)] 97.8 F (36.6 C) (03/13 0326) Pulse Rate:  [67-106] 106 (03/13 0400) Resp:  [11-20] 18 (03/13 0400) BP: (114-132)/(82-98) 125/98 (03/13 0326) SpO2:  [93 %-96 %] 93 % (03/13 0400)  Intake/Output from previous day: 03/12 0701 - 03/13 0700 In: 1460 [P.O.:1460] Out: 2225 [Urine:2225] Intake/Output this shift: No intake/output data recorded.  Neurologic: Grossly normal  Lab Results: Lab Results  Component Value Date   WBC 6.4 04/30/2020   HGB 15.5 04/30/2020   HCT 43.8 04/30/2020   MCV 89.8 04/30/2020   PLT 196 04/30/2020   Lab Results  Component Value Date   INR 1.19 06/08/2016   BMET Lab Results  Component Value Date   NA 137 04/30/2020   K 3.1 (L) 04/30/2020   CL 100 04/30/2020   CO2 29 04/30/2020   GLUCOSE 120 (H) 04/30/2020   BUN 12 04/30/2020   CREATININE 0.72 04/30/2020   CALCIUM 9.6 04/30/2020    Studies/Results: No results found.  Assessment/Plan: Continue to work with PT/OT. Ok to dc to CIR when available. Will dc foley today and see how he does, already on flomax. Will work on bm today as well.   LOS: 5 days    Patrick Gray 05/02/2020, 8:21 AM

## 2020-05-02 NOTE — Progress Notes (Signed)
Bladder scanned patient with 328cc's of urine showing in bladder. Patient wants to wait "a couple more hours" to see if he voids on his own. I told him that at 0100 I will In and Out cath him if he hasn't voided by then. Patient agreeable to this.

## 2020-05-02 NOTE — Evaluation (Signed)
Occupational Therapy Evaluation Patient Details Name: Patrick Gray MRN: [id number] DOB: 11/17/1955 Today's Date: 05/02/2020    History of Present Illness 65 yo male admitted 04/26/20 s/p ACDF C5-7. s/p MRI on 04/28/20 which showed only a residual acoustic neuroma tumor that will be followed with serial scans as an OP per neurosurgery.  PMH including tremor, PAF, HTN, deafness in L ear, L TSA, acoustic neuroma resection in 1999 L.   Clinical Impression   Pt progressing. Pt with increasing ability to follow  1-2 step commands as mentation has improved, perform self care and ambulate in room, hallway and navigate 6 steps up/down with minguardA. Pt using figure 4 technique for LB ADL. Pt's spouse in room and very supportive of pt. Pt would benefit from continued OT skilled services. Recommendation changed to [facility]. Pt in need of RW and 3in1 for safety at home. OT following acutely.    Follow Up Recommendations  Home health OT;Supervision/Assistance - 24 hour (initially)    Equipment Recommendations  3 in 1 bedside commode;Other (comment) (rolling walker)    Recommendations for Other Services       Precautions / Restrictions Precautions Precautions: Fall;Cervical Precaution Booklet Issued: Yes (comment) Precaution Comments: handout present in room; reminded pt of precautions Required Braces or Orthoses: Cervical Brace Cervical Brace: Soft collar;For comfort Restrictions Weight Bearing Restrictions: No      Mobility Bed Mobility Overal bed mobility: Needs Assistance Bed Mobility: Rolling;Sidelying to Sit Rolling: Modified independent (Device/Increase time) Sidelying to sit: Modified independent (Device/Increase time)       General bed mobility comments: log roll; no physical assist; no rail use    Transfers Overall transfer level: Needs assistance Equipment used: Rolling walker (2 wheeled) Transfers: Sit to/from Stand Sit to Stand: Min guard         General transfer comment:  minguardA for stability with RW    Balance Overall balance assessment: Needs assistance Sitting-balance support: Feet supported Sitting balance-Leahy Scale: Good     Standing balance support: Bilateral upper extremity supported;No upper extremity supported Standing balance-Leahy Scale: Fair Standing balance comment: no LOB; pt getting close to items on L, but never bumping into them. Pt using RW for stability; no UE use for light grooming at sink.                           ADL either performed or assessed with clinical judgement   ADL Overall ADL's : Needs assistance/impaired     Grooming: Supervision/safety;Standing Grooming Details (indicate cue type and reason): standing at sink x5 mins for grooming; able to use cup to reduce bending over at sink             Lower Body Dressing: Min guard;Sitting/lateral leans;Sit to/from stand Lower Body Dressing Details (indicate cue type and reason): figure 4 technique to practtice. Pt's spouse reports that she can assist as needed. Toilet Transfer: Min guard;Ambulation;RW;Grab bars Toilet Transfer Details (indicate cue type and reason): did not require BSC over commode, but would benefit from [facility] over commode at home Toileting- Clothing Manipulation and Hygiene: Supervision/safety;Sitting/lateral lean;Sit to/from stand       Functional mobility during ADLs: Min guard;Rolling walker;Cueing for safety General ADL Comments: Pt with increasing ability to follow  1-2 step commands, perform self care and ambulate in room, hallway and navigate 6 steps up/down with minguardA. pt     Vision   Vision Assessment?: No apparent visual deficits     Perception  Praxis      Pertinent Vitals/Pain Pain Assessment: Faces Faces Pain Scale: Hurts a little bit Pain Location: neck Pain Descriptors / Indicators: Sore Pain Intervention(s): Monitored during session     Hand Dominance     Extremity/Trunk Assessment Upper Extremity  Assessment Upper Extremity Assessment: Overall WFL for tasks assessed;RUE deficits/detail RUE Deficits / Details: WFL for strength, at times tremors present LUE Deficits / Details: WFL for strength. Noting resting tremors   Lower Extremity Assessment Lower Extremity Assessment: Overall WFL for tasks assessed       Communication     Cognition Arousal/Alertness: Awake/alert Behavior During Therapy: WFL for tasks assessed/performed Overall Cognitive Status: Within Functional Limits for tasks assessed                                 General Comments: Pt much more alert today and A/O x4 (assist with date, but pt usually uses calendar or phone to remind him). Pt following all commands (1-2 step commands). Spouse in room and pt appears much closer to baseline. Pt reported hallucinations prior to arrival to hospital and he can recall all events leading up to hospitalization and then some harsh behavior after surgery.   General Comments  Pt following all commands    Exercises     Shoulder Instructions      Home Living                                          Prior Functioning/Environment                   OT Problem List:        OT Treatment/Interventions:      OT Goals(Current goals can be found in the care plan section) Acute Rehab OT Goals Patient Stated Goal: to go home OT Goal Formulation: With patient/family Time For Goal Achievement: 05/11/20 Potential to Achieve Goals: Good ADL Goals Pt Will Perform Grooming: with modified independence;standing Pt Will Perform Lower Body Dressing: with supervision;sit to/from stand Pt Will Transfer to Toilet: with supervision;ambulating;regular height toilet Pt Will Perform Toileting - Clothing Manipulation and hygiene: with supervision;sit to/from stand;sitting/lateral leans Pt Will Perform Tub/Shower Transfer: with supervision;shower seat;ambulating;Tub transfer Additional ADL Goal #1: Pt will  independently verbalize cervical precautions Additional ADL Goal #2: Pt will perform bed mobility using log roll technique with Supervision in preparation for ADLs Additional ADL Goal #3: Pt will demonstrate increased ST memory to recall three grooming tasks with 2-3 cues  OT Frequency: Min 2X/week   Barriers to D/C:            Co-evaluation              AM-PAC OT "6 Clicks" Daily Activity     Outcome Measure Help from another person eating meals?: None Help from another person taking care of personal grooming?: A Little Help from another person toileting, which includes using toliet, bedpan, or urinal?: A Little Help from another person bathing (including washing, rinsing, drying)?: A Little Help from another person to put on and taking off regular upper body clothing?: A Little Help from another person to put on and taking off regular lower body clothing?: A Little 6 Click Score: 19   End of Session Equipment Utilized During Treatment: Cervical collar;Rolling walker Nurse Communication: Mobility status  Activity  Tolerance: Patient tolerated treatment well Patient left: in chair;with call bell/phone within reach;with chair alarm set;with family/visitor present  OT Visit Diagnosis: Unsteadiness on feet (R26.81);Pain Pain - part of body:  (neck)                Time: 1027-1100 OT Time Calculation (min): 33 min Charges:  OT General Charges $OT Visit: 1 Visit OT Treatments $Self Care/Home Management : 8-22 mins $Therapeutic Activity: 8-22 mins  Jefferey Pica, OTR/L Acute Rehabilitation Services Pager: [phone] Office: [phone]   Tallie Hevia C 05/02/2020, 11:35 AM

## 2020-05-02 NOTE — Progress Notes (Signed)
Inpatient Rehab Admissions Coordinator:  Noted pt's mentation has greatly improved since Friday.  Also noted that pt's mobility, ability to follow commands, and perform ADLS has also improved. Pt no longer warrants the intensity of CIR therapy.  Dr. Posey Pronto spoke with pt who is also in agreement with this.  TOC made aware.  Will sign off.   Gayland Curry, [place], [place] Admissions Coordinator [phone]

## 2020-05-02 NOTE — TOC Transition Note (Signed)
Transition of Care [facility]) - CM/SW Discharge Note   Patient Details  Name: XANDER JUTRAS MRN: [id number] Date of Birth: [birth year]  Transition of Care [facility]) CM/SW Contact:  Carles Collet, RN Phone Number: 05/02/2020, 12:24 PM   Clinical Narrative:   Damaris Schooner w patient and wife at bedside. Discussed DC plan. Informed that CIR declined due to his improved mobility and his progress. They were excited about tthe possibility of discharging to home. Patient would like to go home today, after he voids. Discussed this with Truett Mainland NP, and instructed bedside nurse to call her once he voids.  Discussed HH providers, Alvis Lemmings accepted referral.  Patient will need RW, order placed and [employer] to deliver to room today. Patient and wife declined 3/1.   Needs HH orders. NP to place when [place].   No other CM needs identified at this time.     Final next level of care: Home w Home Health Services Barriers to Discharge: Other (comment) (needs to void)   Patient Goals and CMS Choice Patient states their goals for this hospitalization and ongoing recovery are:: to go home CMS Medicare.gov Compare Post Acute Care list provided to:: Patient Choice offered to / list presented to : [facility]  Discharge Placement                       Discharge Plan and Services                DME Arranged: Gilford Rile rolling DME Agency: [employer] Date DME Agency Contacted: 05/02/20 Time DME Agency Contacted: 1517 Representative spoke with at DME Agency: [place]: PT,OT [place]: [place] Date [place]: 05/02/20 Time [place]: 1224 Representative spoke with at [place]: [place] ([place]) Interventions     Readmission Risk Interventions No flowsheet data found.

## 2020-05-02 NOTE — Progress Notes (Signed)
Foley removed.  Awaiting for patient to void.

## 2020-05-02 NOTE — Progress Notes (Signed)
Patient expressing concerns re: ongoing constipation issues.  Also wondering about follow-up with urinary retention.  Does have foley catheter and is on Flomax.  Continues to have insomnia despite use of medication.

## 2020-05-03 MED ORDER — METHOCARBAMOL 500 MG PO TABS: 500.0000 mg | ORAL_TABLET | Freq: Four times a day (QID) | ORAL | 0 refills | Status: DC

## 2020-05-03 NOTE — Discharge Summary (Signed)
Physician Discharge Summary  Patient ID: Patrick Gray MRN: [id number] DOB/AGE: 04-25-1955 65 y.o.  Admit date: 04/26/2020 Discharge date: 05/03/2020  Admission Diagnoses: Cervical spondylitic myelopathy from severe cervical stenosis C56 large herniated nucleus pulposus C6-7    Discharge Diagnoses: same   Discharged Condition: good  Hospital Course: The patient was admitted on 04/26/2020 and taken to the operating room where the patient underwent acdf C5-6, 6-7. The patient tolerated the procedure well and was taken to the recovery room and then to the floor in stable condition. The hospital course was routine. There were no complications. The wound remained clean dry and intact. Pt had appropriate neck soreness. No complaints of arm pain or new N/T/W. The patient remained afebrile with stable vital signs, and tolerated a regular diet. The patient continued to increase activities, and pain was well controlled with oral pain medications.   Consults: neurology  Significant Diagnostic Studies:  Results for orders placed or performed during the hospital encounter of 04/26/20  Comprehensive metabolic panel per protocol  Result Value Ref Range   Sodium 138 135 - 145 mmol/L   Potassium 3.3 (L) 3.5 - 5.1 mmol/L   Chloride 103 98 - 111 mmol/L   CO2 24 22 - 32 mmol/L   Glucose, Bld 100 (H) 70 - 99 mg/dL   BUN 19 8 - 23 mg/dL   Creatinine, Ser 0.74 0.61 - 1.24 mg/dL   Calcium 9.5 8.9 - 10.3 mg/dL   Total Protein 6.4 (L) 6.5 - 8.1 g/dL   Albumin 4.0 3.5 - 5.0 g/dL   AST 71 (H) 15 - 41 U/L   ALT 84 (H) 0 - 44 U/L   Alkaline Phosphatase 58 38 - 126 U/L   Total Bilirubin 1.5 (H) 0.3 - 1.2 mg/dL   GFR, Estimated >60 >60 mL/min   Anion gap 11 5 - 15  CBC per protocol  Result Value Ref Range   WBC 4.3 4.0 - 10.5 K/uL   RBC 4.05 (L) 4.22 - 5.81 MIL/uL   Hemoglobin 13.1 13.0 - 17.0 g/dL   HCT 37.2 (L) 39.0 - 52.0 %   MCV 91.9 80.0 - 100.0 fL   MCH 32.3 26.0 - 34.0 pg   MCHC 35.2 30.0 - 36.0  g/dL   RDW 13.5 11.5 - 15.5 %   Platelets 165 150 - 400 K/uL   nRBC 0.0 0.0 - 0.2 %  Basic metabolic panel  Result Value Ref Range   Sodium 140 135 - 145 mmol/L   Potassium 3.2 (L) 3.5 - 5.1 mmol/L   Chloride 104 98 - 111 mmol/L   CO2 26 22 - 32 mmol/L   Glucose, Bld 106 (H) 70 - 99 mg/dL   BUN 9 8 - 23 mg/dL   Creatinine, Ser 0.63 0.61 - 1.24 mg/dL   Calcium 8.9 8.9 - 10.3 mg/dL   GFR, Estimated >60 >60 mL/min   Anion gap 10 5 - 15  CBC  Result Value Ref Range   WBC 6.4 4.0 - 10.5 K/uL   RBC 4.88 4.22 - 5.81 MIL/uL   Hemoglobin 15.5 13.0 - 17.0 g/dL   HCT 43.8 39.0 - 52.0 %   MCV 89.8 80.0 - 100.0 fL   MCH 31.8 26.0 - 34.0 pg   MCHC 35.4 30.0 - 36.0 g/dL   RDW 13.0 11.5 - 15.5 %   Platelets 196 150 - 400 K/uL   nRBC 0.0 0.0 - 0.2 %  Basic metabolic panel  Result Value Ref Range  Sodium 137 135 - 145 mmol/L   Potassium 3.1 (L) 3.5 - 5.1 mmol/L   Chloride 100 98 - 111 mmol/L   CO2 29 22 - 32 mmol/L   Glucose, Bld 120 (H) 70 - 99 mg/dL   BUN 12 8 - 23 mg/dL   Creatinine, Ser 0.72 0.61 - 1.24 mg/dL   Calcium 9.6 8.9 - 10.3 mg/dL   GFR, Estimated >60 >60 mL/min   Anion gap 8 5 - 15  Urinalysis, Routine w reflex microscopic Urine, Catheterized  Result Value Ref Range   Color, Urine AMBER (A) YELLOW   APPearance HAZY (A) CLEAR   Specific Gravity, Urine 1.019 1.005 - 1.030   pH 7.0 5.0 - 8.0   Glucose, UA NEGATIVE NEGATIVE mg/dL   Hgb urine dipstick SMALL (A) NEGATIVE   Bilirubin Urine NEGATIVE NEGATIVE   Ketones, ur 20 (A) NEGATIVE mg/dL   Protein, ur >=300 (A) NEGATIVE mg/dL   Nitrite NEGATIVE NEGATIVE   Leukocytes,Ua NEGATIVE NEGATIVE   RBC / HPF >50 (H) 0 - 5 RBC/hpf   WBC, UA 0-5 0 - 5 WBC/hpf   Bacteria, UA RARE (A) NONE SEEN   Squamous Epithelial / LPF 0-5 0 - 5   Mucus PRESENT   Hemoglobin A1c  Result Value Ref Range   Hgb A1c MFr Bld 5.2 4.8 - 5.6 %   Mean Plasma Glucose 102.54 mg/dL  TSH  Result Value Ref Range   TSH 2.727 0.350 - 4.500 uIU/mL   Vitamin B12  Result Value Ref Range   Vitamin B-12 155 (L) 180 - 914 pg/mL  RPR  Result Value Ref Range   RPR Ser Ql NON REACTIVE NON REACTIVE  HIV Antibody (routine testing w rflx)  Result Value Ref Range   HIV Screen 4th Generation wRfx Non Reactive Non Reactive    DG Cervical Spine 2-3 Views  Result Date: 04/26/2020 CLINICAL DATA:  C5-C7 ACDF. EXAM: OPERATIVE CERVICAL SPINE - 2-3 VIEW COMPARISON:  MRI cervical spine 04/11/2020. FINDINGS: Two LATERAL spot images from the C-arm fluoroscopic device are submitted for interpretation post-operatively. These demonstrate ACDF with hardware from C5 through C7 and interbody fusion plugs at C5-6 and C6-7. Alignment appears anatomic through the upper endplate of C7. IMPRESSION: ACDF with hardware C5-C7 and interbody fusion plugs at C5-6 and C6-7. Electronically Signed   By: Evangeline Dakin M.D.   On: 04/26/2020 15:51   MR BRAIN W WO CONTRAST  Result Date: 04/28/2020 CLINICAL DATA:  Mental status change.  Balance disturbance. EXAM: MRI HEAD WITHOUT AND WITH CONTRAST TECHNIQUE: Multiplanar, multiecho pulse sequences of the brain and surrounding structures were obtained without and with intravenous contrast. CONTRAST:  55mL GADAVIST GADOBUTROL 1 MMOL/ML IV SOLN COMPARISON:  None. FINDINGS: Brain: Diffusion imaging does not show any acute or subacute infarction. Chronic small-vessel ischemic changes affect pons. No focal cerebellar insult. Cerebral hemispheres show mild to moderate chronic small-vessel ischemic changes of the Vohs matter. No cortical or large vessel territory infarction. No evidence of primary or metastatic intra-axial mass lesion, hemorrhage, hydrocephalus or extra-axial collection. There is an intra and extra canalicular mass on the left, bulging into the CP angle cistern, measuring 23 x 16 x 14 mm, consistent with vestibular schwannoma. Vascular: Major vessels at the base of the brain show flow. Skull and upper cervical spine: Negative  Sinuses/Orbits: Clear/normal Other: None IMPRESSION: 1. 23 x 16 x 14 mm intra and extra canalicular mass on the left, bulging into the CP angle cistern, consistent with vestibular schwannoma.  2. Mild to moderate chronic small-vessel ischemic changes of the pons and cerebral hemispheric Mcnair matter. Electronically Signed   By: Nelson Chimes M.D.   On: 04/28/2020 12:32   MR CERVICAL SPINE W WO CONTRAST  Result Date: 04/11/2020 CLINICAL DATA:  Neck pain radiating into the left shoulder and mid back. Bilateral lower extremity numbness. History of a fall on ice 3-4 weeks ago. EXAM: MRI CERVICAL SPINE WITHOUT AND WITH CONTRAST TECHNIQUE: Multiplanar and multiecho pulse sequences of the cervical spine, to include the craniocervical junction and cervicothoracic junction, were obtained without and with intravenous contrast. CONTRAST:  20 mL MULTIHANCE GADOBENATE DIMEGLUMINE 529 MG/ML IV SOLN COMPARISON:  None. FINDINGS: Alignment: Maintained. Vertebrae: No fracture, evidence of discitis, or bone lesion. Cord: Normal signal throughout. No pathologic enhancement after contrast administration. Posterior Fossa, vertebral arteries, paraspinal tissues: Negative. Disc levels: C2-3: Negative. C3-4: Moderate facet arthropathy on the right. Minimal uncovertebral disease. No stenosis. C4-5: Left worse than right facet degenerative disease and a shallow disc bulge. No stenosis. C5-6: There is a disc bulge and superimposed small right paracentral protrusion. The patient's protrusion contacts the cord without deforming it. Foramina are open. C6-7: Disc bulge with a superimposed large protrusion in the left foraminal entry zone and foramen. Disc deflects and deforms the left hemicord and impinges on the left C7 root as it enters the left foramen and within the foramen. The right foramen is open. C6-7: Negative. C7-T1: Shallow central protrusion without stenosis. IMPRESSION: Dominant findings are at C6-7 where there is a large disc  protrusion in the left foraminal entry zone and foramen impinging on the left C7 root. The disc also mildly deforms the ventral cord at C6-7. Small right paracentral protrusion superimposed on a shallow bulge contacts the right hemicord without deforming it. Electronically Signed   By: Inge Rise M.D.   On: 04/11/2020 15:51   DG C-Arm 1-60 Min  Result Date: 04/26/2020 CLINICAL DATA:  C5-C7 ACDF. EXAM: OPERATIVE CERVICAL SPINE - 2-3 VIEW COMPARISON:  MRI cervical spine 04/11/2020. FINDINGS: Two LATERAL spot images from the C-arm fluoroscopic device are submitted for interpretation post-operatively. These demonstrate ACDF with hardware from C5 through C7 and interbody fusion plugs at C5-6 and C6-7. Alignment appears anatomic through the upper endplate of C7. IMPRESSION: ACDF with hardware C5-C7 and interbody fusion plugs at C5-6 and C6-7. Electronically Signed   By: Evangeline Dakin M.D.   On: 04/26/2020 15:51    Antibiotics:  Anti-infectives (From admission, onward)   Start     Dose/Rate Route Frequency Ordered Stop   04/26/20 2200  vancomycin (VANCOREADY) IVPB 1000 mg/200 mL  Status:  Discontinued        1,000 mg 200 mL/hr over 60 Minutes Intravenous Every 12 hours 04/26/20 1621 04/28/20 1351   04/26/20 0900  vancomycin (VANCOREADY) IVPB 1500 mg/300 mL        1,500 mg 150 mL/hr over 120 Minutes Intravenous On call to O.R. 04/26/20 0846 04/26/20 1210   04/26/20 0853  vancomycin (VANCOCIN) 1-5 GM/200ML-% IVPB  Status:  Discontinued       Note to Pharmacy: Laurita Quint   : cabinet override      04/26/20 0853 04/26/20 0935      Discharge Exam: Blood pressure (!) 145/92, pulse 90, temperature 98.1 F (36.7 C), temperature source Oral, resp. rate 20, height 5\' 11"  (1.803 m), weight 99.8 kg, SpO2 95 %. Neurologic: Grossly normal Ambulating well, no voiding well, incision cdi  Discharge Medications:   Allergies as of  05/03/2020      Reactions   Penicillins Rash, Other (See Comments)    Has patient had a PCN reaction causing immediate rash, facial/tongue/throat swelling, SOB or lightheadedness with hypotension: No Has patient had a PCN reaction causing severe rash involving mucus membranes or skin necrosis: No Has patient had a PCN reaction that required hospitalization No Has patient had a PCN reaction occurring within the last 10 years: No If all of the above answers are "NO", then may proceed with Cephalosporin use.      Medication List    TAKE these medications   atorvastatin 20 MG tablet Commonly known as: LIPITOR Take 20 mg by mouth every evening.   CoQ10 100 MG Caps Take 100 mg by mouth daily.   HYDROcodone-acetaminophen 5-325 MG tablet Commonly known as: NORCO/VICODIN Take 1-2 tablets by mouth every 6 (six) hours as needed (pain).   ibuprofen 200 MG tablet Commonly known as: ADVIL Take 400-600 mg by mouth daily as needed (for pain).   lisinopril-hydrochlorothiazide 20-25 MG tablet Commonly known as: ZESTORETIC Take 1 tablet by mouth daily.   methocarbamol 500 MG tablet Commonly known as: Robaxin Take 1 tablet (500 mg total) by mouth 4 (four) times daily.   metoprolol tartrate 50 MG tablet Commonly known as: LOPRESSOR TAKE 1 TABLET BY MOUTH TWICE DAILY MAY  TAKE  AN  EXTRA  ONE-HALF  TABLET  AS  NEEDED  FOR  BREAKTHROUGH  AFIB   oxyCODONE-acetaminophen 5-325 MG tablet Commonly known as: Percocet Take 1 tablet by mouth every 4 (four) hours as needed for severe pain.   potassium chloride SA 20 MEQ tablet Commonly known as: KLOR-CON Take 1 tablet (20 meq) by mouth in the morning & take 2 tablets (40 meq) by mouth in the evening.   QUERCETIN PO Take 1 capsule by mouth daily.   sildenafil 20 MG tablet Commonly known as: REVATIO Take 20-100 mg by mouth daily as needed for erectile dysfunction.   Vitamin D-3 125 MCG (5000 UT) Tabs Take 5,000 Units by mouth daily.   ZINC PO Take 1 tablet by mouth daily.            Durable Medical  Equipment  (From admission, onward)         Start     Ordered   05/02/20 1217  For home use only DME Walker rolling  Once       Question Answer Comment  Walker: With [place]   Patient needs a walker to treat with the following condition Weakness      05/02/20 1217          Disposition: home   Final Dx: ACDF C5-6, 6-7  Discharge Instructions     Remove dressing in 72 hours   Complete by: As directed    Ambulatory referral to Neurology   Complete by: As directed    An appointment is requested in approximately: 8 weeks --evaluation of tremor (possibly essential tremor) and consideration of neurocognitive testing   Call MD for:  difficulty breathing, headache or visual disturbances   Complete by: As directed    Call MD for:  persistant nausea and vomiting   Complete by: As directed    Call MD for:  redness, tenderness, or signs of infection (pain, swelling, redness, odor or green/yellow discharge around incision site)   Complete by: As directed    Call MD for:  severe uncontrolled pain   Complete by: As directed    Call MD for:  temperature >100.4   Complete by: As directed    Diet - low sodium heart healthy   Complete by: As directed    Driving Restrictions   Complete by: As directed    No driving for 2 weeks, no riding in the car for 1 week   Increase activity slowly   Complete by: As directed    Lifting restrictions   Complete by: As directed    No lifting more than 8 lbs       Follow-up Information    [facility] Follow up.   Specialty: Home Health Services Why: For home health services. They will call in 1-2 days to set up your first home appointment Contact information: [address] [place] [address] [phone]        [facility] Follow up in 2 day(s).   Contact information: [place] [phone]             **Make sure to follow up with urology  about leg bag catheter. Patient needs to call**   Signed: Ocie Cornfield [facility] 05/03/2020, 10:16 AM

## 2020-05-03 NOTE — Progress Notes (Signed)
Physical Therapy Treatment Patient Details Name: Patrick Gray MRN: [id number] DOB: 02-Sep-1955 Today's Date: 05/03/2020    History of Present Illness 65 yo male admitted 04/26/20 s/p ACDF C5-7. s/p MRI on 04/28/20 which showed only a residual acoustic neuroma tumor that will be followed with serial scans as an OP per neurosurgery.  PMH including tremor, PAF, HTN, deafness in L ear, L TSA, acoustic neuroma resection in 1999 L.    PT Comments    Pt reports feeling groggy secondary to pain medication and lack of sleep, but is agreeable to OOB mobility. Pt requires frequent safety cues when up and moving to maintain cervical precautions as well as safely using RW. Pt with shuffling gait and remains a high fall risk, PT encouraged pt's wife to mobilize with pt once d/c home for safety. Pt completed steps with close guard for safety this day, demonstrating ability to enter home once d/c. PT reinforced how important cervical precautions are in pt recovery, as pt's wife states pt has been ambulatory in room without collar. PT to continue to follow acutely, recommending HHPT.    Follow Up Recommendations  Home health PT;Supervision for mobility/OOB     Equipment Recommendations  Rolling walker with 5" wheels    Recommendations for Other Services       Precautions / Restrictions Precautions Precautions: Fall;Cervical Precaution Booklet Issued: Yes (comment) Precaution Comments: handout present in room; reminded pt of precautions Required Braces or Orthoses: Cervical Brace Cervical Brace: Soft collar;Other (comment) (when OOB)    Mobility  Bed Mobility Overal bed mobility: Needs Assistance Bed Mobility: Rolling;Sidelying to Sit;Sit to Sidelying Rolling: Supervision Sidelying to sit: Supervision     Sit to sidelying: Supervision General bed mobility comments: supervision for safety, PT cuing for log roll    Transfers Overall transfer level: Needs assistance Equipment used: Rolling walker  (2 wheeled) Transfers: Sit to/from Stand Sit to Stand: Min guard         General transfer comment: min guard for safety, verbal cuing for hand placement when rising/sitting. STS x2 from EOB.  Ambulation/Gait Ambulation/Gait assistance: Min guard Gait Distance (Feet): 230 Feet Assistive device: Rolling walker (2 wheeled) Gait Pattern/deviations: Step-through pattern;Shuffle;Trunk flexed Gait velocity: decr   General Gait Details: min guard for safety, frequent verbal cuing for upright posture, placement in RW, increasing foot clearance, navigating in hallway as pt bumping into walls frequently.   Stairs Stairs: Yes Stairs assistance: Min guard Stair Management: Two rails;Sideways;Step to pattern Number of Stairs: 6 General stair comments: min guard for safety, verbal cuing for avoiding twisting motion, step-to gait as pt attempting step-over-step very unsteadily. PT instucted pt's wife to stand below pt on steps during ascent in case of need for steadying.   Wheelchair Mobility    Modified Rankin (Stroke Patients Only)       Balance Overall balance assessment: Needs assistance Sitting-balance support: Feet supported Sitting balance-Leahy Scale: Good Sitting balance - Comments: able to sit EOB without PT assist   Standing balance support: Bilateral upper extremity supported Standing balance-Leahy Scale: Fair                              Cognition Arousal/Alertness: Awake/alert Behavior During Therapy: Flat affect Overall Cognitive Status: Impaired/Different from baseline Area of Impairment: Memory;Attention;Following commands;Safety/judgement                   Current Attention Level: Sustained Memory: Decreased recall of precautions Following  Commands: Follows one step commands with increased time Safety/Judgement: Decreased awareness of safety;Decreased awareness of deficits Awareness: Intellectual Problem Solving: Requires verbal  cues;Difficulty sequencing;Requires tactile cues General Comments: Pt with very flat affect, periods of eye closing and declining to answer questions asked by PT about cervical precautions and use of brace. Pt states he feels altered by pain medications. Pt requires cuing for safe mobility, and repeated throughout.      Exercises Other Exercises Other Exercises: reviewed cervical precautions, reminded pt to wear soft collar when OOB to maintain precautions, encouraged pt's wife to be up with him when he is mobilizing over the next few days    General Comments General comments (skin integrity, edema, etc.): resting tremor LUE, shuffling gait during session      Pertinent Vitals/Pain Pain Assessment: Faces Faces Pain Scale: Hurts a little bit Pain Location: neck Pain Descriptors / Indicators: Sore;Discomfort Pain Intervention(s): Limited activity within patient's tolerance;Monitored during session;Repositioned    Home Living                      Prior Function            PT Goals (current goals can now be found in the care plan section) Acute Rehab PT Goals Patient Stated Goal: did not state PT Goal Formulation: With patient Time For Goal Achievement: 05/11/20 Potential to Achieve Goals: Good Progress towards PT goals: Progressing toward goals    Frequency    Min 5X/week      PT Plan Current plan remains appropriate    Co-evaluation              AM-PAC PT "6 Clicks" Mobility   Outcome Measure  Help needed turning from your back to your side while in a flat bed without using bedrails?: A Little Help needed moving from lying on your back to sitting on the side of a flat bed without using bedrails?: A Little Help needed moving to and from a bed to a chair (including a wheelchair)?: A Little Help needed standing up from a chair using your arms (e.g., wheelchair or bedside chair)?: A Little Help needed to walk in hospital room?: A Little Help needed  climbing 3-5 steps with a railing? : A Little 6 Click Score: 18    End of Session Equipment Utilized During Treatment: Cervical collar;Gait belt (soft collar) Activity Tolerance: Patient limited by lethargy Patient left: with call bell/phone within reach;in bed;with restraints reapplied;with family/visitor present Nurse Communication: Mobility status;Other (comment) (AMS) PT Visit Diagnosis: Muscle weakness (generalized) (M62.81);Difficulty in walking, not elsewhere classified (R26.2);Other symptoms and signs involving the nervous system (R29.898);Unsteadiness on feet (R26.81);Other abnormalities of gait and mobility (R26.89)     Time: 7564-3329 PT Time Calculation (min) (ACUTE ONLY): 24 min  Charges:  $Gait Training: 23-37 mins                    Stacie Glaze, PT Acute Rehabilitation Services Pager [phone]  Office [phone]    Louis Matte 05/03/2020, 2:40 PM

## 2020-05-03 NOTE — Progress Notes (Signed)
In and Out cath'ed patient with 450cc's of clear, yellow, urine obtained. Patient tolerated procedure well and is now resting in recliner.

## 2020-05-03 NOTE — Progress Notes (Signed)
Bladder scanned patient and only 154cc's of urine seen in the bladder at this time. Will continue monitoring patients' bladder. Urinal is within reach.

## 2020-05-03 NOTE — TOC Transition Note (Signed)
Transition of Care [facility]) - CM/SW Discharge Note   Patient Details  Name: Patrick Gray MRN: [id number] Date of Birth: 17-Jun-1955  Transition of Care [facility]) CM/SW Contact:  Ella Bodo, RN Phone Number: 05/03/2020, 3:46 PM   Clinical Narrative:  Pt medically stable for discharge home today with spouse to assist with care.  [place] to follow up at home for HHPT/OT, and [facility] agency notified of discharge today.  RW has been delivered to room; no other dc needs identified.      Final next level of care: [place] Barriers to Discharge: Barriers Resolved   Patient Goals and CMS Choice Patient states their goals for this hospitalization and ongoing recovery are:: to go home CMS Medicare.gov Compare Post Acute Care list provided to:: Patient Choice offered to / list presented to : [facility]                        Discharge Plan and Services                DME Arranged: Gilford Rile rolling DME Agency: [employer] Date DME Agency Contacted: 05/02/20 Time DME Agency Contacted: 2330 Representative spoke with at DME Agency: [place]: PT,OT [place]: [place] Date [place]: 05/02/20 Time [place]: 1224 Representative spoke with at [place]: [place] ([place]) Interventions     Readmission Risk Interventions Readmission Risk Prevention Plan 05/03/2020  Post Dischage Appt Complete  Medication Screening Complete  Transportation Screening Complete  Some recent data might be hidden   Reinaldo Raddle, RN, BSN  Trauma/Neuro ICU Case Manager [phone]

## 2020-05-03 NOTE — Progress Notes (Signed)
Subjective: Patient reports no acute events overnight. Still having trouble urinating.   Objective: Vital signs in last 24 hours: Temp:  [97.9 F (36.6 C)-98.3 F (36.8 C)] 98.1 F (36.7 C) (03/14 0816) Pulse Rate:  [90-101] 90 (03/14 0816) Resp:  [16-20] 20 (03/13 2320) BP: (99-145)/(80-92) 145/92 (03/14 0816) SpO2:  [95 %-97 %] 95 % (03/14 0816)  Intake/Output from previous day: 03/13 0701 - 03/14 0700 In: 540 [P.O.:540] Out: 1300 [Urine:1300] Intake/Output this shift: Total I/O In: 203 [P.O.:200; I.V.:3] Out: -   Neurologic: Grossly normal  Lab Results: Lab Results  Component Value Date   WBC 6.4 04/30/2020   HGB 15.5 04/30/2020   HCT 43.8 04/30/2020   MCV 89.8 04/30/2020   PLT 196 04/30/2020   Lab Results  Component Value Date   INR 1.19 06/08/2016   BMET Lab Results  Component Value Date   NA 137 04/30/2020   K 3.1 (L) 04/30/2020   CL 100 04/30/2020   CO2 29 04/30/2020   GLUCOSE 120 (H) 04/30/2020   BUN 12 04/30/2020   CREATININE 0.72 04/30/2020   CALCIUM 9.6 04/30/2020    Studies/Results: No results found.  Assessment/Plan: S/p acdf 1 week. Biggest obstacle now is voiding. He was I&O cath'd at 1:00am. Bladder scan at 5:00am showed 154cc. States that he was not urinated sufficiently since. Will bladder scan him again to see where he is at now. Possible d/c home with leg bag and urology follow up?   LOS: 6 days    Patrick Gray 05/03/2020, 10:07 AM

## 2020-05-11 LAB — VITAMIN B1: Vitamin B1 (Thiamine): 126.3 nmol/L (ref 66.5–200.0)

## 2020-05-19 ENCOUNTER — Other Ambulatory Visit: Payer: Self-pay | Admitting: Internal Medicine

## 2020-05-31 ENCOUNTER — Encounter (HOSPITAL_COMMUNITY): Payer: Self-pay | Admitting: Nurse Practitioner

## 2020-05-31 ENCOUNTER — Ambulatory Visit (HOSPITAL_COMMUNITY)
Admit: 2020-05-31 | Discharge: 2020-05-31 | Disposition: A | Discharge status: Home / Self Care | Payer: BC Managed Care – PPO | Source: Ambulatory Visit | Attending: Nurse Practitioner | Admitting: Nurse Practitioner

## 2020-05-31 ENCOUNTER — Other Ambulatory Visit: Payer: Self-pay

## 2020-05-31 VITALS — BP 128/80 | HR 77 | Ht 71.0 in | Wt 203.2 lb

## 2020-05-31 DIAGNOSIS — I480 Paroxysmal atrial fibrillation: Secondary | ICD-10-CM | POA: Diagnosis not present

## 2020-05-31 DIAGNOSIS — R5383 Other fatigue: Secondary | ICD-10-CM | POA: Insufficient documentation

## 2020-05-31 DIAGNOSIS — Z880 Allergy status to penicillin: Secondary | ICD-10-CM | POA: Diagnosis not present

## 2020-05-31 DIAGNOSIS — I10 Essential (primary) hypertension: Secondary | ICD-10-CM | POA: Diagnosis not present

## 2020-05-31 DIAGNOSIS — I4819 Other persistent atrial fibrillation: Secondary | ICD-10-CM

## 2020-05-31 DIAGNOSIS — Z79899 Other long term (current) drug therapy: Secondary | ICD-10-CM | POA: Insufficient documentation

## 2020-05-31 DIAGNOSIS — Z7901 Long term (current) use of anticoagulants: Secondary | ICD-10-CM | POA: Diagnosis not present

## 2020-05-31 DIAGNOSIS — Z885 Allergy status to narcotic agent status: Secondary | ICD-10-CM | POA: Insufficient documentation

## 2020-05-31 DIAGNOSIS — D6869 Other thrombophilia: Secondary | ICD-10-CM

## 2020-05-31 DIAGNOSIS — Z87891 Personal history of nicotine dependence: Secondary | ICD-10-CM | POA: Diagnosis not present

## 2020-05-31 DIAGNOSIS — Z8249 Family history of ischemic heart disease and other diseases of the circulatory system: Secondary | ICD-10-CM | POA: Diagnosis not present

## 2020-05-31 NOTE — Progress Notes (Signed)
Patient ID: Patrick Gray, male   DOB: 15-Sep-1955, 65 y.o.   MRN: [id number]     Primary Care Physician: Patrick Contras, MD Referring Physician: Telesforo Gray is a 65 y.o. male with a h/o afib 2015 found by primary. Was placed on flecainide 100 mg bid and has done well until seen 6/20 and was noted to be in aflutter with RVR.Because of breakthrough afib, he was scheduled for ablation 11/30/15.  F/u in afib clinic 3 13. He was pending shoulder surgery   and has been in afib x 2 weeks. Flecainide was decreased to 50 mg bid, as he was told he could try to wean off after cardioversion. and within several days he returned to afib.   After shoulder surgery, he did have afib for a couple of weeks and did return to [name] on his own.  He is in the afib clinic 11/15/16. He has returned to afib since the beginning of September. He had been trying to wean of flecainide  again. He has has some tremors and when he tries to wean off drug, the tremors get better. He is toying with the idea of having  another ablation vrs a cardioversion but has guests this week into next week and will wait until then to make the decision. He is tolerating well and is rate controlled. No missed doses of eliquis. He wishes to pursue another ablation.  F/u in fib clinic after 2nd ablation 12/19/16. He felt well until yesterday PM when he noted some wheezing, shortness of breath and could not lie flat last night to sleep. HR has been regular. No fever, chills, chest pain, cough. His weight is noted to be up 10 lbs from my last clinic visit around one month ago. He feels bloated in his abdomen.  F/u in afib clinic 12/26/16. He was started on lasix on last visit for fluid overload following ablation. He felt well after the first dose of lasix and by Saturday night( 2 doses), he was able to lie flat to sleep. He did not take lasix this am weight is down 8 lbs. Maintaining SR.  Pt is here for one month f/u ablation. He has not noted  any further afib. Weight is stable.Compliant with eliquis without any missed doses.  Pt being seen in clinic after calling to office, being out of rhythm x 2 weeks. He feels the trigger was waiting almost 2 weeks for test results for his wife, who has a h/o cancer. She got better news  than was expected but she is still pending surgery in the near future. Pt had stopped flecainide for worsening tremor of left arm, improved off flecainide. He took two doses of flecainide when he got out of rhythm and tremor worsened, so he says that he will no longer try flecainide. He states that he has never had tremor evaluated by neurologist, has mentioned to PCP.  F/u in afib clinic, 4/16. S/p successful cardioversion. He feels well. EKG shows SR.   F/u in afib clinic 9/16. He called to the clinic last week and asked to  increase his metoprolol as his HR's were elevated. His EKG shows atrial flutter, rate controlled. He has had the stress of his wife recently undergoing breast CA surgery x 3, his mother recently died and his adult Autistic daughter has been having some issues.  F/u in clinic 12/03/17, he had successful cardioversion and is still maintaining SR.   F/u in afib clinic, 05/31/20.  I have not seen pt for several years. He was seen by Dr. Rayann Gray in early March for pre op clearance for neck injury after a fall. He was found to be in afib at that time. He was given this appointment to see about scheduling DCCV 4 weeks after surgery if he remained out of rhythm. 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   He wife reports that he had a very difficult post op course complicated by post op delirium with aggressive behavior requiring restraints  and neurogenic bladder from  which he is still wearing a catheter. He is very weak and is supposed to  start physical therapy soon. He is rate controlled and does not seem to be bothered  with his rhythm at this time. He does not wish to have any other procedures until he is back to his baseline from surgery.   Today, he denies symptoms of palpitations, chest pain, shortness of breath, orthopnea, PND, lower extremity edema, dizziness, presyncope, syncope, or neurologic sequela. Positive for fatigue in afib , hand tremor. The patient is tolerating medications without difficulties and is otherwise without complaint today.   Past Medical History:  Diagnosis Date  . Anxiety   . Arthritis   . Deafness in left ear   . Dysrhythmia    PAF  . H/O cardiac radiofrequency ablation   . HTN (hypertension)   . Hypercholesterolemia   . Hypopotassemia   . Paroxysmal atrial fibrillation (HCC)   . Tremor    left hand   Past Surgical History:  Procedure Laterality Date  . ACOUSTIC NEUROMA RESECTION Left 19990  . ANTERIOR CERVICAL DECOMP/DISCECTOMY FUSION N/A 04/26/2020   Procedure: Anterior Cervical Decompression Fusion - Cervical five-Cervical six - Cervical six-Cervical seven;  Surgeon: Patrick Kos, MD;  Location: [facility];  Service: Neurosurgery;  Laterality: N/A;  . APPENDECTOMY  1976  . ATRIAL FIBRILLATION ABLATION N/A 12/19/2016   Procedure: ATRIAL FIBRILLATION ABLATION;  Surgeon: Patrick Grayer, MD;  Location: [facility];  Service: Cardiovascular;  Laterality: N/A;  . CARDIAC CATHETERIZATION N/A 10/07/2015   Procedure: Left Heart Cath and Coronary Angiography;  Surgeon: Patrick Hector, MD;  Location: [facility];  Service: Cardiovascular;  Laterality: N/A;  . CARDIOVERSION N/A 06/17/2013   Procedure: CARDIOVERSION;  Surgeon: Patrick Hector, MD;  Location: [facility];  Service: Cardiovascular;  Laterality: N/A;  . CARDIOVERSION N/A 05/11/2016   Procedure: CARDIOVERSION;  Surgeon: Patrick Klein, MD;  Location: [facility];  Service: Cardiovascular;  Laterality: N/A;  . CARDIOVERSION N/A 05/24/2017   Procedure: CARDIOVERSION;  Surgeon: Patrick Margarita, MD;  Location: [facility]  ENDOSCOPY;  Service: Cardiovascular;  Laterality: N/A;  . CARDIOVERSION N/A 11/19/2017   Procedure: CARDIOVERSION;  Surgeon: Patrick Casino, MD;  Location: [facility] ENDOSCOPY;  Service: Cardiovascular;  Laterality: N/A;  . CHOLECYSTECTOMY  1976  . CLUB FOOT RELEASE  1968  . ELECTROPHYSIOLOGIC STUDY N/A 11/30/2015   Procedure: Atrial Fibrillation Ablation;  Surgeon: Patrick Grayer, MD;  Location: [facility];  Service: Cardiovascular;  Laterality: N/A;  . TEE WITHOUT CARDIOVERSION N/A 05/11/2016   Procedure: TRANSESOPHAGEAL ECHOCARDIOGRAM (TEE);  Surgeon: Patrick Klein, MD;  Location: [facility];  Service: Cardiovascular;  Laterality: N/A;  . TONSILLECTOMY  1967  . TOTAL SHOULDER ARTHROPLASTY Left 06/15/2016   Procedure: TOTAL SHOULDER ARTHROPLASTY;  Surgeon: Tania Ade, MD;  Location: [place];  Service: Orthopedics;  Laterality: Left;  Left total shoulder arthroplasty  Current Outpatient Medications  Medication Sig Dispense Refill  . amLODipine (NORVASC) 5 MG tablet Take 5 mg by mouth daily.    Marland Kitchen atorvastatin (LIPITOR) 20 MG tablet Take 20 mg by mouth every evening.     . Cholecalciferol (VITAMIN D-3) 5000 units TABS Take 5,000 Units by mouth daily.    . Coenzyme Q10 (COQ10) 100 MG CAPS Take 100 mg by mouth daily.    Marland Kitchen lisinopril-hydrochlorothiazide (PRINZIDE,ZESTORETIC) 20-25 MG per tablet Take 1 tablet by mouth daily.     . metoprolol tartrate (LOPRESSOR) 50 MG tablet TAKE 1 TABLET BY MOUTH TWICE DAILY. MAY TAKE AN EXTRA 1/2 TABLET AS NEEDED FOR BREAKTHROUGH AFIB 200 tablet 3  . Multiple Vitamins-Minerals (ZINC PO) Take 1 tablet by mouth daily.    . potassium chloride SA (K-DUR) 20 MEQ tablet Take 1 tablet (20 meq) by mouth in the morning & take 2 tablets (40 meq) by mouth in the evening. 90 tablet 6  . QUERCETIN PO Take 1 capsule by mouth daily.    . sildenafil (REVATIO) 20 MG tablet Take 20-100 mg by mouth daily as needed for erectile dysfunction.  0  . tamsulosin (FLOMAX) 0.4 MG  CAPS capsule Take 0.4 mg by mouth daily after supper.    . traMADol (ULTRAM) 50 MG tablet Take by mouth every 6 (six) hours as needed.    . TRAZODONE HCL PO Take 50 mg by mouth at bedtime.    Marland Kitchen ibuprofen (ADVIL,MOTRIN) 200 MG tablet Take 400-600 mg by mouth daily as needed (for pain).     No current facility-administered medications for this encounter.    Allergies  Allergen Reactions  . Oxycodone Other (See Comments)    hallucinations  . Penicillins Rash and Other (See Comments)    Has patient had a PCN reaction causing immediate rash, facial/tongue/throat swelling, SOB or lightheadedness with hypotension: No Has patient had a PCN reaction causing severe rash involving mucus membranes or skin necrosis: No Has patient had a PCN reaction that required hospitalization No Has patient had a PCN reaction occurring within the last 10 years: No If all of the above answers are "NO", then may proceed with Cephalosporin use.     Social History   Socioeconomic History  . Marital status: Married    Spouse name: Not on file  . Number of children: Not on file  . Years of education: Not on file  . Highest education level: Not on file  Occupational History  . Not on file  Tobacco Use  . Smoking status: Former Research scientist (life sciences)  . Smokeless tobacco: Never Used  . Tobacco comment: smoke in college- social  Vaping Use  . Vaping Use: Never used  Substance and Sexual Activity  . Alcohol use: Yes    Alcohol/week: 14.0 standard drinks    Types: 14 Cans of beer per week  . Drug use: No  . Sexual activity: Not on file  Other Topics Concern  . Not on file  Social History Narrative   Lives in [place] wife.  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   Social Determinants of Health   Financial Resource Strain: Not on file  Food Insecurity: Not on file  Transportation Needs: Not on file  Physical Activity: Not on file  Stress: Not on file  Social Connections: Not on file  Intimate Partner Violence:  Not on file    Family History  Problem Relation Age of Onset  . Hypertension Father   . Hypertension Mother   . Stroke  Mother   . CVA Mother   . Hypertension Brother   . Hypercholesterolemia Brother   . Atrial fibrillation Brother   . Congestive Heart Failure Brother     ROS- All systems are reviewed and negative except as per the HPI above  Physical Exam: Vitals:   05/31/20 1107  BP: 128/80  Pulse: 77  Weight: 92.2 kg  Height: 5\' 11"  (1.803 m)    GEN- The patient is well appearing, alert and oriented x 3 today.   Head- normocephalic, atraumatic Eyes-  Sclera clear, conjunctiva pink Ears- hearing intact Oropharynx- clear Neck- supple, no JVP Lymph- no cervical lymphadenopathy Lungs- Clear to ausculation bilaterally, normal work of breathing, few rales hears in lung bases Heart-irregular rate and rhythm, no murmurs, rubs or gallops, PMI not laterally displaced GI- soft, NT, ND, + BS Extremities- no clubbing, cyanosis, or edema MS- no significant deformity or atrophy Skin- no rash or lesion Psych- euthymic mood, full affect Neuro- strength and sensation are intact  EKG-  Atrial  flutter at 77 bpm, qrs int 76 ms, qtc 439 ms  Epic records reviewed   Assessment and Plan: 1.  Paroxymal atrial flutter, s/p afib ablation 11/30/15 and 12/19/16 Successful cardioversion 11/19/17 Recently found to be in afib prior to surgery  Dr. Rayann Gray scheduled this appointment 4 weeks after his surgery to discuss cardioversion He is unaware of his rhythm currently, rate controlled  Had a difficult post op course and does not want any other procedures done until he can reagin his strength from  his recent surgery  Continue eliquis for chadsvasc score of 1, he just started back the drug several days after d/c and has not been back on it yet long enough to  even consider CV Continue metoprolol 50 mg bid  2. HTN Stable  The pt/wife requested to put the CV on back burner until he can  recoup from  his surgery  I will bring back in 2 months    Butch Penny C. Stepan Verrette, [facility] [address] [place], [place] [address] [phone]

## 2020-06-24 ENCOUNTER — Telehealth: Payer: Self-pay | Admitting: Internal Medicine

## 2020-06-24 ENCOUNTER — Other Ambulatory Visit: Payer: Self-pay | Admitting: Urology

## 2020-06-24 NOTE — Telephone Encounter (Signed)
Pharmacy, can you please comment on how long Eliquis can be held for upcoming procedure?  Of note, I actually don't see Eliquis listed on his home medications. However, patient was seen by Atrial Fibrillation Clinic on 05/31/2020 and per that note should be on Eliquis. He had reportedly recently started back on it after recent cervical spine surgery.  Thank you!

## 2020-06-24 NOTE — Telephone Encounter (Signed)
Patient with diagnosis of afib on Eliquis for anticoagulation.    Procedure: TURP Date of procedure: 07/02/20  CHA2DS2-VASc Score = 1  This indicates a 0.6% annual risk of stroke. The patient's score is based upon: CHF History: No HTN History: Yes Diabetes History: No Stroke History: No Vascular Disease History: No Age Score: 0 Gender Score: 0   CrCl 110 ml/min Platelet count 196  Per office protocol, patient can hold Eliquis for 2 days prior to procedure.   Please confirm patient is taking Eliquis at 5mg  BID and please add back to med list if confirmed.

## 2020-06-24 NOTE — Telephone Encounter (Signed)
        [facility] Pre-operative Risk Assessment    Patient Name: YISHAI REHFELD  DOB: [birth year]  MRN: [id number]   HEARTCARE STAFF: - Please ensure there is not already an duplicate clearance open for this procedure. - Under Visit Info/Reason for Call, type in Other and utilize the format Clearance MM/DD/YY or Clearance TBD. Do not use dashes or single digits. - If request is for dental extraction, please clarify the # of teeth to be extracted.  Request for surgical clearance:  1. What type of surgery is being performed? TURP  2. When is this surgery scheduled? 07/02/20  3. What type of clearance is required (medical clearance vs. Pharmacy clearance to hold med vs. Both)? Both  4. Are there any medications that need to be held prior to surgery and how long? Eliquis, 2 days prior surgery  5. Practice name and name of physician performing surgery? Dr. Gloriann Loan - [facility]  6. What is the office phone number? [place]   7.   What is the office fax number? [phone]  8.   Anesthesia type (None, local, MAC, general) ? General   Angeline S Hammer 06/24/2020, 9:19 AM  _________________________________________________________________   (provider comments below)

## 2020-06-24 NOTE — Telephone Encounter (Signed)
   Name: Patrick Gray  DOB: 1955/03/14  MRN: [id number]   Primary Cardiologist: Thompson Grayer, MD  Chart reviewed as part of pre-operative protocol coverage. Patient was contacted 06/24/2020 in reference to pre-operative risk assessment for pending surgery as outlined below.  Patrick Gray was last seen on 05/31/20 by Roderic Palau, NP in the atrial fibrillation clinic.  Since that day, Patrick Gray has done well from a cardiac standpoint. He can easily complete 4 METs without anginal complaints.  Therefore, based on ACC/AHA guidelines, the patient would be at acceptable risk for the planned procedure without further cardiovascular testing.   The patient was advised that if he develops new symptoms prior to surgery to contact our office to arrange for a follow-up visit, and he verbalized understanding.  Confirmed patient has been taking his eliquis 5mg  BID for stroke prophylaxis. Per pharmacy recommendations, patient can hold eliquis 2 days prior to his upcoming TURP with plans to restart as soon as she is cleared to do so by his urologist.   I will route this recommendation to the requesting party via [facility] fax function and remove from pre-op pool. Please call with questions.  Abigail Butts, PA-C 06/24/2020, 3:20 PM

## 2020-06-28 NOTE — Progress Notes (Addendum)
COVID Vaccine Completed: No Date COVID Vaccine completed:N/A Has received booster:N/A COVID vaccine manufacturer: N/A  Date of COVID positive in last 90 days: No  PCP - [name], MD Cardiologist - Thompson Grayer, MD cardiac clearance from [facility] 06/24/20 in epic  Chest x-ray - N/A EKG - 05/31/20 in epic Stress Test - greater than 2 years ECHO - greater than 2 years Cardiac Cath - greater than 2 years Pacemaker/ICD device last checked: N/A  Sleep Study - N/A CPAP - N/A  Fasting Blood Sugar - N/A Checks Blood Sugar __N/A___ times a day  Blood Thinner Instructions:Eliquis 5mg  BID, patient can hold eliquis 2 days prior to his upcoming TURP with plans to restart as soon as she is cleared to do so by his urologist, patient last dose 06/28/2020.  Aspirin Instructions: N/A Last Dose: N/A  Activity level: Able to exercise without symptoms almost a mile daily     Anesthesia review:  PAFib/flutter, HTN  Patient denies shortness of breath, fever, cough and chest pain at PAT appointment   Patient verbalized understanding of instructions that were given to them at the PAT appointment. Patient was also instructed that they will need to review over the PAT instructions again at home before surgery.

## 2020-06-29 ENCOUNTER — Encounter (HOSPITAL_COMMUNITY): Payer: Self-pay | Admitting: Urology

## 2020-06-29 ENCOUNTER — Other Ambulatory Visit: Payer: Self-pay

## 2020-06-29 NOTE — Anesthesia Preprocedure Evaluation (Addendum)
Anesthesia Evaluation  Patient identified by MRN, date of birth, ID band Patient awake    Reviewed: Allergy & Precautions, NPO status , Patient's Chart, lab work & pertinent test results  History of Anesthesia Complications (+) history of anesthetic complications (hallucinations-cervical fusion in March)  Airway Mallampati: II  TM Distance: >3 FB Neck ROM: Full    Dental no notable dental hx. (+) Teeth Intact, Dental Advisory Given   Pulmonary neg pulmonary ROS, former smoker,    Pulmonary exam normal breath sounds clear to auscultation       Cardiovascular hypertension, Normal cardiovascular exam+ dysrhythmias Atrial Fibrillation  Rhythm:Regular Rate:Normal  Echo 2018  EF 55-60% Aflutter   Neuro/Psych Anxiety    GI/Hepatic Neg liver ROS,   Endo/Other  negative endocrine ROS  Renal/GU negative Renal ROS     Musculoskeletal  (+) Arthritis ,   Abdominal   Peds  Hematology Lab Results      Component                Value               Date                      WBC                      6.4                 04/30/2020                HGB                      15.5                04/30/2020                HCT                      43.8                04/30/2020                MCV                      89.8                04/30/2020                PLT                      196                 04/30/2020              Anesthesia Other Findings   Reproductive/Obstetrics                           Anesthesia Physical Anesthesia Plan  ASA: III  Anesthesia Plan: General   Post-op Pain Management:    Induction: Intravenous  PONV Risk Score and Plan: 3 and Treatment may vary due to age or medical condition, Midazolam, Dexamethasone and Ondansetron  Airway Management Planned: LMA  Additional Equipment: None  Intra-op Plan:   Post-operative Plan:   Informed Consent: I have reviewed the patients  History and Physical, chart, labs and discussed the procedure including the risks, benefits and alternatives  for the proposed anesthesia with the patient or authorized representative who has indicated his/her understanding and acceptance.     Dental advisory given  Plan Discussed with:   Anesthesia Plan Comments: (See PAT note 06/29/2020, Konrad Felix, PA-C)      Anesthesia Quick Evaluation

## 2020-06-29 NOTE — Progress Notes (Signed)
Anesthesia Chart Review   Case: [id number] Date/Time: 07/02/20 1345   Procedure: TRANSURETHRAL RESECTION OF THE PROSTATE (TURP) (N/A )   Anesthesia type: General   Pre-op diagnosis: BENIGN PROSTATE HYPERPLASIA   Location: [facility]   Surgeons: Lucas Mallow, MD      DISCUSSION:65 y.o. former smoker with h/o HTN, PAF (on Eliquis), BPH scheduled for above procedure 07/02/2020 with Dr. Link Snuffer.   Per cardiology preoperative evaluation 06/24/2020, "Chart reviewed as part of pre-operative protocol coverage. Patient was contacted 06/24/2020 in reference to pre-operative risk assessment for pending surgery as outlined below.  Patrick Gray was last seen on 05/31/20 by Roderic Palau, NP in the atrial fibrillation clinic.  Since that day, Patrick Gray has done well from a cardiac standpoint. He can easily complete 4 METs without anginal complaints.  Therefore, based on ACC/AHA guidelines, the patient would be at acceptable risk for the planned procedure without further cardiovascular testing.   The patient was advised that if he develops new symptoms prior to surgery to contact our office to arrange for a follow-up visit, and he verbalized understanding.  Confirmed patient has been taking his eliquis 5mg  BID for stroke prophylaxis. Per pharmacy recommendations, patient can hold eliquis 2 days prior to his upcoming TURP with plans to restart as soon as she is cleared to do so by his urologist."  Anticipate pt can proceed with planned procedure barring acute status change.   VS: Ht 5\' 11"  (1.803 m)   Wt 92.1 kg   BMI 28.31 kg/m   PROVIDERS: [name], MD is PCP   Thompson Grayer, MD is Cardiologist  LABS: Labs DOS, SDW (all labs ordered are listed, but only abnormal results are displayed)  Labs Reviewed - No data to display   IMAGES:   EKG: 05/31/20 Rate 77 bpm  Atrial flutter with variable A-V block   CV: Echo 01/22/2017 Study Conclusions   - Left  ventricle: The cavity size was mildly dilated. Wall  thickness was increased in a pattern of mild LVH. Systolic  function was normal. The estimated ejection fraction was in the  range of 55% to 60%.  - Mitral valve: There was mild regurgitation.  - Left atrium: The atrium was mildly dilated.   Stress Test 09/17/2015   The left ventricular ejection fraction is normal (55-65%).  Nuclear stress EF: 58%. No wall motion abnormalities  There was no ST segment deviation noted during stress.  Defect 1: There is a small defect of moderate severity present in the mid inferior location.  Findings consistent with ischemia.  This is a low risk study.  Cardiac Cath 10/07/2015   Prox RCA lesion, 30 %stenosed.  RPDA lesion, 30 %stenosed.   Normal right dominant coronary arteries with false positive myovue EF 65% Only 30% proximal/mid and PLA lesions seen  Past Medical History:  Diagnosis Date  . Acoustic neuroma ([place])    left  . Anxiety   . Arthritis   . Complication of anesthesia    Delusion, violent, hallucination, hospitalized 8 days, urine retention  . Deafness in left ear   . Delusion ([place])    after fall on pain meds and prednisone  . Dysrhythmia    PAF  . Foley catheter in place   . H/O cardiac radiofrequency ablation   . HTN (hypertension)   . Hypercholesterolemia   . Hypopotassemia   . Insomnia   . Paroxysmal atrial fibrillation (HCC)   . Radiculopathy  after fall  . Tremor    left hand  . Vitamin B 12 deficiency     Past Surgical History:  Procedure Laterality Date  . ACOUSTIC NEUROMA RESECTION Left 19990  . ANTERIOR CERVICAL DECOMP/DISCECTOMY FUSION N/A 04/26/2020   Procedure: Anterior Cervical Decompression Fusion - Cervical five-Cervical six - Cervical six-Cervical seven;  Surgeon: Kary Kos, MD;  Location: [facility];  Service: Neurosurgery;  Laterality: N/A;  . APPENDECTOMY  1976  . ATRIAL FIBRILLATION ABLATION N/A 12/19/2016   Procedure: ATRIAL  FIBRILLATION ABLATION;  Surgeon: Thompson Grayer, MD;  Location: [facility];  Service: Cardiovascular;  Laterality: N/A;  . CARDIAC CATHETERIZATION N/A 10/07/2015   Procedure: Left Heart Cath and Coronary Angiography;  Surgeon: Josue Hector, MD;  Location: [facility];  Service: Cardiovascular;  Laterality: N/A;  . CARDIOVERSION N/A 06/17/2013   Procedure: CARDIOVERSION;  Surgeon: Josue Hector, MD;  Location: [facility];  Service: Cardiovascular;  Laterality: N/A;  . CARDIOVERSION N/A 05/11/2016   Procedure: CARDIOVERSION;  Surgeon: Sanda Klein, MD;  Location: [facility];  Service: Cardiovascular;  Laterality: N/A;  . CARDIOVERSION N/A 05/24/2017   Procedure: CARDIOVERSION;  Surgeon: Sueanne Margarita, MD;  Location: [facility] ENDOS[facility];  Service: Cardiovascular;  Laterality: N/A;  . CARDIOVERSION N/A 11/19/2017   Procedure: CARDIOVERSION;  Surgeon: Pixie Casino, MD;  Location: [facility] ENDOSCOPY;  Service: Cardiovascular;  Laterality: N/A;  . CHOLECYSTECTOMY  1976  . CLUB FOOT RELEASE  1968  . ELECTROPHYSIOLOGIC STUDY N/A 11/30/2015   Procedure: Atrial Fibrillation Ablation;  Surgeon: Thompson Grayer, MD;  Location: [facility];  Service: Cardiovascular;  Laterality: N/A;  . TEE WITHOUT CARDIOVERSION N/A 05/11/2016   Procedure: TRANSESOPHAGEAL ECHOCARDIOGRAM (TEE);  Surgeon: Sanda Klein, MD;  Location: [facility];  Service: Cardiovascular;  Laterality: N/A;  . TONSILLECTOMY  1967  . TOTAL SHOULDER ARTHROPLASTY Left 06/15/2016   Procedure: TOTAL SHOULDER ARTHROPLASTY;  Surgeon: Tania Ade, MD;  Location: [facility];  Service: Orthopedics;  Laterality: Left;  Left total shoulder arthroplasty    MEDICATIONS: No current facility-administered medications for this encounter.   Marland Kitchen amLODipine (NORVASC) 5 MG tablet  . apixaban (ELIQUIS) 5 MG TABS tablet  . atorvastatin (LIPITOR) 20 MG tablet  . Cholecalciferol (VITAMIN D-3) 5000 units TABS  . Coenzyme Q10 (COQ10) 100 MG CAPS  .  lisinopril-hydrochlorothiazide (PRINZIDE,ZESTORETIC) 20-25 MG per tablet  . metoprolol tartrate (LOPRESSOR) 50 MG tablet  . potassium chloride SA (K-DUR) 20 MEQ tablet  . QUERCETIN PO  . sildenafil (REVATIO) 20 MG tablet  . tamsulosin (FLOMAX) 0.4 MG CAPS capsule  . traMADol (ULTRAM) 50 MG tablet  . traZODone (DESYREL) 50 MG tablet  . zinc gluconate 50 MG tablet     Konrad Felix, PA-C [facility] Pre-Surgical Testing [phone]

## 2020-06-30 ENCOUNTER — Other Ambulatory Visit (HOSPITAL_COMMUNITY)
Admission: RE | Admit: 2020-06-30 | Discharge: 2020-06-30 | Disposition: A | Discharge status: Home / Self Care | Payer: BC Managed Care – PPO | Source: Ambulatory Visit | Attending: Urology | Admitting: Urology

## 2020-06-30 DIAGNOSIS — Z01812 Encounter for preprocedural laboratory examination: Secondary | ICD-10-CM | POA: Diagnosis present

## 2020-06-30 DIAGNOSIS — Z20822 Contact with and (suspected) exposure to covid-19: Secondary | ICD-10-CM | POA: Insufficient documentation

## 2020-06-30 LAB — SARS CORONAVIRUS 2 (TAT 6-24 HRS): SARS Coronavirus 2: NEGATIVE

## 2020-07-02 ENCOUNTER — Ambulatory Visit (HOSPITAL_COMMUNITY): Payer: BC Managed Care – PPO | Admitting: Physician Assistant

## 2020-07-02 ENCOUNTER — Observation Stay (HOSPITAL_COMMUNITY)
Admission: RE | Admit: 2020-07-02 | Discharge: 2020-07-03 | Disposition: A | Discharge status: Home / Self Care | Payer: BC Managed Care – PPO | Attending: Urology | Admitting: Urology

## 2020-07-02 ENCOUNTER — Encounter (HOSPITAL_COMMUNITY): Payer: Self-pay | Admitting: Urology

## 2020-07-02 ENCOUNTER — Other Ambulatory Visit: Payer: Self-pay

## 2020-07-02 ENCOUNTER — Encounter (HOSPITAL_COMMUNITY)
Admission: RE | Disposition: A | Discharge status: Home / Self Care | Payer: Self-pay | Source: Home / Self Care | Attending: Urology

## 2020-07-02 DIAGNOSIS — I4891 Unspecified atrial fibrillation: Secondary | ICD-10-CM | POA: Insufficient documentation

## 2020-07-02 DIAGNOSIS — N401 Enlarged prostate with lower urinary tract symptoms: Secondary | ICD-10-CM | POA: Diagnosis present

## 2020-07-02 DIAGNOSIS — Z79899 Other long term (current) drug therapy: Secondary | ICD-10-CM | POA: Diagnosis not present

## 2020-07-02 DIAGNOSIS — N400 Enlarged prostate without lower urinary tract symptoms: Secondary | ICD-10-CM | POA: Diagnosis present

## 2020-07-02 DIAGNOSIS — Z28310 Unvaccinated for covid-19: Secondary | ICD-10-CM | POA: Diagnosis not present

## 2020-07-02 DIAGNOSIS — Z87891 Personal history of nicotine dependence: Secondary | ICD-10-CM | POA: Diagnosis not present

## 2020-07-02 DIAGNOSIS — C61 Malignant neoplasm of prostate: Principal | ICD-10-CM | POA: Insufficient documentation

## 2020-07-02 DIAGNOSIS — Z8616 Personal history of COVID-19: Secondary | ICD-10-CM | POA: Insufficient documentation

## 2020-07-02 DIAGNOSIS — I10 Essential (primary) hypertension: Secondary | ICD-10-CM | POA: Insufficient documentation

## 2020-07-02 DIAGNOSIS — N138 Other obstructive and reflux uropathy: Secondary | ICD-10-CM | POA: Insufficient documentation

## 2020-07-02 HISTORY — DX: Radiculopathy, site unspecified: M54.10

## 2020-07-02 HISTORY — DX: Deficiency of other specified B group vitamins: E53.8

## 2020-07-02 HISTORY — DX: Presence of other specified devices: Z97.8

## 2020-07-02 HISTORY — DX: Other complications of anesthesia, initial encounter: T88.59XA

## 2020-07-02 HISTORY — DX: Insomnia, unspecified: G47.00

## 2020-07-02 HISTORY — DX: Benign neoplasm of cranial nerves: D33.3

## 2020-07-02 HISTORY — DX: Delusional disorders: F22

## 2020-07-02 HISTORY — PX: TRANSURETHRAL RESECTION OF PROSTATE: SHX73

## 2020-07-02 LAB — TYPE AND SCREEN
ABO/RH(D): O NEG
Antibody Screen: NEGATIVE

## 2020-07-02 LAB — CBC
HCT: 41.4 % (ref 39.0–52.0)
Hemoglobin: 14.7 g/dL (ref 13.0–17.0)
MCH: 30.8 pg (ref 26.0–34.0)
MCHC: 35.5 g/dL (ref 30.0–36.0)
MCV: 86.6 fL (ref 80.0–100.0)
Platelets: 150 10*3/uL (ref 150–400)
RBC: 4.78 MIL/uL (ref 4.22–5.81)
RDW: 13.3 % (ref 11.5–15.5)
WBC: 4.5 10*3/uL (ref 4.0–10.5)
nRBC: 0 % (ref 0.0–0.2)

## 2020-07-02 LAB — BASIC METABOLIC PANEL
Anion gap: 7 (ref 5–15)
BUN: 18 mg/dL (ref 8–23)
CO2: 27 mmol/L (ref 22–32)
Calcium: 9.8 mg/dL (ref 8.9–10.3)
Chloride: 106 mmol/L (ref 98–111)
Creatinine, Ser: 0.65 mg/dL (ref 0.61–1.24)
GFR, Estimated: >60 mL/min (ref >60)
Glucose, Bld: 117 mg/dL — ABNORMAL HIGH (ref 70–99)
Potassium: 3.5 mmol/L (ref 3.5–5.1)
Sodium: 140 mmol/L (ref 135–145)

## 2020-07-02 LAB — ABO/RH: ABO/RH(D): O NEG

## 2020-07-02 SURGERY — TURP (TRANSURETHRAL RESECTION OF PROSTATE)
Anesthesia: General | Site: Bladder

## 2020-07-02 MED ORDER — LACTATED RINGERS IV SOLN: INTRAVENOUS | Status: DC

## 2020-07-02 MED ORDER — SODIUM CHLORIDE 0.9 % IV SOLN: INTRAVENOUS | Status: DC

## 2020-07-02 MED ORDER — DEXAMETHASONE SODIUM PHOSPHATE 10 MG/ML IJ SOLN
INTRAMUSCULAR | Status: AC
  Filled 2020-07-02: qty 1

## 2020-07-02 MED ORDER — ATORVASTATIN CALCIUM 20 MG PO TABS
20.0000 mg | ORAL_TABLET | Freq: Every day | ORAL | Status: DC
  Administered 2020-07-03: 20 mg via ORAL
  Filled 2020-07-02 (×2): qty 1

## 2020-07-02 MED ORDER — ACETAMINOPHEN 10 MG/ML IV SOLN: 1000.0000 mg | Freq: Once | INTRAVENOUS | Status: DC | PRN

## 2020-07-02 MED ORDER — ZINC GLUCONATE 50 MG PO TABS: 50.0000 mg | ORAL_TABLET | Freq: Every day | ORAL | Status: DC

## 2020-07-02 MED ORDER — METOPROLOL TARTRATE 50 MG PO TABS
50.0000 mg | ORAL_TABLET | Freq: Two times a day (BID) | ORAL | Status: DC
  Administered 2020-07-02 – 2020-07-03 (×2): 50 mg via ORAL
  Filled 2020-07-02 (×2): qty 1

## 2020-07-02 MED ORDER — LIDOCAINE 2% (20 MG/ML) 5 ML SYRINGE
INTRAMUSCULAR | Status: DC | PRN
  Administered 2020-07-02: 100 mg via INTRAVENOUS

## 2020-07-02 MED ORDER — ACETAMINOPHEN 325 MG PO TABS
650.0000 mg | ORAL_TABLET | ORAL | Status: DC | PRN
  Administered 2020-07-02: 650 mg via ORAL
  Filled 2020-07-02: qty 2

## 2020-07-02 MED ORDER — VITAMIN D3 25 MCG (1000 UNIT) PO TABS: 5000.0000 [IU] | ORAL_TABLET | Freq: Every day | ORAL | Status: DC

## 2020-07-02 MED ORDER — ONDANSETRON HCL 4 MG/2ML IJ SOLN
INTRAMUSCULAR | Status: DC | PRN
  Administered 2020-07-02: 4 mg via INTRAVENOUS

## 2020-07-02 MED ORDER — SENNOSIDES-DOCUSATE SODIUM 8.6-50 MG PO TABS
2.0000 | ORAL_TABLET | Freq: Every day | ORAL | Status: DC
  Administered 2020-07-02: 2 via ORAL
  Filled 2020-07-02: qty 2

## 2020-07-02 MED ORDER — MORPHINE SULFATE (PF) 2 MG/ML IV SOLN: 2.0000 mg | INTRAVENOUS | Status: DC | PRN

## 2020-07-02 MED ORDER — LISINOPRIL-HYDROCHLOROTHIAZIDE 20-25 MG PO TABS: 1.0000 | ORAL_TABLET | Freq: Every day | ORAL | Status: DC

## 2020-07-02 MED ORDER — BACITRACIN-NEOMYCIN-POLYMYXIN 400-5-5000 EX OINT: 1.0000 "application " | TOPICAL_OINTMENT | Freq: Three times a day (TID) | CUTANEOUS | Status: DC | PRN

## 2020-07-02 MED ORDER — ZOLPIDEM TARTRATE 5 MG PO TABS: 5.0000 mg | ORAL_TABLET | Freq: Every evening | ORAL | Status: DC | PRN

## 2020-07-02 MED ORDER — OXYCODONE HCL 5 MG/5ML PO SOLN: 5.0000 mg | Freq: Once | ORAL | Status: DC | PRN

## 2020-07-02 MED ORDER — LISINOPRIL 20 MG PO TABS
20.0000 mg | ORAL_TABLET | Freq: Every day | ORAL | Status: DC
  Administered 2020-07-02 – 2020-07-03 (×2): 20 mg via ORAL
  Filled 2020-07-02 (×2): qty 1

## 2020-07-02 MED ORDER — PHENYLEPHRINE 40 MCG/ML (10ML) SYRINGE FOR IV PUSH (FOR BLOOD PRESSURE SUPPORT)
PREFILLED_SYRINGE | INTRAVENOUS | Status: AC
  Filled 2020-07-02: qty 10

## 2020-07-02 MED ORDER — BELLADONNA ALKALOIDS-OPIUM 16.2-60 MG RE SUPP
1.0000 | Freq: Four times a day (QID) | RECTAL | Status: DC | PRN
  Administered 2020-07-02: 1 via RECTAL

## 2020-07-02 MED ORDER — HYDROCODONE-ACETAMINOPHEN 5-325 MG PO TABS: 1.0000 | ORAL_TABLET | ORAL | Status: DC | PRN

## 2020-07-02 MED ORDER — DIPHENHYDRAMINE HCL 12.5 MG/5ML PO ELIX: 12.5000 mg | ORAL_SOLUTION | Freq: Four times a day (QID) | ORAL | Status: DC | PRN

## 2020-07-02 MED ORDER — FENTANYL CITRATE (PF) 100 MCG/2ML IJ SOLN
INTRAMUSCULAR | Status: AC
  Filled 2020-07-02: qty 2

## 2020-07-02 MED ORDER — SODIUM CHLORIDE 0.9 % IR SOLN
Status: DC | PRN
  Administered 2020-07-02: 21000 mL

## 2020-07-02 MED ORDER — DIPHENHYDRAMINE HCL 50 MG/ML IJ SOLN: 12.5000 mg | Freq: Four times a day (QID) | INTRAMUSCULAR | Status: DC | PRN

## 2020-07-02 MED ORDER — AMLODIPINE BESYLATE 5 MG PO TABS
5.0000 mg | ORAL_TABLET | Freq: Every day | ORAL | Status: DC
  Administered 2020-07-03: 5 mg via ORAL
  Filled 2020-07-02: qty 1

## 2020-07-02 MED ORDER — PHENYLEPHRINE 40 MCG/ML (10ML) SYRINGE FOR IV PUSH (FOR BLOOD PRESSURE SUPPORT)
PREFILLED_SYRINGE | INTRAVENOUS | Status: DC | PRN
  Administered 2020-07-02 (×2): 160 ug via INTRAVENOUS
  Administered 2020-07-02: 200 ug via INTRAVENOUS
  Administered 2020-07-02 (×2): 160 ug via INTRAVENOUS

## 2020-07-02 MED ORDER — COQ10 100 MG PO CAPS: 100.0000 mg | ORAL_CAPSULE | Freq: Every day | ORAL | Status: DC

## 2020-07-02 MED ORDER — ONDANSETRON HCL 4 MG/2ML IJ SOLN
INTRAMUSCULAR | Status: AC
  Filled 2020-07-02: qty 2

## 2020-07-02 MED ORDER — SODIUM CHLORIDE 0.9 % IR SOLN
3000.0000 mL | Status: DC
  Administered 2020-07-02 (×3): 3000 mL

## 2020-07-02 MED ORDER — ONDANSETRON HCL 4 MG/2ML IJ SOLN: 4.0000 mg | INTRAMUSCULAR | Status: DC | PRN

## 2020-07-02 MED ORDER — ORAL CARE MOUTH RINSE: 15.0000 mL | Freq: Once | OROMUCOSAL | Status: AC

## 2020-07-02 MED ORDER — FENTANYL CITRATE (PF) 100 MCG/2ML IJ SOLN
INTRAMUSCULAR | Status: DC | PRN
  Administered 2020-07-02: 50 ug via INTRAVENOUS

## 2020-07-02 MED ORDER — CHLORHEXIDINE GLUCONATE 0.12 % MT SOLN
15.0000 mL | Freq: Once | OROMUCOSAL | Status: AC
  Administered 2020-07-02: 15 mL via OROMUCOSAL

## 2020-07-02 MED ORDER — HYDROCODONE-ACETAMINOPHEN 5-325 MG PO TABS: 1.0000 | ORAL_TABLET | ORAL | 0 refills | Status: DC | PRN

## 2020-07-02 MED ORDER — BELLADONNA ALKALOIDS-OPIUM 16.2-60 MG RE SUPP
RECTAL | Status: AC
  Filled 2020-07-02: qty 1

## 2020-07-02 MED ORDER — LIDOCAINE 2% (20 MG/ML) 5 ML SYRINGE
INTRAMUSCULAR | Status: AC
  Filled 2020-07-02: qty 5

## 2020-07-02 MED ORDER — ONDANSETRON HCL 4 MG/2ML IJ SOLN: 4.0000 mg | Freq: Once | INTRAMUSCULAR | Status: DC | PRN

## 2020-07-02 MED ORDER — OXYBUTYNIN CHLORIDE 5 MG PO TABS: 5.0000 mg | ORAL_TABLET | Freq: Three times a day (TID) | ORAL | Status: DC | PRN

## 2020-07-02 MED ORDER — OXYCODONE HCL 5 MG PO TABS
5.0000 mg | ORAL_TABLET | Freq: Once | ORAL | Status: DC | PRN
Start: 2020-07-02 — End: 2020-07-02

## 2020-07-02 MED ORDER — HYDROCHLOROTHIAZIDE 25 MG PO TABS
25.0000 mg | ORAL_TABLET | Freq: Every day | ORAL | Status: DC
  Administered 2020-07-02 – 2020-07-03 (×2): 25 mg via ORAL
  Filled 2020-07-02 (×2): qty 1

## 2020-07-02 MED ORDER — AMISULPRIDE (ANTIEMETIC) 5 MG/2ML IV SOLN
10.0000 mg | Freq: Once | INTRAVENOUS | Status: DC | PRN
Start: 2020-07-02 — End: 2020-07-02

## 2020-07-02 MED ORDER — HYDROMORPHONE HCL 1 MG/ML IJ SOLN: 0.2500 mg | INTRAMUSCULAR | Status: DC | PRN

## 2020-07-02 MED ORDER — TRAZODONE HCL 50 MG PO TABS
50.0000 mg | ORAL_TABLET | Freq: Every day | ORAL | Status: DC
  Administered 2020-07-02: 50 mg via ORAL
  Filled 2020-07-02: qty 1

## 2020-07-02 MED ORDER — PROPOFOL 10 MG/ML IV BOLUS
INTRAVENOUS | Status: DC | PRN
  Administered 2020-07-02: 50 mg via INTRAVENOUS
  Administered 2020-07-02: 15 mg via INTRAVENOUS

## 2020-07-02 MED ORDER — CIPROFLOXACIN IN D5W 400 MG/200ML IV SOLN
400.0000 mg | Freq: Once | INTRAVENOUS | Status: AC
  Administered 2020-07-02: 400 mg via INTRAVENOUS
  Filled 2020-07-02: qty 200

## 2020-07-02 SURGICAL SUPPLY — 18 items
BAG URINE DRAIN 2000ML AR STRL (UROLOGICAL SUPPLIES) ×2 IMPLANT
BAG URO CATCHER STRL LF (MISCELLANEOUS) ×2 IMPLANT
CATH FOLEY 3WAY 30CC 24FR (CATHETERS) ×2
CATH URTH STD 24FR FL 3W 2 (CATHETERS) ×1 IMPLANT
CLOTH BEACON ORANGE TIMEOUT ST (SAFETY) ×2 IMPLANT
DRAPE FOOT SWITCH (DRAPES) ×2 IMPLANT
ELECT REM PT RETURN 15FT ADLT (MISCELLANEOUS) ×2 IMPLANT
GLOVE SURG ENC MOIS LTX SZ7.5 (GLOVE) ×2 IMPLANT
GOWN STRL REUS W/TWL XL LVL3 (GOWN DISPOSABLE) ×2 IMPLANT
HOLDER FOLEY CATH W/STRAP (MISCELLANEOUS) ×2 IMPLANT
KIT TURNOVER KIT A (KITS) ×2 IMPLANT
LOOP CUT BIPOLAR 24F LRG (ELECTROSURGICAL) ×2 IMPLANT
MANIFOLD NEPTUNE II (INSTRUMENTS) ×2 IMPLANT
PACK CYSTO (CUSTOM PROCEDURE TRAY) ×2 IMPLANT
PENCIL SMOKE EVACUATOR (MISCELLANEOUS) IMPLANT
SYR TOOMEY IRRIG 70ML (MISCELLANEOUS) ×2
SYRINGE TOOMEY IRRIG 70ML (MISCELLANEOUS) ×1 IMPLANT
TUBING CONNECTING 10 (TUBING) ×2 IMPLANT

## 2020-07-02 NOTE — Anesthesia Postprocedure Evaluation (Signed)
Anesthesia Post Note  Patient: Patrick Gray  Procedure(s) Performed: TRANSURETHRAL RESECTION OF THE PROSTATE (TURP) (N/A Bladder)     Patient location during evaluation: PACU Anesthesia Type: General Level of consciousness: awake and alert Pain management: pain level controlled Vital Signs Assessment: post-procedure vital signs reviewed and stable Respiratory status: spontaneous breathing, nonlabored ventilation, respiratory function stable and patient connected to nasal cannula oxygen Cardiovascular status: blood pressure returned to baseline and stable Postop Assessment: no apparent nausea or vomiting Anesthetic complications: no   No complications documented.  Last Vitals:  Vitals:   07/02/20 1530 07/02/20 1545  BP: 129/88 127/85  Pulse: 68 68  Resp: 10 10  Temp: 37 C   SpO2: 99% 92%    Last Pain:  Vitals:   07/02/20 1545  TempSrc:   PainSc: 0-No pain                 Barnet Glasgow

## 2020-07-02 NOTE — Op Note (Signed)
Preoperative diagnosis: 1. Bladder outlet obstruction secondary to BPH  Postoperative diagnosis:  1. Bladder outlet obstruction secondary to BPH  Procedure:  1. Cystoscopy 2. Transurethral resection of the prostate  Surgeon: Marton Redwood, III. M.D.  Anesthesia: General  Complications: None  EBL: Minimal  Specimens: 1. Prostate chips  Indication: Patrick Gray is a patient with bladder outlet obstruction secondary to benign prostatic hyperplasia. After reviewing the management options for treatment, he elected to proceed with the above surgical procedure(s). We have discussed the potential benefits and risks of the procedure, side effects of the proposed treatment, the likelihood of the patient achieving the goals of the procedure, and any potential problems that might occur during the procedure or recuperation. Informed consent has been obtained.  Description of procedure:  The patient was taken to the operating room and general anesthesia was induced.  The patient was placed in the dorsal lithotomy position, prepped and draped in the usual sterile fashion, and preoperative antibiotics were administered. A preoperative time-out was performed.   Cystourethroscopy was performed.  The patient's urethra was examined and demonstrated bilobar prostatic hypertrophy.   The bladder was then systematically examined in its entirety. There was no evidence of any bladder tumors, stones, or other mucosal pathology.  The ureteral orifices were identified and marked so as to be avoided during the procedure.  The prostate adenoma was then resected utilizing loop cautery resection with the bipolar cutting loop.  The prostate adenoma from the bladder neck back to the verumontanum was resected beginning at the six o'clock position and then extended to include the right and left lobes of the prostate and anterior prostate. Care was taken not to resect distal to the verumontanum.  Hemostasis was then  achieved with the cautery and the bladder was emptied and reinspected with no significant bleeding noted at the end of the procedure.    A 24 French 3 way catheter was then placed into the bladder.  The patient appeared to tolerate the procedure well and without complications.  The patient was able to be awakened and transferred to the recovery unit in satisfactory condition.

## 2020-07-02 NOTE — Transfer of Care (Signed)
Immediate Anesthesia Transfer of Care Note  Patient: Patrick Gray  Procedure(s) Performed: TRANSURETHRAL RESECTION OF THE PROSTATE (TURP) (N/A Bladder)  Patient Location: PACU  Anesthesia Type:General  Level of Consciousness: awake, alert  and oriented  Airway & Oxygen Therapy: Patient Spontanous Breathing and Patient connected to face mask  Post-op Assessment: Report given to RN and Post -op Vital signs reviewed and stable  Post vital signs: Reviewed and stable  Last Vitals:  Vitals Value Taken Time  BP 129/88 07/02/20 1530  Temp    Pulse 68 07/02/20 1531  Resp 10 07/02/20 1531  SpO2 96 % 07/02/20 1531  Vitals shown include unvalidated device data.  Last Pain:  Vitals:   07/02/20 1215  TempSrc: Oral         Complications: No complications documented.

## 2020-07-02 NOTE — Progress Notes (Addendum)
PHARMACIST - PHYSICIAN ORDER COMMUNICATION  CONCERNING: P&T Medication Policy on Herbal Medications  DESCRIPTION:  This patient's order for:  Co Q 10 & has been noted.  This product(s) is classified as an "herbal" or natural product. Due to a lack of definitive safety studies or FDA approval, nonstandard manufacturing practices, plus the potential risk of unknown drug-drug interactions while on inpatient medications, the Pharmacy and Therapeutics Committee does not permit the use of "herbal" or natural products of this type within [facility].  Zinc Gluconate is an OTC Zinc formulation - not stocked in the [place]: The pharmacy department is unable to verify this order at this time and your patient has been informed of this safety policy. Please reevaluate patient's clinical condition at discharge and address if the herbal or natural product(s) should be resumed at that time. PHARMACIST - PHYSICIAN ORDER COMMUNICATION  Minda Ditto PharmD Phone [phone] 07/02/2020, 5:22 PM

## 2020-07-02 NOTE — H&P (Signed)
CC/HPI: CC: Urinary retention  HPI:  05/05/2020  65 year old male was recently hospitalized and taken to the operating room for ACDF of C5-C6 and C6-7. Hospital course was complicated by urinary retention. Apparently last night, he cut his catheter in half and pulled it out. He claims he does not remember any of this. He is here with his wife. He has been having some altered mental status but is okay right now. He is planning to see his primary care physician in the morning. He has not voided yet. Bladder scan is 494. He reports that his PSA was normal couple of months ago.   05/19/2020: Tamsulosin started at last office visit, follows up today for repeat exam with trial of void. PCP sent over recent PSA performed in February of this year, it was 2.6.   Tolerating tamsulosin well without any noted side effect. His catheter has been draining appropriately by his report. He denies significant painful urgency or leaking around Catheter tubing. He denies gross hematuria. No interval fevers or chills, nausea/vomiting. He denies constipation but does continue on pain medication from his recent surgical procedure. Mental status has also improved compared to time of last office visit assessment. This is correlated with his wife who was present today as well.   06/22/2020  Patient underwent a urodynamics that revealed the following:   UDS SUMMARY  Mr. held a max capacity of approx. 368 mls. His 1st sensation was felt at 108 mls. There was positive low amplitude instability but no leaking was noted. He was able to generate a voluntary contraction but was unable void. Rectal spasms interfered some with tracing of his contractions. Trabeculation and some elevation of the base of the bladder was noted. No reflux was noted. A 16 FR coude was inserted after his study.   He presents today for cystoscopy.     ALLERGIES: opioid medication Penicillin    MEDICATIONS: Metoprolol Tartrate  Tamsulosin Hcl 0.4 mg  capsule 1 capsule PO Daily  Amlodipine Besilate  Lisinopril-Hydrochlorothiazide  Potassium  Sildenafil Citrate 20 mg tablet  Tramadol Hcl 50 mg tablet  Trazodone Hcl 50 mg tablet     GU PSH: Complex cystometrogram, w/ void pressure and urethral pressure profile studies, any technique - 06/03/2020 Complex Uroflow - 06/03/2020 Emg surf Electrd - 06/03/2020 Inject For cystogram - 06/03/2020 Intrabd voidng Press - 06/03/2020     NON-GU PSH: Back surgery Foot surgery (unspecified) Heart Surgery (Unspecified) Shoulder Arthroscopy/surgery, Left     GU PMH: Urinary Retention - 06/03/2020, - 05/19/2020, - 05/05/2020 BPH w/LUTS - 05/05/2020    NON-GU PMH: Anxiety Arrhythmia Arthritis Atrial Fibrillation Hypercholesterolemia Hypertension    FAMILY HISTORY: 2 daughters - Other   SOCIAL HISTORY: Marital Status: Married Preferred Language: English; Race: Perren Current Smoking Status: Patient does not smoke anymore. Has not smoked since 04/20/1980.   Tobacco Use Assessment Completed: Used Tobacco in last 30 days? Drinks 2 caffeinated drinks per day.    REVIEW OF SYSTEMS:    GU Review Male:   Patient denies frequent urination, hard to postpone urination, burning/ pain with urination, get up at night to urinate, leakage of urine, stream starts and stops, trouble starting your stream, have to strain to urinate , erection problems, and penile pain.  Gastrointestinal (Upper):   Patient denies nausea, vomiting, and indigestion/ heartburn.  Gastrointestinal (Lower):   Patient denies constipation and diarrhea.  Constitutional:   Patient denies fever, night sweats, weight loss, and fatigue.  Skin:   Patient denies skin rash/  lesion and itching.  Eyes:   Patient denies blurred vision and double vision.  Ears/ Nose/ Throat:   Patient denies sore throat and sinus problems.  Hematologic/Lymphatic:   Patient denies swollen glands and easy bruising.  Cardiovascular:   Patient denies leg swelling and  chest pains.  Respiratory:   Patient denies cough and shortness of breath.  Endocrine:   Patient denies excessive thirst.  Musculoskeletal:   Patient denies back pain and joint pain.  Neurological:   Patient denies headaches and dizziness.  Psychologic:   Patient denies depression and anxiety.   VITAL SIGNS: None   Complexity of Data:  Source Of History:  Patient  Records Review:   Previous Patient Records  Urodynamics Review:   Review Urodynamics Tests   PROCEDURES:         Flexible Cystoscopy - 52000  Risks, benefits, and some of the potential complications of the procedure were discussed at length with the patient including infection, bleeding, voiding discomfort, urinary retention, fever, chills, sepsis, and others. All questions were answered. Informed consent was obtained. Antibiotic prophylaxis was given. Sterile technique and intraurethral analgesia were used.  Meatus:  Normal size. Normal location. Normal condition.  Urethra:  No strictures.  External Sphincter:  Normal.  Verumontanum:  Normal.  Prostate:  Obstructing. Severe hyperplasia. Approximately 3-4 cm prostate with bilobar hypertrophy. No big median lobe.  Bladder Neck:  Non-obstructing.  Ureteral Orifices:  Normal location. Normal size. Normal shape  Bladder:  No trabeculation. No tumors. Normal mucosa. No stones.      The lower urinary tract was carefully examined. The procedure was well-tolerated and without complications. Antibiotic instructions were given. Instructions were given to call the office immediately for bloody urine, difficulty urinating, urinary retention, painful or frequent urination, fever, chills, nausea, vomiting or other illness. The patient stated that he understood these instructions and would comply with them.        Simple Foley Catheterization - [id number]  A 16 French Foley catheter was inserted into the bladder using sterile technique. The patient was taught routine catheter care. Hand  irrigation of the bladder with sterile water was performed. A leg bag was connected. 200 cc of urine was obtained.   ASSESSMENT:      ICD-10 Details  1 GU:   BPH w/LUTS - N40.1 Chronic, Stable  2   Urinary Retention - R33.8 Chronic, Stable     PLAN:           Document Letter(s):  Created for Patient: Clinical Summary         Notes:   The patient has failed medical management for his lower urinary tract symptoms. He would like to proceed with surgical resection. I discussed bipolar transurethral resection of the prostate. I specifically discussed the risks including but not limited to bleeding which could require blood transfusion, infection, and injury to surrounding structures. Also discussed the possibility that the surgery would not improve symptoms though most men have a great improvement in their symptoms. Also discussed the low likelihood but possibility of development of new symptoms such as irritative voiding symptoms or urinary incontinence. Most men will have some degree of urinary urgency and discomfort immediately following the surgery that resolves in a short amount of time. He understands that most often this is an outpatient procedure but occasionally patients require hospitalization for continuous bladder irrigation in the event of excess bleeding. He also understands the possibility of being sent home with a urethral catheter. The patient expressed understanding and  is eager to proceed.   Cc: Dr. Moreen Fowler   Signed by Link Snuffer, III, M.D. on 06/22/20 at 4:52 PM (EDT

## 2020-07-02 NOTE — Anesthesia Procedure Notes (Signed)
Procedure Name: LMA Insertion Performed by: Rosaland Lao, CRNA Pre-anesthesia Checklist: Patient identified, Emergency Drugs available, Suction available and Patient being monitored Patient Re-evaluated:Patient Re-evaluated prior to induction Oxygen Delivery Method: Circle system utilized Preoxygenation: Pre-oxygenation with 100% oxygen Induction Type: IV induction LMA: LMA with gastric port inserted LMA Size: 4.0 Number of attempts: 1 Placement Confirmation: positive ETCO2 and breath sounds checked- equal and bilateral Tube secured with: Tape Dental Injury: Teeth and Oropharynx as per pre-operative assessment

## 2020-07-02 NOTE — Plan of Care (Signed)
Care plan initiated.

## 2020-07-03 ENCOUNTER — Encounter (HOSPITAL_COMMUNITY): Payer: Self-pay | Admitting: Urology

## 2020-07-03 DIAGNOSIS — C61 Malignant neoplasm of prostate: Secondary | ICD-10-CM | POA: Diagnosis not present

## 2020-07-03 MED ORDER — CHLORHEXIDINE GLUCONATE CLOTH 2 % EX PADS: 6.0000 | MEDICATED_PAD | Freq: Every day | CUTANEOUS | Status: DC

## 2020-07-03 NOTE — Discharge Summary (Signed)
Date of admission: 07/02/2020  Date of discharge: 07/03/2020  Admission diagnosis: BPH with LUTS  Discharge diagnosis: BPH with LUTS  Procedures: TURP with Dr. Gloriann Loan  History and Physical: For full details, please see admission history and physical. Briefly, Patrick Gray is a 65 y.o. year old patient with refractory BPH with LUTS, status post TURP on 07/02/2020 with Dr. Gloriann Loan.   Hospital Course: Postoperatively, the patient was monitored on the floor with CBI.  On postop day 1, the patient's catheter was draining clear to light pink urine with minimal CBI.  He was ambulating without difficulty and tolerating a regular diet.  He was discharged home with his Foley catheter.  Physical Exam:  General: Alert and oriented CV: RRR, palpable distal pulses Lungs: CTAB, equal chest rise Abdomen: Soft, NTND, no rebound or guarding GU: Three-way Foley catheter in place and draining clear to light pink urine Ext: NT, No erythema  Laboratory values: Recent Labs    07/02/20 1232  HGB 14.7  HCT 41.4   Recent Labs    07/02/20 1232  CREATININE 0.65    Disposition: Home  Discharge instruction: The patient was instructed to be ambulatory but told to refrain from heavy lifting, strenuous activity, or driving.  Discharge medications:  Allergies as of 07/03/2020      Reactions   Other    opioids hallucination   Oxycodone Other (See Comments)   hallucinations   Penicillins Rash, Other (See Comments)   Has patient had a PCN reaction causing immediate rash, facial/tongue/throat swelling, SOB or lightheadedness with hypotension: No Has patient had a PCN reaction causing severe rash involving mucus membranes or skin necrosis: No Has patient had a PCN reaction that required hospitalization No Has patient had a PCN reaction occurring within the last 10 years: No If all of the above answers are "NO", then may proceed with Cephalosporin use.      Medication List    STOP taking these medications    apixaban 5 MG Tabs tablet Commonly known as: ELIQUIS     TAKE these medications   amLODipine 5 MG tablet Commonly known as: NORVASC Take 5 mg by mouth daily.   atorvastatin 20 MG tablet Commonly known as: LIPITOR Take 20 mg by mouth daily.   CoQ10 100 MG Caps Take 100 mg by mouth daily.   HYDROcodone-acetaminophen 5-325 MG tablet Commonly known as: Norco Take 1 tablet by mouth every 4 (four) hours as needed for moderate pain.   lisinopril-hydrochlorothiazide 20-25 MG tablet Commonly known as: ZESTORETIC Take 1 tablet by mouth daily.   metoprolol tartrate 50 MG tablet Commonly known as: LOPRESSOR TAKE 1 TABLET BY MOUTH TWICE DAILY. MAY TAKE AN EXTRA 1/2 TABLET AS NEEDED FOR BREAKTHROUGH AFIB What changed:   how much to take  how to take this  when to take this   potassium chloride SA 20 MEQ tablet Commonly known as: KLOR-CON Take 1 tablet (20 meq) by mouth in the morning & take 2 tablets (40 meq) by mouth in the evening. What changed:   how much to take  how to take this  when to take this  additional instructions   QUERCETIN PO Take 1 capsule by mouth daily.   sildenafil 20 MG tablet Commonly known as: REVATIO Take 20-100 mg by mouth daily as needed for erectile dysfunction.   tamsulosin 0.4 MG Caps capsule Commonly known as: FLOMAX Take 0.4 mg by mouth daily.   traMADol 50 MG tablet Commonly known as: ULTRAM Take 50  mg by mouth every 6 (six) hours as needed for severe pain.   traZODone 50 MG tablet Commonly known as: DESYREL Take 50 mg by mouth at bedtime.   Vitamin D-3 125 MCG (5000 UT) Tabs Take 5,000 Units by mouth daily.   zinc gluconate 50 MG tablet Take 50 mg by mouth daily.       Followup:   Follow-up Information    Lucas Mallow, MD On 07/05/2020.   Specialty: Urology Contact information: [address] [place] [address] [phone]

## 2020-07-05 LAB — SURGICAL PATHOLOGY

## 2020-07-06 ENCOUNTER — Ambulatory Visit: Payer: BC Managed Care – PPO | Admitting: Neurology

## 2020-08-03 ENCOUNTER — Telehealth: Payer: Self-pay | Admitting: *Deleted

## 2020-08-03 NOTE — Telephone Encounter (Signed)
   [facility] Pre-operative Risk Assessment    Patient Name: Patrick Gray  DOB: 03-31-1955  MRN: [id number]   HEARTCARE STAFF: - Please ensure there is not already an duplicate clearance open for this procedure. - Under Visit Info/Reason for Call, type in Other and utilize the format Clearance MM/DD/YY or Clearance TBD. Do not use dashes or single digits. - If request is for dental extraction, please clarify the # of teeth to be extracted. - If the patient is currently at the dentist's office, call Pre-Op APP to address. If the patient is not currently in the dentist office, please route to the Pre-Op pool  Request for surgical clearance:  What type of surgery is being performed? PROSTATE Bx   When is this surgery scheduled? 08/19/20   What type of clearance is required (medical clearance vs. Pharmacy clearance to hold med vs. Both)? BOTH  Are there any medications that need to be held prior to surgery and how long? ELIQUIS x 3 DAYS PRIOR TO PROCEDURE    Practice name and name of physician performing surgery? [facility]; DR. Link Snuffer   What is the office phone number? [phone]   7.   What is the office fax number? [phone]  8.   Anesthesia type (None, local, MAC, general) ? NOT LISTED   Julaine Hua 08/03/2020, 2:04 PM  _________________________________________________________________   (provider comments below)

## 2020-08-04 NOTE — Telephone Encounter (Signed)
Patient with diagnosis of A Fib on Eliquis for anticoagulation.    Procedure: prostate Bx Date of procedure: 08/19/20   CHA2DS2-VASc Score = 1  This indicates a 0.6% annual risk of stroke. The patient's score is based upon: CHF History: No HTN History: Yes Diabetes History: No Stroke History: No Vascular Disease History: No Age Score: 0 Gender Score: 0       CrCl 150 ml/min Platelet count 150K  Per office protocol, patient can hold Eliquis for 3 days prior to procedure.

## 2020-08-05 ENCOUNTER — Ambulatory Visit (HOSPITAL_COMMUNITY): Payer: BC Managed Care – PPO | Admitting: Nurse Practitioner

## 2020-08-05 NOTE — Telephone Encounter (Signed)
    Patrick Gray DOB:  04-29-1955  MRN:  [id number]   Primary Cardiologist: None  Chart reviewed as part of pre-operative protocol coverage. Given past medical history and time since last visit, based on ACC/AHA guidelines, Patrick Gray would be at acceptable risk for the planned procedure without further cardiovascular testing. The patient has been doing well from a CV standpoint. He can easily perform 4METS or greater without difficulty.   Patient with diagnosis of A Fib on Eliquis for anticoagulation.     Procedure: prostate Bx Date of procedure: 08/19/20   CHA2DS2-VASc Score = 1  This indicates a 0.6% annual risk of stroke. The patient's score is based upon: CHF History: No HTN History: Yes Diabetes History: No Stroke History: No Vascular Disease History: No Age Score: 0 Gender Score: 0   CrCl 150 ml/min Platelet count 150K   Per office protocol, patient can hold Eliquis for 3 days prior to procedure.    The patient was advised that if he develops new symptoms prior to surgery to contact our office to arrange for a follow-up visit, and he verbalized understanding.  I will route this recommendation to the requesting party via Epic fax function and remove from pre-op pool.  Please call with questions.  Kathyrn Drown, NP 08/05/2020, 9:37 AM

## 2020-09-30 ENCOUNTER — Ambulatory Visit (HOSPITAL_COMMUNITY)
Admission: RE | Admit: 2020-09-30 | Discharge: 2020-09-30 | Disposition: A | Discharge status: Home / Self Care | Payer: Medicare PPO | Source: Ambulatory Visit | Attending: Nurse Practitioner | Admitting: Nurse Practitioner

## 2020-09-30 ENCOUNTER — Other Ambulatory Visit: Payer: Self-pay

## 2020-09-30 ENCOUNTER — Encounter (HOSPITAL_COMMUNITY): Payer: Self-pay | Admitting: Nurse Practitioner

## 2020-09-30 VITALS — BP 156/94 | HR 58 | Ht 71.0 in | Wt 200.4 lb

## 2020-09-30 DIAGNOSIS — Z79899 Other long term (current) drug therapy: Secondary | ICD-10-CM | POA: Insufficient documentation

## 2020-09-30 DIAGNOSIS — I10 Essential (primary) hypertension: Secondary | ICD-10-CM | POA: Insufficient documentation

## 2020-09-30 DIAGNOSIS — Z8249 Family history of ischemic heart disease and other diseases of the circulatory system: Secondary | ICD-10-CM | POA: Diagnosis not present

## 2020-09-30 DIAGNOSIS — I480 Paroxysmal atrial fibrillation: Secondary | ICD-10-CM | POA: Insufficient documentation

## 2020-09-30 DIAGNOSIS — Z885 Allergy status to narcotic agent status: Secondary | ICD-10-CM | POA: Diagnosis not present

## 2020-09-30 DIAGNOSIS — I4819 Other persistent atrial fibrillation: Secondary | ICD-10-CM | POA: Diagnosis not present

## 2020-09-30 DIAGNOSIS — Z880 Allergy status to penicillin: Secondary | ICD-10-CM | POA: Insufficient documentation

## 2020-09-30 DIAGNOSIS — Z87891 Personal history of nicotine dependence: Secondary | ICD-10-CM | POA: Insufficient documentation

## 2020-09-30 DIAGNOSIS — D6869 Other thrombophilia: Secondary | ICD-10-CM

## 2020-09-30 DIAGNOSIS — Z7901 Long term (current) use of anticoagulants: Secondary | ICD-10-CM | POA: Diagnosis not present

## 2020-09-30 NOTE — Progress Notes (Signed)
Patient ID: Patrick Gray, male   DOB: 15-Sep-1955, 65 y.o.   MRN: [id number]     Primary Care Physician: [name], MD Referring Physician: Telesforo Campe is a 65 y.o. male with a h/o afib 2015 found by primary. Was placed on flecainide 100 mg bid and has done well until seen 6/20 and was noted to be in aflutter with RVR.Because of breakthrough afib, he was scheduled for ablation 11/30/15.  F/u in afib clinic 3 13. He was pending shoulder surgery   and has been in afib x 2 weeks. Flecainide was decreased to 50 mg bid, as he was told he could try to wean off after cardioversion. and within several days he returned to afib.   After shoulder surgery, he did have afib for a couple of weeks and did return to [name] on his own.  He is in the afib clinic 11/15/16. He has returned to afib since the beginning of September. He had been trying to wean of flecainide  again. He has has some tremors and when he tries to wean off drug, the tremors get better. He is toying with the idea of having  another ablation vrs a cardioversion but has guests this week into next week and will wait until then to make the decision. He is tolerating well and is rate controlled. No missed doses of eliquis. He wishes to pursue another ablation.  F/u in fib clinic after 2nd ablation 12/19/16. He felt well until yesterday PM when he noted some wheezing, shortness of breath and could not lie flat last night to sleep. HR has been regular. No fever, chills, chest pain, cough. His weight is noted to be up 10 lbs from my last clinic visit around one month ago. He feels bloated in his abdomen.  F/u in afib clinic 12/26/16. He was started on lasix on last visit for fluid overload following ablation. He felt well after the first dose of lasix and by Saturday night( 2 doses), he was able to lie flat to sleep. He did not take lasix this am weight is down 8 lbs. Maintaining SR.  Pt is here for one month f/u ablation. He has not noted  any further afib. Weight is stable.Compliant with eliquis without any missed doses.  Pt being seen in clinic after calling to office, being out of rhythm x 2 weeks. He feels the trigger was waiting almost 2 weeks for test results for his wife, who has a h/o cancer. She got better news  than was expected but she is still pending surgery in the near future. Pt had stopped flecainide for worsening tremor of left arm, improved off flecainide. He took two doses of flecainide when he got out of rhythm and tremor worsened, so he says that he will no longer try flecainide. He states that he has never had tremor evaluated by neurologist, has mentioned to PCP.  F/u in afib clinic, 4/16. S/p successful cardioversion. He feels well. EKG shows SR.   F/u in afib clinic 9/16. He called to the clinic last week and asked to  increase his metoprolol as his HR's were elevated. His EKG shows atrial flutter, rate controlled. He has had the stress of his wife recently undergoing breast CA surgery x 3, his mother recently died and his adult Autistic daughter has been having some issues.  F/u in clinic 12/03/17, he had successful cardioversion and is still maintaining SR.   F/u in afib clinic, 05/31/20.  I have not seen pt for several years. He was seen by Dr. Rayann Heman in early March for pre op clearance for neck injury after a fall. He was found to be in afib at that time. He was given this appointment to see about scheduling DCCV 4 weeks after surgery if he remained out of rhythm. 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    He wife reports that he had a very difficult post op course complicated by post op delirium with aggressive behavior requiring restraints  and neurogenic bladder from  which he is still wearing a catheter. He is very weak and is supposed to  start physical therapy soon. He is rate controlled and does not seem to be bothered  with his rhythm at this time. He does not wish to have any other procedures until he is back to his baseline from surgery.   F/u in afib clinic, 09/30/20. He had  BPH with LUTS in May and bx came back normal. He is in SR today and for the most part has felt very well since the surgery. There was a question of CV if afib persisted but it appears he is in SR most of the time now. He is getting ready to fly to [place] for his daughter's wedding. He has a fear of flying and we discussed possibly afib could be induced by anxiety or higher elevation and to make sure he takes his medicine. He  lost 28 lbs since the the 2 surgeries and is going to the gym to maintain.   Today, he denies symptoms of palpitations, chest pain, shortness of breath, orthopnea, PND, lower extremity edema, dizziness, presyncope, syncope, or neurologic sequela. Positive for fatigue in afib , hand tremor. The patient is tolerating medications without difficulties and is otherwise without complaint today.   Past Medical History:  Diagnosis Date   Acoustic neuroma ([place])    left   Anxiety    Arthritis    Complication of anesthesia    Delusion, violent, hallucination, hospitalized 8 days, urine retention   Deafness in left ear    Delusion ([place])    after fall on pain meds and prednisone   Dysrhythmia    PAF   Foley catheter in place    H/O cardiac radiofrequency ablation    HTN (hypertension)    Hypercholesterolemia    Hypopotassemia    Insomnia    Paroxysmal atrial fibrillation (H[facility])    Radiculopathy    after fall   Tremor    left hand   Vitamin B 12 deficiency    Past Surgical History:  Procedure Laterality Date   ACOUSTIC NEUROMA RESECTION Left 19990   ANTERIOR CERVICAL DECOMP/DISCECTOMY FUSION N/A 04/26/2020   Procedure: Anterior Cervical Decompression Fusion - Cervical five-Cervical six - Cervical six-Cervical seven;  Surgeon: Kary Kos, MD;  Location: [place];  Service: Neurosurgery;  Laterality: N/A;    APPENDECTOMY  1976   ATRIAL FIBRILLATION ABLATION N/A 12/19/2016   Procedure: ATRIAL FIBRILLATION ABLATION;  Surgeon: Thompson Grayer, MD;  Location: [facility];  Service: Cardiovascular;  Laterality: N/A;   CARDIAC CATHETERIZATION N/A 10/07/2015   Procedure: Left Heart Cath and Coronary Angiography;  Surgeon: Josue Hector, MD;  Location: [place] [facility];  Service: Cardiovascular;  Laterality: N/A;   CARDIOVERSION N/A 06/17/2013   Procedure: CARDIOVERSION;  Surgeon: Josue Hector, MD;  Location: [place];  Service: Cardiovascular;  Laterality: N/A;  CARDIOVERSION N/A 05/11/2016   Procedure: CARDIOVERSION;  Surgeon: Sanda Klein, MD;  Location: [place] [facility];  Service: Cardiovascular;  Laterality: N/A;   CARDIOVERSION N/A 05/24/2017   Procedure: CARDIOVERSION;  Surgeon: Sueanne Margarita, MD;  Location: [facility];  Service: Cardiovascular;  Laterality: N/A;   CARDIOVERSION N/A 11/19/2017   Procedure: CARDIOVERSION;  Surgeon: Pixie Casino, MD;  Location: [facility];  Service: Cardiovascular;  Laterality: N/A;   [place] STUDY N/A 11/30/2015   Procedure: Atrial Fibrillation Ablation;  Surgeon: Thompson Grayer, MD;  Location: [place] [facility];  Service: Cardiovascular;  Laterality: N/A;   TEE WITHOUT CARDIOVERSION N/A 05/11/2016   Procedure: TRANSESOPHAGEAL ECHOCARDIOGRAM (TEE);  Surgeon: Sanda Klein, MD;  Location: [place];  Service: Cardiovascular;  Laterality: N/A;   [place] Left 06/15/2016   Procedure: TOTAL SHOULDER ARTHROPLASTY;  Surgeon: Tania Ade, MD;  Location: [place];  Service: Orthopedics;  Laterality: Left;  Left total shoulder arthroplasty   TRANSURETHRAL RESECTION OF PROSTATE N/A 07/02/2020   Procedure: TRANSURETHRAL RESECTION OF THE PROSTATE (TURP);  Surgeon: Lucas Mallow, MD;  Location: [facility];  Service: Urology;  Laterality: N/A;    Current  Outpatient Medications  Medication Sig Dispense Refill   amLODipine (NORVASC) 5 MG tablet Take 5 mg by mouth daily.     atorvastatin (LIPITOR) 20 MG tablet Take 20 mg by mouth daily.     Cholecalciferol (VITAMIN D-3) 5000 units TABS Take 5,000 Units by mouth daily.     Coenzyme Q10 (COQ10) 100 MG CAPS Take 100 mg by mouth daily.     ELIQUIS 5 MG TABS tablet Take 5 mg by mouth 2 (two) times daily.     lisinopril-hydrochlorothiazide (PRINZIDE,ZESTORETIC) 20-25 MG per tablet Take 1 tablet by mouth daily.      metoprolol tartrate (LOPRESSOR) 50 MG tablet TAKE 1 TABLET BY MOUTH TWICE DAILY. MAY TAKE AN EXTRA 1/2 TABLET AS NEEDED FOR BREAKTHROUGH AFIB (Patient taking differently: Take 50 mg by mouth 2 (two) times daily. TAKE 1 TABLET BY MOUTH TWICE DAILY. MAY TAKE AN EXTRA 1/2 TABLET AS NEEDED FOR BREAKTHROUGH AFIB) 200 tablet 3   potassium chloride SA (K-DUR) 20 MEQ tablet Take 1 tablet (20 meq) by mouth in the morning & take 2 tablets (40 meq) by mouth in the evening. (Patient taking differently: Take 20-40 mEq by mouth See admin instructions. Take 2 tablet (40 meq) by mouth in the morning & take 1 tablet (20 meq) by mouth in the evening.) 90 tablet 6   QUERCETIN PO Take 1 capsule by mouth daily.     sildenafil (REVATIO) 20 MG tablet Take 20-100 mg by mouth daily as needed for erectile dysfunction.  0   traMADol (ULTRAM) 50 MG tablet Take 50 mg by mouth every 6 (six) hours as needed for severe pain.     traZODone (DESYREL) 50 MG tablet Take 50 mg by mouth at bedtime.     zinc gluconate 50 MG tablet Take 50 mg by mouth daily.     No current facility-administered medications for this encounter.    Allergies  Allergen Reactions   Hydrocodone Other (See Comments)   Other Other (See Comments)    opioids hallucination   Oxycodone Other (See Comments)    hallucinations   Prednisolone Other (See Comments)   Penicillins Rash and Other (See Comments)    Has patient had a PCN reaction causing immediate  rash, facial/tongue/throat swelling, SOB or lightheadedness with hypotension: No Has patient had a PCN reaction causing severe rash involving mucus membranes or skin necrosis: No Has patient had a PCN reaction that required hospitalization No Has patient had a PCN reaction occurring within the last 10 years: No If all of the above answers are "NO", then may proceed with Cephalosporin use.     Social History   Socioeconomic History   Marital status: Married    Spouse name: Not on file   Number of children: Not on file   Years of education: Not on file   Highest education level: Not on file  Occupational History   Not on file  Tobacco Use   Smoking status: Former   Smokeless tobacco: Never   Tobacco comments:    smoke in college- social  Vaping Use   Vaping Use: Never used  Substance and Sexual Activity   Alcohol use: Yes    Alcohol/week: 14.0 standard drinks    Types: 14 Cans of beer per week   Drug use: No   Sexual activity: Not on file  Other Topics Concern   Not on file  Social History Narrative   Lives in [place] wife.  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   Social Determinants of Health   Financial Resource Strain: Not on file  Food Insecurity: Not on file  Transportation Needs: Not on file  Physical Activity: Not on file  Stress: Not on file  Social Connections: Not on file  Intimate Partner Violence: Not on file    Family History  Problem Relation Age of Onset   Hypertension Father    Hypertension Mother    Stroke Mother    CVA Mother    Hypertension Brother    Hypercholesterolemia Brother    Atrial fibrillation Brother    Congestive Heart Failure Brother     ROS- All systems are reviewed and negative except as per the HPI above  Physical Exam: Vitals:   09/30/20 0950  BP: (!) 156/94  Pulse: (!) 58  Weight: 90.9 kg  Height: '5\' 11"'$  (1.803 m)    GEN- The patient is well appearing, alert and oriented x 3 today.   Head- normocephalic,  atraumatic Eyes-  Sclera clear, conjunctiva pink Ears- hearing intact Oropharynx- clear Neck- supple, no JVP Lymph- no cervical lymphadenopathy Lungs- Clear to ausculation bilaterally, normal work of breathing, few rales hears in lung bases Heart regular rate and rhythm, no murmurs, rubs or gallops, PMI not laterally displaced GI- soft, NT, ND, + BS Extremities- no clubbing, cyanosis, or edema MS- no significant deformity or atrophy Skin- no rash or lesion Psych- euthymic mood, full affect Neuro- strength and sensation are intact  EKG- Sinus brady 58 bpm, pr int 174 ms, qrs int 96 ms, qtc 429 ms  Epic records reviewed    Assessment and Plan: 1.  Paroxymal atrial flutter, s/p afib ablation 11/30/15 and 12/19/16 Successful cardioversion 11/19/17 Found to be in afib in March  prior to neck  surgery several months ago  Dr. Rayann Heman scheduled  appointment 4 weeks after his surgery  to discuss cardioversion at which time  He was  unaware of his rhythm, rate controlled  Had a difficult post op course and did not want any other procedures done until he can reagin his strength from  his surgery  He has since had prostate surgery in May  He has  returned to [place] spontaneously  Continue eliquis for chadsvasc score of 1  Continue  metoprolol 50 mg bid  2. HTN Stable Elevated on presentation but states at home BP runs around 130/80   I will see back in 6 months    Butch Penny C. Allizon Woznick, [facility] [address] [place], [place] [address] [phone]

## 2020-11-07 ENCOUNTER — Other Ambulatory Visit: Payer: Self-pay | Admitting: Internal Medicine

## 2020-11-08 NOTE — Telephone Encounter (Signed)
Prescription refill request for Eliquis received. Indication:Afib  Last office visit: 09/30/20 Patrick Gray)  Scr: 0.65 (07/02/20) Age: 65 Weight: 90.9kg  Appropriate dose and refill sent to requested pharmacy.

## 2021-01-26 ENCOUNTER — Other Ambulatory Visit: Payer: Self-pay | Admitting: Urology

## 2021-01-26 DIAGNOSIS — C61 Malignant neoplasm of prostate: Secondary | ICD-10-CM

## 2021-02-07 ENCOUNTER — Other Ambulatory Visit: Payer: Self-pay | Admitting: Internal Medicine

## 2021-02-07 NOTE — Telephone Encounter (Signed)
Eliquis 5 mg refill request received. Patient is 65 years old, weight- 90.9 kg, Crea- 0.65 on 07/02/20 , Diagnosis-, and last seen by Roderic Palau on 09/30/20. Dose is appropriate based on dosing criteria. Will send in refill to requested pharmacy.  afib

## 2021-03-02 ENCOUNTER — Ambulatory Visit
Admission: RE | Admit: 2021-03-02 | Discharge: 2021-03-02 | Disposition: A | Discharge status: Home / Self Care | Payer: Medicare PPO | Source: Ambulatory Visit | Attending: Urology | Admitting: Urology

## 2021-03-02 DIAGNOSIS — C61 Malignant neoplasm of prostate: Secondary | ICD-10-CM

## 2021-03-02 MED ORDER — GADOBENATE DIMEGLUMINE 529 MG/ML IV SOLN
20.0000 mL | Freq: Once | INTRAVENOUS | Status: AC | PRN
  Administered 2021-03-02: 20 mL via INTRAVENOUS

## 2021-03-11 DIAGNOSIS — N400 Enlarged prostate without lower urinary tract symptoms: Secondary | ICD-10-CM | POA: Diagnosis not present

## 2021-03-11 DIAGNOSIS — C61 Malignant neoplasm of prostate: Secondary | ICD-10-CM | POA: Diagnosis not present

## 2021-04-14 ENCOUNTER — Encounter (HOSPITAL_COMMUNITY): Payer: Self-pay | Admitting: Nurse Practitioner

## 2021-04-14 ENCOUNTER — Ambulatory Visit (HOSPITAL_COMMUNITY)
Admission: RE | Admit: 2021-04-14 | Discharge: 2021-04-14 | Disposition: A | Discharge status: Home / Self Care | Payer: Medicare PPO | Source: Ambulatory Visit | Attending: Nurse Practitioner | Admitting: Nurse Practitioner

## 2021-04-14 ENCOUNTER — Other Ambulatory Visit: Payer: Self-pay

## 2021-04-14 VITALS — BP 134/84 | HR 88 | Ht 71.0 in | Wt 210.2 lb

## 2021-04-14 DIAGNOSIS — Z7901 Long term (current) use of anticoagulants: Secondary | ICD-10-CM | POA: Insufficient documentation

## 2021-04-14 DIAGNOSIS — I10 Essential (primary) hypertension: Secondary | ICD-10-CM | POA: Diagnosis not present

## 2021-04-14 DIAGNOSIS — I4892 Unspecified atrial flutter: Secondary | ICD-10-CM | POA: Insufficient documentation

## 2021-04-14 DIAGNOSIS — D6869 Other thrombophilia: Secondary | ICD-10-CM | POA: Diagnosis not present

## 2021-04-14 DIAGNOSIS — I4819 Other persistent atrial fibrillation: Secondary | ICD-10-CM | POA: Diagnosis not present

## 2021-04-14 NOTE — Progress Notes (Signed)
Patient ID: Patrick Gray, male   DOB: 1955-10-19, 66 y.o.   MRN: [id number]     Primary Care Physician: [name], MD Referring Physician: Tayari Yankee is a 66 y.o. male with a h/o afib 2015 found by primary. Was placed on flecainide 100 mg bid and has done well until seen 6/20 and was noted to be in aflutter with RVR.Because of breakthrough afib, he was scheduled for ablation 11/30/15.  F/u in afib clinic 3 13. He was pending shoulder surgery   and has been in afib x 2 weeks. Flecainide was decreased to 50 mg bid, as he was told he could try to wean off after cardioversion. and within several days he returned to afib.   After shoulder surgery, he did have afib for a couple of weeks and did return to [place] on his own.  He is in the afib clinic 11/15/16. He has returned to afib since the beginning of September. He had been trying to wean of flecainide  again. He has has some tremors and when he tries to wean off drug, the tremors get better. He is toying with the idea of having  another ablation vrs a cardioversion but has guests this week into next week and will wait until then to make the decision. He is tolerating well and is rate controlled. No missed doses of eliquis. He wishes to pursue another ablation.  F/u in fib clinic after 2nd ablation 12/19/16. He felt well until yesterday PM when he noted some wheezing, shortness of breath and could not lie flat last night to sleep. HR has been regular. No fever, chills, chest pain, cough. His weight is noted to be up 10 lbs from my last clinic visit around one month ago. He feels bloated in his abdomen.  F/u in afib clinic 12/26/16. He was started on lasix on last visit for fluid overload following ablation. He felt well after the first dose of lasix and by Saturday night( 2 doses), he was able to lie flat to sleep. He did not take lasix this am weight is down 8 lbs. Maintaining SR.  Pt is here for one month f/u ablation. He has not noted  any further afib. Weight is stable.Compliant with eliquis without any missed doses.  Pt being seen in clinic after calling to office, being out of rhythm x 2 weeks. He feels the trigger was waiting almost 2 weeks for test results for his wife, who has a h/o cancer. She got better news  than was expected but she is still pending surgery in the near future. Pt had stopped flecainide for worsening tremor of left arm, improved off flecainide. He took two doses of flecainide when he got out of rhythm and tremor worsened, so he says that he will no longer try flecainide. He states that he has never had tremor evaluated by neurologist, has mentioned to PCP.  F/u in afib clinic, 4/16. S/p successful cardioversion. He feels well. EKG shows SR.   F/u in afib clinic 9/16. He called to the clinic last week and asked to  increase his metoprolol as his HR's were elevated. His EKG shows atrial flutter, rate controlled. He has had the stress of his wife recently undergoing breast CA surgery x 3, his mother recently died and his adult Autistic daughter has been having some issues.  F/u in clinic 12/03/17, he had successful cardioversion and is still maintaining SR.   F/u in afib clinic, 05/31/20.  I have not seen pt for several years. He was seen by Dr. Rayann Heman in early March for pre op clearance for neck injury after a fall. He was found to be in afib at that time. He was given this appointment to see about scheduling DCCV 4 weeks after surgery if he remained out of rhythm. 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    He wife reports that he had a very difficult post op course complicated by post op delirium with aggressive behavior requiring restraints  and neurogenic bladder from  which he is still wearing a catheter. He is very weak and is supposed to  start physical therapy soon. He is rate controlled and does not seem to be bothered  with his rhythm at this time. He does not wish to have any other procedures until he is back to his baseline from surgery.   F/u in afib clinic, 09/30/20. He had  BPH with LUTS in May and bx came back normal. He is in SR today and for the most part has felt very well since the surgery. There was a question of CV if afib persisted but it appears he is in SR most of the time now. He is getting ready to fly to [place] for his daughter's wedding. He has a fear of flying and we discussed possibly afib could be induced by anxiety or higher elevation and to make sure he takes his medicine. He  lost 28 lbs since the the 2 surgeries and is going to the gym to maintain.   F/u in afib clinic, 04/14/21. He feels a few weeks ago he returned to afib, rate controlled. He felt fatigued for the first few days but now states that he is not bothered with it. We discussed options to restore SR but for now he wants a rate control strategy.   Today, he denies symptoms of palpitations, chest pain, shortness of breath, orthopnea, PND, lower extremity edema, dizziness, presyncope, syncope, or neurologic sequela. Positive for fatigue in afib , hand tremor. The patient is tolerating medications without difficulties and is otherwise without complaint today.   Past Medical History:  Diagnosis Date   Acoustic neuroma ([place])    left   Anxiety    Arthritis    Complication of anesthesia    Delusion, violent, hallucination, hospitalized 8 days, urine retention   Deafness in left ear    Delusion ([place])    after fall on pain meds and prednisone   Dysrhythmia    PAF   Foley catheter in place    H/O cardiac radiofrequency ablation    HTN (hypertension)    Hypercholesterolemia    Hypopotassemia    Insomnia    Paroxysmal atrial fibrillation (HCC)    Radiculopathy    after fall   Tremor    left hand   Vitamin B 12 deficiency    Past Surgical History:  Procedure Laterality Date   ACOUSTIC NEUROMA RESECTION Left 19990    ANTERIOR CERVICAL DECOMP/DISCECTOMY FUSION N/A 04/26/2020   Procedure: Anterior Cervical Decompression Fusion - Cervical five-Cervical six - Cervical six-Cervical seven;  Surgeon: Kary Kos, MD;  Location: [facility];  Service: Neurosurgery;  Laterality: N/A;   APPENDECTOMY  1976   ATRIAL FIBRILLATION ABLATION N/A 12/19/2016   Procedure: ATRIAL FIBRILLATION ABLATION;  Surgeon: Thompson Grayer, MD;  Location: [facility];  Service: Cardiovascular;  Laterality: N/A;   CARDIAC CATHETERIZATION N/A 10/07/2015  Procedure: Left Heart Cath and Coronary Angiography;  Surgeon: Josue Hector, MD;  Location: [facility];  Service: Cardiovascular;  Laterality: N/A;   CARDIOVERSION N/A 06/17/2013   Procedure: CARDIOVERSION;  Surgeon: Josue Hector, MD;  Location: [facility];  Service: Cardiovascular;  Laterality: N/A;   CARDIOVERSION N/A 05/11/2016   Procedure: CARDIOVERSION;  Surgeon: Sanda Klein, MD;  Location: [facility];  Service: Cardiovascular;  Laterality: N/A;   CARDIOVERSION N/A 05/24/2017   Procedure: CARDIOVERSION;  Surgeon: Sueanne Margarita, MD;  Location: [facility] ENDOSCOPY;  Service: Cardiovascular;  Laterality: N/A;   CARDIOVERSION N/A 11/19/2017   Procedure: CARDIOVERSION;  Surgeon: Pixie Casino, MD;  Location: [facility] ENDOSCOPY;  Service: Cardiovascular;  Laterality: N/A;   [place] N/A 11/30/2015   Procedure: Atrial Fibrillation Ablation;  Surgeon: Thompson Grayer, MD;  Location: [facility];  Service: Cardiovascular;  Laterality: N/A;   TEE WITHOUT CARDIOVERSION N/A 05/11/2016   Procedure: TRANSESOPHAGEAL ECHOCARDIOGRAM (TEE);  Surgeon: Sanda Klein, MD;  Location: [facility];  Service: Cardiovascular;  Laterality: N/A;   [place] Left 06/15/2016   Procedure: TOTAL SHOULDER ARTHROPLASTY;  Surgeon: Tania Ade, MD;  Location: [facility];  Service: Orthopedics;  Laterality:  Left;  Left total shoulder arthroplasty   TRANSURETHRAL RESECTION OF PROSTATE N/A 07/02/2020   Procedure: TRANSURETHRAL RESECTION OF THE PROSTATE (TURP);  Surgeon: Lucas Mallow, MD;  Location: [facility];  Service: Urology;  Laterality: N/A;    Current Outpatient Medications  Medication Sig Dispense Refill   amLODipine (NORVASC) 5 MG tablet Take 5 mg by mouth daily.     atorvastatin (LIPITOR) 20 MG tablet Take 20 mg by mouth daily.     Cholecalciferol (VITAMIN D-3) 5000 units TABS Take 5,000 Units by mouth daily.     Coenzyme Q10 (COQ10) 100 MG CAPS Take 100 mg by mouth daily.     ELIQUIS 5 MG TABS tablet Take 1 tablet by mouth twice daily 180 tablet 0   lisinopril-hydrochlorothiazide (PRINZIDE,ZESTORETIC) 20-25 MG per tablet Take 1 tablet by mouth daily.      metoprolol tartrate (LOPRESSOR) 50 MG tablet TAKE 1 TABLET BY MOUTH TWICE DAILY. MAY TAKE AN EXTRA 1/2 TABLET AS NEEDED FOR BREAKTHROUGH AFIB (Patient taking differently: Take 50 mg by mouth 2 (two) times daily. TAKE 1 TABLET BY MOUTH TWICE DAILY. MAY TAKE AN EXTRA 1/2 TABLET AS NEEDED FOR BREAKTHROUGH AFIB) 200 tablet 3   potassium chloride SA (K-DUR) 20 MEQ tablet Take 1 tablet (20 meq) by mouth in the morning & take 2 tablets (40 meq) by mouth in the evening. (Patient taking differently: Take 20-40 mEq by mouth See admin instructions. Take 2 tablet (40 meq) by mouth in the morning & take 1 tablet (20 meq) by mouth in the evening.) 90 tablet 6   QUERCETIN PO Take 1 capsule by mouth daily.     sildenafil (REVATIO) 20 MG tablet Take 20-100 mg by mouth daily as needed for erectile dysfunction.  0   traMADol (ULTRAM) 50 MG tablet Take 50 mg by mouth every 6 (six) hours as needed for severe pain.     traZODone (DESYREL) 50 MG tablet Take 50 mg by mouth at bedtime.     zinc gluconate 50 MG tablet Take 50 mg by mouth daily.     No current facility-administered medications for this encounter.    Allergies  Allergen Reactions  Hydrocodone  Other (See Comments)   Other Other (See Comments)    opioids hallucination   Oxycodone Other (See Comments)    hallucinations   Prednisolone Other (See Comments)   Penicillins Rash and Other (See Comments)    Has patient had a PCN reaction causing immediate rash, facial/tongue/throat swelling, SOB or lightheadedness with hypotension: No Has patient had a PCN reaction causing severe rash involving mucus membranes or skin necrosis: No Has patient had a PCN reaction that required hospitalization No Has patient had a PCN reaction occurring within the last 10 years: No If all of the above answers are "NO", then may proceed with Cephalosporin use.     Social History   Socioeconomic History   Marital status: Married    Spouse name: Not on file   Number of children: Not on file   Years of education: Not on file   Highest education level: Not on file  Occupational History   Not on file  Tobacco Use   Smoking status: Former   Smokeless tobacco: Never   Tobacco comments:    smoke in college- social  Vaping Use   Vaping Use: Never used  Substance and Sexual Activity   Alcohol use: Yes    Alcohol/week: 14.0 standard drinks    Types: 14 Cans of beer per week   Drug use: No   Sexual activity: Not on file  Other Topics Concern   Not on file  Social History Narrative   Lives in [place] wife.  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   Social Determinants of Health   Financial Resource Strain: Not on file  Food Insecurity: Not on file  Transportation Needs: Not on file  Physical Activity: Not on file  Stress: Not on file  Social Connections: Not on file  Intimate Partner Violence: Not on file    Family History  Problem Relation Age of Onset   Hypertension Father    Hypertension Mother    Stroke Mother    CVA Mother    Hypertension Brother    Hypercholesterolemia Brother    Atrial fibrillation Brother    Congestive Heart Failure Brother     ROS- All systems are  reviewed and negative except as per the HPI above  Physical Exam: There were no vitals filed for this visit.   GEN- The patient is well appearing, alert and oriented x 3 today.   Head- normocephalic, atraumatic Eyes-  Sclera clear, conjunctiva pink Ears- hearing intact Oropharynx- clear Neck- supple, no JVP Lymph- no cervical lymphadenopathy Lungs- Clear to ausculation bilaterally, normal work of breathing, few rales hears in lung bases Heart irregular rate and rhythm, no murmurs, rubs or gallops, PMI not laterally displaced GI- soft, NT, ND, + BS Extremities- no clubbing, cyanosis, or edema MS- no significant deformity or atrophy Skin- no rash or lesion Psych- euthymic mood, full affect Neuro- strength and sensation are intact  EKG- Sinus brady 88 bpm, pr int 174 ms, qrs int 96 ms, qtc 429 ms  Epic records reviewed    Assessment and Plan: 1.  Paroxymal atrial flutter, s/p afib ablation 11/30/15 and 12/19/16 Successful cardioversion 11/19/17 Found to be in afib in March  prior to neck  surgery in the remote past  Dr. Rayann Heman scheduled  appointment 4 weeks after his surgery  to discuss cardioversion at which time he was  unaware of his rhythm, rate controlled  Had a difficult post op course and did not want any other procedures done until he  can reagin his strength from  his surgery  He has since had prostate surgery in May 2022 He returned to [place] spontaneously   He has now returned to rate control afib He is thinking he will go with rate control strategy for now  I will update echo to check his EF  Continue eliquis for chadsvasc score of 2 Continue metoprolol 50 mg bid  2. HTN Stable   I will see back in 6 months    Butch Penny C. Montrelle Eddings, [facility] [address] [place], [place] [address] [phone]

## 2021-04-21 ENCOUNTER — Other Ambulatory Visit: Payer: Self-pay

## 2021-04-21 MED ORDER — METOPROLOL TARTRATE 50 MG PO TABS: ORAL_TABLET | ORAL | 3 refills | Status: DC

## 2021-04-21 NOTE — Telephone Encounter (Signed)
This is a A-Fib clinic pt 

## 2021-04-28 DIAGNOSIS — E7800 Pure hypercholesterolemia, unspecified: Secondary | ICD-10-CM | POA: Diagnosis not present

## 2021-04-28 DIAGNOSIS — I10 Essential (primary) hypertension: Secondary | ICD-10-CM | POA: Diagnosis not present

## 2021-04-28 DIAGNOSIS — D696 Thrombocytopenia, unspecified: Secondary | ICD-10-CM | POA: Diagnosis not present

## 2021-04-28 DIAGNOSIS — Z1159 Encounter for screening for other viral diseases: Secondary | ICD-10-CM | POA: Diagnosis not present

## 2021-04-28 DIAGNOSIS — I4891 Unspecified atrial fibrillation: Secondary | ICD-10-CM | POA: Diagnosis not present

## 2021-04-28 DIAGNOSIS — Z0000 Encounter for general adult medical examination without abnormal findings: Secondary | ICD-10-CM | POA: Diagnosis not present

## 2021-04-28 DIAGNOSIS — R339 Retention of urine, unspecified: Secondary | ICD-10-CM | POA: Diagnosis not present

## 2021-04-28 DIAGNOSIS — E538 Deficiency of other specified B group vitamins: Secondary | ICD-10-CM | POA: Diagnosis not present

## 2021-04-28 DIAGNOSIS — G252 Other specified forms of tremor: Secondary | ICD-10-CM | POA: Diagnosis not present

## 2021-04-28 DIAGNOSIS — D6869 Other thrombophilia: Secondary | ICD-10-CM | POA: Diagnosis not present

## 2021-05-02 ENCOUNTER — Ambulatory Visit (HOSPITAL_COMMUNITY)
Admission: RE | Admit: 2021-05-02 | Discharge: 2021-05-02 | Disposition: A | Discharge status: Home / Self Care | Payer: Medicare PPO | Source: Ambulatory Visit | Attending: Family Medicine | Admitting: Family Medicine

## 2021-05-02 ENCOUNTER — Other Ambulatory Visit: Payer: Self-pay

## 2021-05-02 DIAGNOSIS — Z1211 Encounter for screening for malignant neoplasm of colon: Secondary | ICD-10-CM | POA: Diagnosis not present

## 2021-05-02 DIAGNOSIS — I4819 Other persistent atrial fibrillation: Secondary | ICD-10-CM | POA: Diagnosis not present

## 2021-05-02 DIAGNOSIS — I10 Essential (primary) hypertension: Secondary | ICD-10-CM | POA: Diagnosis not present

## 2021-05-02 LAB — ECHOCARDIOGRAM COMPLETE
AR max vel: 2.78 cm2
AV Peak grad: 5.1 mmHg
Ao pk vel: 1.13 m/s
Area-P 1/2: 4.57 cm2
S' Lateral: 3.1 cm

## 2021-05-03 ENCOUNTER — Other Ambulatory Visit: Payer: Self-pay | Admitting: Family Medicine

## 2021-05-03 DIAGNOSIS — Z136 Encounter for screening for cardiovascular disorders: Secondary | ICD-10-CM

## 2021-05-08 ENCOUNTER — Other Ambulatory Visit: Payer: Self-pay | Admitting: Internal Medicine

## 2021-05-13 ENCOUNTER — Other Ambulatory Visit (HOSPITAL_COMMUNITY): Payer: Self-pay | Admitting: *Deleted

## 2021-05-13 ENCOUNTER — Encounter (HOSPITAL_COMMUNITY): Payer: Self-pay | Admitting: *Deleted

## 2021-05-13 DIAGNOSIS — I4819 Other persistent atrial fibrillation: Secondary | ICD-10-CM

## 2021-06-20 ENCOUNTER — Ambulatory Visit: Payer: Medicare PPO | Admitting: Neurology

## 2021-07-25 ENCOUNTER — Ambulatory Visit
Admission: RE | Admit: 2021-07-25 | Discharge: 2021-07-25 | Disposition: A | Discharge status: Home / Self Care | Payer: Medicare PPO | Source: Ambulatory Visit | Attending: Family Medicine | Admitting: Family Medicine

## 2021-07-25 DIAGNOSIS — Z136 Encounter for screening for cardiovascular disorders: Secondary | ICD-10-CM

## 2021-08-29 DIAGNOSIS — C61 Malignant neoplasm of prostate: Secondary | ICD-10-CM | POA: Diagnosis not present

## 2021-09-05 DIAGNOSIS — C61 Malignant neoplasm of prostate: Secondary | ICD-10-CM | POA: Diagnosis not present

## 2021-09-05 DIAGNOSIS — N400 Enlarged prostate without lower urinary tract symptoms: Secondary | ICD-10-CM | POA: Diagnosis not present

## 2021-11-01 ENCOUNTER — Encounter (HOSPITAL_COMMUNITY): Payer: Self-pay | Admitting: Nurse Practitioner

## 2021-11-01 ENCOUNTER — Ambulatory Visit (HOSPITAL_COMMUNITY)
Admission: RE | Admit: 2021-11-01 | Discharge: 2021-11-01 | Disposition: A | Discharge status: Home / Self Care | Payer: Medicare PPO | Source: Ambulatory Visit | Attending: Nurse Practitioner | Admitting: Nurse Practitioner

## 2021-11-01 VITALS — BP 128/88 | HR 76 | Ht 71.0 in | Wt 216.2 lb

## 2021-11-01 DIAGNOSIS — I4892 Unspecified atrial flutter: Secondary | ICD-10-CM | POA: Diagnosis not present

## 2021-11-01 DIAGNOSIS — I110 Hypertensive heart disease with heart failure: Secondary | ICD-10-CM | POA: Diagnosis not present

## 2021-11-01 DIAGNOSIS — I4891 Unspecified atrial fibrillation: Secondary | ICD-10-CM | POA: Diagnosis not present

## 2021-11-01 DIAGNOSIS — I509 Heart failure, unspecified: Secondary | ICD-10-CM | POA: Diagnosis not present

## 2021-11-01 DIAGNOSIS — D6869 Other thrombophilia: Secondary | ICD-10-CM | POA: Diagnosis not present

## 2021-11-01 DIAGNOSIS — I4819 Other persistent atrial fibrillation: Secondary | ICD-10-CM | POA: Diagnosis not present

## 2021-11-01 DIAGNOSIS — Z7901 Long term (current) use of anticoagulants: Secondary | ICD-10-CM | POA: Diagnosis not present

## 2021-11-01 MED ORDER — POTASSIUM CHLORIDE CRYS ER 20 MEQ PO TBCR: EXTENDED_RELEASE_TABLET | ORAL | Status: AC

## 2021-11-01 NOTE — Progress Notes (Signed)
Patient ID: Patrick Gray, male   DOB: October 03, 1955, 66 y.o.   MRN: [id number]     Primary Care Physician: [name], MD Referring Physician: Hakiem Malizia is a 66 y.o. male with a h/o afib 2015 found by primary. Was placed on flecainide 100 mg bid and has done well until seen 6/20 and was noted to be in aflutter with RVR.Because of breakthrough afib, he was scheduled for ablation 11/30/15.  F/u in afib clinic 3 13. He was pending shoulder surgery   and has been in afib x 2 weeks. Flecainide was decreased to 50 mg bid, as he was told he could try to wean off after cardioversion. and within several days he returned to afib.   After shoulder surgery, he did have afib for a couple of weeks and did return to [place] on his own.  He is in the afib clinic 11/15/16. He has returned to afib since the beginning of September. He had been trying to wean of flecainide  again. He has has some tremors and when he tries to wean off drug, the tremors get better. He is toying with the idea of having  another ablation vrs a cardioversion but has guests this week into next week and will wait until then to make the decision. He is tolerating well and is rate controlled. No missed doses of eliquis. He wishes to pursue another ablation.  F/u in fib clinic after 2nd ablation 12/19/16. He felt well until yesterday PM when he noted some wheezing, shortness of breath and could not lie flat last night to sleep. HR has been regular. No fever, chills, chest pain, cough. His weight is noted to be up 10 lbs from my last clinic visit around one month ago. He feels bloated in his abdomen.  F/u in afib clinic 12/26/16. He was started on lasix on last visit for fluid overload following ablation. He felt well after the first dose of lasix and by Saturday night( 2 doses), he was able to lie flat to sleep. He did not take lasix this am weight is down 8 lbs. Maintaining SR.  Pt is here for one month f/u ablation. He has not noted  any further afib. Weight is stable.Compliant with eliquis without any missed doses.  Pt being seen in clinic after calling to office, being out of rhythm x 2 weeks. He feels the trigger was waiting almost 2 weeks for test results for his wife, who has a h/o cancer. She got better news  than was expected but she is still pending surgery in the near future. Pt had stopped flecainide for worsening tremor of left arm, improved off flecainide. He took two doses of flecainide when he got out of rhythm and tremor worsened, so he says that he will no longer try flecainide. He states that he has never had tremor evaluated by neurologist, has mentioned to PCP.  F/u in afib clinic, 4/16. S/p successful cardioversion. He feels well. EKG shows SR.   F/u in afib clinic 9/16. He called to the clinic last week and asked to  increase his metoprolol as his HR's were elevated. His EKG shows atrial flutter, rate controlled. He has had the stress of his wife recently undergoing breast CA surgery x 3, his mother recently died and his adult Autistic daughter has been having some issues.  F/u in clinic 12/03/17, he had successful cardioversion and is still maintaining SR.   F/u in afib clinic, 05/31/20.  I have not seen pt for several years. He was seen by Dr. Rayann Heman in early March for pre op clearance for neck injury after a fall. He was found to be in afib at that time. He was given this appointment to see about scheduling DCCV 4 weeks after surgery if he remained out of rhythm. 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    He wife reports that he had a very difficult post op course complicated by post op delirium with aggressive behavior requiring restraints  and neurogenic bladder from  which he is still wearing a catheter. He is very weak and is supposed to  start physical therapy soon. He is rate controlled and does not seem to be bothered  with his rhythm at this time. He does not wish to have any other procedures until he is back to his baseline from surgery.   F/u in afib clinic, 09/30/20. He had  BPH with LUTS in May and bx came back normal. He is in SR today and for the most part has felt very well since the surgery. There was a question of CV if afib persisted but it appears he is in SR most of the time now. He is getting ready to fly to [place] for his daughter's wedding. He has a fear of flying and we discussed possibly afib could be induced by anxiety or higher elevation and to make sure he takes his medicine. He  lost 28 lbs since the the 2 surgeries and is going to the gym to maintain.   F/u in afib clinic, 04/14/21. He feels a few weeks ago he returned to afib, rate controlled. He felt fatigued for the first few days but now states that he is not bothered with it. We discussed options to restore SR but for now he wants a rate control strategy.   F/u in afib clinic 11/01/21. He remains in rate controlled afib and wants to continue with rate control as his long term goal. He states that he is not symptomatic.   Today, he denies symptoms of palpitations, chest pain, shortness of breath, orthopnea, PND, lower extremity edema, dizziness, presyncope, syncope, or neurologic sequela. Positive for fatigue in afib , hand tremor. The patient is tolerating medications without difficulties and is otherwise without complaint today.   Past Medical History:  Diagnosis Date   Acoustic neuroma ([place])    left   Anxiety    Arthritis    Complication of anesthesia    Delusion, violent, hallucination, hospitalized 8 days, urine retention   Deafness in left ear    Delusion ([place])    after fall on pain meds and prednisone   Dysrhythmia    PAF   Foley catheter in place    H/O cardiac radiofrequency ablation    HTN (hypertension)    Hypercholesterolemia    Hypopotassemia    Insomnia    Paroxysmal atrial fibrillation (HCC)    Radiculopathy     after fall   Tremor    left hand   Vitamin B 12 deficiency    Past Surgical History:  Procedure Laterality Date   ACOUSTIC NEUROMA RESECTION Left 19990   ANTERIOR CERVICAL DECOMP/DISCECTOMY FUSION N/A 04/26/2020   Procedure: Anterior Cervical Decompression Fusion - Cervical five-Cervical six - Cervical six-Cervical seven;  Surgeon: Kary Kos, MD;  Location: [facility];  Service: Neurosurgery;  Laterality: N/A;   APPENDECTOMY  1976   ATRIAL FIBRILLATION ABLATION  N/A 12/19/2016   Procedure: ATRIAL FIBRILLATION ABLATION;  Surgeon: Thompson Grayer, MD;  Location: [facility];  Service: Cardiovascular;  Laterality: N/A;   CARDIAC CATHETERIZATION N/A 10/07/2015   Procedure: Left Heart Cath and Coronary Angiography;  Surgeon: Josue Hector, MD;  Location: [facility];  Service: Cardiovascular;  Laterality: N/A;   CARDIOVERSION N/A 06/17/2013   Procedure: CARDIOVERSION;  Surgeon: Josue Hector, MD;  Location: [facility];  Service: Cardiovascular;  Laterality: N/A;   CARDIOVERSION N/A 05/11/2016   Procedure: CARDIOVERSION;  Surgeon: Sanda Klein, MD;  Location: [facility];  Service: Cardiovascular;  Laterality: N/A;   CARDIOVERSION N/A 05/24/2017   Procedure: CARDIOVERSION;  Surgeon: Sueanne Margarita, MD;  Location: [facility];  Service: Cardiovascular;  Laterality: N/A;   CARDIOVERSION N/A 11/19/2017   Procedure: CARDIOVERSION;  Surgeon: Pixie Casino, MD;  Location: [facility];  Service: Cardiovascular;  Laterality: N/A;   [place] N/A 11/30/2015   Procedure: Atrial Fibrillation Ablation;  Surgeon: Thompson Grayer, MD;  Location: [facility];  Service: Cardiovascular;  Laterality: N/A;   TEE WITHOUT CARDIOVERSION N/A 05/11/2016   Procedure: TRANSESOPHAGEAL ECHOCARDIOGRAM (TEE);  Surgeon: Sanda Klein, MD;  Location: [facility];  Service: Cardiovascular;  Laterality: N/A;   [place] Left 06/15/2016   Procedure: TOTAL SHOULDER ARTHROPLASTY;  Surgeon: Tania Ade, MD;  Location: [facility];  Service: Orthopedics;  Laterality: Left;  Left total shoulder arthroplasty   TRANSURETHRAL RESECTION OF PROSTATE N/A 07/02/2020   Procedure: TRANSURETHRAL RESECTION OF THE PROSTATE (TURP);  Surgeon: Lucas Mallow, MD;  Location: [facility];  Service: Urology;  Laterality: N/A;    Current Outpatient Medications  Medication Sig Dispense Refill   amLODipine (NORVASC) 10 MG tablet Take 10 mg by mouth daily.     atorvastatin (LIPITOR) 20 MG tablet Take 20 mg by mouth daily.     Cholecalciferol (VITAMIN D-3) 5000 units TABS Take 5,000 Units by mouth daily.     Coenzyme Q10 (COQ10) 100 MG CAPS Take 100 mg by mouth daily.     ELIQUIS 5 MG TABS tablet Take 1 tablet by mouth twice daily 180 tablet 3   metoprolol tartrate (LOPRESSOR) 50 MG tablet TAKE 1 TABLET BY MOUTH TWICE DAILY. MAY TAKE AN EXTRA 1/2 TABLET AS NEEDED FOR BREAKTHROUGH AFIB 200 tablet 3   Multiple Vitamin (MULTIVITAMIN) tablet Take 2 tablets by mouth daily.     olmesartan-hydrochlorothiazide (BENICAR HCT) 40-25 MG tablet Take 1 tablet by mouth daily.     QUERCETIN PO Take 1 capsule by mouth daily.     sildenafil (REVATIO) 20 MG tablet Take 20-100 mg by mouth daily as needed for erectile dysfunction.  0   traZODone (DESYREL) 50 MG tablet Take 50 mg by mouth at bedtime.     zinc gluconate 50 MG tablet Take 50 mg by mouth daily.     potassium chloride SA (KLOR-CON M) 20 MEQ tablet Take 2 tablet (40 meq) by mouth in the morning & take 1 tablet (20 meq) by mouth in the evening.     No current facility-administered medications for this encounter.    Allergies  Allergen Reactions   Hydrocodone Other (See Comments)   Other Other (See Comments)    opioids hallucination   Oxycodone Other (See Comments)    hallucinations   Prednisolone Other (See Comments)   Penicillins Rash and Other (See  Comments)    Has  patient had a PCN reaction causing immediate rash, facial/tongue/throat swelling, SOB or lightheadedness with hypotension: No Has patient had a PCN reaction causing severe rash involving mucus membranes or skin necrosis: No Has patient had a PCN reaction that required hospitalization No Has patient had a PCN reaction occurring within the last 10 years: No If all of the above answers are "NO", then may proceed with Cephalosporin use.     Social History   Socioeconomic History   Marital status: Married    Spouse name: Not on file   Number of children: Not on file   Years of education: Not on file   Highest education level: Not on file  Occupational History   Not on file  Tobacco Use   Smoking status: Former   Smokeless tobacco: Never   Tobacco comments:    smoke in college- social  Vaping Use   Vaping Use: Never used  Substance and Sexual Activity   Alcohol use: Yes    Alcohol/week: 14.0 standard drinks of alcohol    Types: 14 Cans of beer per week   Drug use: No   Sexual activity: Not on file  Other Topics Concern   Not on file  Social History Narrative   Lives in [place] wife.  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   Social Determinants of Health   Financial Resource Strain: Not on file  Food Insecurity: Not on file  Transportation Needs: Not on file  Physical Activity: Not on file  Stress: Not on file  Social Connections: Not on file  Intimate Partner Violence: Not on file    Family History  Problem Relation Age of Onset   Hypertension Father    Hypertension Mother    Stroke Mother    CVA Mother    Hypertension Brother    Hypercholesterolemia Brother    Atrial fibrillation Brother    Congestive Heart Failure Brother     ROS- All systems are reviewed and negative except as per the HPI above  Physical Exam: Vitals:   11/01/21 1039  BP: 128/88  Pulse: 76  Weight: 98.1 kg  Height: '5\' 11"'$  (1.803 m)     GEN- The patient is well appearing, alert  and oriented x 3 today.   Head- normocephalic, atraumatic Eyes-  Sclera clear, conjunctiva pink Ears- hearing intact Oropharynx- clear Neck- supple, no JVP Lymph- no cervical lymphadenopathy Lungs- Clear to ausculation bilaterally, normal work of breathing, few rales hears in lung bases Heart - irregular rate and rhythm, no murmurs, rubs or gallops, PMI not laterally displaced GI- soft, NT, ND, + BS Extremities- no clubbing, cyanosis, or edema MS- no significant deformity or atrophy Skin- no rash or lesion Psych- euthymic mood, full affect Neuro- strength and sensation are intact  EKG- Left ventricular ejection fraction, by estimation, is 55 to 60%. The left ventricle has normal function. The left ventricle has no regional wall motion abnormalities. There is mild left ventricular hypertrophy. Left ventricular diastolic parameters are indeterminate. 1. 2. Right ventricular systolic function is normal. The right ventricular size is normal. 3. The mitral valve is normal in structure. Trivial mitral valve regurgitation. The aortic valve was not well visualized. Aortic valve regurgitation is trivial. No aortic stenosis is present. 4. 5. Aortic dilatation noted. There is dilatation of the ascending aorta, measuring 40 mm.   ( PCP has since ordered Aorta U/S and showed very small aneurysm to be looked at again in 5 years)  The inferior  vena cava is normal in size with greater than 50% respiratory variability, suggesting right atrial pressure of 3 mmHg.    Assessment and Plan: 1. Persistent afib H/o  atrial flutter, s/p afib ablation 11/30/15 and 12/19/16 Successful cardioversion 11/19/17 Found to be in afib in March  prior to neck surgery in the remote past  Dr. Rayann Heman scheduled  appointment 4 weeks after his surgery  to discuss cardioversion at which time he was  unaware of his rhythm, rate controlled  Had a difficult post op course and did not want any other procedures done until he  can reagin his strength from  his surgery  He has since had prostate surgery in May 2022 He returned to [place] spontaneously  He has now returned to rate control afib  since last February   and wants a rate control strategy going forward    Continue eliquis for chadsvasc score of 2 Continue metoprolol 50 mg bid  2. HTN Stable   Return to afib clinic in 9 months    Butch Penny C. Makailey Hodgkin, [facility] [address] [place], [place] [address] [phone]

## 2021-11-03 DIAGNOSIS — E538 Deficiency of other specified B group vitamins: Secondary | ICD-10-CM | POA: Diagnosis not present

## 2021-11-03 DIAGNOSIS — C61 Malignant neoplasm of prostate: Secondary | ICD-10-CM | POA: Diagnosis not present

## 2021-11-03 DIAGNOSIS — D696 Thrombocytopenia, unspecified: Secondary | ICD-10-CM | POA: Diagnosis not present

## 2021-11-03 DIAGNOSIS — E7800 Pure hypercholesterolemia, unspecified: Secondary | ICD-10-CM | POA: Diagnosis not present

## 2021-11-03 DIAGNOSIS — G252 Other specified forms of tremor: Secondary | ICD-10-CM | POA: Diagnosis not present

## 2021-11-03 DIAGNOSIS — I4891 Unspecified atrial fibrillation: Secondary | ICD-10-CM | POA: Diagnosis not present

## 2021-11-03 DIAGNOSIS — R259 Unspecified abnormal involuntary movements: Secondary | ICD-10-CM | POA: Diagnosis not present

## 2021-11-03 DIAGNOSIS — D6869 Other thrombophilia: Secondary | ICD-10-CM | POA: Diagnosis not present

## 2021-11-03 DIAGNOSIS — I10 Essential (primary) hypertension: Secondary | ICD-10-CM | POA: Diagnosis not present

## 2021-11-08 ENCOUNTER — Encounter: Payer: Self-pay | Admitting: Neurology

## 2021-12-19 NOTE — Progress Notes (Unsigned)
Assessment/Plan:   ***2.  B12 deficiency  -pt with evidence of B12 deficiency, mild (379).  Discussed with the patient that neurologically, would like to see B12 levels greater than 400.  Would recommend oral B12 supplementation, 1000 mcg daily.  3.  History of cervical myelopathy from severe cervical stenosis at C5-C6  -Status post surgical intervention by Dr. Saintclair Halsted in March, 2022.  4.  History of vestibular schwannoma  -Status post surgical intervention/removal by Dr. Saintclair Halsted  Subjective:   Patrick Gray was seen today in the movement disorders clinic for neurologic consultation at the request of [name], MD.  The consultation is for the evaluation of rest tremor with parkinsonian features and to r/o Parkinsons Disease.  Outside records that were made available to me were reviewed.  In those record review was hospitalization records from March, 2022 where patient surgery for cervical myelopathy.  Patient saw neurology during that hospitalization for the tremor.  They noted left hand tremor.  They stated that the hand tremor improved at rest and was worsened with activity.  Preliminary diagnosis was essential tremor and it was recommended he follow-up with [facility] neurology.   Tremor: {yes no:314532}   How long has it been going on? ***  At rest or with activation?  ***  When is it noted the most?  ***  Fam hx of tremor?  {yes ZO:109604}  Located where?  ***  Affected by caffeine:  {yes no:314532}  Affected by alcohol:  {yes no:314532}  Affected by stress:  {yes no:314532}  Affected by fatigue:  {yes no:314532}  Spills soup if on spoon:  {yes no:314532}  Spills glass of liquid if full:  {yes no:314532}  Affects ADL's (tying shoes, brushing teeth, etc):  {yes no:314532}  Tremor inducing meds:  {yes no:314532}  Other Specific Symptoms:  Voice: *** Sleep: ***  Vivid Dreams:  {yes no:314532}  Acting out dreams:  {yes no:314532} Wet Pillows: {yes no:314532} Postural symptoms:   {yes no:314532}  Falls?  {yes no:314532} Bradykinesia symptoms: {parkinson brady:18041} Loss of smell:  {yes no:314532} Loss of taste:  {yes no:314532} Urinary Incontinence:  {yes no:314532} Difficulty Swallowing:  {yes no:314532} Handwriting, micrographia: {yes no:314532} Trouble with ADL's:  {yes no:314532}  Trouble buttoning clothing: {yes no:314532} Depression:  {yes no:314532} Memory changes:  {yes no:314532} Hallucinations:  {yes no:314532}  visual distortions: {yes no:314532} N/V:  {yes no:314532} Lightheaded:  {yes no:314532}  Syncope: {yes no:314532} Diplopia:  {yes no:314532} Dyskinesia:  {yes no:314532}  Neuroimaging of the brain has *** previously been performed.  It *** available for my review today.  MRI brain done in March, 2022 by Dr. Saintclair Halsted demonstrating 23 x 16 x 14 mm CP angle mass, consistent with vestibular schwannoma.  There was fairly significant movement artifact on the post gadolinium images.  There was mild to moderate small vessel disease.  I personally reviewed the images.  PREVIOUS MEDICATIONS: {Parkinson's RX:18200}  ALLERGIES:   Allergies  Allergen Reactions   Hydrocodone Other (See Comments)   Other Other (See Comments)    opioids hallucination   Oxycodone Other (See Comments)    hallucinations   Prednisolone Other (See Comments)   Penicillins Rash and Other (See Comments)    Has patient had a PCN reaction causing immediate rash, facial/tongue/throat swelling, SOB or lightheadedness with hypotension: No Has patient had a PCN reaction causing severe rash involving mucus membranes or skin necrosis: No Has patient had a PCN reaction that required hospitalization No Has patient had a PCN reaction  occurring within the last 10 years: No If all of the above answers are "NO", then may proceed with Cephalosporin use.     CURRENT MEDICATIONS:  Current Outpatient Medications  Medication Instructions   amLODipine (NORVASC) 10 mg, Oral, Daily    atorvastatin (LIPITOR) 20 mg, Oral, Daily   CoQ10 100 mg, Oral, Daily   ELIQUIS 5 MG TABS tablet Take 1 tablet by mouth twice daily   metoprolol tartrate (LOPRESSOR) 50 MG tablet TAKE 1 TABLET BY MOUTH TWICE DAILY. MAY TAKE AN EXTRA 1/2 TABLET AS NEEDED FOR BREAKTHROUGH AFIB   Multiple Vitamin (MULTIVITAMIN) tablet 2 tablets, Oral, Daily   olmesartan-hydrochlorothiazide (BENICAR HCT) 40-25 MG tablet 1 tablet, Oral, Daily   potassium chloride SA (KLOR-CON M) 20 MEQ tablet Take 2 tablet (40 meq) by mouth in the morning & take 1 tablet (20 meq) by mouth in the evening.   QUERCETIN PO 1 capsule, Oral, Daily   sildenafil (REVATIO) 20-100 mg, Oral, Daily PRN   traZODone (DESYREL) 50 mg, Oral, Daily at bedtime   Vitamin D-3 5,000 Units, Oral, Daily   zinc gluconate 50 mg, Oral, Daily    Objective:   PHYSICAL EXAMINATION:    VITALS:  There were no vitals filed for this visit.  GEN:  The patient appears stated age and is in NAD. HEENT:  Normocephalic, atraumatic.  The mucous membranes are moist. The superficial temporal arteries are without ropiness or tenderness. CV:  RRR Lungs:  CTAB Neck/HEME:  There are no carotid bruits bilaterally.  Neurological examination:  Orientation: The patient is alert and oriented x3.  Cranial nerves: There is good facial symmetry.  Extraocular muscles are intact. The visual fields are full to confrontational testing. The speech is fluent and clear. Soft palate rises symmetrically and there is no tongue deviation. Hearing is intact to conversational tone. Sensation: Sensation is intact to light touch throughout (facial, trunk, extremities). Vibration is intact at the bilateral big toe. There is no extinction with double simultaneous stimulation.  Motor: Strength is 5/5 in the bilateral upper and lower extremities.   Shoulder shrug is equal and symmetric.  There is no pronator drift. Deep tendon reflexes: Deep tendon reflexes are 2/4 at the bilateral biceps,  triceps, brachioradialis, patella and achilles. Plantar responses are downgoing bilaterally.  Movement examination: Tone: There is ***tone in the bilateral upper extremities.  The tone in the lower extremities is ***.  Abnormal movements: *** Coordination:  There is *** decremation with RAM's, *** Gait and Station: The patient has *** difficulty arising out of a deep-seated chair without the use of the hands. The patient's stride length is ***.  The patient has a *** pull test.     I have reviewed and interpreted the following labs independently   Chemistry      Component Value Date/Time   NA 140 07/02/2020 1232   NA 142 03/18/2019 1210   K 3.5 07/02/2020 1232   CL 106 07/02/2020 1232   CO2 27 07/02/2020 1232   BUN 18 07/02/2020 1232   BUN 16 03/18/2019 1210   CREATININE 0.65 07/02/2020 1232   CREATININE 0.87 11/15/2015 1009      Component Value Date/Time   CALCIUM 9.8 07/02/2020 1232   ALKPHOS 58 04/26/2020 0852   AST 71 (H) 04/26/2020 0852   ALT 84 (H) 04/26/2020 0852   BILITOT 1.5 (H) 04/26/2020 0852      Lab Results  Component Value Date   TSH 2.727 05/02/2020   Lab Results  Component  Value Date   WBC 4.5 07/02/2020   HGB 14.7 07/02/2020   HCT 41.4 07/02/2020   MCV 86.6 07/02/2020   PLT 150 07/02/2020      Total time spent on today's visit was ***greater than 60 minutes, including both face-to-face time and nonface-to-face time.  Time included that spent on review of records (prior notes available to me/labs/imaging if pertinent), discussing treatment and goals, answering patient's questions and coordinating care.  Cc:  [name], MD

## 2021-12-21 ENCOUNTER — Ambulatory Visit: Payer: Medicare PPO | Admitting: Neurology

## 2021-12-21 ENCOUNTER — Encounter: Payer: Self-pay | Admitting: Neurology

## 2021-12-21 VITALS — BP 122/82 | HR 78 | Ht 71.0 in | Wt 218.8 lb

## 2021-12-21 DIAGNOSIS — Z789 Other specified health status: Secondary | ICD-10-CM | POA: Diagnosis not present

## 2021-12-21 DIAGNOSIS — G20A1 Parkinson's disease without dyskinesia, without mention of fluctuations: Secondary | ICD-10-CM | POA: Insufficient documentation

## 2021-12-21 DIAGNOSIS — E538 Deficiency of other specified B group vitamins: Secondary | ICD-10-CM | POA: Diagnosis not present

## 2021-12-21 MED ORDER — CARBIDOPA-LEVODOPA 25-100 MG PO TABS: 1.0000 | ORAL_TABLET | Freq: Three times a day (TID) | ORAL | 1 refills | Status: DC

## 2021-12-21 NOTE — Patient Instructions (Signed)
Start Carbidopa Levodopa as follows: Take 1/2 tablet three times daily, at least 30 minutes before meals (approximately 6am/11am/4pm), for one week Then take 1/2 tablet in the morning, 1/2 tablet in the afternoon, 1 tablet in the evening, at least 30 minutes before meals, for one week Then take 1/2 tablet in the morning, 1 tablet in the afternoon, 1 tablet in the evening, at least 30 minutes before meals, for one week Then take 1 tablet three times daily at 6am/11am/4pm at least 30 minutes before meals   As a reminder, carbidopa/levodopa can be taken at the same time as a carbohydrate, but we like to have you take your pill either 30 minutes before a protein source or 1 hour after as protein can interfere with carbidopa/levodopa absorption.  Decrease alcohol to no more than 2 days per week  Let me know if you change your mind and decide to do Physical therapy.  You have been diagnosed with low B12.  We recommend that you take over the counter B12, 1000 micrograms daily.  Local and Online Resources for Power over Parkinson's Group  October 2023    LOCAL [place] PARKINSON'S GROUPS   Power over Parkinson's Group:    Power Over Parkinson's Patient Education Group will be Wednesday, October 11th-*Hybrid meting*- in person at [place] [facility] location and via WEBEX at 2 pm.   Upcoming Power over Parkinson's Meetings:  2nd Wednesdays of the month at 2 pm:   October 11th, November 8th, December 13th  Contact Amy Marriott at [email or web address] if interested in participating in this group    LOCAL EVENTS AND NEW OFFERINGS  New PWR! Moves Community Fitness Instructor-Led Classes offering at [employer]!  TUESDAYS and Wednesdays 1-2 pm.   Contact Christy Weaver at  [email or web address] or Mike Sabin at [employer], [email or web address]  Dance for Parkinson 's classes will be on Tuesdays 9:30am-10:30am starting October 3-December 12 with a break the week of November  21st. Located in the [place] [facility] which is in the first floor of the [facility] ([address] To register:  [email or web address] or [phone]  Drumming for Parkinson's will be held on 2nd and 4th Mondays at 11:00 am.   Located at the [employer] ([address] [place]  Contact Jane Maydian at [email or web address] or [phone]  Through support from the Parkinson's Foundation, the Dance and Drumming for Parkinson's classes are free for both patients and caregivers.    [employer] Parkinson's Tai Chi Class, Mondays at 11 am.  Call [phone] for details   ONLINE EDUCATION AND SUPPORT  Parkinson Foundation:  www.parkinson.org  PD Health at Home continues:  Mindfulness Mondays, Wellness Wednesdays, Fitness Fridays   Upcoming Education:    Parkinson's 101:  What you and your family should know.  Wednesday, Oct. 4th 1-2 pm  Expert Briefing:     Parkinson's and the Gut-Brain Connection.  Wednesday, Oct. 11th 1-2 pm  Hallucinations and Delusions in Parkinson's.  Wednesday, Nov. 8th, 1-2 pm  Register for expert briefings (webinars) at [email or web address]://www.[email or web address].org/resources-support/online-education/expert-[email or web address]-webinars  Please check out their website to sign up for emails and see their full online offerings      [employer] Foundation:  www.michaeljfox.org   Third Thursday Webinars:  On the third Thursday of every month at 12 p.m. ET, join our free live webinars to learn about various aspects of living with Parkinson's disease and our work to speed medical breakthroughs.  Upcoming Webinar:  Estate Planning Tips for Increasing   your Financial Wellness. (Replay).  Thursday, Oct. 12th at 12 noon  Check out additional information on their website to see their full online offerings    [place]:  www.[email or web address]  Upcoming Webinar:   Stay tuned  Webinar Series:  Living with Parkinson's  Meetup.   Third Thursdays each month, 3 pm  Care Partner Monthly Meetup.  With Robin Searing Phinney.  First Tuesday of each month, 2 pm  Check out additional information to Live Well Today on their website    Parkinson and Movement Disorders (PMD) Alliance:  www.pmdalliance.org  [employer] Online:  Online Education Events  Sign up for emails, which are sent weekly to give you updates on programming and online offerings    Parkinson's Association of the [place]:  www.parkinsonassociation.org  Information on online support groups, education events, and online exercises including Yoga, Parkinson's exercises and more-LOTS of information on links to PD resources and online events  Virtual Support Group through Parkinson's Association of the [employer]; next one is scheduled for Wednesday, October 4th at 2 pm.  (These are typically scheduled for the 1st Wednesday of the month at 2 pm).  Visit website for details.   MOVEMENT AND EXERCISE OPPORTUNITIES  PWR! Moves Classes at [place].  Wednesdays 10 and 11 am.   Contact Amy Marriott, PT [email or web address] if interested.  NEW PWR! Moves Class offerings at [employer].  *TUESDAYS* and Wednesdays 1-2 pm.  Contact Vonna Kotyk at  [email or web address] or Caron Presume at [place],  [email or web address]  Parkinson's Wellness Recovery (PWR! Moves)  www.[email or web address]  Info on the PWR! Virtual Experience:  You will have access to our expertise?through self-assessment, guided plans that start with the PD-specific fundamentals, educational content, tips, Q&A with an expert, and a growing Art therapist of PD-specific pre-recorded and live exercise classes of varying types and intensity - both physical and cognitive! If that is not enough, we offer 1:1 wellness consultations (in-person or virtual) to personalize your PWR! Research scientist (medical).   [place] Fridays:   As part of the PD Health @ Home program,  this free video series focuses each week on one aspect of fitness designed to support people living with Parkinson's.? These weekly videos highlight the [place] fitness guidelines for people with Parkinson's disease.  ModemG[employer].si  Dance for PD website is offering free, live-stream classes throughout the week, as well as links to [employer] of classes:  [email or web address].org/  Virtual dance and Pilates for Parkinson's classes: Click on the Community Tab> Parkinson's Movement Initiative Tab.  To register for classes and for more information, visit [email or web address].za and click the "community" tab.   [employer] Parkinson's Cycling Classes   [employer]:  Thursdays @ Noon-Live classes at [employer] ([employer] [facility] [email or web address]?or [phone])  [employer]: Virtual Classes Mondays and Thursdays Jeanette Caprice classes Tuesday, Wednesday and Thursday (contact Cherry at [email or web address] ?or [phone])  [place]  Varied levels of classes are offered Tuesdays and Thursdays at [employer].   Stretching with Verdis Frederickson weekly class is also offered for people with Parkinson's  To observe a class or for more information, call [phone] or email Hezzie Bump at [email or web address]   ADDITIONAL SUPPORT AND RESOURCES  Well[employer]Spring [employer]:Online Caregiver Education Opportunities:  www.well-springsolutions.org/caregiver-education/caregiver-support-group.  You may also contact Vickki Muff at [email or web address] or ([phone].     Well-Spring Navigator:  Just1Navigator program, a?free service to help individuals and families through the journey of determining care for older adults.  The "Navigator" is a Education officer, museum,  Nicole Reynolds, who will speak with a prospective client and/or loved ones to provide an assessment of the situation and a set of  recommendations for a personalized care plan -- all free of charge, and whether?Well-Spring [employer] offers the needed service or not. If the need is not a service we provide, we are well-connected with reputable programs in town that we can refer you to.  www.well-springsolutions.org or to speak with the Navigator, call [phone].       

## 2022-01-09 ENCOUNTER — Encounter: Payer: Self-pay | Admitting: Neurology

## 2022-01-10 ENCOUNTER — Other Ambulatory Visit: Payer: Self-pay

## 2022-01-10 DIAGNOSIS — G20A1 Parkinson's disease without dyskinesia, without mention of fluctuations: Secondary | ICD-10-CM

## 2022-01-23 NOTE — Therapy (Signed)
OUTPATIENT PHYSICAL THERAPY NEURO EVALUATION   Patient Name: Patrick Gray MRN: [id number] DOB:11-18-1955, 66 y.o., male Today's Date: 01/24/2022   PCP: [name], MD  REFERRING PROVIDER: Vladimir Faster, DO  END OF SESSION:  PT End of Session - 01/24/22 1608     Visit Number 1    Number of Visits 10    Date for PT Re-Evaluation 03/07/22    Authorization Type [employer] Medicare    PT Start Time 1510    PT Stop Time 1600    PT Time Calculation (min) 50 min    Equipment Utilized During Treatment Gait belt    Activity Tolerance Patient tolerated treatment well    Behavior During Therapy WFL for tasks assessed/performed             Past Medical History:  Diagnosis Date   Acoustic neuroma (HCC)    left   Anxiety    Arthritis    Complication of anesthesia    Delusion, violent, hallucination, hospitalized 8 days, urine retention   Deafness in left ear    Delusion (HCC)    after fall on pain meds and prednisone   Dysrhythmia    PAF   Foley catheter in place    H/O cardiac radiofrequency ablation    HTN (hypertension)    Hypercholesterolemia    Hypopotassemia    Insomnia    Paroxysmal atrial fibrillation (HCC)    Radiculopathy    after fall   Tremor    left hand   Vitamin B 12 deficiency    Past Surgical History:  Procedure Laterality Date   ACOUSTIC NEUROMA RESECTION Left 19990   ANTERIOR CERVICAL DECOMP/DISCECTOMY FUSION N/A 04/26/2020   Procedure: Anterior Cervical Decompression Fusion - Cervical five-Cervical six - Cervical six-Cervical seven;  Surgeon: Donalee Citrin, MD;  Location: [facility] OR;  Service: Neurosurgery;  Laterality: N/A;   APPENDECTOMY  1976   ATRIAL FIBRILLATION ABLATION N/A 12/19/2016   Procedure: ATRIAL FIBRILLATION ABLATION;  Surgeon: Hillis Range, MD;  Location: [facility] INVASIVE CV LAB;  Service: Cardiovascular;  Laterality: N/A;   CARDIAC CATHETERIZATION N/A 10/07/2015   Procedure: Left Heart Cath and Coronary Angiography;  Surgeon: Wendall Stade,  MD;  Location: [facility] INVASIVE CV LAB;  Service: Cardiovascular;  Laterality: N/A;   CARDIOVERSION N/A 06/17/2013   Procedure: CARDIOVERSION;  Surgeon: Wendall Stade, MD;  Location: [facility] ENDOSCOPY;  Service: Cardiovascular;  Laterality: N/A;   CARDIOVERSION N/A 05/11/2016   Procedure: CARDIOVERSION;  Surgeon: Thurmon Fair, MD;  Location: [facility] ENDOSCOPY;  Service: Cardiovascular;  Laterality: N/A;   CARDIOVERSION N/A 05/24/2017   Procedure: CARDIOVERSION;  Surgeon: Quintella Reichert, MD;  Location: [facility] ENDOSCOPY;  Service: Cardiovascular;  Laterality: N/A;   CARDIOVERSION N/A 11/19/2017   Procedure: CARDIOVERSION;  Surgeon: Chrystie Nose, MD;  Location: [facility] ENDOSCOPY;  Service: Cardiovascular;  Laterality: N/A;   CHOLECYSTECTOMY  1976   CLUB FOOT RELEASE  1968   ELECTROPHYSIOLOGIC STUDY N/A 11/30/2015   Procedure: Atrial Fibrillation Ablation;  Surgeon: Hillis Range, MD;  Location: [facility] INVASIVE CV LAB;  Service: Cardiovascular;  Laterality: N/A;   TEE WITHOUT CARDIOVERSION N/A 05/11/2016   Procedure: TRANSESOPHAGEAL ECHOCARDIOGRAM (TEE);  Surgeon: Thurmon Fair, MD;  Location: [facility] ENDOSCOPY;  Service: Cardiovascular;  Laterality: N/A;   TONSILLECTOMY  1967   TOTAL SHOULDER ARTHROPLASTY Left 06/15/2016   Procedure: TOTAL SHOULDER ARTHROPLASTY;  Surgeon: Jones Broom, MD;  Location: [facility] OR;  Service: Orthopedics;  Laterality: Left;  Left total shoulder arthroplasty   TRANSURETHRAL RESECTION OF  PROSTATE N/A 07/02/2020   Procedure: TRANSURETHRAL RESECTION OF THE PROSTATE (TURP);  Surgeon: Crista Elliot, MD;  Location: [facility] ORS;  Service: Urology;  Laterality: N/A;   Patient Active Problem List   Diagnosis Date Noted   Parkinson's disease without dyskinesia or fluctuating manifestations 12/21/2021   BPH (benign prostatic hyperplasia) 07/02/2020   Myelopathy (HCC) 04/26/2020   Atrial flutter (HCC) 12/19/2016   S/P shoulder replacement, left 06/15/2016   A-fib (HCC) 11/30/2015   HTN (hypertension)     Hypercholesterolemia    Deafness in left ear    Atrial fibrillation (HCC)    Hypopotassemia     ONSET DATE: ~7 years  REFERRING DIAG: G20.A1 (ICD-10-CM) - Parkinson's disease without dyskinesia or fluctuating manifestations   THERAPY DIAG:  Unsteadiness on feet  Muscle weakness (generalized)  Difficulty in walking, not elsewhere classified  Other symptoms and signs involving the nervous system  Rationale for Evaluation and Treatment: Rehabilitation  SUBJECTIVE:                                                                                                                                                                                             SUBJECTIVE STATEMENT: Patient reports that he has had neurological problems for a while- brain surgery for acoustic neuroma 3 years ago. Diagnosed with PD in November. L hand tremor which is his dominant hand. No sx on the R side. Wife reports concerns about L hand being tight and trouble with writing. Last fall occurred 1 year ago when he fell into a wheelbarrow hitting his face. Reports gait is shortened and shuffling; maybe slightly better since starting meds. Using a SPC when he wakes up at night, otherwise not using AD. Denies festination/freezing.    Pt accompanied by: significant other  PERTINENT HISTORY: L acoustic neuroma s/p resection 2022, L ear deafness, HTN, HLD, a-fib, cervical fusion 2022, L TSA 2018  PAIN:  Are you having pain? Yes: NPRS scale: 3-4/10 Pain location: L shoulder Pain description: aching Aggravating factors: sleeping on it Relieving factors: ibuprofen   PRECAUTIONS: Other: L hearing loss  WEIGHT BEARING RESTRICTIONS: No  FALLS: Has patient fallen in last 6 months? No  LIVING ENVIRONMENT: Lives with: lives with their spouse Lives in: House/apartment Stairs:  4-5 steps to enter with B sides; 1 story home Has following equipment at home: Single point cane  PLOF: Independent and Leisure: playing  music  PATIENT GOALS: work on balance   OBJECTIVE:   DIAGNOSTIC FINDINGS: none recent  COGNITION: Overall cognitive status: Within functional limits for tasks assessed   SENSATION: WFL to light touch  COORDINATION: Alternating pronation/supination:  movements becoming smaller with each movement Alternating toe tap: movements becoming smaller with each movement Finger to nose: intact B  L hand tremor   MUSCLE TONE: WNL B    POSTURE: rounded shoulders, forward head, and increased thoracic kyphosis  LOWER EXTREMITY ROM:     Active  Right Eval Left Eval  Hip flexion    Hip extension    Hip abduction    Hip adduction    Hip internal rotation    Hip external rotation    Knee flexion    Knee extension    Ankle dorsiflexion 12 *compensates with knee 15  Ankle plantarflexion    Ankle inversion    Ankle eversion     (Blank rows = not tested)  LOWER EXTREMITY MMT:    MMT (in sitting except PF) Right Eval Left Eval  Hip flexion 4 4+  Hip extension    Hip abduction 4+ 4  Hip adduction 4+ 4  Hip internal rotation    Hip external rotation    Knee flexion 4 4  Knee extension 5 4  Ankle dorsiflexion 4 4+  Ankle plantarflexion 3+ 3+  Ankle inversion    Ankle eversion    (Blank rows = not tested)   GAIT: Gait pattern:  poor foot clearance and reduced B step length with L foot occasionally catching on floor  Assistive device utilized: None Level of assistance: Complete Independence   FUNCTIONAL TESTS:   [facility] PT Assessment - 01/24/22 0001       Standardized Balance Assessment   Standardized Balance Assessment Five Times Sit to Stand;Timed Up and Go Test;Mini-BESTest    Five times sit to stand comments  9.43 sec without UEs   limited eccentric control     Mini-BESTest   Sit To Stand Normal: Comes to stand without use of hands and stabilizes independently.    Rise to Toes Normal: Stable for 3 s with maximum height.    Stand on one leg (left) Moderate: < 20 s     Stand on one leg (right) Normal: 20 s.    Stand on one leg - lowest score 1    Compensatory Stepping Correction - Forward Normal: Recovers independently with a single, large step (second realignement is allowed).    Compensatory Stepping Correction - Backward Moderate: More than one step is required to recover equilibrium    Compensatory Stepping Correction - Left Lateral Normal: Recovers independently with 1 step (crossover or lateral OK)    Compensatory Stepping Correction - Right Lateral Normal: Recovers independently with 1 step (crossover or lateral OK)    Stepping Corredtion Lateral - lowest score 2    Stance - Feet together, eyes open, firm surface  Normal: 30s    Stance - Feet together, eyes closed, foam surface  Moderate: < 30s    Incline - Eyes Closed Normal: Stands independently 30s and aligns with gravity    Change in Gait Speed Normal: Significantly changes walkling speed without imbalance    Walk with head turns - Horizontal Normal: performs head turns with no change in gait speed and good balance    Walk with pivot turns Moderate:Turns with feet close SLOW (>4 steps) with good balance.    Step over obstacles Normal: Able to step over box with minimal change of gait speed and with good balance.    Timed UP & GO with Dual Task Moderate: Dual Task affects either counting OR walking (>10%) when compared to the TUG without Dual Task.  Mini-BEST total score 23      Timed Up and Go Test   Normal TUG (seconds) 8.77    Manual TUG (seconds) 9.58    Cognitive TUG (seconds) 11.81    TUG Comments 9.2% increase in manual, 34.6% increase in cognitive                TODAY'S TREATMENT:                                                                                                                              DATE: 01/24/22    PATIENT EDUCATION: Education details: prognosis, POC, HEP; exam findings; benefits of OT- patient and wife want to pursue this  Person educated:  Patient and Spouse Education method: Explanation, Demonstration, Tactile cues, Verbal cues, and Handouts Education comprehension: verbalized understanding and returned demonstration  HOME EXERCISE PROGRAM: Access Code: [email or web address] URL: [email or web address] Date: 01/24/2022 Prepared by: [facility] - Outpatient  Rehab - [facility]  Exercises - Single Leg Stance with Support  - 1 x daily - 5 x weekly - 2-3 sets - 30 sec hold - Standing Hip Abduction with Resistance at Ankles and Counter Support  - 1 x daily - 5 x weekly - 2 sets - 10 reps - Supine Bridge with Mini Swiss Ball Between Knees  - 1 x daily - 5 x weekly - 2 sets - 10 reps - Squat with Chair Touch  - 1 x daily - 5 x weekly - 2 sets - 10 reps   GOALS: Goals reviewed with patient? Yes  SHORT TERM GOALS: Target date: 02/14/2022  Patient to be independent with initial HEP. Baseline: HEP initiated Goal status: INITIAL    LONG TERM GOALS: Target date: 03/07/2022  Patient to be independent with advanced HEP. Baseline: Not yet initiated  Goal status: INITIAL  Patient to verbalize understanding of local Parkinson's Disease community resources including community fitness post D/C.   Baseline: Not yet initiated  Goal status: INITIAL  Patient to improve MiniBestTest score to at least 25 to decrease risk of falls.  Baseline: 23 Goal status: INITIAL  Patient to demonstrate <10% increase between TUG and TUG cognitive/manual to improve ability to dual task in home/community.  Baseline: 9.2% increase in manual, 34.6% increase in cognitive  Goal status: INITIAL  Patient to verbalize understanding of fall prevention in home environment information. Baseline: Not yet initiated Goal status: INITIAL    ASSESSMENT:  CLINICAL IMPRESSION:  Patient is a 66 y/o M, newly diagnosed with Parkinson's Disease, presenting to OPPT with wife with c/o imbalance and changes in walking quality. Reports L hand tremor and  symptoms being more L sided. Also reports shortened/shuffling gait without freezing or festination. Wife reports concerns about the L hand and patient's ability to write, and agreeable to OT. Patient today presenting with discoordination and L hand tremor, rounded posture, B LE weakness, gait deviations, difficulty with dual tasking, and some imbalance. Patient was educated on  gentle balance and strengthening HEP and reported understanding. Would benefit from skilled PT services 2 x/week for 3 weeks followed by 1x/week for 3 weeks to address aforementioned impairments in order to optimize level of function.      OBJECTIVE IMPAIRMENTS: Abnormal gait, decreased balance, decreased coordination, decreased strength, impaired flexibility, improper body mechanics, and postural dysfunction.   ACTIVITY LIMITATIONS: carrying, lifting, bending, standing, squatting, stairs, bathing, toileting, dressing, hygiene/grooming, and caring for others  PARTICIPATION LIMITATIONS: meal prep, cleaning, laundry, shopping, community activity, yard work, and church  PERSONAL FACTORS: Age, Fitness, Past/current experiences, Time since onset of injury/illness/exacerbation, and 3+ comorbidities: L acoustic neuroma s/p resection 2022, L ear deafness, HTN, HLD, a-fib, cervical fusion 2022, L TSA 2018  are also affecting patient's functional outcome.   REHAB POTENTIAL: Good  CLINICAL DECISION MAKING: Evolving/moderate complexity  EVALUATION COMPLEXITY: Moderate  PLAN:  PT FREQUENCY:  2x/week for 3 weeks, then 1x/week for 3 weeks  PT DURATION: 6 weeks  PLANNED INTERVENTIONS: Therapeutic exercises, Therapeutic activity, Neuromuscular re-education, Balance training, Gait training, Patient/Family education, Self Care, Joint mobilization, Stair training, Vestibular training, Canalith repositioning, DME instructions, Aquatic Therapy, Dry Needling, Electrical stimulation, Cryotherapy, Moist heat, Taping, Manual therapy, and  Re-evaluation  PLAN FOR NEXT SESSION: reassess HEP; progress LE strengthening, high level balance- SLS, compliant surface with EC, initiate PD edu and PWR moves   Anette Guarneri, [place], DPT 01/24/22 5:26 PM   Outpatient Rehab at [facility] [address], [address] [place], [place] [address] Phone # [phone] Fax # [phone]

## 2022-01-24 ENCOUNTER — Ambulatory Visit: Payer: Medicare PPO | Attending: Neurology | Admitting: Physical Therapy

## 2022-01-24 ENCOUNTER — Other Ambulatory Visit: Payer: Self-pay

## 2022-01-24 ENCOUNTER — Encounter: Payer: Self-pay | Admitting: Physical Therapy

## 2022-01-24 ENCOUNTER — Telehealth: Payer: Self-pay | Admitting: Physical Therapy

## 2022-01-24 DIAGNOSIS — R278 Other lack of coordination: Secondary | ICD-10-CM | POA: Diagnosis not present

## 2022-01-24 DIAGNOSIS — R262 Difficulty in walking, not elsewhere classified: Secondary | ICD-10-CM | POA: Diagnosis not present

## 2022-01-24 DIAGNOSIS — R29818 Other symptoms and signs involving the nervous system: Secondary | ICD-10-CM | POA: Diagnosis not present

## 2022-01-24 DIAGNOSIS — G20A1 Parkinson's disease without dyskinesia, without mention of fluctuations: Secondary | ICD-10-CM | POA: Diagnosis not present

## 2022-01-24 DIAGNOSIS — R2681 Unsteadiness on feet: Secondary | ICD-10-CM | POA: Diagnosis not present

## 2022-01-24 DIAGNOSIS — M6281 Muscle weakness (generalized): Secondary | ICD-10-CM | POA: Insufficient documentation

## 2022-01-24 NOTE — Telephone Encounter (Signed)
Hi Dr. Carles Collet,  I evaluated Mr. Goertzen today in OPPT for treatment of Parkinson's Disease. The patient would benefit from OT evaluation for L hand tightness and difficulty with writing.    If you agree, please place an order in [facility] in [facility] or fax the order to [phone].    Thank you,  Janene Harvey, PT, DPT 01/24/22 5:28 PM  [facility] Outpatient Rehab at [facility] [address] [place], [place], [place] [address] Phone # [phone] Fax # [phone]

## 2022-01-25 NOTE — Addendum Note (Signed)
Addended by: Ludwig Clarks on: 01/25/2022 07:35 AM   Modules accepted: Orders

## 2022-01-26 ENCOUNTER — Ambulatory Visit: Payer: Medicare PPO | Admitting: Physical Therapy

## 2022-01-26 ENCOUNTER — Encounter: Payer: Self-pay | Admitting: Physical Therapy

## 2022-01-26 DIAGNOSIS — R262 Difficulty in walking, not elsewhere classified: Secondary | ICD-10-CM | POA: Diagnosis not present

## 2022-01-26 DIAGNOSIS — R278 Other lack of coordination: Secondary | ICD-10-CM | POA: Diagnosis not present

## 2022-01-26 DIAGNOSIS — R2681 Unsteadiness on feet: Secondary | ICD-10-CM | POA: Diagnosis not present

## 2022-01-26 DIAGNOSIS — M6281 Muscle weakness (generalized): Secondary | ICD-10-CM | POA: Diagnosis not present

## 2022-01-26 DIAGNOSIS — R29818 Other symptoms and signs involving the nervous system: Secondary | ICD-10-CM

## 2022-01-26 DIAGNOSIS — G20A1 Parkinson's disease without dyskinesia, without mention of fluctuations: Secondary | ICD-10-CM | POA: Diagnosis not present

## 2022-01-26 NOTE — Therapy (Signed)
OUTPATIENT PHYSICAL THERAPY NEURO TREATMENT NOTE   Patient Name: Patrick Gray MRN: [id number] DOB:January 22, 1956, 66 y.o., male Today's Date: 01/26/2022   PCP: [name], MD  REFERRING PROVIDER: Ludwig Clarks, DO  END OF SESSION:  PT End of Session - 01/26/22 1444     Visit Number 2    Number of Visits 10    Date for PT Re-Evaluation 03/07/22    Authorization Type [employer]    Authorization Time Period 01/24/2022-03/07/2022    Authorization - Visit Number 2    Authorization - Number of Visits 10    PT Start Time 3382    PT Stop Time 1530    PT Time Calculation (min) 43 min    Equipment Utilized During Treatment --    Activity Tolerance Patient tolerated treatment well    Behavior During Therapy WFL for tasks assessed/performed              Past Medical History:  Diagnosis Date   Acoustic neuroma ([place])    left   Anxiety    Arthritis    Complication of anesthesia    Delusion, violent, hallucination, hospitalized 8 days, urine retention   Deafness in left ear    Delusion ([place])    after fall on pain meds and prednisone   Dysrhythmia    PAF   Foley catheter in place    H/O cardiac radiofrequency ablation    HTN (hypertension)    Hypercholesterolemia    Hypopotassemia    Insomnia    Paroxysmal atrial fibrillation ([place])    Radiculopathy    after fall   Tremor    left hand   Vitamin B 12 deficiency    Past Surgical History:  Procedure Laterality Date   ACOUSTIC NEUROMA RESECTION Left 19990   ANTERIOR CERVICAL DECOMP/DISCECTOMY FUSION N/A 04/26/2020   Procedure: Anterior Cervical Decompression Fusion - Cervical five-Cervical six - Cervical six-Cervical seven;  Surgeon: Kary Kos, MD;  Location: [facility];  Service: Neurosurgery;  Laterality: N/A;   APPENDECTOMY  1976   ATRIAL FIBRILLATION ABLATION N/A 12/19/2016   Procedure: ATRIAL FIBRILLATION ABLATION;  Surgeon: Thompson Grayer, MD;  Location: [facility];  Service: Cardiovascular;  Laterality: N/A;    CARDIAC CATHETERIZATION N/A 10/07/2015   Procedure: Left Heart Cath and Coronary Angiography;  Surgeon: Josue Hector, MD;  Location: [facility];  Service: Cardiovascular;  Laterality: N/A;   CARDIOVERSION N/A 06/17/2013   Procedure: CARDIOVERSION;  Surgeon: Josue Hector, MD;  Location: [facility];  Service: Cardiovascular;  Laterality: N/A;   CARDIOVERSION N/A 05/11/2016   Procedure: CARDIOVERSION;  Surgeon: Sanda Klein, MD;  Location: [facility];  Service: Cardiovascular;  Laterality: N/A;   CARDIOVERSION N/A 05/24/2017   Procedure: CARDIOVERSION;  Surgeon: Sueanne Margarita, MD;  Location: [facility];  Service: Cardiovascular;  Laterality: N/A;   CARDIOVERSION N/A 11/19/2017   Procedure: CARDIOVERSION;  Surgeon: Pixie Casino, MD;  Location: [facility];  Service: Cardiovascular;  Laterality: N/A;   [place] N/A 11/30/2015   Procedure: Atrial Fibrillation Ablation;  Surgeon: Thompson Grayer, MD;  Location: [facility];  Service: Cardiovascular;  Laterality: N/A;   TEE WITHOUT CARDIOVERSION N/A 05/11/2016   Procedure: TRANSESOPHAGEAL ECHOCARDIOGRAM (TEE);  Surgeon: Sanda Klein, MD;  Location: [facility];  Service: Cardiovascular;  Laterality: N/A;   [place] Left 06/15/2016   Procedure: TOTAL SHOULDER ARTHROPLASTY;  Surgeon: Tania Ade, MD;  Location: [facility];  Service: Orthopedics;  Laterality: Left;  Left total shoulder arthroplasty   TRANSURETHRAL RESECTION OF PROSTATE N/A 07/02/2020   Procedure: TRANSURETHRAL RESECTION OF THE PROSTATE (TURP);  Surgeon: Lucas Mallow, MD;  Location: [facility];  Service: Urology;  Laterality: N/A;   Patient Active Problem List   Diagnosis Date Noted   Parkinson's disease without dyskinesia or fluctuating manifestations 12/21/2021   BPH (benign prostatic hyperplasia) 07/02/2020   Myelopathy ([place]) 04/26/2020   Atrial  flutter ([place]) 12/19/2016   S/P shoulder replacement, left 06/15/2016   A-fib ([place]) 11/30/2015   HTN (hypertension)    Hypercholesterolemia    Deafness in left ear    Atrial fibrillation ([place])    Hypopotassemia     ONSET DATE: ~7 years  REFERRING DIAG: G20.A1 (ICD-10-CM) - Parkinson's disease without dyskinesia or fluctuating manifestations   THERAPY DIAG:  Unsteadiness on feet  Muscle weakness (generalized)  Other symptoms and signs involving the nervous system  Difficulty in walking, not elsewhere classified  Rationale for Evaluation and Treatment: Rehabilitation  SUBJECTIVE:                                                                                                                                                                                             SUBJECTIVE STATEMENT: Want to make sure to do as well as I can going well.    Pt accompanied by: significant other  PERTINENT HISTORY: L acoustic neuroma s/p resection 2022, L ear deafness, HTN, HLD, a-fib, cervical fusion 2022, L TSA 2018  PAIN:  Are you having pain? No  PRECAUTIONS: Other: L hearing loss  WEIGHT BEARING RESTRICTIONS: No  FALLS: Has patient fallen in last 6 months? No  LIVING ENVIRONMENT: Lives with: lives with their spouse Lives in: House/apartment Stairs:  4-5 steps to enter with B sides; 1 story home Has following equipment at home: Single point cane  PLOF: Independent and Leisure: playing music  PATIENT GOALS: work on balance   OBJECTIVE:    TODAY'S TREATMENT: 01/26/2022 Activity Comments  Reviewed initial HEP (see below from eval) Pt return demo understanding  Standing PWR! Moves: -PWR! Up for posture x 8 reps -PWR! Rock (lateral reach) x 8 reps -PWR! Twist (modified in corner-reaching across body) -PWR! Step with UE support Visual cues of mirror, tactile and verbal cues for large amplitude movement patterns  Seated bike, BLEs only x 5 minutes, varied speeds, with resistance  level 2 Cues for intervals to get 80-90 RPM; pt rates effort level as 6/10 for aerobic activity  Gait 50 ft x 2 reps, cues for  increased step length, upright posture, reciprocal arm swing          PATIENT EDUCATION: Education details: Initiated standing PWR! Moves; educated on higher intensity, larger amplitude movements and effort level with aerobic exercise that pt is currently doing at [facility] on recumbent bike (cued to keep effort level at 6-8/10) Person educated: Patient and Therapist, art, Media planner, and Handouts Education method: Explanation, Demonstration, and Handouts Education comprehension: verbalized understanding, returned demonstration, and needs further education   ------------------------------------------------------------------------------------------------ Objective measures below taken at initial evaluation: DIAGNOSTIC FINDINGS: none recent  COGNITION: Overall cognitive status: Within functional limits for tasks assessed   SENSATION: WFL to light touch  COORDINATION: Alternating pronation/supination: movements becoming smaller with each movement Alternating toe tap: movements becoming smaller with each movement Finger to nose: intact B  L hand tremor   MUSCLE TONE: WNL B    POSTURE: rounded shoulders, forward head, and increased thoracic kyphosis  LOWER EXTREMITY ROM:     Active  Right Eval Left Eval  Hip flexion    Hip extension    Hip abduction    Hip adduction    Hip internal rotation    Hip external rotation    Knee flexion    Knee extension    Ankle dorsiflexion 12 *compensates with knee 15  Ankle plantarflexion    Ankle inversion    Ankle eversion     (Blank rows = not tested)  LOWER EXTREMITY MMT:    MMT (in sitting except PF) Right Eval Left Eval  Hip flexion 4 4+  Hip extension    Hip abduction 4+ 4  Hip adduction 4+ 4  Hip internal rotation    Hip external rotation    Knee flexion 4 4  Knee extension 5 4  Ankle  dorsiflexion 4 4+  Ankle plantarflexion 3+ 3+  Ankle inversion    Ankle eversion    (Blank rows = not tested)   GAIT: Gait pattern:  poor foot clearance and reduced B step length with L foot occasionally catching on floor  Assistive device utilized: None Level of assistance: Complete Independence   FUNCTIONAL TESTS:        TODAY'S TREATMENT:                                                                                                                              DATE: 01/24/22    PATIENT EDUCATION: Education details: prognosis, POC, HEP; exam findings; benefits of OT- patient and wife want to pursue this  Person educated: Patient and Spouse Education method: Explanation, Demonstration, Tactile cues, Verbal cues, and Handouts Education comprehension: verbalized understanding and returned demonstration  HOME EXERCISE PROGRAM: Access Code: [email or web address] URL: [email or web address] Date: 01/24/2022 Prepared by: [facility]  Exercises - Single Leg Stance with Support  - 1 x daily - 5 x weekly - 2-3 sets - 30 sec hold - Standing Hip Abduction with Resistance at Ankles and Counter  Support  - 1 x daily - 5 x weekly - 2 sets - 10 reps - Supine Bridge with Mini Swiss Ball Between Knees  - 1 x daily - 5 x weekly - 2 sets - 10 reps - Squat with Chair Touch  - 1 x daily - 5 x weekly - 2 sets - 10 reps   GOALS: Goals reviewed with patient? Yes  SHORT TERM GOALS: Target date: 02/14/2022  Patient to be independent with initial HEP. Baseline: HEP initiated Goal status: INITIAL    LONG TERM GOALS: Target date: 03/07/2022  Patient to be independent with advanced HEP. Baseline: Not yet initiated  Goal status: INITIAL  Patient to verbalize understanding of local Parkinson's Disease community resources including community fitness post D/C.   Baseline: Not yet initiated  Goal status: INITIAL  Patient to improve MiniBestTest score  to at least 25 to decrease risk of falls.  Baseline: 23 Goal status: INITIAL  Patient to demonstrate <10% increase between TUG and TUG cognitive/manual to improve ability to dual task in home/community.  Baseline: 9.2% increase in manual, 34.6% increase in cognitive  Goal status: INITIAL  Patient to verbalize understanding of fall prevention in home environment information. Baseline: Not yet initiated Goal status: INITIAL    ASSESSMENT:  CLINICAL IMPRESSION: Pt returns to [place] today and reviewed initial HEP to work on strengthening exercises.  Initiated standing PWR! Moves ([facility]) to address large intensity, higher amplitude movement patterns in standing for posture, weightshifting, trunk rotation (performed in corner), and stepping.  Pt requires tactile, visual, and verbal cues for technique and intensity of exercises, but overall responds well to increased amplitude movement patterns.  At end of session, reinforced ways to incorporate higher intensity to aerobic activities and worked briefly on gait with attention to stride length and arm swing.  He will continue to benefit from skilled PT towards goals to improve functional mobility and balance.  OBJECTIVE IMPAIRMENTS: Abnormal gait, decreased balance, decreased coordination, decreased strength, impaired flexibility, improper body mechanics, and postural dysfunction.   ACTIVITY LIMITATIONS: carrying, lifting, bending, standing, squatting, stairs, bathing, toileting, dressing, hygiene/grooming, and caring for others  PARTICIPATION LIMITATIONS: meal prep, cleaning, laundry, shopping, community activity, yard work, and church  PERSONAL FACTORS: Age, Fitness, Past/current experiences, Time since onset of injury/illness/exacerbation, and 3+ comorbidities: L acoustic neuroma s/p resection 2022, L ear deafness, HTN, HLD, a-fib, cervical fusion 2022, L TSA 2018  are also affecting patient's functional outcome.   REHAB  POTENTIAL: Good  CLINICAL DECISION MAKING: Evolving/moderate complexity  EVALUATION COMPLEXITY: Moderate  PLAN:  PT FREQUENCY:  2x/week for 3 weeks, then 1x/week for 3 weeks  PT DURATION: 6 weeks  PLANNED INTERVENTIONS: Therapeutic exercises, Therapeutic activity, Neuromuscular re-education, Balance training, Gait training, Patient/Family education, Self Care, Joint mobilization, Stair training, Vestibular training, Canalith repositioning, DME instructions, Aquatic Therapy, Dry Needling, Electrical stimulation, Cryotherapy, Moist heat, Taping, Manual therapy, and Re-evaluation  PLAN FOR NEXT SESSION: Review and progress PWR! Moves; work on large amplitude movement patterns:  forward/back stepping with addition of work on arm swing; progress BLE strengthening, high level balance- SLS, compliant surface with [place], PT 01/26/22 3:34 PM Phone: [phone] Fax: [place] [facility] [address] [place], [place] [address] Phone # [phone] Fax # [phone]

## 2022-01-30 NOTE — Therapy (Signed)
OUTPATIENT PHYSICAL THERAPY NEURO TREATMENT NOTE   Patient Name: Patrick Gray MRN: [id number] DOB:[birth year], 66 y.o., male Today's Date: 01/31/2022   PCP: [name], MD  REFERRING PROVIDER: Ludwig Clarks, DO  END OF SESSION:  PT End of Session - 01/31/22 1700     Visit Number 3    Number of Visits 10    Date for PT Re-Evaluation 03/07/22    Authorization Type [employer]    Authorization Time Period 01/24/2022-03/07/2022    Authorization - Visit Number 3    Authorization - Number of Visits 10    PT Start Time 8295    PT Stop Time 1657    PT Time Calculation (min) 42 min    Equipment Utilized During Treatment Gait belt    Activity Tolerance Patient tolerated treatment well    Behavior During Therapy WFL for tasks assessed/performed               Past Medical History:  Diagnosis Date   Acoustic neuroma ([place])    left   Anxiety    Arthritis    Complication of anesthesia    Delusion, violent, hallucination, hospitalized 8 days, urine retention   Deafness in left ear    Delusion ([place])    after fall on pain meds and prednisone   Dysrhythmia    PAF   Foley catheter in place    H/O cardiac radiofrequency ablation    HTN (hypertension)    Hypercholesterolemia    Hypopotassemia    Insomnia    Paroxysmal atrial fibrillation (HCC)    Radiculopathy    after fall   Tremor    left hand   Vitamin B 12 deficiency    Past Surgical History:  Procedure Laterality Date   ACOUSTIC NEUROMA RESECTION Left 19990   ANTERIOR CERVICAL DECOMP/DISCECTOMY FUSION N/A 04/26/2020   Procedure: Anterior Cervical Decompression Fusion - Cervical five-Cervical six - Cervical six-Cervical seven;  Surgeon: Kary Kos, MD;  Location: [facility] [facility];  Service: Neurosurgery;  Laterality: N/A;   APPENDECTOMY  1976   ATRIAL FIBRILLATION ABLATION N/A 12/19/2016   Procedure: ATRIAL FIBRILLATION ABLATION;  Surgeon: Thompson Grayer, MD;  Location: [facility];  Service: Cardiovascular;   Laterality: N/A;   CARDIAC CATHETERIZATION N/A 10/07/2015   Procedure: Left Heart Cath and Coronary Angiography;  Surgeon: Josue Hector, MD;  Location: [facility];  Service: Cardiovascular;  Laterality: N/A;   CARDIOVERSION N/A 06/17/2013   Procedure: CARDIOVERSION;  Surgeon: Josue Hector, MD;  Location: [facility];  Service: Cardiovascular;  Laterality: N/A;   CARDIOVERSION N/A 05/11/2016   Procedure: CARDIOVERSION;  Surgeon: Sanda Klein, MD;  Location: [facility];  Service: Cardiovascular;  Laterality: N/A;   CARDIOVERSION N/A 05/24/2017   Procedure: CARDIOVERSION;  Surgeon: Sueanne Margarita, MD;  [facility];  Service: Cardiovascular;  Laterality: N/A;   CARDIOVERSION N/A 11/19/2017   Procedure: CARDIOVERSION;  Surgeon: Pixie Casino, MD;  Location: [facility];  Service: Cardiovascular;  Laterality: N/A;   [place] N/A 11/30/2015   Procedure: Atrial Fibrillation Ablation;  Surgeon: Thompson Grayer, MD;  Location: [place] [facility];  Service: Cardiovascular;  Laterality: N/A;   TEE WITHOUT CARDIOVERSION N/A 05/11/2016   Procedure: TRANSESOPHAGEAL ECHOCARDIOGRAM (TEE);  Surgeon: Sanda Klein, MD;  Location: [facility];  Service: Cardiovascular;  Laterality: N/A;   [place] Left 06/15/2016   Procedure: TOTAL  SHOULDER ARTHROPLASTY;  Surgeon: Tania Ade, MD;  Location: [facility];  Service: Orthopedics;  Laterality: Left;  Left total shoulder arthroplasty   TRANSURETHRAL RESECTION OF PROSTATE N/A 07/02/2020   Procedure: TRANSURETHRAL RESECTION OF THE PROSTATE (TURP);  Surgeon: Lucas Mallow, MD;  Location: [facility];  Service: Urology;  Laterality: N/A;   Patient Active Problem List   Diagnosis Date Noted   Parkinson's disease without dyskinesia or fluctuating manifestations 12/21/2021   BPH (benign prostatic hyperplasia) 07/02/2020   Myelopathy ([place])  04/26/2020   Atrial flutter ([place]) 12/19/2016   S/P shoulder replacement, left 06/15/2016   A-fib ([place]) 11/30/2015   HTN (hypertension)    Hypercholesterolemia    Deafness in left ear    Atrial fibrillation ([place])    Hypopotassemia     ONSET DATE: ~7 years  REFERRING DIAG: G20.A1 (ICD-10-CM) - Parkinson's disease without dyskinesia or fluctuating manifestations   THERAPY DIAG:  Unsteadiness on feet  Muscle weakness (generalized)  Other symptoms and signs involving the nervous system  Difficulty in walking, not elsewhere classified  Rationale for Evaluation and Treatment: Rehabilitation  SUBJECTIVE:                                                                                                                                                                                             SUBJECTIVE STATEMENT: Things are about the same.   Pt accompanied by: significant other  PERTINENT HISTORY: L acoustic neuroma s/p resection 2022, L ear deafness, HTN, HLD, a-fib, cervical fusion 2022, L TSA 2018  PAIN:  Are you having pain? No  PRECAUTIONS: Other: L hearing loss  WEIGHT BEARING RESTRICTIONS: No  FALLS: Has patient fallen in last 6 months? No  LIVING ENVIRONMENT: Lives with: lives with their spouse Lives in: House/apartment Stairs:  4-5 steps to enter with B sides; 1 story home Has following equipment at home: Single point cane  PLOF: Independent and Leisure: playing music  PATIENT GOALS: work on balance   OBJECTIVE:     TODAY'S TREATMENT: 01/31/22 Activity Comments  bike LEs only L2.5 x5 min Maintaining at least 65 RPM; pt rated effort as 5/10   staggered ant/pos wt shift + arm swing; chair for support PRN In front of mirror; performed without UE swing, then with arm swing; heavy cueing for increased amplitude and coordinating Ues/Les   wide stance lateral wt shift + reach; chair for support PRN In front of mirror; CGA and verbal/manual cueing to increase  intensity of movement, promote large stretch, and maintain B feet down with wt shift   fwd/back stepping R/L 15x More difficulty moving the L LE;  better when stepping the R side; CGA;   toe tap to 3 cones each LE Weaning UE support from chair; CGA-min A    HOME EXERCISE PROGRAM Last updated: 01/31/22 Access Code: [email or web address] URL: [email or web address] Date: 01/31/2022 Prepared by: [facility]  Exercises - Single Leg Stance with Support  - 1 x daily - 5 x weekly - 2-3 sets - 30 sec hold - Standing Hip Abduction with Resistance at Ankles and Counter Support  - 1 x daily - 5 x weekly - 2 sets - 10 reps - Supine Bridge with Mini Swiss Ball Between Knees  - 1 x daily - 5 x weekly - 2 sets - 10 reps - Squat with Chair Touch  - 1 x daily - 5 x weekly - 2 sets - 10 reps - Standing Reach to Side with Weight Shift  - 1 x daily - 5 x weekly - 2 sets - 10 reps - Staggered Stance Weight Shift with Arms Reaching  - 1 x daily - 5 x weekly - 2 sets - 10 reps   PATIENT EDUCATION: Education details: HEP update- to be performed at counter for safety; answered patient's and spouse's questions Person educated: Patient and Spouse Education method: Explanation, Demonstration, Tactile cues, Verbal cues, and Handouts Education comprehension: verbalized understanding and returned demonstration   ------------------------------------------------------------------------------------------------ Objective measures below taken at initial evaluation: DIAGNOSTIC FINDINGS: none recent  COGNITION: Overall cognitive status: Within functional limits for tasks assessed   SENSATION: WFL to light touch  COORDINATION: Alternating pronation/supination: movements becoming smaller with each movement Alternating toe tap: movements becoming smaller with each movement Finger to nose: intact B  L hand tremor   MUSCLE TONE: WNL B    POSTURE: rounded shoulders, forward  head, and increased thoracic kyphosis  LOWER EXTREMITY ROM:     Active  Right Eval Left Eval  Hip flexion    Hip extension    Hip abduction    Hip adduction    Hip internal rotation    Hip external rotation    Knee flexion    Knee extension    Ankle dorsiflexion 12 *compensates with knee 15  Ankle plantarflexion    Ankle inversion    Ankle eversion     (Blank rows = not tested)  LOWER EXTREMITY MMT:    MMT (in sitting except PF) Right Eval Left Eval  Hip flexion 4 4+  Hip extension    Hip abduction 4+ 4  Hip adduction 4+ 4  Hip internal rotation    Hip external rotation    Knee flexion 4 4  Knee extension 5 4  Ankle dorsiflexion 4 4+  Ankle plantarflexion 3+ 3+  Ankle inversion    Ankle eversion    (Blank rows = not tested)   GAIT: Gait pattern:  poor foot clearance and reduced B step length with L foot occasionally catching on floor  Assistive device utilized: None Level of assistance: Complete Independence   FUNCTIONAL TESTS:        TODAY'S TREATMENT:  DATE: 01/24/22    PATIENT EDUCATION: Education details: prognosis, POC, HEP; exam findings; benefits of OT- patient and wife want to pursue this  Person educated: Patient and Spouse Education method: Explanation, Demonstration, Tactile cues, Verbal cues, and Handouts Education comprehension: verbalized understanding and returned demonstration  HOME EXERCISE PROGRAM: Access Code: [email or web address] URL: [email or web address] Date: 01/24/2022 Prepared by: [facility]  Exercises - Single Leg Stance with Support  - 1 x daily - 5 x weekly - 2-3 sets - 30 sec hold - Standing Hip Abduction with Resistance at Ankles and Counter Support  - 1 x daily - 5 x weekly - 2 sets - 10 reps - Supine Bridge with Mini Swiss Ball Between Knees  - 1 x  daily - 5 x weekly - 2 sets - 10 reps - Squat with Chair Touch  - 1 x daily - 5 x weekly - 2 sets - 10 reps   GOALS: Goals reviewed with patient? Yes  SHORT TERM GOALS: Target date: 02/14/2022  Patient to be independent with initial HEP. Baseline: HEP initiated Goal status: IN PROGRESS    LONG TERM GOALS: Target date: 03/07/2022  Patient to be independent with advanced HEP. Baseline: Not yet initiated  Goal status: IN PROGRESS  Patient to verbalize understanding of local Parkinson's Disease community resources including community fitness post D/C.   Baseline: Not yet initiated  Goal status: IN PROGRESS  Patient to improve MiniBestTest score to at least 25 to decrease risk of falls.  Baseline: 23 Goal status: IN PROGRESS  Patient to demonstrate <10% increase between TUG and TUG cognitive/manual to improve ability to dual task in home/community.  Baseline: 9.2% increase in manual, 34.6% increase in cognitive  Goal status: IN PROGRESS  Patient to verbalize understanding of fall prevention in home environment information. Baseline: Not yet initiated Goal status: IN PROGRESS    ASSESSMENT:  CLINICAL IMPRESSION: Patient arrived to session without complaints. Continued working on large amplitude movements with mirror feedback. Patient with clasped L hand during most balance activities, requiring cueing to relax. Also benefitted from manual and verbal cueing to follow along with activities with max coordination and proper sequencing. Patient responded well to cues to shift onto standing LE with SLS tasks. Patient reported understanding of all edu provided and without complaints at end of session.    OBJECTIVE IMPAIRMENTS: Abnormal gait, decreased balance, decreased coordination, decreased strength, impaired flexibility, improper body mechanics, and postural dysfunction.   ACTIVITY LIMITATIONS: carrying, lifting, bending, standing, squatting, stairs, bathing, toileting, dressing,  hygiene/grooming, and caring for others  PARTICIPATION LIMITATIONS: meal prep, cleaning, laundry, shopping, community activity, yard work, and church  PERSONAL FACTORS: Age, Fitness, Past/current experiences, Time since onset of injury/illness/exacerbation, and 3+ comorbidities: L acoustic neuroma s/p resection 2022, L ear deafness, HTN, HLD, a-fib, cervical fusion 2022, L TSA 2018  are also affecting patient's functional outcome.   REHAB POTENTIAL: Good  CLINICAL DECISION MAKING: Evolving/moderate complexity  EVALUATION COMPLEXITY: Moderate  PLAN:  PT FREQUENCY:  2x/week for 3 weeks, then 1x/week for 3 weeks  PT DURATION: 6 weeks  PLANNED INTERVENTIONS: Therapeutic exercises, Therapeutic activity, Neuromuscular re-education, Balance training, Gait training, Patient/Family education, Self Care, Joint mobilization, Stair training, Vestibular training, Canalith repositioning, DME instructions, Aquatic Therapy, Dry Needling, Electrical stimulation, Cryotherapy, Moist heat, Taping, Manual therapy, and Re-evaluation  PLAN FOR NEXT SESSION: Review and progress PWR! Moves; work on large amplitude movement patterns:  forward/back stepping with addition of work on arm swing; progress  BLE strengthening, high level balance- SLS, compliant surface with EC    Janene Harvey, PT, DPT 01/31/22 5:02 PM  [facility] at [facility] [address] [place], [place], [place] [address] Phone # [phone] Fax # [phone]

## 2022-01-31 ENCOUNTER — Ambulatory Visit: Payer: Medicare PPO | Admitting: Physical Therapy

## 2022-01-31 ENCOUNTER — Encounter: Payer: Self-pay | Admitting: Physical Therapy

## 2022-01-31 DIAGNOSIS — R29818 Other symptoms and signs involving the nervous system: Secondary | ICD-10-CM

## 2022-01-31 DIAGNOSIS — M6281 Muscle weakness (generalized): Secondary | ICD-10-CM

## 2022-01-31 DIAGNOSIS — R278 Other lack of coordination: Secondary | ICD-10-CM | POA: Diagnosis not present

## 2022-01-31 DIAGNOSIS — R2681 Unsteadiness on feet: Secondary | ICD-10-CM

## 2022-01-31 DIAGNOSIS — R262 Difficulty in walking, not elsewhere classified: Secondary | ICD-10-CM

## 2022-01-31 DIAGNOSIS — G20A1 Parkinson's disease without dyskinesia, without mention of fluctuations: Secondary | ICD-10-CM | POA: Diagnosis not present

## 2022-02-02 ENCOUNTER — Encounter: Payer: Self-pay | Admitting: Physical Therapy

## 2022-02-02 ENCOUNTER — Ambulatory Visit: Payer: Medicare PPO | Admitting: Physical Therapy

## 2022-02-02 DIAGNOSIS — R278 Other lack of coordination: Secondary | ICD-10-CM | POA: Diagnosis not present

## 2022-02-02 DIAGNOSIS — R29818 Other symptoms and signs involving the nervous system: Secondary | ICD-10-CM

## 2022-02-02 DIAGNOSIS — G20A1 Parkinson's disease without dyskinesia, without mention of fluctuations: Secondary | ICD-10-CM | POA: Diagnosis not present

## 2022-02-02 DIAGNOSIS — M6281 Muscle weakness (generalized): Secondary | ICD-10-CM

## 2022-02-02 DIAGNOSIS — R2681 Unsteadiness on feet: Secondary | ICD-10-CM

## 2022-02-02 DIAGNOSIS — R262 Difficulty in walking, not elsewhere classified: Secondary | ICD-10-CM | POA: Diagnosis not present

## 2022-02-02 NOTE — Therapy (Signed)
OUTPATIENT PHYSICAL THERAPY NEURO TREATMENT NOTE   Patient Name: Patrick Gray MRN: [id number] DOB:1955-04-23, 66 y.o., male Today's Date: 02/03/2022   PCP: [name], MD  REFERRING PROVIDER: Ludwig Clarks, DO  END OF SESSION:  PT End of Session - 02/02/22 1534     Visit Number 4    Number of Visits 10    Date for PT Re-Evaluation 03/07/22    Authorization Type [employer]    Authorization Time Period 01/24/2022-03/07/2022    Authorization - Visit Number 4    Authorization - Number of Visits 10    PT Start Time 4098    PT Stop Time 1191    PT Time Calculation (min) 40 min    Equipment Utilized During Treatment --    Activity Tolerance Patient tolerated treatment well    Behavior During Therapy WFL for tasks assessed/performed               Past Medical History:  Diagnosis Date   Acoustic neuroma ([place])    left   Anxiety    Arthritis    Complication of anesthesia    Delusion, violent, hallucination, hospitalized 8 days, urine retention   Deafness in left ear    Delusion ([place])    after fall on pain meds and prednisone   Dysrhythmia    PAF   Foley catheter in place    H/O cardiac radiofrequency ablation    HTN (hypertension)    Hypercholesterolemia    Hypopotassemia    Insomnia    Paroxysmal atrial fibrillation (HCC)    Radiculopathy    after fall   Tremor    left hand   Vitamin B 12 deficiency    Past Surgical History:  Procedure Laterality Date   ACOUSTIC NEUROMA RESECTION Left 19990   ANTERIOR CERVICAL DECOMP/DISCECTOMY FUSION N/A 04/26/2020   Procedure: Anterior Cervical Decompression Fusion - Cervical five-Cervical six - Cervical six-Cervical seven;  Surgeon: Kary Kos, MD;  Location: [place];  Service: Neurosurgery;  Laterality: N/A;   APPENDECTOMY  1976   ATRIAL FIBRILLATION ABLATION N/A 12/19/2016   Procedure: ATRIAL FIBRILLATION ABLATION;  Surgeon: Thompson Grayer, MD;  Location: [place] [facility];  Service: Cardiovascular;  Laterality:  N/A;   CARDIAC CATHETERIZATION N/A 10/07/2015   Procedure: Left Heart Cath and Coronary Angiography;  Surgeon: Josue Hector, MD;  Location: [place] [facility];  Service: Cardiovascular;  Laterality: N/A;   CARDIOVERSION N/A 06/17/2013   Procedure: CARDIOVERSION;  Surgeon: Josue Hector, MD;  Location: [place];  Service: Cardiovascular;  Laterality: N/A;   CARDIOVERSION N/A 05/11/2016   Procedure: CARDIOVERSION;  Surgeon: Sanda Klein, MD;  Location: [place] [facility];  Service: Cardiovascular;  Laterality: N/A;   CARDIOVERSION N/A 05/24/2017   Procedure: CARDIOVERSION;  Surgeon: Sueanne Margarita, MD;  Location: [facility];  Service: Cardiovascular;  Laterality: N/A;   CARDIOVERSION N/A 11/19/2017   Procedure: CARDIOVERSION;  Surgeon: Pixie Casino, MD;  Location: [facility];  Service: Cardiovascular;  Laterality: N/A;   [place] N/A 11/30/2015   Procedure: Atrial Fibrillation Ablation;  Surgeon: Thompson Grayer, MD;  Location: [place] [facility];  Service: Cardiovascular;  Laterality: N/A;   TEE WITHOUT CARDIOVERSION N/A 05/11/2016   Procedure: TRANSESOPHAGEAL ECHOCARDIOGRAM (TEE);  Surgeon: Sanda Klein, MD;  Location: [facility]COPY;  Service: Cardiovascular;  Laterality: N/A;   [place] Left 06/15/2016   Procedure: TOTAL SHOULDER  ARTHROPLASTY;  Surgeon: Tania Ade, MD;  Location: [place];  Service: Orthopedics;  Laterality: Left;  Left total shoulder arthroplasty   TRANSURETHRAL RESECTION OF PROSTATE N/A 07/02/2020   Procedure: TRANSURETHRAL RESECTION OF THE PROSTATE (TURP);  Surgeon: Lucas Mallow, MD;  Location: [facility];  Service: Urology;  Laterality: N/A;   Patient Active Problem List   Diagnosis Date Noted   Parkinson's disease without dyskinesia or fluctuating manifestations 12/21/2021   BPH (benign prostatic hyperplasia) 07/02/2020   Myelopathy ([place]) 04/26/2020    Atrial flutter ([place]) 12/19/2016   S/P shoulder replacement, left 06/15/2016   A-fib ([place]) 11/30/2015   HTN (hypertension)    Hypercholesterolemia    Deafness in left ear    Atrial fibrillation ([place])    Hypopotassemia     ONSET DATE: ~7 years  REFERRING DIAG: G20.A1 (ICD-10-CM) - Parkinson's disease without dyskinesia or fluctuating manifestations   THERAPY DIAG:  Unsteadiness on feet  Other symptoms and signs involving the nervous system  Muscle weakness (generalized)  Rationale for Evaluation and Treatment: Rehabilitation  SUBJECTIVE:                                                                                                                                                                                             SUBJECTIVE STATEMENT: Working well at home to have feedback and guide of wife for exercises.  Didn't like the mirror for feedback last visit.  Wife feels his walking is better.  Pt accompanied by: significant other  PERTINENT HISTORY: L acoustic neuroma s/p resection 2022, L ear deafness, HTN, HLD, a-fib, cervical fusion 2022, L TSA 2018  PAIN:  Are you having pain? No  PRECAUTIONS: Other: L hearing loss  WEIGHT BEARING RESTRICTIONS: No  FALLS: Has patient fallen in last 6 months? No  LIVING ENVIRONMENT: Lives with: lives with their spouse Lives in: House/apartment Stairs:  4-5 steps to enter with B sides; 1 story home Has following equipment at home: Single point cane  PLOF: Independent and Leisure: playing music  PATIENT GOALS: work on balance   OBJECTIVE:    TODAY'S TREATMENT: 02/02/2022 Activity Comments  PWR! Moves standing: -PWR! Up for posture  -PWR! Rock for Advance (performed with side reach) -PWR! Twist for trunk rotation (performed in corner) -PWR! Step for step initiation Visual/demo cues with therapist in front of patient, with occasional verbal cues  -initial slow, sustained motions, followed by increased intensity   Reviewed stagger stance forward/back rocking at counter Tactile, demo cues to start with weightshift through hips, then add arm swing  Forward/back step and weightshift at counter, for increased step length and weightshift  UE support and cues  Gait training using bilateral walking poles to facilitate reciprocal arm swing with increased step length, 50 ft x 6 reps, then poles removed x 100 ft Once cues removed, cues to relax arms and focus on increased step length, with improved reciprocal sequence noted   Forward/back walking at counter 3 reps Focus on increased, equal/even step length       PATIENT EDUCATION: Education details: Review of HEP and reviewed rationale for PWR! Moves exercises; verbally added forward/back walking at counter.  Emphasis on large amplitude/exaggerated movement patterns (to drive normal movement patterns) Person educated: Patient and Spouse Education method: Explanation, Demonstration, and Verbal cues Education comprehension: verbalized understanding and returned demonstration    HOME EXERCISE PROGRAM Last updated: 01/31/22 Access Code: [email or web address] URL: [email or web address] Date: 01/31/2022 Prepared by: [facility]  Exercises - Single Leg Stance with Support  - 1 x daily - 5 x weekly - 2-3 sets - 30 sec hold - Standing Hip Abduction with Resistance at Ankles and Counter Support  - 1 x daily - 5 x weekly - 2 sets - 10 reps - Supine Bridge with Mini Swiss Ball Between Knees  - 1 x daily - 5 x weekly - 2 sets - 10 reps - Squat with Chair Touch  - 1 x daily - 5 x weekly - 2 sets - 10 reps - Standing Reach to Side with Weight Shift  - 1 x daily - 5 x weekly - 2 sets - 10 reps - Staggered Stance Weight Shift with Arms Reaching  - 1 x daily - 5 x weekly - 2 sets - 10 reps     ------------------------------------------------------------------------------------------------ Objective measures below taken at initial  evaluation: DIAGNOSTIC FINDINGS: none recent  COGNITION: Overall cognitive status: Within functional limits for tasks assessed   SENSATION: WFL to light touch  COORDINATION: Alternating pronation/supination: movements becoming smaller with each movement Alternating toe tap: movements becoming smaller with each movement Finger to nose: intact B  L hand tremor   MUSCLE TONE: WNL B    POSTURE: rounded shoulders, forward head, and increased thoracic kyphosis  LOWER EXTREMITY ROM:     Active  Right Eval Left Eval  Hip flexion    Hip extension    Hip abduction    Hip adduction    Hip internal rotation    Hip external rotation    Knee flexion    Knee extension    Ankle dorsiflexion 12 *compensates with knee 15  Ankle plantarflexion    Ankle inversion    Ankle eversion     (Blank rows = not tested)  LOWER EXTREMITY MMT:    MMT (in sitting except PF) Right Eval Left Eval  Hip flexion 4 4+  Hip extension    Hip abduction 4+ 4  Hip adduction 4+ 4  Hip internal rotation    Hip external rotation    Knee flexion 4 4  Knee extension 5 4  Ankle dorsiflexion 4 4+  Ankle plantarflexion 3+ 3+  Ankle inversion    Ankle eversion    (Blank rows = not tested)   GAIT: Gait pattern:  poor foot clearance and reduced B step length with L foot occasionally catching on floor  Assistive device utilized: None Level of assistance: Complete Independence   FUNCTIONAL TESTS:        TODAY'S TREATMENT:  DATE: 01/24/22    PATIENT EDUCATION: Education details: prognosis, POC, HEP; exam findings; benefits of OT- patient and wife want to pursue this  Person educated: Patient and Spouse Education method: Explanation, Demonstration, Tactile cues, Verbal cues, and Handouts Education comprehension: verbalized understanding and returned demonstration  HOME  EXERCISE PROGRAM: Access Code: [email or web address] URL: [email or web address] Date: 01/24/2022 Prepared by: [facility]  Exercises - Single Leg Stance with Support  - 1 x daily - 5 x weekly - 2-3 sets - 30 sec hold - Standing Hip Abduction with Resistance at Ankles and Counter Support  - 1 x daily - 5 x weekly - 2 sets - 10 reps - Supine Bridge with Mini Swiss Ball Between Knees  - 1 x daily - 5 x weekly - 2 sets - 10 reps - Squat with Chair Touch  - 1 x daily - 5 x weekly - 2 sets - 10 reps   GOALS: Goals reviewed with patient? Yes  SHORT TERM GOALS: Target date: 02/14/2022  Patient to be independent with initial HEP. Baseline: HEP initiated Goal status: IN PROGRESS    LONG TERM GOALS: Target date: 03/07/2022  Patient to be independent with advanced HEP. Baseline: Not yet initiated  Goal status: IN PROGRESS  Patient to verbalize understanding of local Parkinson's Disease community resources including community fitness post D/C.   Baseline: Not yet initiated  Goal status: IN PROGRESS  Patient to improve MiniBestTest score to at least 25 to decrease risk of falls.  Baseline: 23 Goal status: IN PROGRESS  Patient to demonstrate <10% increase between TUG and TUG cognitive/manual to improve ability to dual task in home/community.  Baseline: 9.2% increase in manual, 34.6% increase in cognitive  Goal status: IN PROGRESS  Patient to verbalize understanding of fall prevention in home environment information. Baseline: Not yet initiated Goal status: IN PROGRESS    ASSESSMENT:  CLINICAL IMPRESSION: Skilled PT session today continued to focus on large amplitude, whole body movements.  Pt responds well to demo cues with therapist  in front of patient for PWR! Moves (reports this has worked well with wife's assist at home).  He also responds well to initiating movements with weightshifting through hips and lower extremities prior to adding  in UE movements.  With repetition throughout sets of exercises, he improves quality and large amplitude of movements.  LUE continues to demo decreased coordination and timing with arm swing with gait and with exercises, but again, improves with repetition in session; pt to have OT eval next week.  He will continue to benefit from skilled PT towards goals for improved overall functional mobility and decreased fall risk.  OBJECTIVE IMPAIRMENTS: Abnormal gait, decreased balance, decreased coordination, decreased strength, impaired flexibility, improper body mechanics, and postural dysfunction.   ACTIVITY LIMITATIONS: carrying, lifting, bending, standing, squatting, stairs, bathing, toileting, dressing, hygiene/grooming, and caring for others  PARTICIPATION LIMITATIONS: meal prep, cleaning, laundry, shopping, community activity, yard work, and church  PERSONAL FACTORS: Age, Fitness, Past/current experiences, Time since onset of injury/illness/exacerbation, and 3+ comorbidities: L acoustic neuroma s/p resection 2022, L ear deafness, HTN, HLD, a-fib, cervical fusion 2022, L TSA 2018  are also affecting patient's functional outcome.   REHAB POTENTIAL: Good  CLINICAL DECISION MAKING: Evolving/moderate complexity  EVALUATION COMPLEXITY: Moderate  PLAN:  PT FREQUENCY:  2x/week for 3 weeks, then 1x/week for 3 weeks  PT DURATION: 6 weeks  PLANNED INTERVENTIONS: Therapeutic exercises, Therapeutic activity, Neuromuscular re-education, Balance training, Gait training, Patient/Family education, Self  Care, Joint mobilization, Stair training, Vestibular training, Canalith repositioning, DME instructions, Aquatic Therapy, Dry Needling, Electrical stimulation, Cryotherapy, Moist heat, Taping, Manual therapy, and Re-evaluation  PLAN FOR NEXT SESSION: Consider other positions of PWR! Moves; work on large amplitude movement patterns:  forward/back stepping with addition of work on arm swing; progress BLE  strengthening, high level balance- SLS, compliant surface with EC    Mady Haagensen, PT 02/03/22 7:40 AM Phone: [phone] Fax: [facility] [address], [place], [place] [address] Phone # [phone] Fax # [phone]

## 2022-02-07 ENCOUNTER — Ambulatory Visit: Payer: Medicare PPO | Admitting: Physical Therapy

## 2022-02-07 ENCOUNTER — Other Ambulatory Visit: Payer: Self-pay

## 2022-02-07 ENCOUNTER — Encounter: Payer: Self-pay | Admitting: Physical Therapy

## 2022-02-07 ENCOUNTER — Ambulatory Visit: Payer: Medicare PPO | Admitting: Occupational Therapy

## 2022-02-07 DIAGNOSIS — M6281 Muscle weakness (generalized): Secondary | ICD-10-CM | POA: Diagnosis not present

## 2022-02-07 DIAGNOSIS — R29818 Other symptoms and signs involving the nervous system: Secondary | ICD-10-CM

## 2022-02-07 DIAGNOSIS — R2681 Unsteadiness on feet: Secondary | ICD-10-CM

## 2022-02-07 DIAGNOSIS — R278 Other lack of coordination: Secondary | ICD-10-CM

## 2022-02-07 DIAGNOSIS — G20A1 Parkinson's disease without dyskinesia, without mention of fluctuations: Secondary | ICD-10-CM | POA: Diagnosis not present

## 2022-02-07 DIAGNOSIS — R262 Difficulty in walking, not elsewhere classified: Secondary | ICD-10-CM | POA: Diagnosis not present

## 2022-02-07 NOTE — Therapy (Signed)
OUTPATIENT PHYSICAL THERAPY NEURO TREATMENT NOTE   Patient Name: Patrick Gray MRN: [id number] DOB:25-Dec-1955, 66 y.o., male Today's Date: 02/07/2022   PCP: [name], MD  REFERRING PROVIDER: Ludwig Clarks, DO  END OF SESSION:  PT End of Session - 02/07/22 1404     Visit Number 5    Number of Visits 10    Date for PT Re-Evaluation 03/07/22    Authorization Type [employer]    Authorization Time Period 01/24/2022-03/07/2022    Authorization - Visit Number 5    Authorization - Number of Visits 10    PT Start Time 6301    PT Stop Time 1445    PT Time Calculation (min) 40 min    Equipment Utilized During Treatment Gait belt    Activity Tolerance Patient tolerated treatment well    Behavior During Therapy WFL for tasks assessed/performed               Past Medical History:  Diagnosis Date   Acoustic neuroma ([place])    left   Anxiety    Arthritis    Complication of anesthesia    Delusion, violent, hallucination, hospitalized 8 days, urine retention   Deafness in left ear    Delusion ([place])    after fall on pain meds and prednisone   Dysrhythmia    PAF   Foley catheter in place    H/O cardiac radiofrequency ablation    HTN (hypertension)    Hypercholesterolemia    Hypopotassemia    Insomnia    Paroxysmal atrial fibrillation (HCC)    Radiculopathy    after fall   Tremor    left hand   Vitamin B 12 deficiency    Past Surgical History:  Procedure Laterality Date   ACOUSTIC NEUROMA RESECTION Left 19990   ANTERIOR CERVICAL DECOMP/DISCECTOMY FUSION N/A 04/26/2020   Procedure: Anterior Cervical Decompression Fusion - Cervical five-Cervical six - Cervical six-Cervical seven;  Surgeon: Kary Kos, MD;  Location: [facility];  Service: Neurosurgery;  Laterality: N/A;   APPENDECTOMY  1976   ATRIAL FIBRILLATION ABLATION N/A 12/19/2016   Procedure: ATRIAL FIBRILLATION ABLATION;  Surgeon: Thompson Grayer, MD;  Location: [place];  Service: Cardiovascular;   Laterality: N/A;   CARDIAC CATHETERIZATION N/A 10/07/2015   Procedure: Left Heart Cath and Coronary Angiography;  Surgeon: Josue Hector, MD;  Location: [place] [facility];  Service: Cardiovascular;  Laterality: N/A;   CARDIOVERSION N/A 06/17/2013   Procedure: CARDIOVERSION;  Surgeon: Josue Hector, MD;  Location: [place];  Service: Cardiovascular;  Laterality: N/A;   CARDIOVERSION N/A 05/11/2016   Procedure: CARDIOVERSION;  Surgeon: Sanda Klein, MD;  Location: [place];  Service: Cardiovascular;  Laterality: N/A;   CARDIOVERSION N/A 05/24/2017   Procedure: CARDIOVERSION;  Surgeon: Sueanne Margarita, MD;  Location: [facility];  Service: Cardiovascular;  Laterality: N/A;   CARDIOVERSION N/A 11/19/2017   Procedure: CARDIOVERSION;  Surgeon: Pixie Casino, MD;  Location: [facility];  Service: Cardiovascular;  Laterality: N/A;   [place] N/A 11/30/2015   Procedure: Atrial Fibrillation Ablation;  Surgeon: Thompson Grayer, MD;  Location: [place] [facility];  Service: Cardiovascular;  Laterality: N/A;   TEE WITHOUT CARDIOVERSION N/A 05/11/2016   Procedure: TRANSESOPHAGEAL ECHOCARDIOGRAM (TEE);  Surgeon: Sanda Klein, MD;  Location: [facility];  Service: Cardiovascular;  Laterality: N/A;   [place] Left 06/15/2016   Procedure: TOTAL  SHOULDER ARTHROPLASTY;  Surgeon: Tania Ade, MD;  Location: [place];  Service: Orthopedics;  Laterality: Left;  Left total shoulder arthroplasty   TRANSURETHRAL RESECTION OF PROSTATE N/A 07/02/2020   Procedure: TRANSURETHRAL RESECTION OF THE PROSTATE (TURP);  Surgeon: Lucas Mallow, MD;  Location: [facility];  Service: Urology;  Laterality: N/A;   Patient Active Problem List   Diagnosis Date Noted   Parkinson's disease without dyskinesia or fluctuating manifestations 12/21/2021   BPH (benign prostatic hyperplasia) 07/02/2020   Myelopathy ([place])  04/26/2020   Atrial flutter ([place]) 12/19/2016   S/P shoulder replacement, left 06/15/2016   A-fib ([place]) 11/30/2015   HTN (hypertension)    Hypercholesterolemia    Deafness in left ear    Atrial fibrillation ([place])    Hypopotassemia     ONSET DATE: ~7 years  REFERRING DIAG: G20.A1 (ICD-10-CM) - Parkinson's disease without dyskinesia or fluctuating manifestations   THERAPY DIAG:  Unsteadiness on feet  Other symptoms and signs involving the nervous system  Muscle weakness (generalized)  Rationale for Evaluation and Treatment: Rehabilitation  SUBJECTIVE:                                                                                                                                                                                             SUBJECTIVE STATEMENT: Little wobbly today.  Doing well keeping up with exercises.  Pt accompanied by: significant other  PERTINENT HISTORY: L acoustic neuroma s/p resection 2022, L ear deafness, HTN, HLD, a-fib, cervical fusion 2022, L TSA 2018  PAIN:  Are you having pain? No  PRECAUTIONS: Other: L hearing loss  WEIGHT BEARING RESTRICTIONS: No  FALLS: Has patient fallen in last 6 months? No  LIVING ENVIRONMENT: Lives with: lives with their spouse Lives in: House/apartment Stairs:  4-5 steps to enter with B sides; 1 story home Has following equipment at home: Single point cane  PLOF: Independent and Leisure: playing music  PATIENT GOALS: work on balance   OBJECTIVE:    TODAY'S TREATMENT: 02/06/2022 Activity Comments  NuStep, Level 4>5, 4 extremities x 6 minutes Cues to keep SPM >80; rates intensity as 5-6/10 for aerobic warm-up activity  Forward/back walking at counter, 3 reps Narrowed BOS posterior direction-cues to widen BOS  stagger stance forward/back rocking at counter Good form, good addition of reciprocal arm swing; min cues for hinge at hips  Forward/back step and weightshift at counter, for increased step length and  weightshift UE support, scissors LLE behind RLE at times   Four square step activity, stepping over hurdles, at least 5 reps (clockwise and counterclockwise) UE support, with LLE as leading in posterior direction,  he often crosses midline, and takes extra time to balance bringing RLE posteriorly  Gait activities: -Large step and stop>gait with reciprocal arm swing -Gait with focus on large steps with relaxed arm swing -march with added opposite arm/knee taps Extra time to coordinate movements, able to self-correct approx 50% of the time   Access Code: [email or web address] URL: [email or web address] Date: 02/07/2022 Prepared by: [facility]  Exercises - Single Leg Stance with Support  - 1 x daily - 5 x weekly - 2-3 sets - 30 sec hold - Standing Hip Abduction with Resistance at Ankles and Counter Support  - 1 x daily - 5 x weekly - 2 sets - 10 reps - Supine Bridge with Mini Swiss Ball Between Knees  - 1 x daily - 5 x weekly - 2 sets - 10 reps - Squat with Chair Touch  - 1 x daily - 5 x weekly - 2 sets - 10 reps - Standing Reach to Side with Weight Shift  - 1 x daily - 5 x weekly - 2 sets - 10 reps - Staggered Stance Weight Shift with Arms Reaching  - 1 x daily - 5 x weekly - 2 sets - 10 reps - Alternating Step Backward with Support  - 1 x daily - 5 x weekly - 1-2 sets - 10 reps  PATIENT EDUCATION: Education details: addition to HEP-see above Person educated: Patient and Spouse Education method: Explanation, Demonstration, and Verbal cues Education comprehension: verbalized understanding and returned demonstration     ------------------------------------------------------------------------------------------------ Objective measures below taken at initial evaluation: DIAGNOSTIC FINDINGS: none recent  COGNITION: Overall cognitive status: Within functional limits for tasks assessed   SENSATION: WFL to light touch  COORDINATION: Alternating  pronation/supination: movements becoming smaller with each movement Alternating toe tap: movements becoming smaller with each movement Finger to nose: intact B  L hand tremor   MUSCLE TONE: WNL B    POSTURE: rounded shoulders, forward head, and increased thoracic kyphosis  LOWER EXTREMITY ROM:     Active  Right Eval Left Eval  Hip flexion    Hip extension    Hip abduction    Hip adduction    Hip internal rotation    Hip external rotation    Knee flexion    Knee extension    Ankle dorsiflexion 12 *compensates with knee 15  Ankle plantarflexion    Ankle inversion    Ankle eversion     (Blank rows = not tested)  LOWER EXTREMITY MMT:    MMT (in sitting except PF) Right Eval Left Eval  Hip flexion 4 4+  Hip extension    Hip abduction 4+ 4  Hip adduction 4+ 4  Hip internal rotation    Hip external rotation    Knee flexion 4 4  Knee extension 5 4  Ankle dorsiflexion 4 4+  Ankle plantarflexion 3+ 3+  Ankle inversion    Ankle eversion    (Blank rows = not tested)   GAIT: Gait pattern:  poor foot clearance and reduced B step length with L foot occasionally catching on floor  Assistive device utilized: None Level of assistance: Complete Independence   FUNCTIONAL TESTS:        TODAY'S TREATMENT:  DATE: 01/24/22    PATIENT EDUCATION: Education details: prognosis, POC, HEP; exam findings; benefits of OT- patient and wife want to pursue this  Person educated: Patient and Spouse Education method: Explanation, Demonstration, Tactile cues, Verbal cues, and Handouts Education comprehension: verbalized understanding and returned demonstration  HOME EXERCISE PROGRAM: Access Code: [email or web address] URL: [email or web address] Date: 01/24/2022 Prepared by: [facility]  Exercises - Single Leg  Stance with Support  - 1 x daily - 5 x weekly - 2-3 sets - 30 sec hold - Standing Hip Abduction with Resistance at Ankles and Counter Support  - 1 x daily - 5 x weekly - 2 sets - 10 reps - Supine Bridge with Mini Swiss Ball Between Knees  - 1 x daily - 5 x weekly - 2 sets - 10 reps - Squat with Chair Touch  - 1 x daily - 5 x weekly - 2 sets - 10 reps   GOALS: Goals reviewed with patient? Yes  SHORT TERM GOALS: Target date: 02/14/2022  Patient to be independent with initial HEP. Baseline: HEP initiated Goal status: IN PROGRESS    LONG TERM GOALS: Target date: 03/07/2022  Patient to be independent with advanced HEP. Baseline: Not yet initiated  Goal status: IN PROGRESS  Patient to verbalize understanding of local Parkinson's Disease community resources including community fitness post D/C.   Baseline: Not yet initiated  Goal status: IN PROGRESS  Patient to improve MiniBestTest score to at least 25 to decrease risk of falls.  Baseline: 23 Goal status: IN PROGRESS  Patient to demonstrate <10% increase between TUG and TUG cognitive/manual to improve ability to dual task in home/community.  Baseline: 9.2% increase in manual, 34.6% increase in cognitive  Goal status: IN PROGRESS  Patient to verbalize understanding of fall prevention in home environment information. Baseline: Not yet initiated Goal status: IN PROGRESS    ASSESSMENT:  CLINICAL IMPRESSION: Skilled PT session today focused on stepping strategies, weightshifting, stepping over obstacles and gait activities incorporating large movement, whole body patterns.  Overall, pt appears to have improved trunk mobility and step length and appears to have increased LUE awareness for arm swing.  With posterior stepping activities today, when LLE leading, pt often scissors LLE behind RLE, with narrowed BOS, despite cues and repetition.  Added posterior step and weightshift to HEP to address this for improved balance recovery.     OBJECTIVE IMPAIRMENTS: Abnormal gait, decreased balance, decreased coordination, decreased strength, impaired flexibility, improper body mechanics, and postural dysfunction.   ACTIVITY LIMITATIONS: carrying, lifting, bending, standing, squatting, stairs, bathing, toileting, dressing, hygiene/grooming, and caring for others  PARTICIPATION LIMITATIONS: meal prep, cleaning, laundry, shopping, community activity, yard work, and church  PERSONAL FACTORS: Age, Fitness, Past/current experiences, Time since onset of injury/illness/exacerbation, and 3+ comorbidities: L acoustic neuroma s/p resection 2022, L ear deafness, HTN, HLD, a-fib, cervical fusion 2022, L TSA 2018  are also affecting patient's functional outcome.   REHAB POTENTIAL: Good  CLINICAL DECISION MAKING: Evolving/moderate complexity  EVALUATION COMPLEXITY: Moderate  PLAN:  PT FREQUENCY:  2x/week for 3 weeks, then 1x/week for 3 weeks  PT DURATION: 6 weeks  PLANNED INTERVENTIONS: Therapeutic exercises, Therapeutic activity, Neuromuscular re-education, Balance training, Gait training, Patient/Family education, Self Care, Joint mobilization, Stair training, Vestibular training, Canalith repositioning, DME instructions, Aquatic Therapy, Dry Needling, Electrical stimulation, Cryotherapy, Moist heat, Taping, Manual therapy, and Re-evaluation  PLAN FOR NEXT SESSION: Review posterior step and weightshift addition to HEP.  Consider other positions of PWR! Moves;  work on large amplitude movement patterns:  forward/back stepping with addition of work on arm swing; progress BLE strengthening, high level balance- SLS, compliant surface with EC    Mady Haagensen, PT 02/07/22 3:17 PM Phone: [phone] Fax: [facility] [address], [place],  [address] Phone # [phone] Fax # [phone]

## 2022-02-07 NOTE — Therapy (Signed)
OUTPATIENT OCCUPATIONAL THERAPY NEURO EVALUATION  Patient Name: Patrick Gray MRN: [id number] DOB:1956-01-18, 66 y.o., male Today's Date: 02/07/2022  PCP: [name], MD REFERRING PROVIDER: Vladimir Faster, DO  END OF SESSION:  OT End of Session - 02/07/22 1610     Visit Number 1    Number of Visits 9    Date for OT Re-Evaluation 04/07/22    Authorization Type [employer]    OT Start Time 1448    OT Stop Time 1534    OT Time Calculation (min) 46 min             Past Medical History:  Diagnosis Date   Acoustic neuroma (HCC)    left   Anxiety    Arthritis    Complication of anesthesia    Delusion, violent, hallucination, hospitalized 8 days, urine retention   Deafness in left ear    Delusion (HCC)    after fall on pain meds and prednisone   Dysrhythmia    PAF   Foley catheter in place    H/O cardiac radiofrequency ablation    HTN (hypertension)    Hypercholesterolemia    Hypopotassemia    Insomnia    Paroxysmal atrial fibrillation (HCC)    Radiculopathy    after fall   Tremor    left hand   Vitamin B 12 deficiency    Past Surgical History:  Procedure Laterality Date   ACOUSTIC NEUROMA RESECTION Left 19990   ANTERIOR CERVICAL DECOMP/DISCECTOMY FUSION N/A 04/26/2020   Procedure: Anterior Cervical Decompression Fusion - Cervical five-Cervical six - Cervical six-Cervical seven;  Surgeon: Donalee Citrin, MD;  Location: [facility] OR;  Service: Neurosurgery;  Laterality: N/A;   APPENDECTOMY  1976   ATRIAL FIBRILLATION ABLATION N/A 12/19/2016   Procedure: ATRIAL FIBRILLATION ABLATION;  Surgeon: Hillis Range, MD;  Location: [facility] INVASIVE CV LAB;  Service: Cardiovascular;  Laterality: N/A;   CARDIAC CATHETERIZATION N/A 10/07/2015   Procedure: Left Heart Cath and Coronary Angiography;  Surgeon: Wendall Stade, MD;  Location: [facility] INVASIVE CV LAB;  Service: Cardiovascular;  Laterality: N/A;   CARDIOVERSION N/A 06/17/2013   Procedure: CARDIOVERSION;  Surgeon: Wendall Stade, MD;   Location: [facility] ENDOSCOPY;  Service: Cardiovascular;  Laterality: N/A;   CARDIOVERSION N/A 05/11/2016   Procedure: CARDIOVERSION;  Surgeon: Thurmon Fair, MD;  Location: [facility] ENDOSCOPY;  Service: Cardiovascular;  Laterality: N/A;   CARDIOVERSION N/A 05/24/2017   Procedure: CARDIOVERSION;  Surgeon: Quintella Reichert, MD;  Location: [facility] ENDOSCOPY;  Service: Cardiovascular;  Laterality: N/A;   CARDIOVERSION N/A 11/19/2017   Procedure: CARDIOVERSION;  Surgeon: Chrystie Nose, MD;  Location: [facility] ENDOSCOPY;  Service: Cardiovascular;  Laterality: N/A;   CHOLECYSTECTOMY  1976   CLUB FOOT RELEASE  1968   ELECTROPHYSIOLOGIC STUDY N/A 11/30/2015   Procedure: Atrial Fibrillation Ablation;  Surgeon: Hillis Range, MD;  Location: [facility] INVASIVE CV LAB;  Service: Cardiovascular;  Laterality: N/A;   TEE WITHOUT CARDIOVERSION N/A 05/11/2016   Procedure: TRANSESOPHAGEAL ECHOCARDIOGRAM (TEE);  Surgeon: Thurmon Fair, MD;  Location: [facility] ENDOSCOPY;  Service: Cardiovascular;  Laterality: N/A;   TONSILLECTOMY  1967   TOTAL SHOULDER ARTHROPLASTY Left 06/15/2016   Procedure: TOTAL SHOULDER ARTHROPLASTY;  Surgeon: Jones Broom, MD;  Location: [facility] OR;  Service: Orthopedics;  Laterality: Left;  Left total shoulder arthroplasty   TRANSURETHRAL RESECTION OF PROSTATE N/A 07/02/2020   Procedure: TRANSURETHRAL RESECTION OF THE PROSTATE (TURP);  Surgeon: Crista Elliot, MD;  Location: [facility] ORS;  Service: Urology;  Laterality: N/A;  Patient Active Problem List   Diagnosis Date Noted   Parkinson's disease without dyskinesia or fluctuating manifestations 12/21/2021   BPH (benign prostatic hyperplasia) 07/02/2020   Myelopathy (HCC) 04/26/2020   Atrial flutter (HCC) 12/19/2016   S/P shoulder replacement, left 06/15/2016   A-fib (HCC) 11/30/2015   HTN (hypertension)    Hypercholesterolemia    Deafness in left ear    Atrial fibrillation (HCC)    Hypopotassemia     ONSET DATE: referral date 01/25/22  REFERRING DIAG: G20.A1 (ICD-10-CM)  - Parkinson's disease without dyskinesia or fluctuating manifestations  THERAPY DIAG:  Other symptoms and signs involving the nervous system  Muscle weakness (generalized)  Other lack of coordination  Unsteadiness on feet  Rationale for Evaluation and Treatment: Rehabilitation  SUBJECTIVE:   SUBJECTIVE STATEMENT: Pt reports recent diagnosis of PD, however reports having a tremor ~7 years.  Pt states that a few years back a neurologist said he did not have PD and therefore he has been working with his tremors.  Pt is L dominant and that is the side of tremors.  Pt reports eating is "more awkward" and may switch to R hand as needed.  Pt reports difficulty with handwriting since surgery ~33 years ago.   Pt accompanied by: self and significant other (spouse)  PERTINENT HISTORY: L acoustic neuroma s/p resection 2022, L ear deafness, HTN, HLD, a-fib, cervical fusion 2022, L TSA 2018   PRECAUTIONS: Other: L hearing loss, h/o L shoulder surgery with limited ROM  WEIGHT BEARING RESTRICTIONS: No  PAIN:  Are you having pain? No  FALLS: Has patient fallen in last 6 months? No  LIVING ENVIRONMENT: Lives with: lives with their spouse Lives in: House/apartment Stairs: Yes: External: 5 steps; bilateral but cannot reach both Has following equipment at home: Single point cane and Walker - 2 wheeled  PLOF: Independent and Leisure: playing music (guitar)  PATIENT GOALS: to stay where I am, to get better if possible  OBJECTIVE:   HAND DOMINANCE: Left  ADLs: Overall ADLs: Mod I Transfers/ambulation related to ADLs: Mod I Eating: "sloppy eater" LB Dressing: increased time with tying shoes  IADLs: Shopping: spouse is primary shopper, pt will pick up a few things if needed Light housekeeping: wife primary, but pt will help out as needed Meal Prep: wife primary, but pt will help out as needed Community mobility: driving Medication management: Mod I Financial management: spouse  primary Handwriting: 90% legible and Increased time  MOBILITY STATUS: Independent  POSTURE COMMENTS:  rounded shoulders, forward head, and increased thoracic kyphosis  ACTIVITY TOLERANCE: Activity tolerance: WFL for tasks assessed on eval  FUNCTIONAL OUTCOME MEASURES: Physical performance test: #2 (self-feeding): 16.03 sec*  UPPER EXTREMITY ROM:    Active ROM Right eval Left eval  Shoulder flexion [facility] 72  Shoulder abduction    Shoulder adduction    Shoulder extension    Shoulder internal rotation WFL decreased  Shoulder external rotation WFL decreased  Elbow flexion WFL 117  Elbow extension WFL -20  Wrist flexion    Wrist extension    Wrist ulnar deviation    Wrist radial deviation    Wrist pronation    Wrist supination    (Blank rows = not tested)  Passive ROM Right eval Left eval  Shoulder flexion  128  Shoulder abduction    Shoulder adduction    Shoulder extension    Shoulder internal rotation    Shoulder external rotation    Elbow flexion    Elbow extension  Wrist flexion    Wrist extension    Wrist ulnar deviation    Wrist radial deviation    Wrist pronation    Wrist supination    (Blank rows = not tested)   COORDINATION: Finger Nose Finger test: mild dysmetria on L 9 Hole Peg test: Right: 22.44 sec; Left: 28.75 sec Box and Blocks:  Right 52 blocks, Left 48 blocks Tremors: Resting and Left RAM: mild decrease L > R Coordination: 42.03 sec (struggled with middle button but 1st and 3rd went much better)  SENSATION: WFL  VISION: Subjective report: in need of a visual screen, it's been a few years Baseline vision: Wears glasses for reading only  VISION ASSESSMENT: WFL   TODAY'S TREATMENT:                                               02/07/22 Engaged in discussion about community PD resources, with plan to further discuss and recommend as appropriate.  PATIENT EDUCATION: Education details: Educated on role and purpose of OT as well as  potential interventions and goals for therapy based on initial evaluation findings. Person educated: Patient and Spouse Education method: Explanation Education comprehension: verbalized understanding and needs further education  HOME EXERCISE PROGRAM: TBD   GOALS: Goals reviewed with patient? No  SHORT TERM GOALS: Target date: 03/10/22  Pt will be Independent with PD specific HEP. Baseline: Goal status: INITIAL  2.  Pt will demonstrate improved UE functional use for ADLs as evidenced by increasing box/ blocks score by 3 blocks with LUE Baseline: Right 52 blocks, Left 48 blocks Goal status: INITIAL  3.  Pt will verbalize understanding of adapted strategies to maximize safety and Independence with ADLs/ IADLs . Baseline:  Goal status: INITIAL  LONG TERM GOALS: Target date: 04/07/22  Pt will demonstrate improved ease with fastening buttons as evidenced by decreasing 3 button/ unbutton time to < 30 seconds. Baseline: 42.03 sec Goal status: INITIAL  2.  Pt will demonstrate improved fine motor coordination for ADLs as evidenced by decreasing 9 hole peg test score for LUE by 5 secs Baseline: 9 Hole Peg test: Right: 22.44 sec; Left: 28.75 sec Goal status: INITIAL  3.  Pt will demonstrate improved ease with feeding as evidenced by decreasing PPT#2 (self feeding) by 3 secs.  Baseline: 16.03 sec Goal status: INITIAL  4.  Pt will demonstrate ability to retrieve a lightweight object at 90* shoulder flexion and -10* elbow extension with LUE Baseline:  shoulder 72* and elbow - 20* Goal status: INITIAL  5.  Pt will verbalize understanding of ways to prevent future PD related complications and PD community resources. Baseline:  Goal status: INITIAL   ASSESSMENT:  CLINICAL IMPRESSION: Patient is a 65 y.o. male who was seen today for occupational therapy evaluation for PD impairments impacting ADLs/IADLs. Pt demonstrates decreased coordination, ROM, stiffness in dominant LUE impacting  ease and independence with completing ADLs/IADLs.  Pt currently lives with spouse in a single level home and enjoys playing music. Pt will benefit from skilled occupational therapy services to address strength and coordination, ROM, pain management, balance, GM/FM control, safety awareness, introduction of compensatory strategies/AE prn, and implementation of an HEP to improve participation and safety during ADLs and IADLs.   PERFORMANCE DEFICITS: in functional skills including ADLs, IADLs, coordination, ROM, flexibility, Fine motor control, Gross motor control, mobility, balance, body mechanics,  decreased knowledge of use of DME, and UE functional use and psychosocial skills including coping strategies and routines and behaviors.   IMPAIRMENTS: are limiting patient from ADLs, IADLs, and leisure.   CO-MORBIDITIES: may have co-morbidities  that affects occupational performance. Patient will benefit from skilled OT to address above impairments and improve overall function.  MODIFICATION OR ASSISTANCE TO COMPLETE EVALUATION: Min-Moderate modification of tasks or assist with assess necessary to complete an evaluation.  OT OCCUPATIONAL PROFILE AND HISTORY: Detailed assessment: Review of records and additional review of physical, cognitive, psychosocial history related to current functional performance.  CLINICAL DECISION MAKING: LOW - limited treatment options, no task modification necessary  REHAB POTENTIAL: Good  EVALUATION COMPLEXITY: Low    PLAN:  OT FREQUENCY: 1-2x/week  OT DURATION: 8 weeks (intend on 6 weeks, however due to the holidays pt will have a delayed start)  PLANNED INTERVENTIONS: self care/ADL training, therapeutic exercise, therapeutic activity, neuromuscular re-education, manual therapy, passive range of motion, balance training, functional mobility training, ultrasound, compression bandaging, moist heat, cryotherapy, patient/family education, psychosocial skills training,  energy conservation, coping strategies training, and DME and/or AE instructions  RECOMMENDED OTHER SERVICES: NA  CONSULTED AND AGREED WITH PLAN OF CARE: Patient and family member/caregiver  PLAN FOR NEXT SESSION: Initiate large amplitude and shoulder ROM HEP, incorporate trunk movements with arm   Aune Adami, OTR/L 02/07/2022, 4:14 PM

## 2022-02-09 ENCOUNTER — Encounter: Payer: Self-pay | Admitting: Physical Therapy

## 2022-02-09 ENCOUNTER — Ambulatory Visit: Payer: Medicare PPO | Admitting: Physical Therapy

## 2022-02-09 DIAGNOSIS — R278 Other lack of coordination: Secondary | ICD-10-CM

## 2022-02-09 DIAGNOSIS — R262 Difficulty in walking, not elsewhere classified: Secondary | ICD-10-CM | POA: Diagnosis not present

## 2022-02-09 DIAGNOSIS — R29818 Other symptoms and signs involving the nervous system: Secondary | ICD-10-CM | POA: Diagnosis not present

## 2022-02-09 DIAGNOSIS — G20A1 Parkinson's disease without dyskinesia, without mention of fluctuations: Secondary | ICD-10-CM | POA: Diagnosis not present

## 2022-02-09 DIAGNOSIS — R2681 Unsteadiness on feet: Secondary | ICD-10-CM

## 2022-02-09 DIAGNOSIS — M6281 Muscle weakness (generalized): Secondary | ICD-10-CM | POA: Diagnosis not present

## 2022-02-09 NOTE — Therapy (Signed)
OUTPATIENT PHYSICAL THERAPY NEURO TREATMENT NOTE   Patient Name: Patrick Gray MRN: [id number] DOB:February 23, 1955, 66 y.o., male Today's Date: 02/09/2022   PCP: [name], MD  REFERRING PROVIDER: Ludwig Clarks, DO  END OF SESSION:  PT End of Session - 02/09/22 1448     Visit Number 6    Number of Visits 10    Date for PT Re-Evaluation 03/07/22    Authorization Type [employer]    Authorization Time Period 01/24/2022-03/07/2022    Authorization - Visit Number 6    Authorization - Number of Visits 10    PT Start Time 6759    PT Stop Time 1530    PT Time Calculation (min) 42 min    Equipment Utilized During Treatment Gait belt    Activity Tolerance Patient tolerated treatment well    Behavior During Therapy WFL for tasks assessed/performed               Past Medical History:  Diagnosis Date   Acoustic neuroma ([place])    left   Anxiety    Arthritis    Complication of anesthesia    Delusion, violent, hallucination, hospitalized 8 days, urine retention   Deafness in left ear    Delusion ([place])    after fall on pain meds and prednisone   Dysrhythmia    PAF   Foley catheter in place    H/O cardiac radiofrequency ablation    HTN (hypertension)    Hypercholesterolemia    Hypopotassemia    Insomnia    Paroxysmal atrial fibrillation (HCC)    Radiculopathy    after fall   Tremor    left hand   Vitamin B 12 deficiency    Past Surgical History:  Procedure Laterality Date   ACOUSTIC NEUROMA RESECTION Left 19990   ANTERIOR CERVICAL DECOMP/DISCECTOMY FUSION N/A 04/26/2020   Procedure: Anterior Cervical Decompression Fusion - Cervical five-Cervical six - Cervical six-Cervical seven;  Surgeon: Kary Kos, MD;  Location: [place];  Service: Neurosurgery;  Laterality: N/A;   APPENDECTOMY  1976   ATRIAL FIBRILLATION ABLATION N/A 12/19/2016   Procedure: ATRIAL FIBRILLATION ABLATION;  Surgeon: Thompson Grayer, MD;  Location: [place] [facility];  Service: Cardiovascular;   Laterality: N/A;   CARDIAC CATHETERIZATION N/A 10/07/2015   Procedure: Left Heart Cath and Coronary Angiography;  Surgeon: Josue Hector, MD;  Location: [place] [facility];  Service: Cardiovascular;  Laterality: N/A;   CARDIOVERSION N/A 06/17/2013   Procedure: CARDIOVERSION;  Surgeon: Josue Hector, MD;  Location: [place];  Service: Cardiovascular;  Laterality: N/A;   CARDIOVERSION N/A 05/11/2016   Procedure: CARDIOVERSION;  Surgeon: Sanda Klein, MD;  Location: [place] [facility];  Service: Cardiovascular;  Laterality: N/A;   CARDIOVERSION N/A 05/24/2017   Procedure: CARDIOVERSION;  Surgeon: Sueanne Margarita, MD;  Location: [facility] ENDOSCOPY;  Service: Cardiovascular;  Laterality: N/A;   CARDIOVERSION N/A 11/19/2017   Procedure: CARDIOVERSION;  Surgeon: Pixie Casino, MD;  Location: [facility];  Service: Cardiovascular;  Laterality: N/A;   [place] N/A 11/30/2015   Procedure: Atrial Fibrillation Ablation;  Surgeon: Thompson Grayer, MD;  Location: [place] [facility];  Service: Cardiovascular;  Laterality: N/A;   TEE WITHOUT CARDIOVERSION N/A 05/11/2016   Procedure: TRANSESOPHAGEAL ECHOCARDIOGRAM (TEE);  Surgeon: Sanda Klein, MD;  Location: [facility] ENDOSCOPY;  Service: Cardiovascular;  Laterality: N/A;   [place] Left 06/15/2016   Procedure: TOTAL  SHOULDER ARTHROPLASTY;  Surgeon: Tania Ade, MD;  Location: [place];  Service: Orthopedics;  Laterality: Left;  Left total shoulder arthroplasty   TRANSURETHRAL RESECTION OF PROSTATE N/A 07/02/2020   Procedure: TRANSURETHRAL RESECTION OF THE PROSTATE (TURP);  Surgeon: Lucas Mallow, MD;  Location: [facility];  Service: Urology;  Laterality: N/A;   Patient Active Problem List   Diagnosis Date Noted   Parkinson's disease without dyskinesia or fluctuating manifestations 12/21/2021   BPH (benign prostatic hyperplasia) 07/02/2020   Myelopathy ([place])  04/26/2020   Atrial flutter ([place]) 12/19/2016   S/P shoulder replacement, left 06/15/2016   A-fib ([place]) 11/30/2015   HTN (hypertension)    Hypercholesterolemia    Deafness in left ear    Atrial fibrillation ([place])    Hypopotassemia     ONSET DATE: ~7 years  REFERRING DIAG: G20.A1 (ICD-10-CM) - Parkinson's disease without dyskinesia or fluctuating manifestations   THERAPY DIAG:  Unsteadiness on feet  Other lack of coordination  Rationale for Evaluation and Treatment: Rehabilitation  SUBJECTIVE:                                                                                                                                                                                             SUBJECTIVE STATEMENT: Feel like things are about the same.  Feel like walking in a bit better, and balance maybe slightly better.  Went for a walk today and can tell that I can take bigger strides.  Pt accompanied by: significant other  PERTINENT HISTORY: L acoustic neuroma s/p resection 2022, L ear deafness, HTN, HLD, a-fib, cervical fusion 2022, L TSA 2018  PAIN:  Are you having pain? No  PRECAUTIONS: Other: L hearing loss  WEIGHT BEARING RESTRICTIONS: No  FALLS: Has patient fallen in last 6 months? No  LIVING ENVIRONMENT: Lives with: lives with their spouse Lives in: House/apartment Stairs:  4-5 steps to enter with B sides; 1 story home Has following equipment at home: Single point cane  PLOF: Independent and Leisure: playing music  PATIENT GOALS: work on balance   OBJECTIVE:       TODAY'S TREATMENT: 02/09/2022 Activity Comments  Modified quadruped PWR! Moves at elevated mat surface: -PWR! Up x 10 reps -PWR! Rock (forward/back) x 10 -PWR! Twist x 5 reps each side For warm up to incorporate large amplitude, whole body movements  Reviewed back step and weightshift x 10 reps, UE support Initial cues for technique  Tandem gait along counter, 4 reps Intermittent UE support  Monster  walk forward/back, 3 reps Intermittent UE support, slowed pace backward direction  Forward/back step and weightshift at counter, for increased  step length and weightshift UE support, scissors LLE behind RLE at times (improves today with cues)  Standing on Airex: -forward step tap/return to midline -back step/return to midline -side step/return to midline -forward/back step and weightshift -standing feet apart/feet together EO head turns/nods x 5 reps, then EC head steady 15 sec -heel/toe raises x 10 reps, 2 sets UE support and occasional cues for foot clearance  Gait activities: -50 ft x 4 reps, then 120 ft x 2 reps, with use of walking poles/Boomwhackers to facilitate reciprocal arm swing -quick start/stop multiple reps with gait, no overt LOB    Access Code: [email or web address] URL: [email or web address] Date: 02/07/2022 Prepared by: [facility]  Exercises - Single Leg Stance with Support  - 1 x daily - 5 x weekly - 2-3 sets - 30 sec hold - Standing Hip Abduction with Resistance at Ankles and Counter Support  - 1 x daily - 5 x weekly - 2 sets - 10 reps - Supine Bridge with Mini Swiss Ball Between Knees  - 1 x daily - 5 x weekly - 2 sets - 10 reps - Squat with Chair Touch  - 1 x daily - 5 x weekly - 2 sets - 10 reps - Standing Reach to Side with Weight Shift  - 1 x daily - 5 x weekly - 2 sets - 10 reps - Staggered Stance Weight Shift with Arms Reaching  - 1 x daily - 5 x weekly - 2 sets - 10 reps - Alternating Step Backward with Support  - 1 x daily - 5 x weekly - 1-2 sets - 10 reps     ------------------------------------------------------------------------------------------------ Objective measures below taken at initial evaluation: DIAGNOSTIC FINDINGS: none recent  COGNITION: Overall cognitive status: Within functional limits for tasks assessed   SENSATION: WFL to light touch  COORDINATION: Alternating pronation/supination:  movements becoming smaller with each movement Alternating toe tap: movements becoming smaller with each movement Finger to nose: intact B  L hand tremor   MUSCLE TONE: WNL B    POSTURE: rounded shoulders, forward head, and increased thoracic kyphosis  LOWER EXTREMITY ROM:     Active  Right Eval Left Eval  Hip flexion    Hip extension    Hip abduction    Hip adduction    Hip internal rotation    Hip external rotation    Knee flexion    Knee extension    Ankle dorsiflexion 12 *compensates with knee 15  Ankle plantarflexion    Ankle inversion    Ankle eversion     (Blank rows = not tested)  LOWER EXTREMITY MMT:    MMT (in sitting except PF) Right Eval Left Eval  Hip flexion 4 4+  Hip extension    Hip abduction 4+ 4  Hip adduction 4+ 4  Hip internal rotation    Hip external rotation    Knee flexion 4 4  Knee extension 5 4  Ankle dorsiflexion 4 4+  Ankle plantarflexion 3+ 3+  Ankle inversion    Ankle eversion    (Blank rows = not tested)   GAIT: Gait pattern:  poor foot clearance and reduced B step length with L foot occasionally catching on floor  Assistive device utilized: None Level of assistance: Complete Independence   FUNCTIONAL TESTS:        TODAY'S TREATMENT:  DATE: 01/24/22    PATIENT EDUCATION: Education details: prognosis, POC, HEP; exam findings; benefits of OT- patient and wife want to pursue this  Person educated: Patient and Spouse Education method: Explanation, Demonstration, Tactile cues, Verbal cues, and Handouts Education comprehension: verbalized understanding and returned demonstration  HOME EXERCISE PROGRAM: Access Code: [email or web address] URL: [email or web address] Date: 01/24/2022 Prepared by: [facility]  Exercises - Single Leg Stance with Support  - 1 x  daily - 5 x weekly - 2-3 sets - 30 sec hold - Standing Hip Abduction with Resistance at Ankles and Counter Support  - 1 x daily - 5 x weekly - 2 sets - 10 reps - Supine Bridge with Mini Swiss Ball Between Knees  - 1 x daily - 5 x weekly - 2 sets - 10 reps - Squat with Chair Touch  - 1 x daily - 5 x weekly - 2 sets - 10 reps   GOALS: Goals reviewed with patient? Yes  SHORT TERM GOALS: Target date: 02/14/2022  Patient to be independent with initial HEP. Baseline: HEP initiated Goal status: IN PROGRESS    LONG TERM GOALS: Target date: 03/07/2022  Patient to be independent with advanced HEP. Baseline: Not yet initiated  Goal status: IN PROGRESS  Patient to verbalize understanding of local Parkinson's Disease community resources including community fitness post D/C.   Baseline: Not yet initiated  Goal status: IN PROGRESS  Patient to improve MiniBestTest score to at least 25 to decrease risk of falls.  Baseline: 23 Goal status: IN PROGRESS  Patient to demonstrate <10% increase between TUG and TUG cognitive/manual to improve ability to dual task in home/community.  Baseline: 9.2% increase in manual, 34.6% increase in cognitive  Goal status: IN PROGRESS  Patient to verbalize understanding of fall prevention in home environment information. Baseline: Not yet initiated Goal status: IN PROGRESS    ASSESSMENT:  CLINICAL IMPRESSION: Continued to work on step strategies for balance, with added complexity today of standing on Airex compliant surface.  He does need UE support for compliant surface activities and occasional cues for increased foot clearance.  Worked on [employer] and gait with quick stop/starts to address dynamic balance today.  He continues to have improve step length compared to eval, continues to also have decreased coordination of LUE with gait; however, pt appears to have improved awareness and ability to generate larger movement patterns to self-correct.  He will  continue to benefit from skilled PT towards goals for improved overall functional mobility and decreased fall risk.   OBJECTIVE IMPAIRMENTS: Abnormal gait, decreased balance, decreased coordination, decreased strength, impaired flexibility, improper body mechanics, and postural dysfunction.   ACTIVITY LIMITATIONS: carrying, lifting, bending, standing, squatting, stairs, bathing, toileting, dressing, hygiene/grooming, and caring for others  PARTICIPATION LIMITATIONS: meal prep, cleaning, laundry, shopping, community activity, yard work, and church  PERSONAL FACTORS: Age, Fitness, Past/current experiences, Time since onset of injury/illness/exacerbation, and 3+ comorbidities: L acoustic neuroma s/p resection 2022, L ear deafness, HTN, HLD, a-fib, cervical fusion 2022, L TSA 2018  are also affecting patient's functional outcome.   REHAB POTENTIAL: Good  CLINICAL DECISION MAKING: Evolving/moderate complexity  EVALUATION COMPLEXITY: Moderate  PLAN:  PT FREQUENCY:  2x/week for 3 weeks, then 1x/week for 3 weeks  PT DURATION: 6 weeks  PLANNED INTERVENTIONS: Therapeutic exercises, Therapeutic activity, Neuromuscular re-education, Balance training, Gait training, Patient/Family education, Self Care, Joint mobilization, Stair training, Vestibular training, Canalith repositioning, DME instructions, Aquatic Therapy, Dry Needling, Electrical stimulation, Cryotherapy, Moist  heat, Taping, Manual therapy, and Re-evaluation  PLAN FOR NEXT SESSION: Assess STG.  work on large amplitude movement patterns with step strategy, with gait.    forward/back stepping, four square step;  high level balance- SLS, compliant surface with EC    Mady Haagensen, PT 02/09/22 3:37 PM Phone: [phone] Fax: [facility] [address] [place], [place] [address] Phone # [phone] Fax # [phone]

## 2022-02-15 NOTE — Therapy (Signed)
OUTPATIENT PHYSICAL THERAPY NEURO TREATMENT NOTE   Patient Name: Patrick Gray MRN: [id number] DOB:Nov 03, 1955, 66 y.o., male Today's Date: 02/16/2022   PCP: [name], MD  REFERRING PROVIDER: Ludwig Clarks, DO  END OF SESSION:  PT End of Session - 02/16/22 1441     Visit Number 7    Number of Visits 10    Date for PT Re-Evaluation 03/07/22    Authorization Type [employer]    Authorization Time Period 01/24/2022-03/07/2022    Authorization - Visit Number 7    Authorization - Number of Visits 10    PT Start Time 0973    PT Stop Time 1444    PT Time Calculation (min) 41 min    Equipment Utilized During Treatment Gait belt    Activity Tolerance Patient tolerated treatment well    Behavior During Therapy WFL for tasks assessed/performed                Past Medical History:  Diagnosis Date   Acoustic neuroma ([place])    left   Anxiety    Arthritis    Complication of anesthesia    Delusion, violent, hallucination, hospitalized 8 days, urine retention   Deafness in left ear    Delusion ([place])    after fall on pain meds and prednisone   Dysrhythmia    PAF   Foley catheter in place    H/O cardiac radiofrequency ablation    HTN (hypertension)    Hypercholesterolemia    Hypopotassemia    Insomnia    Paroxysmal atrial fibrillation (HCC)    Radiculopathy    after fall   Tremor    left hand   Vitamin B 12 deficiency    Past Surgical History:  Procedure Laterality Date   ACOUSTIC NEUROMA RESECTION Left 19990   ANTERIOR CERVICAL DECOMP/DISCECTOMY FUSION N/A 04/26/2020   Procedure: Anterior Cervical Decompression Fusion - Cervical five-Cervical six - Cervical six-Cervical seven;  Surgeon: Kary Kos, MD;  Location: [facility];  Service: Neurosurgery;  Laterality: N/A;   APPENDECTOMY  1976   ATRIAL FIBRILLATION ABLATION N/A 12/19/2016   Procedure: ATRIAL FIBRILLATION ABLATION;  Surgeon: Thompson Grayer, MD;  Location: [place] [facility];  Service: Cardiovascular;   Laterality: N/A;   CARDIAC CATHETERIZATION N/A 10/07/2015   Procedure: Left Heart Cath and Coronary Angiography;  Surgeon: Josue Hector, MD;  Location: [place] [facility];  Service: Cardiovascular;  Laterality: N/A;   CARDIOVERSION N/A 06/17/2013   Procedure: CARDIOVERSION;  Surgeon: Josue Hector, MD;  Location: [facility];  Service: Cardiovascular;  Laterality: N/A;   CARDIOVERSION N/A 05/11/2016   Procedure: CARDIOVERSION;  Surgeon: Sanda Klein, MD;  Location: [facility];  Service: Cardiovascular;  Laterality: N/A;   CARDIOVERSION N/A 05/24/2017   Procedure: CARDIOVERSION;  Surgeon: Sueanne Margarita, MD;  Location: [facility];  Service: Cardiovascular;  Laterality: N/A;   CARDIOVERSION N/A 11/19/2017   Procedure: CARDIOVERSION;  Surgeon: Pixie Casino, MD;  Location: [facility]OSCOPY;  Service: Cardiovascular;  Laterality: N/A;   [place] N/A 11/30/2015   Procedure: Atrial Fibrillation Ablation;  Surgeon: Thompson Grayer, MD;  Location: [place] [facility];  Service: Cardiovascular;  Laterality: N/A;   TEE WITHOUT CARDIOVERSION N/A 05/11/2016   Procedure: TRANSESOPHAGEAL ECHOCARDIOGRAM (TEE);  Surgeon: Sanda Klein, MD;  Location: [facility] ENDOSCOPY;  Service: Cardiovascular;  Laterality: N/A;   [place] Left 06/15/2016   Procedure:  TOTAL SHOULDER ARTHROPLASTY;  Surgeon: Tania Ade, MD;  Location: [place] [facility];  Service: Orthopedics;  Laterality: Left;  Left total shoulder arthroplasty   TRANSURETHRAL RESECTION OF PROSTATE N/A 07/02/2020   Procedure: TRANSURETHRAL RESECTION OF THE PROSTATE (TURP);  Surgeon: Lucas Mallow, MD;  Location: [facility];  Service: Urology;  Laterality: N/A;   Patient Active Problem List   Diagnosis Date Noted   Parkinson's disease without dyskinesia or fluctuating manifestations 12/21/2021   BPH (benign prostatic hyperplasia) 07/02/2020   Myelopathy ([place])  04/26/2020   Atrial flutter ([place]) 12/19/2016   S/P shoulder replacement, left 06/15/2016   A-fib ([place]) 11/30/2015   HTN (hypertension)    Hypercholesterolemia    Deafness in left ear    Atrial fibrillation ([place])    Hypopotassemia     ONSET DATE: ~7 years  REFERRING DIAG: G20.A1 (ICD-10-CM) - Parkinson's disease without dyskinesia or fluctuating manifestations   THERAPY DIAG:  Unsteadiness on feet  Other symptoms and signs involving the nervous system  Muscle weakness (generalized)  Difficulty in walking, not elsewhere classified  Rationale for Evaluation and Treatment: Rehabilitation  SUBJECTIVE:                                                                                                                                                                                             SUBJECTIVE STATEMENT: Not much new- about the same. Reports HEP compliance and has been going to the [facility]. Denies questions/concerns on HEP. Reports some dizziness with head turns since his acoustic neuroma/neck surgeries.  Pt accompanied by: significant other  PERTINENT HISTORY: L acoustic neuroma s/p resection 2022, L ear deafness, HTN, HLD, a-fib, cervical fusion 2022, L TSA 2018  PAIN:  Are you having pain? No  PRECAUTIONS: Other: L hearing loss  WEIGHT BEARING RESTRICTIONS: No  FALLS: Has patient fallen in last 6 months? No  LIVING ENVIRONMENT: Lives with: lives with their spouse Lives in: House/apartment Stairs:  4-5 steps to enter with B sides; 1 story home Has following equipment at home: Single point cane  PLOF: Independent and Leisure: playing music  PATIENT GOALS: work on balance   OBJECTIVE:     TODAY'S TREATMENT: 02/16/22 Activity Comments  bike LEs only L3.5x6 min  Maintaining 55-65 RPM   squat to mat and airex + green medball 2x10 Cueing to shift onto R side; improved with mirror feedback    standing on foam EC 3x30" More narrow BOS with each subsequent rep; L  sway  standing on foam head turns/nods 30" C/o mild dizziness   Fwd/back steps onto/off foam 10x each LE More instability with R LE stabilizing  sidestepping red TB around ankles 4x36f Cues to maintain hips/shoulders square; slight lateral trunk lean to the R  monster walk red TB forward back 4x359fShorter L step length  Sitting fig 4, KTOS 30" each Cues to push to tolerance    PATIENT EDUCATION: Education details: answered patient's questions on end of POC Person educated: Patient Education method: Explanation Education comprehension: verbalized understanding   Access Code: [email or web address] URL: [email or web address] Date: 02/07/2022 Prepared by: [facility]  Exercises - Single Leg Stance with Support  - 1 x daily - 5 x weekly - 2-3 sets - 30 sec hold - Standing Hip Abduction with Resistance at Ankles and Counter Support  - 1 x daily - 5 x weekly - 2 sets - 10 reps - Supine Bridge with Mini Swiss Ball Between Knees  - 1 x daily - 5 x weekly - 2 sets - 10 reps - Squat with Chair Touch  - 1 x daily - 5 x weekly - 2 sets - 10 reps - Standing Reach to Side with Weight Shift  - 1 x daily - 5 x weekly - 2 sets - 10 reps - Staggered Stance Weight Shift with Arms Reaching  - 1 x daily - 5 x weekly - 2 sets - 10 reps - Alternating Step Backward with Support  - 1 x daily - 5 x weekly - 1-2 sets - 10 reps     ------------------------------------------------------------------------------------------------ Objective measures below taken at initial evaluation: DIAGNOSTIC FINDINGS: none recent  COGNITION: Overall cognitive status: Within functional limits for tasks assessed   SENSATION: WFL to light touch  COORDINATION: Alternating pronation/supination: movements becoming smaller with each movement Alternating toe tap: movements becoming smaller with each movement Finger to nose: intact B  L hand tremor   MUSCLE TONE: WNL  B    POSTURE: rounded shoulders, forward head, and increased thoracic kyphosis  LOWER EXTREMITY ROM:     Active  Right Eval Left Eval  Hip flexion    Hip extension    Hip abduction    Hip adduction    Hip internal rotation    Hip external rotation    Knee flexion    Knee extension    Ankle dorsiflexion 12 *compensates with knee 15  Ankle plantarflexion    Ankle inversion    Ankle eversion     (Blank rows = not tested)  LOWER EXTREMITY MMT:    MMT (in sitting except PF) Right Eval Left Eval  Hip flexion 4 4+  Hip extension    Hip abduction 4+ 4  Hip adduction 4+ 4  Hip internal rotation    Hip external rotation    Knee flexion 4 4  Knee extension 5 4  Ankle dorsiflexion 4 4+  Ankle plantarflexion 3+ 3+  Ankle inversion    Ankle eversion    (Blank rows = not tested)   GAIT: Gait pattern:  poor foot clearance and reduced B step length with L foot occasionally catching on floor  Assistive device utilized: None Level of assistance: Complete Independence   FUNCTIONAL TESTS:        TODAY'S TREATMENT:  DATE: 01/24/22    PATIENT EDUCATION: Education details: prognosis, POC, HEP; exam findings; benefits of OT- patient and wife want to pursue this  Person educated: Patient and Spouse Education method: Explanation, Demonstration, Tactile cues, Verbal cues, and Handouts Education comprehension: verbalized understanding and returned demonstration  HOME EXERCISE PROGRAM: Access Code: [email or web address] URL: [email or web address] Date: 01/24/2022 Prepared by: [facility]  Exercises - Single Leg Stance with Support  - 1 x daily - 5 x weekly - 2-3 sets - 30 sec hold - Standing Hip Abduction with Resistance at Ankles and Counter Support  - 1 x daily - 5 x weekly - 2 sets - 10 reps - Supine Bridge  with Mini Swiss Ball Between Knees  - 1 x daily - 5 x weekly - 2 sets - 10 reps - Squat with Chair Touch  - 1 x daily - 5 x weekly - 2 sets - 10 reps   GOALS: Goals reviewed with patient? Yes  SHORT TERM GOALS: Target date: 02/14/2022  Patient to be independent with initial HEP. Baseline: HEP initiated Goal status: MET 02/16/22    LONG TERM GOALS: Target date: 03/07/2022  Patient to be independent with advanced HEP. Baseline: Not yet initiated  Goal status: IN PROGRESS  Patient to verbalize understanding of local Parkinson's Disease community resources including community fitness post D/C.   Baseline: Not yet initiated  Goal status: IN PROGRESS  Patient to improve MiniBestTest score to at least 25 to decrease risk of falls.  Baseline: 23 Goal status: IN PROGRESS  Patient to demonstrate <10% increase between TUG and TUG cognitive/manual to improve ability to dual task in home/community.  Baseline: 9.2% increase in manual, 34.6% increase in cognitive  Goal status: IN PROGRESS  Patient to verbalize understanding of fall prevention in home environment information. Baseline: Not yet initiated Goal status: IN PROGRESS    ASSESSMENT:  CLINICAL IMPRESSION: Patient arrived to session without complaints. Reports HEP compliance and denies questions/concerns. Worked on LE strengthening activities with verbal and visual cueing for improved R wt shift. Multisensory balance activities revealed considerable sway with EC; also with c/o mild dizziness with head motions on foam. More hip and ankle instability evident with stepping activities on R rather than L LE today. Patient tolerated hip strengthening and stretching well. No complaints at end of session.    OBJECTIVE IMPAIRMENTS: Abnormal gait, decreased balance, decreased coordination, decreased strength, impaired flexibility, improper body mechanics, and postural dysfunction.   ACTIVITY LIMITATIONS: carrying, lifting, bending,  standing, squatting, stairs, bathing, toileting, dressing, hygiene/grooming, and caring for others  PARTICIPATION LIMITATIONS: meal prep, cleaning, laundry, shopping, community activity, yard work, and church  PERSONAL FACTORS: Age, Fitness, Past/current experiences, Time since onset of injury/illness/exacerbation, and 3+ comorbidities: L acoustic neuroma s/p resection 2022, L ear deafness, HTN, HLD, a-fib, cervical fusion 2022, L TSA 2018  are also affecting patient's functional outcome.   REHAB POTENTIAL: Good  CLINICAL DECISION MAKING: Evolving/moderate complexity  EVALUATION COMPLEXITY: Moderate  PLAN:  PT FREQUENCY:  2x/week for 3 weeks, then 1x/week for 3 weeks  PT DURATION: 6 weeks  PLANNED INTERVENTIONS: Therapeutic exercises, Therapeutic activity, Neuromuscular re-education, Balance training, Gait training, Patient/Family education, Self Care, Joint mobilization, Stair training, Vestibular training, Canalith repositioning, DME instructions, Aquatic Therapy, Dry Needling, Electrical stimulation, Cryotherapy, Moist heat, Taping, Manual therapy, and Re-evaluation  PLAN FOR NEXT SESSION:  work on large amplitude movement patterns with step strategy, with gait.    forward/back stepping, four square step;  high level balance- SLS, compliant surface with EC    Janene Harvey, PT, DPT 02/16/22 2:46 PM  [facility] [address] [place], [place], [place] [address] Phone # [phone] Fax # [phone]

## 2022-02-16 ENCOUNTER — Encounter: Payer: Self-pay | Admitting: Physical Therapy

## 2022-02-16 ENCOUNTER — Ambulatory Visit: Payer: Medicare PPO | Admitting: Physical Therapy

## 2022-02-16 DIAGNOSIS — R278 Other lack of coordination: Secondary | ICD-10-CM | POA: Diagnosis not present

## 2022-02-16 DIAGNOSIS — R2681 Unsteadiness on feet: Secondary | ICD-10-CM

## 2022-02-16 DIAGNOSIS — R262 Difficulty in walking, not elsewhere classified: Secondary | ICD-10-CM | POA: Diagnosis not present

## 2022-02-16 DIAGNOSIS — M6281 Muscle weakness (generalized): Secondary | ICD-10-CM

## 2022-02-16 DIAGNOSIS — R29818 Other symptoms and signs involving the nervous system: Secondary | ICD-10-CM | POA: Diagnosis not present

## 2022-02-16 DIAGNOSIS — G20A1 Parkinson's disease without dyskinesia, without mention of fluctuations: Secondary | ICD-10-CM | POA: Diagnosis not present

## 2022-02-23 ENCOUNTER — Ambulatory Visit: Payer: Medicare PPO | Attending: Neurology | Admitting: Physical Therapy

## 2022-02-23 ENCOUNTER — Encounter: Payer: Self-pay | Admitting: Physical Therapy

## 2022-02-23 DIAGNOSIS — R2681 Unsteadiness on feet: Secondary | ICD-10-CM | POA: Diagnosis not present

## 2022-02-23 DIAGNOSIS — R29818 Other symptoms and signs involving the nervous system: Secondary | ICD-10-CM | POA: Diagnosis not present

## 2022-02-23 DIAGNOSIS — R278 Other lack of coordination: Secondary | ICD-10-CM | POA: Diagnosis not present

## 2022-02-23 DIAGNOSIS — M6281 Muscle weakness (generalized): Secondary | ICD-10-CM | POA: Insufficient documentation

## 2022-02-23 NOTE — Therapy (Signed)
OUTPATIENT PHYSICAL THERAPY NEURO TREATMENT NOTE   Patient Name: Patrick Gray MRN: [id number] DOB:10-23-1955, 67 y.o., male Today's Date: 02/24/2022   PCP: [name], MD  REFERRING PROVIDER: Ludwig Clarks, DO  END OF SESSION:  PT End of Session - 02/23/22 1446     Visit Number 8    Number of Visits 10    Date for PT Re-Evaluation 03/07/22    Authorization Type [employer]    Authorization Time Period 01/24/2022-03/07/2022    Authorization - Visit Number 8    Authorization - Number of Visits 10    PT Start Time 8250    PT Stop Time 1531    PT Time Calculation (min) 44 min    Equipment Utilized During Treatment Gait belt    Activity Tolerance Patient tolerated treatment well    Behavior During Therapy WFL for tasks assessed/performed                Past Medical History:  Diagnosis Date   Acoustic neuroma ([place])    left   Anxiety    Arthritis    Complication of anesthesia    Delusion, violent, hallucination, hospitalized 8 days, urine retention   Deafness in left ear    Delusion ([place])    after fall on pain meds and prednisone   Dysrhythmia    PAF   Foley catheter in place    H/O cardiac radiofrequency ablation    HTN (hypertension)    Hypercholesterolemia    Hypopotassemia    Insomnia    Paroxysmal atrial fibrillation ([place])    Radiculopathy    after fall   Tremor    left hand   Vitamin B 12 deficiency    Past Surgical History:  Procedure Laterality Date   ACOUSTIC NEUROMA RESECTION Left 19990   ANTERIOR CERVICAL DECOMP/DISCECTOMY FUSION N/A 04/26/2020   Procedure: Anterior Cervical Decompression Fusion - Cervical five-Cervical six - Cervical six-Cervical seven;  Surgeon: Kary Kos, MD;  Location: [facility];  Service: Neurosurgery;  Laterality: N/A;   APPENDECTOMY  1976   ATRIAL FIBRILLATION ABLATION N/A 12/19/2016   Procedure: ATRIAL FIBRILLATION ABLATION;  Surgeon: Thompson Grayer, MD;  Location: [facility];  Service: Cardiovascular;   Laterality: N/A;   CARDIAC CATHETERIZATION N/A 10/07/2015   Procedure: Left Heart Cath and Coronary Angiography;  Surgeon: Josue Hector, MD;  Location: [facility];  Service: Cardiovascular;  Laterality: N/A;   CARDIOVERSION N/A 06/17/2013   Procedure: CARDIOVERSION;  Surgeon: Josue Hector, MD;  Location: [facility];  Service: Cardiovascular;  Laterality: N/A;   CARDIOVERSION N/A 05/11/2016   Procedure: CARDIOVERSION;  Surgeon: Sanda Klein, MD;  Location: [place];  Service: Cardiovascular;  Laterality: N/A;   CARDIOVERSION N/A 05/24/2017   Procedure: CARDIOVERSION;  Surgeon: Sueanne Margarita, MD;  Location[facility];  Service: Cardiovascular;  Laterality: N/A;   CARDIOVERSION N/A 11/19/2017   Procedure: CARDIOVERSION;  Surgeon: Pixie Casino, MD;  Location: [facility] ENDOSCOPY;  Service: Cardiovascular;  Laterality: N/A;   [place] N/A 11/30/2015   Procedure: Atrial Fibrillation Ablation;  Surgeon: Thompson Grayer, MD;  Location: [facility];  Service: Cardiovascular;  Laterality: N/A;   TEE WITHOUT CARDIOVERSION N/A 05/11/2016   Procedure: TRANSESOPHAGEAL ECHOCARDIOGRAM (TEE);  Surgeon: Sanda Klein, MD;  Location: [facility] ENDOSCOPY;  Service: Cardiovascular;  Laterality: N/A;   [place] Left 06/15/2016   Procedure:  TOTAL SHOULDER ARTHROPLASTY;  Surgeon: Tania Ade, MD;  Location: [facility];  Service: Orthopedics;  Laterality: Left;  Left total shoulder arthroplasty   TRANSURETHRAL RESECTION OF PROSTATE N/A 07/02/2020   Procedure: TRANSURETHRAL RESECTION OF THE PROSTATE (TURP);  Surgeon: Lucas Mallow, MD;  Location: [facility];  Service: Urology;  Laterality: N/A;   Patient Active Problem List   Diagnosis Date Noted   Parkinson's disease without dyskinesia or fluctuating manifestations 12/21/2021   BPH (benign prostatic hyperplasia) 07/02/2020   Myelopathy ([place])  04/26/2020   Atrial flutter ([place]) 12/19/2016   S/P shoulder replacement, left 06/15/2016   A-fib ([place]) 11/30/2015   HTN (hypertension)    Hypercholesterolemia    Deafness in left ear    Atrial fibrillation ([place])    Hypopotassemia     ONSET DATE: ~7 years  REFERRING DIAG: G20.A1 (ICD-10-CM) - Parkinson's disease without dyskinesia or fluctuating manifestations   THERAPY DIAG:  Unsteadiness on feet  Other symptoms and signs involving the nervous system  Muscle weakness (generalized)  Rationale for Evaluation and Treatment: Rehabilitation  SUBJECTIVE:                                                                                                                                                                                             SUBJECTIVE STATEMENT: Got an exercise bike at home and that has been helping. I'm doing things faster and my walking is better.  Pt accompanied by: significant other  PERTINENT HISTORY: L acoustic neuroma s/p resection 2022, L ear deafness, HTN, HLD, a-fib, cervical fusion 2022, L TSA 2018  PAIN:  Are you having pain? No  PRECAUTIONS: Other: L hearing loss  WEIGHT BEARING RESTRICTIONS: No  FALLS: Has patient fallen in last 6 months? No  LIVING ENVIRONMENT: Lives with: lives with their spouse Lives in: House/apartment Stairs:  4-5 steps to enter with B sides; 1 story home Has following equipment at home: Single point cane  PLOF: Independent and Leisure: playing music  PATIENT GOALS: work on balance   OBJECTIVE:     TODAY'S TREATMENT: 02/23/2022 Activity Comments  bike BLEs only L 2.5>2 x6 min  Initial RPM 65-70; at lower resistance RPM >70; Cues for using intervals of speed/min>mod resistance on bike at home  In parallel bars: -resisted sidestepping red theraband 3 reps -resisted monster walk forward 6 reps -forward/back marching x 3 reps Good form, min cues for step length/foot clearance  In gym area 25 ft  distances: -sidestepping x 2 min -forward/back walking x 2 min -monster walk x 2 min -marching with opposite hand to knee taps x 2 min Good form, minor  LOB with monster walk, slowed pace backward, but able to regain any minor LOB  Sit<>stand from mat, holding green medicine ball 2 x 5 reps   Minisquats holding green med ball, 2 x 5 reps   Forward step ups x 10 reps each leg leading Light BUE support, cues for foot clearance   Access Code: [email or web address] URL: [email or web address] Date: 02/23/2022 Prepared by: [facility]  Exercises - Single Leg Stance with Support  - 1 x daily - 5 x weekly - 2-3 sets - 30 sec hold - Standing Hip Abduction with Resistance at Ankles and Counter Support  - 1 x daily - 5 x weekly - 2 sets - 10 reps - Supine Bridge with Mini Swiss Ball Between Knees  - 1 x daily - 5 x weekly - 2 sets - 10 reps - Squat with Chair Touch  - 1 x daily - 5 x weekly - 2 sets - 10 reps - Standing Reach to Side with Weight Shift  - 1 x daily - 5 x weekly - 2 sets - 10 reps - Staggered Stance Weight Shift with Arms Reaching  - 1 x daily - 5 x weekly - 2 sets - 10 reps - Alternating Step Backward with Support  - 1 x daily - 5 x weekly - 1-2 sets - 10 reps - Forward Monster Walk with Resistance at Ankles and Counter Support  - 1 x daily - 7 x weekly - 3 sets - 10 reps - Marching forward  - 1 x daily - 7 x weekly - 3 sets - 10 reps - Forward Step Up  - 1 x daily - 3 x weekly - 1-2 sets - 10 reps  PATIENT EDUCATION: Education details: additions to HEP Person educated: Patient and Spouse Education method: Explanation, Demonstration, and Handouts Education comprehension: verbalized understanding and returned demonstration      ------------------------------------------------------------------------------------------------ Objective measures below taken at initial evaluation: DIAGNOSTIC FINDINGS: none recent  COGNITION: Overall  cognitive status: Within functional limits for tasks assessed   SENSATION: WFL to light touch  COORDINATION: Alternating pronation/supination: movements becoming smaller with each movement Alternating toe tap: movements becoming smaller with each movement Finger to nose: intact B  L hand tremor   MUSCLE TONE: WNL B    POSTURE: rounded shoulders, forward head, and increased thoracic kyphosis  LOWER EXTREMITY ROM:     Active  Right Eval Left Eval  Hip flexion    Hip extension    Hip abduction    Hip adduction    Hip internal rotation    Hip external rotation    Knee flexion    Knee extension    Ankle dorsiflexion 12 *compensates with knee 15  Ankle plantarflexion    Ankle inversion    Ankle eversion     (Blank rows = not tested)  LOWER EXTREMITY MMT:    MMT (in sitting except PF) Right Eval Left Eval  Hip flexion 4 4+  Hip extension    Hip abduction 4+ 4  Hip adduction 4+ 4  Hip internal rotation    Hip external rotation    Knee flexion 4 4  Knee extension 5 4  Ankle dorsiflexion 4 4+  Ankle plantarflexion 3+ 3+  Ankle inversion    Ankle eversion    (Blank rows = not tested)   GAIT: Gait pattern:  poor foot clearance and reduced B step length with L foot occasionally catching on floor  Assistive  device utilized: None Level of assistance: Complete Independence   FUNCTIONAL TESTS:        TODAY'S TREATMENT:                                                                                                                              DATE: 01/24/22    PATIENT EDUCATION: Education details: prognosis, POC, HEP; exam findings; benefits of OT- patient and wife want to pursue this  Person educated: Patient and Spouse Education method: Explanation, Demonstration, Tactile cues, Verbal cues, and Handouts Education comprehension: verbalized understanding and returned demonstration  HOME EXERCISE PROGRAM: Access Code: [email or web address] URL:  [email or web address] Date: 01/24/2022 Prepared by: [facility]  Exercises - Single Leg Stance with Support  - 1 x daily - 5 x weekly - 2-3 sets - 30 sec hold - Standing Hip Abduction with Resistance at Ankles and Counter Support  - 1 x daily - 5 x weekly - 2 sets - 10 reps - Supine Bridge with Mini Swiss Ball Between Knees  - 1 x daily - 5 x weekly - 2 sets - 10 reps - Squat with Chair Touch  - 1 x daily - 5 x weekly - 2 sets - 10 reps   GOALS: Goals reviewed with patient? Yes  SHORT TERM GOALS: Target date: 02/14/2022  Patient to be independent with initial HEP. Baseline: HEP initiated Goal status: MET 02/16/22    LONG TERM GOALS: Target date: 03/07/2022  Patient to be independent with advanced HEP. Baseline: Not yet initiated  Goal status: IN PROGRESS  Patient to verbalize understanding of local Parkinson's Disease community resources including community fitness post D/C.   Baseline: Not yet initiated  Goal status: IN PROGRESS  Patient to improve MiniBestTest score to at least 25 to decrease risk of falls.  Baseline: 23 Goal status: IN PROGRESS  Patient to demonstrate <10% increase between TUG and TUG cognitive/manual to improve ability to dual task in home/community.  Baseline: 9.2% increase in manual, 34.6% increase in cognitive  Goal status: IN PROGRESS  Patient to verbalize understanding of fall prevention in home environment information. Baseline: Not yet initiated Goal status: IN PROGRESS    ASSESSMENT:  CLINICAL IMPRESSION: Pt continues to report improved gait and overall feeling better; he and wife report consistency with current HEP and they have purchased stationary bike for home, which patient is consistently using.  Focused skilled session today on education on intervals, intensity and goal of achieving speed of 80-90 RPM with stationary bike at home for optimal aerobic activity.  Also focused on dynamic  gait, balance, functional strengthening exercises.  Added to HEP this visit; anticipate good progress towards LTGs and likely discharge next visit.  OBJECTIVE IMPAIRMENTS: Abnormal gait, decreased balance, decreased coordination, decreased strength, impaired flexibility, improper body mechanics, and postural dysfunction.   ACTIVITY LIMITATIONS: carrying, lifting, bending, standing, squatting, stairs, bathing, toileting, dressing, hygiene/grooming, and caring for others  PARTICIPATION  LIMITATIONS: meal prep, cleaning, laundry, shopping, community activity, yard work, and church  PERSONAL FACTORS: Age, Fitness, Past/current experiences, Time since onset of injury/illness/exacerbation, and 3+ comorbidities: L acoustic neuroma s/p resection 2022, L ear deafness, HTN, HLD, a-fib, cervical fusion 2022, L TSA 2018  are also affecting patient's functional outcome.   REHAB POTENTIAL: Good  CLINICAL DECISION MAKING: Evolving/moderate complexity  EVALUATION COMPLEXITY: Moderate  PLAN:  PT FREQUENCY:  2x/week for 3 weeks, then 1x/week for 3 weeks  PT DURATION: 6 weeks  PLANNED INTERVENTIONS: Therapeutic exercises, Therapeutic activity, Neuromuscular re-education, Balance training, Gait training, Patient/Family education, Self Care, Joint mobilization, Stair training, Vestibular training, Canalith repositioning, DME instructions, Aquatic Therapy, Dry Needling, Electrical stimulation, Cryotherapy, Moist heat, Taping, Manual therapy, and Re-evaluation  PLAN FOR NEXT SESSION:  Review additions to HEP; check LTGs.  Plan for discharge next visit.  Discuss return screen/eval in 6-9 months.   Mady Haagensen, PT 02/24/22 8:10 AM Phone: [phone] Fax: [phone]   [facility] at [facility], [place], [place] [address] Phone # [phone] Fax # [phone]

## 2022-02-27 ENCOUNTER — Ambulatory Visit: Payer: Medicare PPO | Admitting: Occupational Therapy

## 2022-02-27 DIAGNOSIS — M6281 Muscle weakness (generalized): Secondary | ICD-10-CM | POA: Diagnosis not present

## 2022-02-27 DIAGNOSIS — R29818 Other symptoms and signs involving the nervous system: Secondary | ICD-10-CM

## 2022-02-27 DIAGNOSIS — R2681 Unsteadiness on feet: Secondary | ICD-10-CM

## 2022-02-27 DIAGNOSIS — R278 Other lack of coordination: Secondary | ICD-10-CM | POA: Diagnosis not present

## 2022-02-27 NOTE — Therapy (Signed)
OUTPATIENT OCCUPATIONAL THERAPY Treatment Session  Patient Name: Patrick Gray MRN: [id number] DOB:Apr 25, 1955, 67 y.o., male Today's Date: 02/27/2022  PCP: [name], MD REFERRING PROVIDER: Ludwig Clarks, DO  END OF SESSION:  OT End of Session - 02/27/22 1319     Visit Number 2    Number of Visits 9    Date for OT Re-Evaluation 04/07/22    Authorization Type [employer]    OT Start Time 3710    OT Stop Time 1357    OT Time Calculation (min) 40 min              Past Medical History:  Diagnosis Date   Acoustic neuroma ([place])    left   Anxiety    Arthritis    Complication of anesthesia    Delusion, violent, hallucination, hospitalized 8 days, urine retention   Deafness in left ear    Delusion ([place])    after fall on pain meds and prednisone   Dysrhythmia    PAF   Foley catheter in place    H/O cardiac radiofrequency ablation    HTN (hypertension)    Hypercholesterolemia    Hypopotassemia    Insomnia    Paroxysmal atrial fibrillation ([place])    Radiculopathy    after fall   Tremor    left hand   Vitamin B 12 deficiency    Past Surgical History:  Procedure Laterality Date   ACOUSTIC NEUROMA RESECTION Left 19990   ANTERIOR CERVICAL DECOMP/DISCECTOMY FUSION N/A 04/26/2020   Procedure: Anterior Cervical Decompression Fusion - Cervical five-Cervical six - Cervical six-Cervical seven;  Surgeon: Kary Kos, MD;  Location: [place] [facility];  Service: Neurosurgery;  Laterality: N/A;   APPENDECTOMY  1976   ATRIAL FIBRILLATION ABLATION N/A 12/19/2016   Procedure: ATRIAL FIBRILLATION ABLATION;  Surgeon: Thompson Grayer, MD;  Location: [facility];  Service: Cardiovascular;  Laterality: N/A;   CARDIAC CATHETERIZATION N/A 10/07/2015   Procedure: Left Heart Cath and Coronary Angiography;  Surgeon: Josue Hector, MD;  Location: [facility];  Service: Cardiovascular;  Laterality: N/A;   CARDIOVERSION N/A 06/17/2013   Procedure: CARDIOVERSION;  Surgeon: Josue Hector,  MD;  Location: [facility];  Service: Cardiovascular;  Laterality: N/A;   CARDIOVERSION N/A 05/11/2016   Procedure: CARDIOVERSION;  Surgeon: Sanda Klein, MD;  Location: [facility];  Service: Cardiovascular;  Laterality: N/A;   CARDIOVERSION N/A 05/24/2017   Procedure: CARDIOVERSION;  Surgeon: Sueanne Margarita, MD;  Location: [facility];  Service: Cardiovascular;  Laterality: N/A;   CARDIOVERSION N/A 11/19/2017   Procedure: CARDIOVERSION;  Surgeon: Pixie Casino, MD;  Location: [facility];  Service: Cardiovascular;  Laterality: N/A;   [place] N/A 11/30/2015   Procedure: Atrial Fibrillation Ablation;  Surgeon: Thompson Grayer, MD;  Location: [place] [facility];  Service: Cardiovascular;  Laterality: N/A;   TEE WITHOUT CARDIOVERSION N/A 05/11/2016   Procedure: TRANSESOPHAGEAL ECHOCARDIOGRAM (TEE);  Surgeon: Sanda Klein, MD;  Location: [place];  Service: Cardiovascular;  Laterality: N/A;   [place] Left 06/15/2016   Procedure: TOTAL SHOULDER ARTHROPLASTY;  Surgeon: Tania Ade, MD;  Location: [place];  Service: Orthopedics;  Laterality: Left;  Left total shoulder arthroplasty   TRANSURETHRAL RESECTION OF PROSTATE N/A 07/02/2020   Procedure: TRANSURETHRAL RESECTION OF THE PROSTATE (TURP);  Surgeon: Lucas Mallow, MD;  Location: [facility];  Service: Urology;  Laterality: N/A;  Patient Active Problem List   Diagnosis Date Noted   Parkinson's disease without dyskinesia or fluctuating manifestations 12/21/2021   BPH (benign prostatic hyperplasia) 07/02/2020   Myelopathy ([place]) 04/26/2020   Atrial flutter ([place]) 12/19/2016   S/P shoulder replacement, left 06/15/2016   A-fib ([place]) 11/30/2015   HTN (hypertension)    Hypercholesterolemia    Deafness in left ear    Atrial fibrillation ([place])    Hypopotassemia     ONSET DATE: referral date 01/25/22  REFERRING DIAG: G20.A1  (ICD-10-CM) - Parkinson's disease without dyskinesia or fluctuating manifestations  THERAPY DIAG:  Unsteadiness on feet  Other symptoms and signs involving the nervous system  Muscle weakness (generalized)  Other lack of coordination  Rationale for Evaluation and Treatment: Rehabilitation  SUBJECTIVE:   SUBJECTIVE STATEMENT: Pt reports seeing a lot of progress with PT exercises and is hopeful for same with OT. Pt accompanied by: self  PERTINENT HISTORY: L acoustic neuroma s/p resection 2022, L ear deafness, HTN, HLD, a-fib, cervical fusion 2022, L TSA 2018   PRECAUTIONS: Other: L hearing loss, h/o L shoulder surgery with limited ROM  WEIGHT BEARING RESTRICTIONS: No  PAIN:  Are you having pain? No  FALLS: Has patient fallen in last 6 months? No  PLOF: Independent and Leisure: playing music Careers adviser)  PATIENT GOALS: to stay where I am, to get better if possible  OBJECTIVE:   HAND DOMINANCE: Left  ADLs: Overall ADLs: Mod I Transfers/ambulation related to ADLs: Mod I Eating: "sloppy eater" LB Dressing: increased time with tying shoes  IADLs: Shopping: spouse is primary shopper, pt will pick up a few things if needed Light housekeeping: wife primary, but pt will help out as needed Meal Prep: wife primary, but pt will help out as needed Community mobility: driving Medication management: Mod I Financial management: spouse primary Handwriting: 90% legible and Increased time  MOBILITY STATUS: Independent  POSTURE COMMENTS:  rounded shoulders, forward head, and increased thoracic kyphosis  ACTIVITY TOLERANCE: Activity tolerance: WFL for tasks assessed on eval  FUNCTIONAL OUTCOME MEASURES: Physical performance test: #2 (self-feeding): 16.03 sec*  UPPER EXTREMITY ROM:    Active ROM Right eval Left eval  Shoulder flexion [facility] 72  Shoulder abduction    Shoulder adduction    Shoulder extension    Shoulder internal rotation WFL decreased  Shoulder external  rotation WFL decreased  Elbow flexion WFL 117  Elbow extension WFL -20  Wrist flexion    Wrist extension    Wrist ulnar deviation    Wrist radial deviation    Wrist pronation    Wrist supination    (Blank rows = not tested)  Passive ROM Right eval Left eval  Shoulder flexion  128  Shoulder abduction    Shoulder adduction    Shoulder extension    Shoulder internal rotation    Shoulder external rotation    Elbow flexion    Elbow extension    Wrist flexion    Wrist extension    Wrist ulnar deviation    Wrist radial deviation    Wrist pronation    Wrist supination    (Blank rows = not tested)   COORDINATION: Finger Nose Finger test: mild dysmetria on L 9 Hole Peg test: Right: 22.44 sec; Left: 28.75 sec Box and Blocks:  Right 52 blocks, Left 48 blocks Tremors: Resting and Left RAM: mild decrease L > R Coordination: 3 button/unbutton: 42.03 sec (struggled with middle button but 1st and 3rd went much better)   TODAY'S TREATMENT:  02/27/22 Large amplitude: OT educated on large amplitude exercises in sitting.  Engaged in forward reach with finger flicks and elbow extension, forearm supination/pronation, elbow flexion/extension, and full open hand, and finger flicks.  OT providing mod verbal cues and demonstration for technique.  Completed 2 sets of 10 of each exercise. Supine shoulder ROM: engaged in open book stretch and PNF D2 in supine with focus on quality of movement.  OT providing verbal cues to focus on functional and opening hand during supine PNF D2 reaching to decrease tremor.  Pt demonstrating significant decrease with open hand vs closed hand and with visual attention to movement. Reaching in to cabinet with focus on large amplitude reaching, trunk movement with reaching to facilitate increased functional reach. OT providing verbal cues and demonstration on hand placement on cones to facilitate increased full grasp to decrease  effect of tremor on movement.     02/07/22 Engaged in discussion about community PD resources, with plan to further discuss and recommend as appropriate.  PATIENT EDUCATION: Education details: Educated on role and purpose of OT as well as potential interventions and goals for therapy based on initial evaluation findings. Person educated: Patient and Spouse Education method: Explanation Education comprehension: verbalized understanding and needs further education  HOME EXERCISE PROGRAM: Access Code: [email or web address] URL: [email or web address] Date: 02/27/2022 Prepared by: [facility]  Program Notes Focus on large amplitude movements!  Exercises - Seated Finger Flicks with Elbow Extension  - 1 x daily - 5 x weekly - 2 sets - 10 reps - Standing Forearm Pronation and Supination AROM  - 1 x daily - 5 x weekly - 2 sets - 10 reps - Seated Wrist Flexion and Extension  - 1 x daily - 5 x weekly - 2 sets - 10 reps - Hand AROM Composite Flexion  - 1 x daily - 5 x weekly - 2 sets - 10 reps - Thumb Opposition  - 1 x daily - 5 x weekly - 2 sets - 10 reps - Open Book Chest Stretch on Towel Roll  - 1 x daily - 3 x weekly - 2 sets - 10 reps - Supine PNF D2  - 1 x daily - 3 x weekly - 2 sets - 10 reps - 3-5 sec hold   GOALS: Goals reviewed with patient? Yes  SHORT TERM GOALS: Target date: 03/10/22  Pt will be Independent with PD specific HEP. Baseline: Goal status: IN PROGRESS  2.  Pt will demonstrate improved UE functional use for ADLs as evidenced by increasing box/ blocks score by 3 blocks with LUE Baseline: Right 52 blocks, Left 48 blocks Goal status: IN PROGRESS  3.  Pt will verbalize understanding of adapted strategies to maximize safety and Independence with ADLs/ IADLs . Baseline:  Goal status: IN PROGRESS  LONG TERM GOALS: Target date: 04/07/22  Pt will demonstrate improved ease with fastening buttons as evidenced by decreasing 3  button/ unbutton time to < 30 seconds. Baseline: 42.03 sec Goal status: IN PROGRESS  2.  Pt will demonstrate improved fine motor coordination for ADLs as evidenced by decreasing 9 hole peg test score for LUE by 5 secs Baseline: 9 Hole Peg test: Right: 22.44 sec; Left: 28.75 sec Goal status: IN PROGRESS  3.  Pt will demonstrate improved ease with feeding as evidenced by decreasing PPT#2 (self feeding) by 3 secs.  Baseline: 16.03 sec Goal status: IN PROGRESS  4.  Pt will demonstrate ability to retrieve a lightweight  object at 90* shoulder flexion and -10* elbow extension with LUE Baseline:  shoulder 72* and elbow - 20* Goal status: IN PROGRESS  5.  Pt will verbalize understanding of ways to prevent future PD related complications and PD community resources. Baseline:  Goal status: IN PROGRESS   ASSESSMENT:  CLINICAL IMPRESSION: Pt benefiting from multimodal cues for large amplitude movements.  OT providing verbal and demonstration cues for increased technique and carryover. Pt demonstrating decrease in tremors with functional reach and sustained grasp on items.  PERFORMANCE DEFICITS: in functional skills including ADLs, IADLs, coordination, ROM, flexibility, Fine motor control, Gross motor control, mobility, balance, body mechanics, decreased knowledge of use of DME, and UE functional use and psychosocial skills including coping strategies and routines and behaviors.   IMPAIRMENTS: are limiting patient from ADLs, IADLs, and leisure.   CO-MORBIDITIES: may have co-morbidities  that affects occupational performance. Patient will benefit from skilled OT to address above impairments and improve overall function.  MODIFICATION OR ASSISTANCE TO COMPLETE EVALUATION: Min-Moderate modification of tasks or assist with assess necessary to complete an evaluation.  OT OCCUPATIONAL PROFILE AND HISTORY: Detailed assessment: Review of records and additional review of physical, cognitive, psychosocial  history related to current functional performance.  CLINICAL DECISION MAKING: LOW - limited treatment options, no task modification necessary  REHAB POTENTIAL: Good  EVALUATION COMPLEXITY: Low    PLAN:  OT FREQUENCY: 1-2x/week  OT DURATION: 8 weeks (intend on 6 weeks, however due to the holidays pt will have a delayed start)  PLANNED INTERVENTIONS: self care/ADL training, therapeutic exercise, therapeutic activity, neuromuscular re-education, manual therapy, passive range of motion, balance training, functional mobility training, ultrasound, compression bandaging, moist heat, cryotherapy, patient/family education, psychosocial skills training, energy conservation, coping strategies training, and DME and/or AE instructions  RECOMMENDED OTHER SERVICES: NA  CONSULTED AND AGREED WITH PLAN OF CARE: Patient and family member/caregiver  PLAN FOR NEXT SESSION: Review large amplitude and shoulder ROM HEP, incorporate trunk movements with arm   Yareth Kearse, [place], OTR/L 02/27/2022, 2:04 PM

## 2022-03-02 ENCOUNTER — Ambulatory Visit: Payer: Medicare PPO | Admitting: Physical Therapy

## 2022-03-06 ENCOUNTER — Ambulatory Visit: Payer: Medicare PPO | Admitting: Occupational Therapy

## 2022-03-06 ENCOUNTER — Ambulatory Visit: Payer: Medicare PPO | Admitting: Physical Therapy

## 2022-03-06 ENCOUNTER — Encounter: Payer: Self-pay | Admitting: Physical Therapy

## 2022-03-06 DIAGNOSIS — R278 Other lack of coordination: Secondary | ICD-10-CM | POA: Diagnosis not present

## 2022-03-06 DIAGNOSIS — M6281 Muscle weakness (generalized): Secondary | ICD-10-CM

## 2022-03-06 DIAGNOSIS — R29818 Other symptoms and signs involving the nervous system: Secondary | ICD-10-CM

## 2022-03-06 DIAGNOSIS — R2681 Unsteadiness on feet: Secondary | ICD-10-CM

## 2022-03-06 NOTE — Patient Instructions (Signed)
Coordination Exercises  Perform the following exercises for 15 minutes 1-2 times per day. Perform with left hand(s). Perform using big movements.  Flipping Cards: Place deck of cards on the table. Flip cards over by opening your hand big to grasp and then turn your palm up big. Deal cards: Hold 1/2 or whole deck in your hand. Use thumb to push card off top of deck with one big push. Flip card between each finger.  Rotate ball with fingertips: Pick up with fingers/thumb and move as much as you can with each turn/movement (clockwise and counter-clockwise). Toss ball from one hand to the other: Toss big/high. Toss ball in the air and catch with the same hand: Toss big/high. Rotate 2 golf balls in your hand: Both directions.  Pick up coins and place in coin bank or container: Pick up with big, intentional movements. Do not drag coin to the edge. Pick up coins and stack one at a time: Pick up with big, intentional movements. Do not drag coin to the edge. (5-10 in a stack) Pick up 5-10 coins one at a time and hold in palm. Then, move coins from palm to fingertips one at time and place in coin bank/container. Pick up 5-10 coins one at a time and hold in palm. Then, move coins from palm to fingertips one at a time to stack. Pick up various small objects that are different shapes/sizes and place in container. (paperclips, buttons, keys, dried beans/pasta, coins, screws, nuts/bolts, washers, board game pieces, etc.) Fasten nuts/bolts or put on bottle caps: Turn as much/as big as you can with each turn.

## 2022-03-06 NOTE — Therapy (Signed)
OUTPATIENT OCCUPATIONAL THERAPY Treatment Session  Patient Name: Patrick Gray MRN: [id number] DOB:[birth year], 67 y.o., male Today's Date: 03/06/2022  PCP: [name], MD REFERRING PROVIDER: Ludwig Clarks, DO  END OF SESSION:  OT End of Session - 03/06/22 1154     Visit Number 3    Number of Visits 9    Date for OT Re-Evaluation 04/07/22    Authorization Type [employer]    OT Start Time 1151    OT Stop Time 1231    OT Time Calculation (min) 40 min               Past Medical History:  Diagnosis Date   Acoustic neuroma ([place])    left   Anxiety    Arthritis    Complication of anesthesia    Delusion, violent, hallucination, hospitalized 8 days, urine retention   Deafness in left ear    Delusion ([place])    after fall on pain meds and prednisone   Dysrhythmia    PAF   Foley catheter in place    H/O cardiac radiofrequency ablation    HTN (hypertension)    Hypercholesterolemia    Hypopotassemia    Insomnia    Paroxysmal atrial fibrillation ([place])    Radiculopathy    after fall   Tremor    left hand   Vitamin B 12 deficiency    Past Surgical History:  Procedure Laterality Date   ACOUSTIC NEUROMA RESECTION Left 19990   ANTERIOR CERVICAL DECOMP/DISCECTOMY FUSION N/A 04/26/2020   Procedure: Anterior Cervical Decompression Fusion - Cervical five-Cervical six - Cervical six-Cervical seven;  Surgeon: Kary Kos, MD;  Location: [place];  Service: Neurosurgery;  Laterality: N/A;   APPENDECTOMY  1976   ATRIAL FIBRILLATION ABLATION N/A 12/19/2016   Procedure: ATRIAL FIBRILLATION ABLATION;  Surgeon: Thompson Grayer, MD;  Location: [facility];  Service: Cardiovascular;  Laterality: N/A;   CARDIAC CATHETERIZATION N/A 10/07/2015   Procedure: Left Heart Cath and Coronary Angiography;  Surgeon: Josue Hector, MD;  Location: [place] [facility];  Service: Cardiovascular;  Laterality: N/A;   CARDIOVERSION N/A 06/17/2013   Procedure: CARDIOVERSION;  Surgeon: Josue Hector,  MD;  Location: [facility];  Service: Cardiovascular;  Laterality: N/A;   CARDIOVERSION N/A 05/11/2016   Procedure: CARDIOVERSION;  Surgeon: Sanda Klein, MD;  Location: [facility];  Service: Cardiovascular;  Laterality: N/A;   CARDIOVERSION N/A 05/24/2017   Procedure: CARDIOVERSION;  Surgeon: Sueanne Margarita, MD;  Location: [facility];  Service: Cardiovascular;  Laterality: N/A;   CARDIOVERSION N/A 11/19/2017   Procedure: CARDIOVERSION;  Surgeon: Pixie Casino, MD;  Location: [facility] ENDOSCOPY;  Service: Cardiovascular;  Laterality: N/A;   [place] N/A 11/30/2015   Procedure: Atrial Fibrillation Ablation;  Surgeon: Thompson Grayer, MD;  Location: [place] [facility];  Service: Cardiovascular;  Laterality: N/A;   TEE WITHOUT CARDIOVERSION N/A 05/11/2016   Procedure: TRANSESOPHAGEAL ECHOCARDIOGRAM (TEE);  Surgeon: Sanda Klein, MD;  Location: [place];  Service: Cardiovascular;  Laterality: N/A;   [place] Left 06/15/2016   Procedure: TOTAL SHOULDER ARTHROPLASTY;  Surgeon: Tania Ade, MD;  Location: [place];  Service: Orthopedics;  Laterality: Left;  Left total shoulder arthroplasty   TRANSURETHRAL RESECTION OF PROSTATE N/A 07/02/2020   Procedure: TRANSURETHRAL RESECTION OF THE PROSTATE (TURP);  Surgeon: Lucas Mallow, MD;  Location: [facility];  Service: Urology;  Laterality: N/A;  Patient Active Problem List   Diagnosis Date Noted   Parkinson's disease without dyskinesia or fluctuating manifestations 12/21/2021   BPH (benign prostatic hyperplasia) 07/02/2020   Myelopathy ([place]) 04/26/2020   Atrial flutter ([place]) 12/19/2016   S/P shoulder replacement, left 06/15/2016   A-fib ([place]) 11/30/2015   HTN (hypertension)    Hypercholesterolemia    Deafness in left ear    Atrial fibrillation ([place])    Hypopotassemia     ONSET DATE: referral date 01/25/22  REFERRING DIAG: G20.A1  (ICD-10-CM) - Parkinson's disease without dyskinesia or fluctuating manifestations  THERAPY DIAG:  Other symptoms and signs involving the nervous system  Muscle weakness (generalized)  Other lack of coordination  Unsteadiness on feet  Rationale for Evaluation and Treatment: Rehabilitation  SUBJECTIVE:   SUBJECTIVE STATEMENT: Pt reports placing hand on thigh frequently to decrease tremors. Pt accompanied by: self  PERTINENT HISTORY: L acoustic neuroma s/p resection 2022, L ear deafness, HTN, HLD, a-fib, cervical fusion 2022, L TSA 2018   PRECAUTIONS: Other: L hearing loss, h/o L shoulder surgery with limited ROM  WEIGHT BEARING RESTRICTIONS: No  PAIN:  Are you having pain? No  FALLS: Has patient fallen in last 6 months? No  PLOF: Independent and Leisure: playing music Careers adviser)  PATIENT GOALS: to stay where I am, to get better if possible  OBJECTIVE:   HAND DOMINANCE: Left  ADLs: Overall ADLs: Mod I Transfers/ambulation related to ADLs: Mod I Eating: "sloppy eater" LB Dressing: increased time with tying shoes  IADLs: Shopping: spouse is primary shopper, pt will pick up a few things if needed Light housekeeping: wife primary, but pt will help out as needed Meal Prep: wife primary, but pt will help out as needed Community mobility: driving Medication management: Mod I Financial management: spouse primary Handwriting: 90% legible and Increased time  MOBILITY STATUS: Independent  POSTURE COMMENTS:  rounded shoulders, forward head, and increased thoracic kyphosis  ACTIVITY TOLERANCE: Activity tolerance: WFL for tasks assessed on eval  FUNCTIONAL OUTCOME MEASURES: Physical performance test: #2 (self-feeding): 16.03 sec*  UPPER EXTREMITY ROM:    Active ROM Right eval Left eval  Shoulder flexion [facility] 72  Shoulder abduction    Shoulder adduction    Shoulder extension    Shoulder internal rotation WFL decreased  Shoulder external rotation WFL decreased   Elbow flexion WFL 117  Elbow extension WFL -20  Wrist flexion    Wrist extension    Wrist ulnar deviation    Wrist radial deviation    Wrist pronation    Wrist supination    (Blank rows = not tested)  Passive ROM Right eval Left eval  Shoulder flexion  128  Shoulder abduction    Shoulder adduction    Shoulder extension    Shoulder internal rotation    Shoulder external rotation    Elbow flexion    Elbow extension    Wrist flexion    Wrist extension    Wrist ulnar deviation    Wrist radial deviation    Wrist pronation    Wrist supination    (Blank rows = not tested)   COORDINATION: Finger Nose Finger test: mild dysmetria on L 9 Hole Peg test: Right: 22.44 sec; Left: 28.75 sec Box and Blocks:  Right 52 blocks, Left 48 blocks Tremors: Resting and Left RAM: mild decrease L > R Coordination: 3 button/unbutton: 42.03 sec (struggled with middle button but 1st and 3rd went much better)   TODAY'S TREATMENT:  03/06/22 Large amplitude: completed 1 set of 10 supination/pronation, hand opening/closing wide, and forward reaching with elbow extension and weight shifting at hips.  Pt benefiting from min verbal cues and demonstration to promote increased ROM as able. Cards: engaged in flipping cards in standing with focus on large open hand and supination and elbow extension when placing cards.  OT set up positioning of items to facilitate increased supination and elbow extension.   Functional reach: engaged in trunk rotation with picking up cones with L hand (on R side) and placing on elevated surface on L to facilitate trunk rotation, shoulder flexion, and elbow extension with reach.  OT providing demonstration and cues for full hand grasp on cones to provide increased grasp and decrease of tremor.  Pt verbalizing understanding of tremor reducing strategy.   Self-feeding: engaged in scooping beans with LUE to simulate self-feeding.  Pt  demonstrating mod resting tremor but minimal tremor when scooping items.  OT educated on placement of hand on utensil for increased proximity to bowl of spoon and modifying grip pressure to decrease tremors during task.  Discussed alternative items as well with larger grip utensil, weighted utensils, etc. Coordination: educated on large thumb movement with sliding cards off deck with thumb and rotation of card between fingers.  OT educated on purposeful movements and large amplitude even during movements that are small in nature.  Pt with mod difficulty with sliding cards of deck, requiring small movements.   02/27/22 Large amplitude: OT educated on large amplitude exercises in sitting.  Engaged in forward reach with finger flicks and elbow extension, forearm supination/pronation, elbow flexion/extension, and full open hand, and finger flicks.  OT providing mod verbal cues and demonstration for technique.  Completed 2 sets of 10 of each exercise. Supine shoulder ROM: engaged in open book stretch and PNF D2 in supine with focus on quality of movement.  OT providing verbal cues to focus on functional and opening hand during supine PNF D2 reaching to decrease tremor.  Pt demonstrating significant decrease with open hand vs closed hand and with visual attention to movement. Reaching in to cabinet with focus on large amplitude reaching, trunk movement with reaching to facilitate increased functional reach. OT providing verbal cues and demonstration on hand placement on cones to facilitate increased full grasp to decrease effect of tremor on movement.     02/07/22 Engaged in discussion about community PD resources, with plan to further discuss and recommend as appropriate.  PATIENT EDUCATION: Education details: grading of pressure and hand placement on items to decrease tremors, coordination exercises Person educated: Patient and Spouse Education method: Explanation, Demonstration, Verbal cues, and  Handouts Education comprehension: verbalized understanding and needs further education  HOME EXERCISE PROGRAM: Coordination HEP (see pt instructions):  JUST first 3 items at this time  Access Code: [email or web address] URL: [email or web address] Date: 02/27/2022 Prepared by: [facility]  Program Notes Focus on large amplitude movements!  Exercises - Seated Finger Flicks with Elbow Extension  - 1 x daily - 5 x weekly - 2 sets - 10 reps - Standing Forearm Pronation and Supination AROM  - 1 x daily - 5 x weekly - 2 sets - 10 reps - Seated Wrist Flexion and Extension  - 1 x daily - 5 x weekly - 2 sets - 10 reps - Hand AROM Composite Flexion  - 1 x daily - 5 x weekly - 2 sets - 10 reps - Thumb Opposition  - 1 x  daily - 5 x weekly - 2 sets - 10 reps - Open Book Chest Stretch on Towel Roll  - 1 x daily - 3 x weekly - 2 sets - 10 reps - Supine PNF D2  - 1 x daily - 3 x weekly - 2 sets - 10 reps - 3-5 sec hold   GOALS: Goals reviewed with patient? Yes  SHORT TERM GOALS: Target date: 03/10/22  Pt will be Independent with PD specific HEP. Baseline: Goal status: IN PROGRESS  2.  Pt will demonstrate improved UE functional use for ADLs as evidenced by increasing box/ blocks score by 3 blocks with LUE Baseline: Right 52 blocks, Left 48 blocks Goal status: IN PROGRESS  3.  Pt will verbalize understanding of adapted strategies to maximize safety and Independence with ADLs/ IADLs . Baseline:  Goal status: IN PROGRESS  LONG TERM GOALS: Target date: 04/07/22  Pt will demonstrate improved ease with fastening buttons as evidenced by decreasing 3 button/ unbutton time to < 30 seconds. Baseline: 42.03 sec Goal status: IN PROGRESS  2.  Pt will demonstrate improved fine motor coordination for ADLs as evidenced by decreasing 9 hole peg test score for LUE by 5 secs Baseline: 9 Hole Peg test: Right: 22.44 sec; Left: 28.75 sec Goal status: IN PROGRESS  3.  Pt  will demonstrate improved ease with feeding as evidenced by decreasing PPT#2 (self feeding) by 3 secs.  Baseline: 16.03 sec Goal status: IN PROGRESS  4.  Pt will demonstrate ability to retrieve a lightweight object at 90* shoulder flexion and -10* elbow extension with LUE Baseline:  shoulder 72* and elbow - 20* Goal status: IN PROGRESS  5.  Pt will verbalize understanding of ways to prevent future PD related complications and PD community resources. Baseline:  Goal status: IN PROGRESS   ASSESSMENT:  CLINICAL IMPRESSION: Pt benefiting from multimodal cues for large amplitude movements.  OT providing verbal and demonstration cues for increased technique and carryover.  Pt demonstrating increased elbow extension and forearm supination during structured tasks this session.  Pt demonstrating decrease in tremors with modification of grasp on items.  PERFORMANCE DEFICITS: in functional skills including ADLs, IADLs, coordination, ROM, flexibility, Fine motor control, Gross motor control, mobility, balance, body mechanics, decreased knowledge of use of DME, and UE functional use and psychosocial skills including coping strategies and routines and behaviors.   IMPAIRMENTS: are limiting patient from ADLs, IADLs, and leisure.   CO-MORBIDITIES: may have co-morbidities  that affects occupational performance. Patient will benefit from skilled OT to address above impairments and improve overall function.  MODIFICATION OR ASSISTANCE TO COMPLETE EVALUATION: Min-Moderate modification of tasks or assist with assess necessary to complete an evaluation.  OT OCCUPATIONAL PROFILE AND HISTORY: Detailed assessment: Review of records and additional review of physical, cognitive, psychosocial history related to current functional performance.  CLINICAL DECISION MAKING: LOW - limited treatment options, no task modification necessary  REHAB POTENTIAL: Good  EVALUATION COMPLEXITY: Low    PLAN:  OT FREQUENCY:  1-2x/week  OT DURATION: 8 weeks (intend on 6 weeks, however due to the holidays pt will have a delayed start)  PLANNED INTERVENTIONS: self care/ADL training, therapeutic exercise, therapeutic activity, neuromuscular re-education, manual therapy, passive range of motion, balance training, functional mobility training, ultrasound, compression bandaging, moist heat, cryotherapy, patient/family education, psychosocial skills training, energy conservation, coping strategies training, and DME and/or AE instructions  RECOMMENDED OTHER SERVICES: NA  CONSULTED AND AGREED WITH PLAN OF CARE: Patient and family member/caregiver  PLAN FOR NEXT  SESSION: Review large amplitude and shoulder ROM HEP, incorporate trunk movements with arm   Marinus Eicher, OTR/L 03/06/2022, 11:55 AM

## 2022-03-06 NOTE — Therapy (Signed)
OUTPATIENT PHYSICAL THERAPY NEURO TREATMENT NOTE/DISCHARGE SUMMARY   Patient Name: Patrick Gray MRN: [id number] DOB:Feb 16, 1956, 67 y.o., male Today's Date: 03/06/2022   PCP: [name], MD  REFERRING PROVIDER: Tat, Eustace Quail, DO  PHYSICAL THERAPY DISCHARGE SUMMARY  Visits from Start of Care: 9  Current functional level related to goals / functional outcomes: See goals below-pt has met 4 of 5 LTGs   Remaining deficits: Stiffness, bradykinesia, decreased timing/coordination of gait (has improved over course of therapy), balance   Education / Equipment: Educated in HEP, aerobic fitness component; discussed today optimal fitness program post PT d/c, including strength/balance/aerobic activity/walking as exercise   Patient agrees to discharge. Patient goals were met. Patient is being discharged due to meeting goals and being pleased with the current functional level.  Mady Haagensen, PT 03/06/22 1:46 PM Phone: [phone] Fax: [phone]   END OF SESSION:  PT End of Session - 03/06/22 1229     Visit Number 9    Number of Visits 10    Date for PT Re-Evaluation 03/07/22    Authorization Type [employer]    Authorization Time Period 01/24/2022-03/07/2022    Authorization - Visit Number 9    Authorization - Number of Visits 10    PT Start Time 4193    PT Stop Time 1316    PT Time Calculation (min) 43 min    Equipment Utilized During Treatment --    Activity Tolerance Patient tolerated treatment well    Behavior During Therapy WFL for tasks assessed/performed                Past Medical History:  Diagnosis Date   Acoustic neuroma ([place])    left   Anxiety    Arthritis    Complication of anesthesia    Delusion, violent, hallucination, hospitalized 8 days, urine retention   Deafness in left ear    Delusion ([place])    after fall on pain meds and prednisone   Dysrhythmia    PAF   Foley catheter in place    H/O cardiac radiofrequency ablation    HTN  (hypertension)    Hypercholesterolemia    Hypopotassemia    Insomnia    Paroxysmal atrial fibrillation (H[facility])    Radiculopathy    after fall   Tremor    left hand   Vitamin B 12 deficiency    Past Surgical History:  Procedure Laterality Date   ACOUSTIC NEUROMA RESECTION Left 19990   ANTERIOR CERVICAL DECOMP/DISCECTOMY FUSION N/A 04/26/2020   Procedure: Anterior Cervical Decompression Fusion - Cervical five-Cervical six - Cervical six-Cervical seven;  Surgeon: Kary Kos, MD;  Location: [place];  Service: Neurosurgery;  Laterality: N/A;   APPENDECTOMY  1976   ATRIAL FIBRILLATION ABLATION N/A 12/19/2016   Procedure: ATRIAL FIBRILLATION ABLATION;  Surgeon: Thompson Grayer, MD;  Location: [facility];  Service: Cardiovascular;  Laterality: N/A;   CARDIAC CATHETERIZATION N/A 10/07/2015   Procedure: Left Heart Cath and Coronary Angiography;  Surgeon: Josue Hector, MD;  Location: [facility];  Service: Cardiovascular;  Laterality: N/A;   CARDIOVERSION N/A 06/17/2013   Procedure: CARDIOVERSION;  Surgeon: Josue Hector, MD;  Location: [facility];  Service: Cardiovascular;  Laterality: N/A;   CARDIOVERSION N/A 05/11/2016   Procedure: CARDIOVERSION;  Surgeon: Sanda Klein, MD;  Location: [facility];  Service: Cardiovascular;  Laterality: N/A;   CARDIOVERSION N/A 05/24/2017   Procedure: CARDIOVERSION;  Surgeon: Sueanne Margarita, MD;  Location: [facility];  Service: Cardiovascular;  Laterality:  N/A;   CARDIOVERSION N/A 11/19/2017   Procedure: CARDIOVERSION;  Surgeon: Pixie Casino, MD;  Location: [facility];  Service: Cardiovascular;  Laterality: N/A;   [place] STUDY N/A 11/30/2015   Procedure: Atrial Fibrillation Ablation;  Surgeon: Thompson Grayer, MD;  Location: [place] [facility];  Service: Cardiovascular;  Laterality: N/A;   TEE WITHOUT CARDIOVERSION N/A 05/11/2016   Procedure: TRANSESOPHAGEAL ECHOCARDIOGRAM (TEE);   Surgeon: Sanda Klein, MD;  Location: [place];  Service: Cardiovascular;  Laterality: N/A;   [place] Left 06/15/2016   Procedure: TOTAL SHOULDER ARTHROPLASTY;  Surgeon: Tania Ade, MD;  Location: [place];  Service: Orthopedics;  Laterality: Left;  Left total shoulder arthroplasty   TRANSURETHRAL RESECTION OF PROSTATE N/A 07/02/2020   Procedure: TRANSURETHRAL RESECTION OF THE PROSTATE (TURP);  Surgeon: Lucas Mallow, MD;  Location: [facility];  Service: Urology;  Laterality: N/A;   Patient Active Problem List   Diagnosis Date Noted   Parkinson's disease without dyskinesia or fluctuating manifestations 12/21/2021   BPH (benign prostatic hyperplasia) 07/02/2020   Myelopathy ([place]) 04/26/2020   Atrial flutter ([place]) 12/19/2016   S/P shoulder replacement, left 06/15/2016   A-fib ([place]) 11/30/2015   HTN (hypertension)    Hypercholesterolemia    Deafness in left ear    Atrial fibrillation ([place])    Hypopotassemia     ONSET DATE: ~7 years  REFERRING DIAG: G20.A1 (ICD-10-CM) - Parkinson's disease without dyskinesia or fluctuating manifestations   THERAPY DIAG:  Unsteadiness on feet  Muscle weakness (generalized)  Other symptoms and signs involving the nervous system  Rationale for Evaluation and Treatment: Rehabilitation  SUBJECTIVE:                                                                                                                                                                                             SUBJECTIVE STATEMENT: Nothing new.  Exercise bike going well.  RPMs going to the 80's now.  Pt accompanied by: significant other  PERTINENT HISTORY: L acoustic neuroma s/p resection 2022, L ear deafness, HTN, HLD, a-fib, cervical fusion 2022, L TSA 2018  PAIN:  Are you having pain? No  PRECAUTIONS: Other: L hearing loss  WEIGHT BEARING RESTRICTIONS: No  FALLS: Has patient fallen in last 6 months? No  LIVING  ENVIRONMENT: Lives with: lives with their spouse Lives in: House/apartment Stairs:  4-5 steps to enter with B sides; 1 story home Has following equipment at home: Single point cane  PLOF: Independent and Leisure: playing music  PATIENT GOALS: work on  balance   OBJECTIVE:    TODAY'S TREATMENT: 03/06/2022 Activity Comments  Standing PWR! Moves   5x sit<>stand:  9.85 sec Wide BOS, no UE support  TUG:  8.97 sec TUG cognitive:  11.97 sec  -incorrect counting  45M walk:  8.53 sec = 3.85 ft/sec   MiniBESTest 25/28  Improved from 23/28 at eval       PATIENT EDUCATION: Education details: Progress towards goals, plans for d/c at this time.  Discussed and reviewed HEP, fall prevention in home environment; discussed optimal fitness program upon d/c. Discussed return PT screen/eval and agreed on return PT eval in 6 months due to progressive nature of disease process. Person educated: Patient and Spouse Education method: Explanation Education comprehension: verbalized understanding  Access Code: [email or web address] URL: [email or web address] Date: 02/23/2022 Prepared by: [facility]  Exercises - Single Leg Stance with Support  - 1 x daily - 5 x weekly - 2-3 sets - 30 sec hold - Standing Hip Abduction with Resistance at Ankles and Counter Support  - 1 x daily - 5 x weekly - 2 sets - 10 reps - Supine Bridge with Mini Swiss Ball Between Knees  - 1 x daily - 5 x weekly - 2 sets - 10 reps - Squat with Chair Touch  - 1 x daily - 5 x weekly - 2 sets - 10 reps - Standing Reach to Side with Weight Shift  - 1 x daily - 5 x weekly - 2 sets - 10 reps - Staggered Stance Weight Shift with Arms Reaching  - 1 x daily - 5 x weekly - 2 sets - 10 reps - Alternating Step Backward with Support  - 1 x daily - 5 x weekly - 1-2 sets - 10 reps - Forward Monster Walk with Resistance at Ankles and Counter Support  - 1 x daily - 7 x weekly - 3 sets - 10 reps - Marching  forward  - 1 x daily - 7 x weekly - 3 sets - 10 reps - Forward Step Up  - 1 x daily - 3 x weekly - 1-2 sets - 10 reps       ------------------------------------------------------------------------------------------------ Objective measures below taken at initial evaluation: DIAGNOSTIC FINDINGS: none recent  COGNITION: Overall cognitive status: Within functional limits for tasks assessed   SENSATION: WFL to light touch  COORDINATION: Alternating pronation/supination: movements becoming smaller with each movement Alternating toe tap: movements becoming smaller with each movement Finger to nose: intact B  L hand tremor   MUSCLE TONE: WNL B    POSTURE: rounded shoulders, forward head, and increased thoracic kyphosis  LOWER EXTREMITY ROM:     Active  Right Eval Left Eval  Hip flexion    Hip extension    Hip abduction    Hip adduction    Hip internal rotation    Hip external rotation    Knee flexion    Knee extension    Ankle dorsiflexion 12 *compensates with knee 15  Ankle plantarflexion    Ankle inversion    Ankle eversion     (Blank rows = not tested)  LOWER EXTREMITY MMT:    MMT (in sitting except PF) Right Eval Left Eval  Hip flexion 4 4+  Hip extension    Hip abduction 4+ 4  Hip adduction 4+ 4  Hip internal rotation    Hip external rotation    Knee flexion 4 4  Knee extension 5 4  Ankle dorsiflexion  4 4+  Ankle plantarflexion 3+ 3+  Ankle inversion    Ankle eversion    (Blank rows = not tested)   GAIT: Gait pattern:  poor foot clearance and reduced B step length with L foot occasionally catching on floor  Assistive device utilized: None Level of assistance: Complete Independence   FUNCTIONAL TESTS:   [facility] PT Assessment - 03/06/22 0001       Mini-BESTest   Sit To Stand Normal: Comes to stand without use of hands and stabilizes independently.    Rise to Toes Normal: Stable for 3 s with maximum height.    Stand on one leg (left)  Moderate: < 20 s   5.47, 5.32   Stand on one leg (right) Moderate: < 20 s   9.81, 6.44   Stand on one leg - lowest score 1    Compensatory Stepping Correction - Forward Normal: Recovers independently with a single, large step (second realignement is allowed).    Compensatory Stepping Correction - Backward Normal: Recovers independently with a single, large step    Compensatory Stepping Correction - Left Lateral Normal: Recovers independently with 1 step (crossover or lateral OK)    Compensatory Stepping Correction - Right Lateral Normal: Recovers independently with 1 step (crossover or lateral OK)    Stepping Corredtion Lateral - lowest score 2    Stance - Feet together, eyes open, firm surface  Normal: 30s    Stance - Feet together, eyes closed, foam surface  Moderate: < 30s    Incline - Eyes Closed Normal: Stands independently 30s and aligns with gravity    Change in Gait Speed Normal: Significantly changes walkling speed without imbalance    Walk with head turns - Horizontal Normal: performs head turns with no change in gait speed and good balance    Walk with pivot turns Normal: Turns with feet close FAST (< 3 steps) with good balance.    Step over obstacles Normal: Able to step over box with minimal change of gait speed and with good balance.    Timed UP & GO with Dual Task Moderate: Dual Task affects either counting OR walking (>10%) when compared to the TUG without Dual Task.    Mini-BEST total score 25                 TODAY'S TREATMENT:                                                                                                                              DATE: 01/24/22    PATIENT EDUCATION: Education details: prognosis, POC, HEP; exam findings; benefits of OT- patient and wife want to pursue this  Person educated: Patient and Spouse Education method: Explanation, Demonstration, Tactile cues, Verbal cues, and Handouts Education comprehension: verbalized understanding  and returned demonstration  HOME EXERCISE PROGRAM: Access Code: [email or web address] URL: [email or web address] Date: 01/24/2022 Prepared by: [facility]  Exercises -  Single Leg Stance with Support  - 1 x daily - 5 x weekly - 2-3 sets - 30 sec hold - Standing Hip Abduction with Resistance at Ankles and Counter Support  - 1 x daily - 5 x weekly - 2 sets - 10 reps - Supine Bridge with Mini Swiss Ball Between Knees  - 1 x daily - 5 x weekly - 2 sets - 10 reps - Squat with Chair Touch  - 1 x daily - 5 x weekly - 2 sets - 10 reps   GOALS: Goals reviewed with patient? Yes  SHORT TERM GOALS: Target date: 02/14/2022  Patient to be independent with initial HEP. Baseline: HEP initiated Goal status: MET 02/16/22    LONG TERM GOALS: Target date: 03/07/2022  Patient to be independent with advanced HEP. Baseline: Not yet initiated  Goal status: GOAL MET, 03/06/2022  Patient to verbalize understanding of local Parkinson's Disease community resources including community fitness post D/C.   Baseline: Discussed community information in a previous session Goal status: GOAL MET  Patient to improve MiniBestTest score to at least 25 to decrease risk of falls.  Baseline: 23>25/28 03/06/2022 Goal status: GOAL MET  Patient to demonstrate <10% increase between TUG and TUG cognitive/manual to improve ability to dual task in home/community.  Baseline: 9.2% increase in manual, 34.6% increase in cognitive Goal status: GOAL NOT MET  Patient to verbalize understanding of fall prevention in home environment information. Baseline:  Goal status: GOAL MET, 03/06/2022    ASSESSMENT:  CLINICAL IMPRESSION: Assessed LTGs this visit, with pt meeting 4 of 5 LTGs.  Pt has met LTG 1, 2, 3, and 5.  He has improved score on MiniBESTest to 25/28, from 23/28, indicative of decreased fall risk.  He did not meet LTG 4, for improved TUG/TUG cognitive score, indicating continued  difficulty with dual task with gait.  He and wife have demonstrated and report consistent performance with therapy HEP and he has an exercise bike at home, which he is consistently using for aerobic exercise.  Patient and wife endorse improvements in strength and gait during the course of therapy and both seem pleased with pt's progress.  Pt is appropriate for d/c from PT this visit and are in agreement with recommendation for return PT eval in 6 months due to progressive nature of disease process.  OBJECTIVE IMPAIRMENTS: Abnormal gait, decreased balance, decreased coordination, decreased strength, impaired flexibility, improper body mechanics, and postural dysfunction.   ACTIVITY LIMITATIONS: carrying, lifting, bending, standing, squatting, stairs, bathing, toileting, dressing, hygiene/grooming, and caring for others  PARTICIPATION LIMITATIONS: meal prep, cleaning, laundry, shopping, community activity, yard work, and church  PERSONAL FACTORS: Age, Fitness, Past/current experiences, Time since onset of injury/illness/exacerbation, and 3+ comorbidities: L acoustic neuroma s/p resection 2022, L ear deafness, HTN, HLD, a-fib, cervical fusion 2022, L TSA 2018  are also affecting patient's functional outcome.   REHAB POTENTIAL: Good  CLINICAL DECISION MAKING: Evolving/moderate complexity  EVALUATION COMPLEXITY: Moderate  PLAN:  PT FREQUENCY:  2x/week for 3 weeks, then 1x/week for 3 weeks  PT DURATION: 6 weeks  PLANNED INTERVENTIONS: Therapeutic exercises, Therapeutic activity, Neuromuscular re-education, Balance training, Gait training, Patient/Family education, Self Care, Joint mobilization, Stair training, Vestibular training, Canalith repositioning, DME instructions, Aquatic Therapy, Dry Needling, Electrical stimulation, Cryotherapy, Moist heat, Taping, Manual therapy, and Re-evaluation  PLAN FOR NEXT SESSION:  Discharge this visit; plan for return PT eval in 6 months.   Mady Haagensen,  PT 03/06/22 1:43 PM Phone: [phone] Fax: [phone]   [facility] [place], [place], [place] [address] Phone # [phone] Fax # [phone]

## 2022-03-13 ENCOUNTER — Ambulatory Visit: Payer: Medicare PPO | Admitting: Occupational Therapy

## 2022-03-13 DIAGNOSIS — R278 Other lack of coordination: Secondary | ICD-10-CM | POA: Diagnosis not present

## 2022-03-13 DIAGNOSIS — M6281 Muscle weakness (generalized): Secondary | ICD-10-CM | POA: Diagnosis not present

## 2022-03-13 DIAGNOSIS — R29818 Other symptoms and signs involving the nervous system: Secondary | ICD-10-CM

## 2022-03-13 DIAGNOSIS — R2681 Unsteadiness on feet: Secondary | ICD-10-CM | POA: Diagnosis not present

## 2022-03-13 NOTE — Therapy (Signed)
OUTPATIENT OCCUPATIONAL THERAPY Treatment Session  Patient Name: DEMPSY Gray MRN: [id number] DOB:02-May-1955, 67 y.o., male Today's Date: 03/13/2022  PCP: Patrick Contras, MD REFERRING PROVIDER: Ludwig Clarks, DO  END OF SESSION:  OT End of Session - 03/13/22 1319     Visit Number 4    Number of Visits 9    Date for OT Re-Evaluation 04/07/22    Authorization Type [employer]    OT Start Time 1318    OT Stop Time 1400    OT Time Calculation (min) 42 min                Past Medical History:  Diagnosis Date   Acoustic neuroma ([place])    left   Anxiety    Arthritis    Complication of anesthesia    Delusion, violent, hallucination, hospitalized 8 days, urine retention   Deafness in left ear    Delusion ([place])    after fall on pain meds and prednisone   Dysrhythmia    PAF   Foley catheter in place    H/O cardiac radiofrequency ablation    HTN (hypertension)    Hypercholesterolemia    Hypopotassemia    Insomnia    Paroxysmal atrial fibrillation ([place])    Radiculopathy    after fall   Tremor    left hand   Vitamin B 12 deficiency    Past Surgical History:  Procedure Laterality Date   ACOUSTIC NEUROMA RESECTION Left 19990   ANTERIOR CERVICAL DECOMP/DISCECTOMY FUSION N/A 04/26/2020   Procedure: Anterior Cervical Decompression Fusion - Cervical five-Cervical six - Cervical six-Cervical seven;  Surgeon: Kary Kos, MD;  Location: [place];  Service: Neurosurgery;  Laterality: N/A;   APPENDECTOMY  1976   ATRIAL FIBRILLATION ABLATION N/A 12/19/2016   Procedure: ATRIAL FIBRILLATION ABLATION;  Surgeon: Thompson Grayer, MD;  Location: [place] [facility];  Service: Cardiovascular;  Laterality: N/A;   CARDIAC CATHETERIZATION N/A 10/07/2015   Procedure: Left Heart Cath and Coronary Angiography;  Surgeon: Josue Hector, MD;  Location: [place] [facility];  Service: Cardiovascular;  Laterality: N/A;   CARDIOVERSION N/A 06/17/2013   Procedure: CARDIOVERSION;  Surgeon: Josue Hector, MD;  Location: [place];  Service: Cardiovascular;  Laterality: N/A;   CARDIOVERSION N/A 05/11/2016   Procedure: CARDIOVERSION;  Surgeon: Sanda Klein, MD;  Location: [place] [facility];  Service: Cardiovascular;  Laterality: N/A;   CARDIOVERSION N/A 05/24/2017   Procedure: CARDIOVERSION;  Surgeon: Sueanne Margarita, MD;  Location: [facility] ENDOSCOPY;  Service: Cardiovascular;  Laterality: N/A;   CARDIOVERSION N/A 11/19/2017   Procedure: CARDIOVERSION;  Surgeon: Pixie Casino, MD;  Location: [facility];  Service: Cardiovascular;  Laterality: N/A;   [place] N/A 11/30/2015   Procedure: Atrial Fibrillation Ablation;  Surgeon: Thompson Grayer, MD;  Location: [place] [facility];  Service: Cardiovascular;  Laterality: N/A;   TEE WITHOUT CARDIOVERSION N/A 05/11/2016   Procedure: TRANSESOPHAGEAL ECHOCARDIOGRAM (TEE);  Surgeon: Sanda Klein, MD;  Location: [place];  Service: Cardiovascular;  Laterality: N/A;   [place] Left 06/15/2016   Procedure: TOTAL SHOULDER ARTHROPLASTY;  Surgeon: Tania Ade, MD;  Location: [place];  Service: Orthopedics;  Laterality: Left;  Left total shoulder arthroplasty   TRANSURETHRAL RESECTION OF PROSTATE N/A 07/02/2020   Procedure: TRANSURETHRAL RESECTION OF THE PROSTATE (TURP);  Surgeon: Lucas Mallow, MD;  Location: [facility];  Service: Urology;  Laterality:  N/A;   Patient Active Problem List   Diagnosis Date Noted   Parkinson's disease without dyskinesia or fluctuating manifestations 12/21/2021   BPH (benign prostatic hyperplasia) 07/02/2020   Myelopathy ([place]) 04/26/2020   Atrial flutter ([place]) 12/19/2016   S/P shoulder replacement, left 06/15/2016   A-fib ([place]) 11/30/2015   HTN (hypertension)    Hypercholesterolemia    Deafness in left ear    Atrial fibrillation ([place])    Hypopotassemia     ONSET DATE: referral date 01/25/22  REFERRING DIAG:  G20.A1 (ICD-10-CM) - Parkinson's disease without dyskinesia or fluctuating manifestations  THERAPY DIAG:  Other symptoms and signs involving the nervous system  Other lack of coordination  Muscle weakness (generalized)  Rationale for Evaluation and Treatment: Rehabilitation  SUBJECTIVE:   SUBJECTIVE STATEMENT: Pt reports certain tasks seem to have increased tremors. Pt accompanied by: self  PERTINENT HISTORY: L acoustic neuroma s/p resection 2022, L ear deafness, HTN, HLD, a-fib, cervical fusion 2022, L TSA 2018   PRECAUTIONS: Other: L hearing loss, h/o L shoulder surgery with limited ROM  WEIGHT BEARING RESTRICTIONS: No  PAIN:  Are you having pain? No  FALLS: Has patient fallen in last 6 months? No  PLOF: Independent and Leisure: playing music Careers adviser)  PATIENT GOALS: to stay where I am, to get better if possible  OBJECTIVE:   HAND DOMINANCE: Left  ADLs: Overall ADLs: Mod I Transfers/ambulation related to ADLs: Mod I Eating: "sloppy eater" LB Dressing: increased time with tying shoes  IADLs: Shopping: spouse is primary shopper, pt will pick up a few things if needed Light housekeeping: wife primary, but pt will help out as needed Meal Prep: wife primary, but pt will help out as needed Community mobility: driving Medication management: Mod I Financial management: spouse primary Handwriting: 90% legible and Increased time  MOBILITY STATUS: Independent  POSTURE COMMENTS:  rounded shoulders, forward head, and increased thoracic kyphosis  ACTIVITY TOLERANCE: Activity tolerance: WFL for tasks assessed on eval  FUNCTIONAL OUTCOME MEASURES: Physical performance test: #2 (self-feeding): 16.03 sec*  UPPER EXTREMITY ROM:    Active ROM Right eval Left eval  Shoulder flexion [facility] 72  Shoulder abduction    Shoulder adduction    Shoulder extension    Shoulder internal rotation WFL decreased  Shoulder external rotation WFL decreased  Elbow flexion WFL 117   Elbow extension WFL -20  Wrist flexion    Wrist extension    Wrist ulnar deviation    Wrist radial deviation    Wrist pronation    Wrist supination    (Blank rows = not tested)  Passive ROM Right eval Left eval  Shoulder flexion  128  Shoulder abduction    Shoulder adduction    Shoulder extension    Shoulder internal rotation    Shoulder external rotation    Elbow flexion    Elbow extension    Wrist flexion    Wrist extension    Wrist ulnar deviation    Wrist radial deviation    Wrist pronation    Wrist supination    (Blank rows = not tested)   COORDINATION: Finger Nose Finger test: mild dysmetria on L 9 Hole Peg test: Right: 22.44 sec; Left: 28.75 sec Box and Blocks:  Right 52 blocks, Left 48 blocks Tremors: Resting and Left RAM: mild decrease L > R Coordination: 3 button/unbutton: 42.03 sec (struggled with middle button but 1st and 3rd went much better)   TODAY'S TREATMENT:  03/13/22 Box and blocks: LUE: 48 blocks.   Coordination: placing small grip pegs into peg board with one at a time progressing to in-hand manipulation/translation. Pt with good motor control and success with both translation and one at a time.  Increased challenge to removing pegs with pt still able to do, however requiring min increased time. Ball activity: ball toss within L hand, tossing R <> L UE, and ball rotation at finger tips rotating clockwise and counter clockwise.  OT providing demonstration and verbal cues for large amplitude hand opening when tossing ball as well as large amplitude coordinated movements during rotation.  OT demonstrating WB through elbow or forearm to attempt to diminish tremors during ball rotation within finger tips.   Coins: picking up one at a time and placing into container, picking up one at a time and placing in palm of hand then translating to finger tips to place in coin slot.  Pt demonstrating min difficulty with  translation, but no dropping of coins.    03/06/22 Large amplitude: completed 1 set of 10 supination/pronation, hand opening/closing wide, and forward reaching with elbow extension and weight shifting at hips.  Pt benefiting from min verbal cues and demonstration to promote increased ROM as able. Cards: engaged in flipping cards in standing with focus on large open hand and supination and elbow extension when placing cards.  OT set up positioning of items to facilitate increased supination and elbow extension.   Functional reach: engaged in trunk rotation with picking up cones with L hand (on R side) and placing on elevated surface on L to facilitate trunk rotation, shoulder flexion, and elbow extension with reach.  OT providing demonstration and cues for full hand grasp on cones to provide increased grasp and decrease of tremor.  Pt verbalizing understanding of tremor reducing strategy.   Self-feeding: engaged in scooping beans with LUE to simulate self-feeding.  Pt demonstrating mod resting tremor but minimal tremor when scooping items.  OT educated on placement of hand on utensil for increased proximity to bowl of spoon and modifying grip pressure to decrease tremors during task.  Discussed alternative items as well with larger grip utensil, weighted utensils, etc. Coordination: educated on large thumb movement with sliding cards off deck with thumb and rotation of card between fingers.  OT educated on purposeful movements and large amplitude even during movements that are small in nature.  Pt with mod difficulty with sliding cards of deck, requiring small movements.   02/27/22 Large amplitude: OT educated on large amplitude exercises in sitting.  Engaged in forward reach with finger flicks and elbow extension, forearm supination/pronation, elbow flexion/extension, and full open hand, and finger flicks.  OT providing mod verbal cues and demonstration for technique.  Completed 2 sets of 10 of each  exercise. Supine shoulder ROM: engaged in open book stretch and PNF D2 in supine with focus on quality of movement.  OT providing verbal cues to focus on functional and opening hand during supine PNF D2 reaching to decrease tremor.  Pt demonstrating significant decrease with open hand vs closed hand and with visual attention to movement. Reaching in to cabinet with focus on large amplitude reaching, trunk movement with reaching to facilitate increased functional reach. OT providing verbal cues and demonstration on hand placement on cones to facilitate increased full grasp to decrease effect of tremor on movement.     PATIENT EDUCATION: Education details: grading of pressure and hand placement on items to decrease tremors, coordination exercises Person educated: Patient  and Spouse Education method: Explanation, Demonstration, Verbal cues, and Handouts Education comprehension: verbalized understanding and needs further education  HOME EXERCISE PROGRAM: Coordination HEP (see pt instructions)  Access Code: [email or web address] URL: [email or web address] Date: 02/27/2022 Prepared by: [facility]  Program Notes Focus on large amplitude movements!  Exercises - Seated Finger Flicks with Elbow Extension  - 1 x daily - 5 x weekly - 2 sets - 10 reps - Standing Forearm Pronation and Supination AROM  - 1 x daily - 5 x weekly - 2 sets - 10 reps - Seated Wrist Flexion and Extension  - 1 x daily - 5 x weekly - 2 sets - 10 reps - Hand AROM Composite Flexion  - 1 x daily - 5 x weekly - 2 sets - 10 reps - Thumb Opposition  - 1 x daily - 5 x weekly - 2 sets - 10 reps - Open Book Chest Stretch on Towel Roll  - 1 x daily - 3 x weekly - 2 sets - 10 reps - Supine PNF D2  - 1 x daily - 3 x weekly - 2 sets - 10 reps - 3-5 sec hold   GOALS: Goals reviewed with patient? Yes  SHORT TERM GOALS: Target date: 03/10/22  Pt will be Independent with PD specific  HEP. Baseline: Goal status: MET - 03/13/22  2.  Pt will demonstrate improved UE functional use for ADLs as evidenced by increasing box/ blocks score by 3 blocks with LUE Baseline: Right 52 blocks, Left 48 blocks Goal status: NOT MET  - 03/13/22  3.  Pt will verbalize understanding of adapted strategies to maximize safety and Independence with ADLs/ IADLs . Baseline:  Goal status: MET - 03/13/22  LONG TERM GOALS: Target date: 04/07/22  Pt will demonstrate improved ease with fastening buttons as evidenced by decreasing 3 button/ unbutton time to < 30 seconds. Baseline: 42.03 sec Goal status: IN PROGRESS  2.  Pt will demonstrate improved fine motor coordination for ADLs as evidenced by decreasing 9 hole peg test score for LUE by 5 secs Baseline: 9 Hole Peg test: Right: 22.44 sec; Left: 28.75 sec Goal status: IN PROGRESS  3.  Pt will demonstrate improved ease with feeding as evidenced by decreasing PPT#2 (self feeding) by 3 secs.  Baseline: 16.03 sec Goal status: IN PROGRESS  4.  Pt will demonstrate ability to retrieve a lightweight object at 90* shoulder flexion and -10* elbow extension with LUE Baseline:  shoulder 72* and elbow - 20* Goal status: IN PROGRESS  5.  Pt will verbalize understanding of ways to prevent future PD related complications and PD community resources. Baseline:  Goal status: IN PROGRESS   ASSESSMENT:  CLINICAL IMPRESSION: Pt demonstrating increased tremor during ball rotation in finger tips, despite cues to grading pressure and WB through forearm to attempt to decrease tremors.  Pt demonstrating good technique and motor control with [facility] tasks this session.  Pt able to verbalize strategies to maintain active during movements to attempt to decrease tremor.  PERFORMANCE DEFICITS: in functional skills including ADLs, IADLs, coordination, ROM, flexibility, Fine motor control, Gross motor control, mobility, balance, body mechanics, decreased knowledge of use of DME,  and UE functional use and psychosocial skills including coping strategies and routines and behaviors.   IMPAIRMENTS: are limiting patient from ADLs, IADLs, and leisure.   CO-MORBIDITIES: may have co-morbidities  that affects occupational performance. Patient will benefit from skilled OT to address above impairments and improve overall  function.  MODIFICATION OR ASSISTANCE TO COMPLETE EVALUATION: Min-Moderate modification of tasks or assist with assess necessary to complete an evaluation.  OT OCCUPATIONAL PROFILE AND HISTORY: Detailed assessment: Review of records and additional review of physical, cognitive, psychosocial history related to current functional performance.  CLINICAL DECISION MAKING: LOW - limited treatment options, no task modification necessary  REHAB POTENTIAL: Good  EVALUATION COMPLEXITY: Low    PLAN:  OT FREQUENCY: 1-2x/week  OT DURATION: 8 weeks (intend on 6 weeks, however due to the holidays pt will have a delayed start)  PLANNED INTERVENTIONS: self care/ADL training, therapeutic exercise, therapeutic activity, neuromuscular re-education, manual therapy, passive range of motion, balance training, functional mobility training, ultrasound, compression bandaging, moist heat, cryotherapy, patient/family education, psychosocial skills training, energy conservation, coping strategies training, and DME and/or AE instructions  RECOMMENDED OTHER SERVICES: NA  CONSULTED AND AGREED WITH PLAN OF CARE: Patient and family member/caregiver  PLAN FOR NEXT SESSION: Review large amplitude and shoulder ROM HEP, incorporate trunk movements with arm   Frankie Scipio, [place], OTR/L 03/13/2022, 3:48 PM

## 2022-03-21 ENCOUNTER — Ambulatory Visit: Payer: Medicare PPO | Admitting: Occupational Therapy

## 2022-03-21 DIAGNOSIS — R278 Other lack of coordination: Secondary | ICD-10-CM | POA: Diagnosis not present

## 2022-03-21 DIAGNOSIS — R29818 Other symptoms and signs involving the nervous system: Secondary | ICD-10-CM

## 2022-03-21 DIAGNOSIS — M6281 Muscle weakness (generalized): Secondary | ICD-10-CM | POA: Diagnosis not present

## 2022-03-21 DIAGNOSIS — R2681 Unsteadiness on feet: Secondary | ICD-10-CM

## 2022-03-21 NOTE — Therapy (Signed)
OUTPATIENT OCCUPATIONAL THERAPY Treatment Session  Patient Name: Patrick Gray MRN: [id number] DOB:[birth year], 67 y.o., male Today's Date: 03/21/2022  PCP: [name], MD REFERRING PROVIDER: Ludwig Clarks, DO  END OF SESSION:  OT End of Session - 03/21/22 1237     Visit Number 5    Number of Visits 9    Date for OT Re-Evaluation 04/07/22    Authorization Type [employer]    OT Start Time 5361    OT Stop Time 1315    OT Time Calculation (min) 40 min                 Past Medical History:  Diagnosis Date   Acoustic neuroma ([place])    left   Anxiety    Arthritis    Complication of anesthesia    Delusion, violent, hallucination, hospitalized 8 days, urine retention   Deafness in left ear    Delusion ([place])    after fall on pain meds and prednisone   Dysrhythmia    PAF   Foley catheter in place    H/O cardiac radiofrequency ablation    HTN (hypertension)    Hypercholesterolemia    Hypopotassemia    Insomnia    Paroxysmal atrial fibrillation ([place])    Radiculopathy    after fall   Tremor    left hand   Vitamin B 12 deficiency    Past Surgical History:  Procedure Laterality Date   ACOUSTIC NEUROMA RESECTION Left 19990   ANTERIOR CERVICAL DECOMP/DISCECTOMY FUSION N/A 04/26/2020   Procedure: Anterior Cervical Decompression Fusion - Cervical five-Cervical six - Cervical six-Cervical seven;  Surgeon: Kary Kos, MD;  Location: [facility];  Service: Neurosurgery;  Laterality: N/A;   APPENDECTOMY  1976   ATRIAL FIBRILLATION ABLATION N/A 12/19/2016   Procedure: ATRIAL FIBRILLATION ABLATION;  Surgeon: Thompson Grayer, MD;  Location: [facility];  Service: Cardiovascular;  Laterality: N/A;   CARDIAC CATHETERIZATION N/A 10/07/2015   Procedure: Left Heart Cath and Coronary Angiography;  Surgeon: Josue Hector, MD;  Location: [facility];  Service: Cardiovascular;  Laterality: N/A;   CARDIOVERSION N/A 06/17/2013   Procedure: CARDIOVERSION;  Surgeon: Josue Hector, MD;  Location: [facility];  Service: Cardiovascular;  Laterality: N/A;   CARDIOVERSION N/A 05/11/2016   Procedure: CARDIOVERSION;  Surgeon: Sanda Klein, MD;  Location: [facility];  Service: Cardiovascular;  Laterality: N/A;   CARDIOVERSION N/A 05/24/2017   Procedure: CARDIOVERSION;  Surgeon: Sueanne Margarita, MD;  Location: [facility];  Service: Cardiovascular;  Laterality: N/A;   CARDIOVERSION N/A 11/19/2017   Procedure: CARDIOVERSION;  Surgeon: Pixie Casino, MD;  Location: [facility];  Service: Cardiovascular;  Laterality: N/A;   [place] N/A 11/30/2015   Procedure: Atrial Fibrillation Ablation;  Surgeon: Thompson Grayer, MD;  Location: [facility];  Service: Cardiovascular;  Laterality: N/A;   TEE WITHOUT CARDIOVERSION N/A 05/11/2016   Procedure: TRANSESOPHAGEAL ECHOCARDIOGRAM (TEE);  Surgeon: Sanda Klein, MD;  Location: [facility];  Service: Cardiovascular;  Laterality: N/A;   [place] Left 06/15/2016   Procedure: TOTAL SHOULDER ARTHROPLASTY;  Surgeon: Tania Ade, MD;  Location: [facility];  Service: Orthopedics;  Laterality: Left;  Left total shoulder arthroplasty   TRANSURETHRAL RESECTION OF PROSTATE N/A 07/02/2020   Procedure: TRANSURETHRAL RESECTION OF THE PROSTATE (TURP);  Surgeon: Lucas Mallow, MD;  Location: [facility];  Service: Urology;  Laterality: N/A;   Patient Active Problem List   Diagnosis Date Noted   Parkinson's disease without dyskinesia or fluctuating manifestations 12/21/2021   BPH (benign prostatic hyperplasia) 07/02/2020   Myelopathy ([place]) 04/26/2020   Atrial flutter ([place]) 12/19/2016   S/P shoulder replacement, left 06/15/2016   A-fib ([place]) 11/30/2015   HTN (hypertension)    Hypercholesterolemia    Deafness in left ear    Atrial fibrillation ([place])    Hypopotassemia     ONSET DATE: referral date 01/25/22  REFERRING DIAG:  G20.A1 (ICD-10-CM) - Parkinson's disease without dyskinesia or fluctuating manifestations  THERAPY DIAG:  Other symptoms and signs involving the nervous system  Other lack of coordination  Muscle weakness (generalized)  Unsteadiness on feet  Rationale for Evaluation and Treatment: Rehabilitation  SUBJECTIVE:   SUBJECTIVE STATEMENT: Pt reports no changes. Pt accompanied by: self  PERTINENT HISTORY: L acoustic neuroma s/p resection 2022, L ear deafness, HTN, HLD, a-fib, cervical fusion 2022, L TSA 2018   PRECAUTIONS: Other: L hearing loss, h/o L shoulder surgery with limited ROM  WEIGHT BEARING RESTRICTIONS: No  PAIN:  Are you having pain? No  FALLS: Has patient fallen in last 6 months? No  PLOF: Independent and Leisure: playing music Careers adviser)  PATIENT GOALS: to stay where I am, to get better if possible  OBJECTIVE:   HAND DOMINANCE: Left  ADLs: Overall ADLs: Mod I Transfers/ambulation related to ADLs: Mod I Eating: "sloppy eater" LB Dressing: increased time with tying shoes  IADLs: Shopping: spouse is primary shopper, pt will pick up a few things if needed Light housekeeping: wife primary, but pt will help out as needed Meal Prep: wife primary, but pt will help out as needed Community mobility: driving Medication management: Mod I Financial management: spouse primary Handwriting: 90% legible and Increased time  MOBILITY STATUS: Independent  POSTURE COMMENTS:  rounded shoulders, forward head, and increased thoracic kyphosis  ACTIVITY TOLERANCE: Activity tolerance: WFL for tasks assessed on eval  FUNCTIONAL OUTCOME MEASURES: Physical performance test: #2 (self-feeding): 16.03 sec*  UPPER EXTREMITY ROM:    Active ROM Right eval Left eval  Shoulder flexion [facility] 72  Shoulder abduction    Shoulder adduction    Shoulder extension    Shoulder internal rotation WFL decreased  Shoulder external rotation WFL decreased  Elbow flexion WFL 117  Elbow  extension WFL -20  Wrist flexion    Wrist extension    Wrist ulnar deviation    Wrist radial deviation    Wrist pronation    Wrist supination    (Blank rows = not tested)  Passive ROM Right eval Left eval  Shoulder flexion  128  Shoulder abduction    Shoulder adduction    Shoulder extension    Shoulder internal rotation    Shoulder external rotation    Elbow flexion    Elbow extension    Wrist flexion    Wrist extension    Wrist ulnar deviation    Wrist radial deviation    Wrist pronation    Wrist supination    (Blank rows = not tested)   COORDINATION: Finger Nose Finger test: mild dysmetria on L 9 Hole Peg test: Right: 22.44 sec; Left: 28.75 sec Box and Blocks:  Right 52 blocks, Left 48 blocks Tremors: Resting and Left RAM: mild decrease L > R Coordination: 3 button/unbutton: 42.03 sec (struggled with middle button but 1st and 3rd went much better)   TODAY'S TREATMENT:  03/21/22 Resistance Clothespins: 2,4,6# with LUE for mid and high functional reaching and sustained pinch. Pt req'd min  cues for movement pattern and maintaining good positioning of LUE with reach. OT increasing challenge by incorporating trunk rotation to place clothespins across midline on lower surface to challenge trunk rotation, shoulder ROM, to carryover to functional routine tasks. Shoulder ROM: engaged in PNF D2 pattern reaching with focus on increased shoulder ROM and incorporating of trunk rotation with opening up and crossing midline.  OT providing demonstration and min cues for increased ROM as able.   Coordination: engaged in large amplitude hand opening and finger extension with open/close finger and then finger flicks in preparation for coordination tasks.  Pt then completing peg board activity placing pegs one at a time and then progress to completing with in-hand manipulation and translation from palm to finger tips.  Completed buttons with min  increased time initially but improved with repetition.    03/13/22 Box and blocks: LUE: 48 blocks.   Coordination: placing small grip pegs into peg board with one at a time progressing to in-hand manipulation/translation. Pt with good motor control and success with both translation and one at a time.  Increased challenge to removing pegs with pt still able to do, however requiring min increased time. Ball activity: ball toss within L hand, tossing R <> L UE, and ball rotation at finger tips rotating clockwise and counter clockwise.  OT providing demonstration and verbal cues for large amplitude hand opening when tossing ball as well as large amplitude coordinated movements during rotation.  OT demonstrating WB through elbow or forearm to attempt to diminish tremors during ball rotation within finger tips.   Coins: picking up one at a time and placing into container, picking up one at a time and placing in palm of hand then translating to finger tips to place in coin slot.  Pt demonstrating min difficulty with translation, but no dropping of coins.    03/06/22 Large amplitude: completed 1 set of 10 supination/pronation, hand opening/closing wide, and forward reaching with elbow extension and weight shifting at hips.  Pt benefiting from min verbal cues and demonstration to promote increased ROM as able. Cards: engaged in flipping cards in standing with focus on large open hand and supination and elbow extension when placing cards.  OT set up positioning of items to facilitate increased supination and elbow extension.   Functional reach: engaged in trunk rotation with picking up cones with L hand (on R side) and placing on elevated surface on L to facilitate trunk rotation, shoulder flexion, and elbow extension with reach.  OT providing demonstration and cues for full hand grasp on cones to provide increased grasp and decrease of tremor.  Pt verbalizing understanding of tremor reducing strategy.    Self-feeding: engaged in scooping beans with LUE to simulate self-feeding.  Pt demonstrating mod resting tremor but minimal tremor when scooping items.  OT educated on placement of hand on utensil for increased proximity to bowl of spoon and modifying grip pressure to decrease tremors during task.  Discussed alternative items as well with larger grip utensil, weighted utensils, etc. Coordination: educated on large thumb movement with sliding cards off deck with thumb and rotation of card between fingers.  OT educated on purposeful movements and large amplitude even during movements that are small in nature.  Pt with mod difficulty with sliding cards of deck, requiring small movements.  PATIENT EDUCATION: Education details: grading of pressure and hand placement on items to  decrease tremors, coordination exercises Person educated: Patient and Spouse Education method: Explanation, Demonstration, Verbal cues, and Handouts Education comprehension: verbalized understanding and needs further education  HOME EXERCISE PROGRAM: Coordination HEP (see pt instructions)  Access Code: [email or web address] URL: [email or web address] Date: 03/21/2022 Prepared by: [facility]  Program Notes Focus on large amplitude movements!  Exercises - Seated Finger Flicks with Elbow Extension  - 1 x daily - 5 x weekly - 2 sets - 10 reps - Standing Forearm Pronation and Supination AROM  - 1 x daily - 5 x weekly - 2 sets - 10 reps - Seated Wrist Flexion and Extension  - 1 x daily - 5 x weekly - 2 sets - 10 reps - Hand AROM Composite Flexion  - 1 x daily - 5 x weekly - 2 sets - 10 reps - Thumb Opposition  - 1 x daily - 5 x weekly - 2 sets - 10 reps - Open Book Chest Stretch on Towel Roll  - 1 x daily - 3 x weekly - 2 sets - 10 reps - Supine PNF D2  - 1 x daily - 3 x weekly - 2 sets - 10 reps - 3-5 sec hold - Shoulder PNF D2 Flexion  - 1 x daily - 3 x weekly - 2 sets - 10  reps   GOALS: Goals reviewed with patient? Yes  SHORT TERM GOALS: Target date: 03/10/22  Pt will be Independent with PD specific HEP. Baseline: Goal status: MET - 03/13/22  2.  Pt will demonstrate improved UE functional use for ADLs as evidenced by increasing box/ blocks score by 3 blocks with LUE Baseline: Right 52 blocks, Left 48 blocks Goal status: NOT MET  - 03/13/22  3.  Pt will verbalize understanding of adapted strategies to maximize safety and Independence with ADLs/ IADLs . Baseline:  Goal status: MET - 03/13/22  LONG TERM GOALS: Target date: 04/07/22  Pt will demonstrate improved ease with fastening buttons as evidenced by decreasing 3 button/ unbutton time to < 30 seconds. Baseline: 42.03 sec Goal status: IN PROGRESS  2.  Pt will demonstrate improved fine motor coordination for ADLs as evidenced by decreasing 9 hole peg test score for LUE by 5 secs Baseline: 9 Hole Peg test: Right: 22.44 sec; Left: 28.75 sec Goal status: IN PROGRESS  3.  Pt will demonstrate improved ease with feeding as evidenced by decreasing PPT#2 (self feeding) by 3 secs.  Baseline: 16.03 sec Goal status: IN PROGRESS  4.  Pt will demonstrate ability to retrieve a lightweight object at 90* shoulder flexion and -10* elbow extension with LUE Baseline:  shoulder 72* and elbow - 20* Goal status: IN PROGRESS  5.  Pt will verbalize understanding of ways to prevent future PD related complications and PD community resources. Baseline:  Goal status: IN PROGRESS   ASSESSMENT:  CLINICAL IMPRESSION: Pt demonstrating increased tremor at rest, however able to participate in fine and gross motor tasks with decreased tremor.  Pt demonstrating mild improvement in L shoulder ROM, attributing to exercises completed in PT and OT.  Pt benefiting from demonstration cues and cues for functional tasks to facilitate increased carryover with PNF pattern reaching.  PERFORMANCE DEFICITS: in functional skills including  ADLs, IADLs, coordination, ROM, flexibility, Fine motor control, Gross motor control, mobility, balance, body mechanics, decreased knowledge of use of DME, and UE functional use and psychosocial skills including coping strategies and routines and behaviors.   IMPAIRMENTS: are limiting patient  from ADLs, IADLs, and leisure.   CO-MORBIDITIES: may have co-morbidities  that affects occupational performance. Patient will benefit from skilled OT to address above impairments and improve overall function.  MODIFICATION OR ASSISTANCE TO COMPLETE EVALUATION: Min-Moderate modification of tasks or assist with assess necessary to complete an evaluation.  OT OCCUPATIONAL PROFILE AND HISTORY: Detailed assessment: Review of records and additional review of physical, cognitive, psychosocial history related to current functional performance.  CLINICAL DECISION MAKING: LOW - limited treatment options, no task modification necessary  REHAB POTENTIAL: Good  EVALUATION COMPLEXITY: Low    PLAN:  OT FREQUENCY: 1-2x/week  OT DURATION: 8 weeks (intend on 6 weeks, however due to the holidays pt will have a delayed start)  PLANNED INTERVENTIONS: self care/ADL training, therapeutic exercise, therapeutic activity, neuromuscular re-education, manual therapy, passive range of motion, balance training, functional mobility training, ultrasound, compression bandaging, moist heat, cryotherapy, patient/family education, psychosocial skills training, energy conservation, coping strategies training, and DME and/or AE instructions  RECOMMENDED OTHER SERVICES: NA  CONSULTED AND AGREED WITH PLAN OF CARE: Patient and family member/caregiver  PLAN FOR NEXT SESSION: Review large amplitude and shoulder ROM HEP, incorporate trunk movements with arm, [place] with self-feeding and pegs   Jayten Gabbard, [place], OTR/L 03/21/2022, 12:38 PM

## 2022-03-23 DIAGNOSIS — C61 Malignant neoplasm of prostate: Secondary | ICD-10-CM | POA: Diagnosis not present

## 2022-03-28 ENCOUNTER — Ambulatory Visit: Payer: Medicare PPO | Attending: Neurology | Admitting: Occupational Therapy

## 2022-03-28 DIAGNOSIS — R29818 Other symptoms and signs involving the nervous system: Secondary | ICD-10-CM | POA: Diagnosis not present

## 2022-03-28 DIAGNOSIS — R278 Other lack of coordination: Secondary | ICD-10-CM | POA: Insufficient documentation

## 2022-03-28 DIAGNOSIS — R2681 Unsteadiness on feet: Secondary | ICD-10-CM | POA: Insufficient documentation

## 2022-03-28 DIAGNOSIS — M6281 Muscle weakness (generalized): Secondary | ICD-10-CM | POA: Diagnosis not present

## 2022-03-28 NOTE — Therapy (Signed)
OUTPATIENT OCCUPATIONAL THERAPY Treatment Session  Patient Name: Patrick Gray MRN: [id number] DOB:01/29/1956, 67 y.o., male Today's Date: 03/28/2022  PCP: [name], MD REFERRING PROVIDER: Ludwig Clarks, DO  END OF SESSION:  OT End of Session - 03/28/22 1236     Visit Number 6    Number of Visits 9    Date for OT Re-Evaluation 04/07/22    Authorization Type [employer]    OT Start Time 5974    OT Stop Time 1315    OT Time Calculation (min) 40 min                 Past Medical History:  Diagnosis Date   Acoustic neuroma ([place])    left   Anxiety    Arthritis    Complication of anesthesia    Delusion, violent, hallucination, hospitalized 8 days, urine retention   Deafness in left ear    Delusion ([place])    after fall on pain meds and prednisone   Dysrhythmia    PAF   Foley catheter in place    H/O cardiac radiofrequency ablation    HTN (hypertension)    Hypercholesterolemia    Hypopotassemia    Insomnia    Paroxysmal atrial fibrillation ([place])    Radiculopathy    after fall   Tremor    left hand   Vitamin B 12 deficiency    Past Surgical History:  Procedure Laterality Date   ACOUSTIC NEUROMA RESECTION Left 19990   ANTERIOR CERVICAL DECOMP/DISCECTOMY FUSION N/A 04/26/2020   Procedure: Anterior Cervical Decompression Fusion - Cervical five-Cervical six - Cervical six-Cervical seven;  Surgeon: Kary Kos, MD;  Location: [place];  Service: Neurosurgery;  Laterality: N/A;   APPENDECTOMY  1976   ATRIAL FIBRILLATION ABLATION N/A 12/19/2016   Procedure: ATRIAL FIBRILLATION ABLATION;  Surgeon: Thompson Grayer, MD;  Location: [place] [facility];  Service: Cardiovascular;  Laterality: N/A;   CARDIAC CATHETERIZATION N/A 10/07/2015   Procedure: Left Heart Cath and Coronary Angiography;  Surgeon: Josue Hector, MD;  Location: [place] [facility];  Service: Cardiovascular;  Laterality: N/A;   CARDIOVERSION N/A 06/17/2013   Procedure: CARDIOVERSION;  Surgeon: Josue Hector, MD;  Location: [place];  Service: Cardiovascular;  Laterality: N/A;   CARDIOVERSION N/A 05/11/2016   Procedure: CARDIOVERSION;  Surgeon: Sanda Klein, MD;  Location: [place] [facility];  Service: Cardiovascular;  Laterality: N/A;   CARDIOVERSION N/A 05/24/2017   Procedure: CARDIOVERSION;  Surgeon: Sueanne Margarita, MD;  Location: [facility];  Service: Cardiovascular;  Laterality: N/A;   CARDIOVERSION N/A 11/19/2017   Procedure: CARDIOVERSION;  Surgeon: Pixie Casino, MD;  Location: [facility];  Service: Cardiovascular;  Laterality: N/A;   [place] N/A 11/30/2015   Procedure: Atrial Fibrillation Ablation;  Surgeon: Thompson Grayer, MD;  Location: [place] [facility];  Service: Cardiovascular;  Laterality: N/A;   TEE WITHOUT CARDIOVERSION N/A 05/11/2016   Procedure: TRANSESOPHAGEAL ECHOCARDIOGRAM (TEE);  Surgeon: Sanda Klein, MD;  Location: [place];  Service: Cardiovascular;  Laterality: N/A;   [place] Left 06/15/2016   Procedure: TOTAL SHOULDER ARTHROPLASTY;  Surgeon: Tania Ade, MD;  Location: [place];  Service: Orthopedics;  Laterality: Left;  Left total shoulder arthroplasty   TRANSURETHRAL RESECTION OF PROSTATE N/A 07/02/2020   Procedure: TRANSURETHRAL RESECTION OF THE PROSTATE (TURP);  Surgeon: Lucas Mallow, MD;  Location: [facility];  Service: Urology;  Laterality: N/A;   Patient Active Problem List   Diagnosis Date Noted   Parkinson's disease without dyskinesia or fluctuating manifestations 12/21/2021   BPH (benign prostatic hyperplasia) 07/02/2020   Myelopathy ([place]) 04/26/2020   Atrial flutter ([place]) 12/19/2016   S/P shoulder replacement, left 06/15/2016   A-fib ([place]) 11/30/2015   HTN (hypertension)    Hypercholesterolemia    Deafness in left ear    Atrial fibrillation ([place])    Hypopotassemia     ONSET DATE: referral date 01/25/22  REFERRING DIAG:  G20.A1 (ICD-10-CM) - Parkinson's disease without dyskinesia or fluctuating manifestations  THERAPY DIAG:  Other symptoms and signs involving the nervous system  Other lack of coordination  Muscle weakness (generalized)  Unsteadiness on feet  Rationale for Evaluation and Treatment: Rehabilitation  SUBJECTIVE:   SUBJECTIVE STATEMENT: Pt reports he has been trying to play guitar, but it is not the same as before.   Pt accompanied by: self  PERTINENT HISTORY: L acoustic neuroma s/p resection 2022, L ear deafness, HTN, HLD, a-fib, cervical fusion 2022, L TSA 2018   PRECAUTIONS: Other: L hearing loss, h/o L shoulder surgery with limited ROM  WEIGHT BEARING RESTRICTIONS: No  PAIN:  Are you having pain? No  FALLS: Has patient fallen in last 6 months? No  PLOF: Independent and Leisure: playing music Careers adviser)  PATIENT GOALS: to stay where I am, to get better if possible  OBJECTIVE:   HAND DOMINANCE: Left  ADLs: Overall ADLs: Mod I Transfers/ambulation related to ADLs: Mod I Eating: "sloppy eater" LB Dressing: increased time with tying shoes  IADLs: Shopping: spouse is primary shopper, pt will pick up a few things if needed Light housekeeping: wife primary, but pt will help out as needed Meal Prep: wife primary, but pt will help out as needed Community mobility: driving Medication management: Mod I Financial management: spouse primary Handwriting: 90% legible and Increased time  MOBILITY STATUS: Independent  POSTURE COMMENTS:  rounded shoulders, forward head, and increased thoracic kyphosis  ACTIVITY TOLERANCE: Activity tolerance: WFL for tasks assessed on eval  FUNCTIONAL OUTCOME MEASURES: Physical performance test: #2 (self-feeding): 16.03 sec*  UPPER EXTREMITY ROM:    Active ROM Right eval Left eval  Shoulder flexion [facility] 72  Shoulder abduction    Shoulder adduction    Shoulder extension    Shoulder internal rotation WFL decreased  Shoulder external  rotation WFL decreased  Elbow flexion WFL 117  Elbow extension WFL -20  Wrist flexion    Wrist extension    Wrist ulnar deviation    Wrist radial deviation    Wrist pronation    Wrist supination    (Blank rows = not tested)  Passive ROM Right eval Left eval  Shoulder flexion  128  Shoulder abduction    Shoulder adduction    Shoulder extension    Shoulder internal rotation    Shoulder external rotation    Elbow flexion    Elbow extension    Wrist flexion    Wrist extension    Wrist ulnar deviation    Wrist radial deviation    Wrist pronation    Wrist supination    (Blank rows = not tested)   COORDINATION: Finger Nose Finger test: mild dysmetria on L 9 Hole Peg test: Right: 22.44 sec; Left: 28.75 sec Box and Blocks:  Right 52 blocks, Left 48 blocks Tremors: Resting and Left RAM: mild decrease L > R Coordination: 3 button/unbutton: 42.03 sec (struggled with middle button but 1st and 3rd went much  better)   TODAY'S TREATMENT:                                               03/28/22 Large amplitude: PNF D2 pattern in standing with focus on large amplitude reaching.  OT providing demonstration and cues for weight shifting and trunk rotation to increase ROM.  Flipping cards ***  Discussed functional carryover of reaching into daily tasks and leisure tasks. Self-feeding: L: 13.0 sec Buttons: 28.75 sec 9 hole peg test: L: 25.10 sec and R: 26.9  Provided with community resources and discussed f/u with recommendation for eval vs screening.  Pt's spouse supportive and intuitive.    03/21/22 Resistance Clothespins: 2,4,6# with LUE for mid and high functional reaching and sustained pinch. Pt req'd min  cues for movement pattern and maintaining good positioning of LUE with reach. OT increasing challenge by incorporating trunk rotation to place clothespins across midline on lower surface to challenge trunk rotation, shoulder ROM, to carryover to functional routine tasks. Shoulder ROM:  engaged in PNF D2 pattern reaching with focus on increased shoulder ROM and incorporating of trunk rotation with opening up and crossing midline.  OT providing demonstration and min cues for increased ROM as able.   Coordination: engaged in large amplitude hand opening and finger extension with open/close finger and then finger flicks in preparation for coordination tasks.  Pt then completing peg board activity placing pegs one at a time and then progress to completing with in-hand manipulation and translation from palm to finger tips.  Completed buttons with min increased time initially but improved with repetition.    03/13/22 Box and blocks: LUE: 48 blocks.   Coordination: placing small grip pegs into peg board with one at a time progressing to in-hand manipulation/translation. Pt with good motor control and success with both translation and one at a time.  Increased challenge to removing pegs with pt still able to do, however requiring min increased time. Ball activity: ball toss within L hand, tossing R <> L UE, and ball rotation at finger tips rotating clockwise and counter clockwise.  OT providing demonstration and verbal cues for large amplitude hand opening when tossing ball as well as large amplitude coordinated movements during rotation.  OT demonstrating WB through elbow or forearm to attempt to diminish tremors during ball rotation within finger tips.   Coins: picking up one at a time and placing into container, picking up one at a time and placing in palm of hand then translating to finger tips to place in coin slot.  Pt demonstrating min difficulty with translation, but no dropping of coins.   PATIENT EDUCATION: Education details: grading of pressure and hand placement on items to decrease tremors, coordination exercises Person educated: Patient and Spouse Education method: Explanation, Demonstration, Verbal cues, and Handouts Education comprehension: verbalized understanding and needs  further education  HOME EXERCISE PROGRAM: Coordination HEP (see pt instructions)  Access Code: [email or web address] URL: [email or web address] Date: 03/21/2022 Prepared by: [facility]  Program Notes Focus on large amplitude movements!  Exercises - Seated Finger Flicks with Elbow Extension  - 1 x daily - 5 x weekly - 2 sets - 10 reps - Standing Forearm Pronation and Supination AROM  - 1 x daily - 5 x weekly - 2 sets - 10 reps - Seated Wrist Flexion and Extension  -  1 x daily - 5 x weekly - 2 sets - 10 reps - Hand AROM Composite Flexion  - 1 x daily - 5 x weekly - 2 sets - 10 reps - Thumb Opposition  - 1 x daily - 5 x weekly - 2 sets - 10 reps - Open Book Chest Stretch on Towel Roll  - 1 x daily - 3 x weekly - 2 sets - 10 reps - Supine PNF D2  - 1 x daily - 3 x weekly - 2 sets - 10 reps - 3-5 sec hold - Shoulder PNF D2 Flexion  - 1 x daily - 3 x weekly - 2 sets - 10 reps   GOALS: Goals reviewed with patient? Yes  SHORT TERM GOALS: Target date: 03/10/22  Pt will be Independent with PD specific HEP. Baseline: Goal status: MET - 03/13/22  2.  Pt will demonstrate improved UE functional use for ADLs as evidenced by increasing box/ blocks score by 3 blocks with LUE Baseline: Right 52 blocks, Left 48 blocks Goal status: NOT MET  - 03/13/22  3.  Pt will verbalize understanding of adapted strategies to maximize safety and Independence with ADLs/ IADLs . Baseline:  Goal status: MET - 03/13/22  LONG TERM GOALS: Target date: 04/07/22  Pt will demonstrate improved ease with fastening buttons as evidenced by decreasing 3 button/ unbutton time to < 30 seconds. Baseline: 42.03 sec Goal status: MET - 28.75 sec on 03/28/22  2.  Pt will demonstrate improved fine motor coordination for ADLs as evidenced by decreasing 9 hole peg test score for LUE by 5 secs Baseline: 9 Hole Peg test: Right: 22.44 sec; Left: 28.75 sec Goal status: IN PROGRESS  3.  Pt will  demonstrate improved ease with feeding as evidenced by decreasing PPT#2 (self feeding) by 3 secs.  Baseline: 16.03 sec Goal status: MET - 13.0 sec  4.  Pt will demonstrate ability to retrieve a lightweight object at 90* shoulder flexion and -10* elbow extension with LUE Baseline:  shoulder 72* and elbow - 20* Goal status: IN PROGRESS  5.  Pt will verbalize understanding of ways to prevent future PD related complications and PD community resources. Baseline:  Goal status: IN PROGRESS   ASSESSMENT:  CLINICAL IMPRESSION: Pt demonstrating increased tremor at rest, however able to participate in fine and gross motor tasks with decreased tremor.  Pt demonstrating mild improvement in L shoulder ROM, attributing to exercises completed in PT and OT.  Pt benefiting from demonstration cues and cues for functional tasks to facilitate increased carryover with PNF pattern reaching.  PERFORMANCE DEFICITS: in functional skills including ADLs, IADLs, coordination, ROM, flexibility, Fine motor control, Gross motor control, mobility, balance, body mechanics, decreased knowledge of use of DME, and UE functional use and psychosocial skills including coping strategies and routines and behaviors.   IMPAIRMENTS: are limiting patient from ADLs, IADLs, and leisure.   CO-MORBIDITIES: may have co-morbidities  that affects occupational performance. Patient will benefit from skilled OT to address above impairments and improve overall function.  MODIFICATION OR ASSISTANCE TO COMPLETE EVALUATION: Min-Moderate modification of tasks or assist with assess necessary to complete an evaluation.  OT OCCUPATIONAL PROFILE AND HISTORY: Detailed assessment: Review of records and additional review of physical, cognitive, psychosocial history related to current functional performance.  CLINICAL DECISION MAKING: LOW - limited treatment options, no task modification necessary  REHAB POTENTIAL: Good  EVALUATION COMPLEXITY:  Low    PLAN:  OT FREQUENCY: 1-2x/week  OT DURATION: 8 weeks (intend  on 6 weeks, however due to the holidays pt will have a delayed start)  PLANNED INTERVENTIONS: self care/ADL training, therapeutic exercise, therapeutic activity, neuromuscular re-education, manual therapy, passive range of motion, balance training, functional mobility training, ultrasound, compression bandaging, moist heat, cryotherapy, patient/family education, psychosocial skills training, energy conservation, coping strategies training, and DME and/or AE instructions  RECOMMENDED OTHER SERVICES: NA  CONSULTED AND AGREED WITH PLAN OF CARE: Patient and family member/caregiver  PLAN FOR NEXT SESSION: PD screen in 6 months.   Simonne Come, OTR/L 03/28/2022, 12:36 PM

## 2022-03-30 ENCOUNTER — Encounter (HOSPITAL_COMMUNITY): Payer: Self-pay | Admitting: *Deleted

## 2022-03-30 DIAGNOSIS — N400 Enlarged prostate without lower urinary tract symptoms: Secondary | ICD-10-CM | POA: Diagnosis not present

## 2022-03-30 DIAGNOSIS — C61 Malignant neoplasm of prostate: Secondary | ICD-10-CM | POA: Diagnosis not present

## 2022-05-07 ENCOUNTER — Other Ambulatory Visit: Payer: Self-pay | Admitting: Nurse Practitioner

## 2022-05-26 NOTE — Progress Notes (Unsigned)
Assessment/Plan:   1.   Parkinsons Disease, diagnosed November, 2023             -Continue carbidopa/levodopa 25/100, 1 tablet 3 times per day.  -We discussed that it used to be thought that levodopa would increase risk of melanoma but now it is believed that Parkinsons itself likely increases risk of melanoma. he is to get regular skin checks.  -proud of him for the exercise!    2.  B12 deficiency             -On oral supplement   3.  History of cervical myelopathy from severe cervical stenosis at C5-C6             -Status post surgical intervention by Dr. Wynetta Emery in March, 2022.   4.  History of vestibular schwannoma             -Status post surgical intervention/removal many years ago (30+), but MRI in March, 2022 demonstrated 23 x 16 x 14 mm mass, consistent with vestibular schwannoma   5.  A-fib             -on eliquis   6.  Alcohol use with transaminasemia             -he is now limiting alcohol to no more than 2 days/week.    Subjective:   Patrick Gray was seen today in follow up for Parkinsons disease, diagnosed last visit.  Pt with wife who supplements hx.  My previous records were reviewed prior to todays visit as well as outside records available to me.  Patient was started on levodopa last visit.  He is tolerating medication well, without side effect.  Pt denies falls.  He has attended physical and occupational therapies since our last visit.  He is exercising and riding his stationary bike.   Notes are reviewed.  Pt denies lightheadedness, near syncope.  No hallucinations.  Mood has been good.  Current prescribed movement disorder medications: Carbidopa/levodopa 25/100, 1 tablet 3 times per day B12 supplement   ALLERGIES:   Allergies  Allergen Reactions   Hydrocodone Other (See Comments)   Other Other (See Comments)    opioids hallucination   Oxycodone Other (See Comments)    hallucinations   Prednisolone Other (See Comments)   Penicillins Rash and Other (See  Comments)    Has patient had a PCN reaction causing immediate rash, facial/tongue/throat swelling, SOB or lightheadedness with hypotension: No Has patient had a PCN reaction causing severe rash involving mucus membranes or skin necrosis: No Has patient had a PCN reaction that required hospitalization No Has patient had a PCN reaction occurring within the last 10 years: No If all of the above answers are "NO", then may proceed with Cephalosporin use.     CURRENT MEDICATIONS:  Current Meds  Medication Sig   AMBULATORY NON FORMULARY MEDICATION Medication Name: Neuralli   amLODipine (NORVASC) 10 MG tablet Take 10 mg by mouth daily.   apixaban (ELIQUIS) 5 MG TABS tablet Take 1 tablet by mouth twice daily   atorvastatin (LIPITOR) 20 MG tablet Take 20 mg by mouth daily.   carbidopa-levodopa (SINEMET IR) 25-100 MG tablet Take 1 tablet by mouth 3 (three) times daily. 6am/11am/4pm   Cholecalciferol (VITAMIN D-3) 5000 units TABS Take 5,000 Units by mouth daily.   Coenzyme Q10 (COQ10) 100 MG CAPS Take 100 mg by mouth daily.   metoprolol tartrate (LOPRESSOR) 50 MG tablet TAKE 1 TABLET BY MOUTH TWICE DAILY.  MAY TAKE AN EXTRA 1/2 TABLET AS NEEDED FOR BREAKTHROUGH AFIB   Multiple Vitamin (MULTIVITAMIN) tablet Take 2 tablets by mouth daily.   olmesartan-hydrochlorothiazide (BENICAR HCT) 40-25 MG tablet Take 1 tablet by mouth daily.   potassium chloride SA (KLOR-CON M) 20 MEQ tablet Take 2 tablet (40 meq) by mouth in the morning & take 1 tablet (20 meq) by mouth in the evening.   sildenafil (REVATIO) 20 MG tablet Take 20-100 mg by mouth daily as needed for erectile dysfunction.   traZODone (DESYREL) 50 MG tablet Take 50 mg by mouth at bedtime.   zinc gluconate 50 MG tablet Take 50 mg by mouth daily.     Objective:   PHYSICAL EXAMINATION:    VITALS:   Vitals:   05/30/22 0812  BP: (!) 132/90  Pulse: 73  SpO2: 97%  Weight: 223 lb (101.2 kg)  Height: 5\' 11"  (1.803 m)    GEN:  The patient appears  stated age and is in NAD. HEENT:  Normocephalic, atraumatic.  The mucous membranes are moist. The superficial temporal arteries are without ropiness or tenderness. CV:  RRR Lungs:  CTAB Neck/HEME:  There are no carotid bruits bilaterally.   Neurological examination:   Orientation: The patient is alert and oriented x3.  Cranial nerves: There is decreased palpebral fissure on the L c/t R but otherwise good facial symmetry.  Extraocular muscles are intact. The visual fields are full to confrontational testing. The speech is fluent and clear. Soft palate rises symmetrically and there is no tongue deviation. Hearing is intact to conversational tone but decreased on the left (ever since removal of acoustic neuroma). Sensation: Sensation is intact to light touch throughout  Motor: Strength is at least antigravity x 4    Movement examination: Tone: There is nl tone in the UE/LE Abnormal movements: there is LUE rest tremor, mod.  There is no postural tremor or intention tremor.  There is re-emergent tremor on the L Coordination:  There is no decremation with any form of RAMS, including alternating supination and pronation of the forearm, hand opening and closing, finger taps, heel taps and toe taps.  Gait and Station: The patient has no difficulty arising out of a deep-seated chair without the use of the hands.  He is walking well with good arm swing and just a bit antalgic on the L  I have reviewed and interpreted the following labs independently    Chemistry      Component Value Date/Time   NA 140 07/02/2020 1232   NA 142 03/18/2019 1210   K 3.5 07/02/2020 1232   CL 106 07/02/2020 1232   CO2 27 07/02/2020 1232   BUN 18 07/02/2020 1232   BUN 16 03/18/2019 1210   CREATININE 0.65 07/02/2020 1232   CREATININE 0.87 11/15/2015 1009      Component Value Date/Time   CALCIUM 9.8 07/02/2020 1232   ALKPHOS 58 04/26/2020 0852   AST 71 (H) 04/26/2020 0852   ALT 84 (H) 04/26/2020 0852   BILITOT  1.5 (H) 04/26/2020 0852       Lab Results  Component Value Date   WBC 4.5 07/02/2020   HGB 14.7 07/02/2020   HCT 41.4 07/02/2020   MCV 86.6 07/02/2020   PLT 150 07/02/2020    Lab Results  Component Value Date   TSH 2.727 05/02/2020      Cc:  Tally JoeSwayne, David, MD

## 2022-05-30 ENCOUNTER — Encounter: Payer: Self-pay | Admitting: Neurology

## 2022-05-30 ENCOUNTER — Ambulatory Visit: Payer: Medicare PPO | Admitting: Neurology

## 2022-05-30 VITALS — BP 132/90 | HR 73 | Ht 71.0 in | Wt 223.0 lb

## 2022-05-30 DIAGNOSIS — G20A1 Parkinson's disease without dyskinesia, without mention of fluctuations: Secondary | ICD-10-CM

## 2022-05-30 MED ORDER — CARBIDOPA-LEVODOPA 25-100 MG PO TABS: 1.0000 | ORAL_TABLET | Freq: Three times a day (TID) | ORAL | 1 refills | Status: DC

## 2022-05-30 NOTE — Patient Instructions (Addendum)
As we discussed, it used to be thought that levodopa would increase risk of melanoma but now it is believed that Parkinsons itself likely increases risk of melanoma. I recommend yearly skin checks with a board certified dermatologist.  You can call [facility] or [facility] for an appointment.  [facility] Address: [address] [place], [place], [place] [address] Phone: [phone]  [facility] [address] [place], [place], [place] Phone: [phone]   If you would like to be on our email list you can contact [email or web address]  Local and Online Resources for Power over Parkinson's Group  April 2024   LOCAL [place] PARKINSON'S GROUPS   Power over Parkinson's Group:    Power Over Parkinson's Patient Education Group will be Wednesday, April 10th-*Hybrid meting*- in person at [facility] location and via [facility], 2:00-3:00 pm.   Power over [employer] and Care Partner Groups will meet together, with plans for separate break out session for caregivers, depending on topic/speaker Upcoming Power over Parkinson's Meetings/Care Partner Support:  2nd Wednesdays of the month at 2 pm:   April 10th, May 8th Contact Amy Marriott at [email or web address] if interested in participating in this group    LOCAL EVENTS AND NEW OFFERINGS  NEW:  Parkinson's Social Game Night.  First Thursday of each month, 2:00-4:00 pm.  *Next date is April 4th*.  Rossie Muskrat [employer], [employer].  Contact [email or web address] if interested. Parkinson's CarePartner Group for Men is in the works, if interested email [email or web address] ACT FITNESS Chair Yoga classes "Train and Gain", Fridays 10 am, [employer].  Contact Gina at [phone].  Health visitor Classes offering at [employer]!  TUESDAYS (Chair Yoga)  and Wednesdays (PWR! Moves)  1:00  pm.   Contact Synetta Shadow at  [email or web address]  or [phone]  Drumming for Parkinson's will be held on 2nd and 4th Mondays at 11:00 am.   Located at the [place] of the [employer] ([address] [place].)  Contact Albertina Parr at [email or web address] or [phone]  Dance for Parkinson 's classes will be on Tuesdays 10-11 am. Located in the [employer], in the first floor of the [employer] ([address] To register:  [email or web address] or [phone] [facility] Parkinson's Tai Chi Class, Mondays at 11 am.  Call [phone] for details Hamil-Kerr Challenge.  Bike, Run, [employer] for [employer].  Saturday, April 6th at [facility].  To register, visit [email or web address] Moving Day [facility].  Saturday, May 4th, 10 am start.  Register at [email or web address]    ONLINE EDUCATION AND SUPPORT  Parkinson Foundation:  www.parkinson.org  PD Health at Home continues:  Mindfulness Mondays, Wellness Wednesdays, Fitness Fridays  (PWR! Moves as part of Fitness Fridays March 22nd, 1-1:45 pm) Upcoming Education:   Parkinson's 101.  Wednesday, April 3rd, 1-2 pm Movement for Parkinson's.  Wednesday, May 1st, 1-2 pm Expert Briefing:  Research Update:  Working to [employer] PD.  Wednesday, April 10th, 1-2 pm Trouble with Zzz's:  Sleep Challenges with Parkinson's.  Wed, May 8th 1-2 pm Register for virtual education and expert briefings (webinars) at [email or web address].gl Please check out their website to sign up for emails and see their full online offerings     Gardner [employer]le Foundation:  www.[email or web address]   Third Thursday Webinars:  On the third Thursday of every month at 12 p.m. ET, join our free live webinars to learn about various aspects of living  with Parkinson's disease and our work to speed medical breakthroughs.  Upcoming Webinar:  Let's Talk Taboos:   Hard-to-Discuss Parkinson's Symptoms.  Thursday, April 18th at 12 noon. Check out additional information on their website to see their full online offerings    [facility] Phinney Foundation:  www.[email or web address]  Upcoming Webinar:   Emergent Therapies.  Wednesday, April 2nd, 4 pm Series:  Living with Parkinson's Meetup.   Third Thursdays each month, 3 pm  Care Partner Monthly Meetup.  With Jillene Bucksonnie Carpenter Phinney.  First Tuesday of each month, 2 pm  Check out additional information to Live Well Today on their website    Parkinson and Movement Disorders (PMD) Alliance:  www.pmdalliance.org  [employer] Online:  Online Education Events  Sign up for emails, which are sent weekly to give you updates on programming and online offerings    Parkinson's Association of the [place]:  www.parkinsonassociation.org  Information on online support groups, education events, and online exercises including Yoga, Parkinson's exercises and more-LOTS of information on links to PD resources and online events  Virtual Support Group through Parkinson's Association of the [place]; next one is scheduled for Wednesday, April 3rd  MOVEMENT AND EXERCISE OPPORTUNITIES  PWR! Moves Classes at [facility] Exercise Room.  Wednesdays 10 and 11 am.   Contact Amy Marriott, PT [email or web address] if interested.  Parkinson's Exercise Class offerings at [employer]. *TUESDAYS* (Chair yoga) and Wednesdays (PWR! Moves)  1:00 pm.    Contact Synetta Shadowhristy Weaver at [email or web address]    Parkinson's Wellness Recovery (PWR! Moves)  www.pwr4life.org  Info on the PWR! Virtual Experience:  You will have access to our expertise?through self-assessment, guided plans that start with the PD-specific fundamentals, educational content, tips, Q&A with an expert, and a growing Engineering geologistlibrary of PD-specific pre-recorded and live exercise classes of varying types and intensity - both physical and cognitive! If that is not enough, we  offer 1:1 wellness consultations (in-person or virtual) to personalize your PWR! Dance movement psychotherapistVirtual Experience.   Parkinson State [employer] Fitness Fridays:   As part of the PD Health @ Home program, this free video series focuses each week on one aspect of fitness designed to support people living with Parkinson's.? These weekly videos highlight the Parkinson Foundation fitness guidelines for people with Parkinson's disease.  MenusLocal.com.brwww.[email or web address]/resources-support/online-education/pdhealth#ff  Dance for PD website is offering free, live-stream classes throughout the week, as well as links to Parker Hannifindigital library of classes:  [email or web address]://danceforparkinsons.[email or web address]/  Virtual dance and Pilates for Parkinson's classes: Click on the Community Tab> Parkinson's Movement Initiative Tab.  To register for classes and for more information, visit www.[email or web address].[email or web address] and click the "community" tab.   [employer] Parkinson's Cycling Classes   [employer]:  Thursdays @ Noon-Live classes at [employer] (Hovnanian EnterprisesContact Margaret Hazen at [email or web address]?or ([phone])  [employer]: Classes Tuesday, Wednesday and Thursday (contact HermannMarlee at [email or web address] ?or [phone])  [facility] [employer]Steady Boxing  Varied levels of classes are offered Tuesdays and Thursdays at [employer].   Stretching with Byrd HesselbachMaria weekly class is also offered for people with Parkinson's  To observe a class or for more information, call [phone] or email Patricia NettleHillary Savage at [email or web address]   ADDITIONAL SUPPORT AND RESOURCES  Well[employer]Spring [employer]:  Online Caregiver Education Opportunities:  www.well-springsolutions.org/caregiver-education/caregiver-support-group.  You may also contact Loleta ChanceJodi Kolada at [facility] Surgery [email or web address] or [phone].     Well[employer]Spring [employer] April CHS [employer]fferings National HealthCare Decisions Day:  The Most Critical Legal and Medical Decisions to Consider Now!  Tuesday, April 16th, 1-3 pm at [facility] at [facility].  Contact Lennox LaityJodi  Kolada at [email or web address] or [phone] Powerful Tools for Caregivers.  6 week educational series for caregivers.  April 18-May 23, 10:30 am-12:15 pm at [facility] 3rd Floor Conference Room.   Contact 06-18-1968 at [email or web address] or [phone] to register Well-Spring Navigator:  Just1Navigator program, a?free service to help individuals and families through the journey of determining care for older adults.  The "Navigator" is a 09-11-1990, [phone], who will speak with a prospective client and/or loved ones to provide an assessment of the situation and a set of recommendations for a personalized care plan -- all free of charge, and whether?Well[employer]Spring [employer] offers the needed service or not. If the need is not a service we provide, we are well-connected with reputable programs in town that we can refer you to.  www.well-springsolutions.org or to speak with the Navigator, call [phone].

## 2022-05-31 ENCOUNTER — Other Ambulatory Visit: Payer: Self-pay

## 2022-05-31 MED ORDER — METOPROLOL TARTRATE 50 MG PO TABS: ORAL_TABLET | ORAL | 3 refills | Status: DC

## 2022-05-31 NOTE — Telephone Encounter (Signed)
This is a A-Fib clinic pt 

## 2022-06-13 ENCOUNTER — Other Ambulatory Visit (HOSPITAL_COMMUNITY): Payer: Self-pay | Admitting: Family Medicine

## 2022-06-13 DIAGNOSIS — Z0000 Encounter for general adult medical examination without abnormal findings: Secondary | ICD-10-CM | POA: Diagnosis not present

## 2022-06-13 DIAGNOSIS — G20A1 Parkinson's disease without dyskinesia, without mention of fluctuations: Secondary | ICD-10-CM | POA: Diagnosis not present

## 2022-06-13 DIAGNOSIS — I714 Abdominal aortic aneurysm, without rupture, unspecified: Secondary | ICD-10-CM | POA: Diagnosis not present

## 2022-06-13 DIAGNOSIS — G4700 Insomnia, unspecified: Secondary | ICD-10-CM | POA: Diagnosis not present

## 2022-06-13 DIAGNOSIS — E7800 Pure hypercholesterolemia, unspecified: Secondary | ICD-10-CM | POA: Diagnosis not present

## 2022-06-13 DIAGNOSIS — I4891 Unspecified atrial fibrillation: Secondary | ICD-10-CM | POA: Diagnosis not present

## 2022-06-13 DIAGNOSIS — C61 Malignant neoplasm of prostate: Secondary | ICD-10-CM | POA: Diagnosis not present

## 2022-06-13 DIAGNOSIS — D696 Thrombocytopenia, unspecified: Secondary | ICD-10-CM | POA: Diagnosis not present

## 2022-06-13 DIAGNOSIS — E538 Deficiency of other specified B group vitamins: Secondary | ICD-10-CM | POA: Diagnosis not present

## 2022-06-13 DIAGNOSIS — D6869 Other thrombophilia: Secondary | ICD-10-CM | POA: Diagnosis not present

## 2022-06-15 DIAGNOSIS — Z1211 Encounter for screening for malignant neoplasm of colon: Secondary | ICD-10-CM | POA: Diagnosis not present

## 2022-07-04 ENCOUNTER — Ambulatory Visit (HOSPITAL_COMMUNITY)
Admission: RE | Admit: 2022-07-04 | Discharge: 2022-07-04 | Disposition: A | Discharge status: Home / Self Care | Payer: Medicare PPO | Source: Ambulatory Visit | Attending: Family Medicine | Admitting: Family Medicine

## 2022-07-04 DIAGNOSIS — E7800 Pure hypercholesterolemia, unspecified: Secondary | ICD-10-CM | POA: Insufficient documentation

## 2022-07-31 ENCOUNTER — Ambulatory Visit (HOSPITAL_COMMUNITY): Payer: Medicare PPO | Admitting: Physician Assistant

## 2022-08-02 ENCOUNTER — Ambulatory Visit (HOSPITAL_COMMUNITY)
Admission: RE | Admit: 2022-08-02 | Discharge: 2022-08-02 | Disposition: A | Discharge status: Home / Self Care | Payer: Medicare PPO | Source: Ambulatory Visit | Attending: Physician Assistant | Admitting: Physician Assistant

## 2022-08-02 ENCOUNTER — Encounter (HOSPITAL_COMMUNITY): Payer: Self-pay | Admitting: Physician Assistant

## 2022-08-02 VITALS — BP 124/76 | HR 87 | Ht 71.0 in | Wt 217.0 lb

## 2022-08-02 DIAGNOSIS — I2510 Atherosclerotic heart disease of native coronary artery without angina pectoris: Secondary | ICD-10-CM | POA: Diagnosis not present

## 2022-08-02 DIAGNOSIS — Z7901 Long term (current) use of anticoagulants: Secondary | ICD-10-CM | POA: Insufficient documentation

## 2022-08-02 DIAGNOSIS — D6869 Other thrombophilia: Secondary | ICD-10-CM

## 2022-08-02 DIAGNOSIS — I4892 Unspecified atrial flutter: Secondary | ICD-10-CM | POA: Diagnosis not present

## 2022-08-02 DIAGNOSIS — Z79899 Other long term (current) drug therapy: Secondary | ICD-10-CM | POA: Insufficient documentation

## 2022-08-02 DIAGNOSIS — I4819 Other persistent atrial fibrillation: Secondary | ICD-10-CM | POA: Diagnosis not present

## 2022-08-02 DIAGNOSIS — I10 Essential (primary) hypertension: Secondary | ICD-10-CM | POA: Diagnosis not present

## 2022-08-02 NOTE — Progress Notes (Signed)
Primary Care Physician: [name], MD Referring Physician: [name] is a 67 y.o. male with a h/o afib 2015 found by primary. Was placed on flecainide 100 mg bid and has done well until seen 6/20 and was noted to be in aflutter with RVR.Because of breakthrough afib, he was scheduled for ablation 11/30/15.  F/u in afib clinic 3 13. He was pending shoulder surgery   and has been in afib x 2 weeks. Flecainide was decreased to 50 mg bid, as he was told he could try to wean off after cardioversion. and within several days he returned to afib.   After shoulder surgery, he did have afib for a couple of weeks and did return to SR on his own.  He is in the afib clinic 11/15/16. He has returned to afib since the beginning of September. He had been trying to wean of flecainide  again. He has has some tremors and when he tries to wean off drug, the tremors get better. He is toying with the idea of having  another ablation vrs a cardioversion but has guests this week into next week and will wait until then to make the decision. He is tolerating well and is rate controlled. No missed doses of eliquis. He wishes to pursue another ablation.  F/u in fib clinic after 2nd ablation 12/19/16. He felt well until yesterday PM when he noted some wheezing, shortness of breath and could not lie flat last night to sleep. HR has been regular. No fever, chills, chest pain, cough. His weight is noted to be up 10 lbs from my last clinic visit around one month ago. He feels bloated in his abdomen.  F/u in afib clinic 12/26/16. He was started on lasix on last visit for fluid overload following ablation. He felt well after the first dose of lasix and by Saturday night( 2 doses), he was able to lie flat to sleep. He did not take lasix this am weight is down 8 lbs. Maintaining SR.  Pt is here for one month f/u ablation. He has not noted any further afib. Weight is stable.Compliant with eliquis without any  missed doses.  Pt being seen in clinic after calling to office, being out of rhythm x 2 weeks. He feels the trigger was waiting almost 2 weeks for test results for his wife, who has a h/o cancer. She got better news  than was expected but she is still pending surgery in the near future. Pt had stopped flecainide for worsening tremor of left arm, improved off flecainide. He took two doses of flecainide when he got out of rhythm and tremor worsened, so he says that he will no longer try flecainide. He states that he has never had tremor evaluated by neurologist, has mentioned to PCP.  F/u in afib clinic, 4/16. S/p successful cardioversion. He feels well. EKG shows SR.   F/u in afib clinic 9/16. He called to the clinic last week and asked to  increase his metoprolol as his HR's were elevated. His EKG shows atrial flutter, rate controlled. He has had the stress of his wife recently undergoing breast CA surgery x 3, his mother recently died and his adult Autistic daughter has been having some issues.  F/u in clinic 12/03/17, he had successful cardioversion and is still maintaining SR.   F/u in afib clinic, 05/31/20.  I have not seen pt for several years. He was seen by Dr. [name] in early  March for pre op clearance for neck injury after a fall. He was found to be in afib at that time. He was given this appointment to see about scheduling DCCV 4 weeks after surgery if he remained out of rhythm. 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    He wife reports that he had a very difficult post op course complicated by post op delirium with aggressive behavior requiring restraints  and neurogenic bladder from  which he is still wearing a catheter. He is very weak and is supposed to  start physical therapy soon. He is rate controlled and does not seem to be bothered with his rhythm at this time. He does not wish to have any other  procedures until he is back to his baseline from surgery.   F/u in afib clinic, 09/30/20. He had  BPH with LUTS in May and bx came back normal. He is in SR today and for the most part has felt very well since the surgery. There was a question of CV if afib persisted but it appears he is in SR most of the time now. He is getting ready to fly to [place] for his daughter's wedding. He has a fear of flying and we discussed possibly afib could be induced by anxiety or higher elevation and to make sure he takes his medicine. He  lost 28 lbs since the the 2 surgeries and is going to the gym to maintain.   F/u in afib clinic, 04/14/21. He feels a few weeks ago he returned to afib, rate controlled. He felt fatigued for the first few days but now states that he is not bothered with it. We discussed options to restore SR but for now he wants a rate control strategy.   F/u in afib clinic 11/01/21. He remains in rate controlled afib and wants to continue with rate control as his long term goal. He states that he is not symptomatic.   Follow up in the AF clinic 08/02/22. Patient is in rate controlled asymptomatic afib. No bleeding issues on anticoagulation. He has been diagnosed with Parkinson's disease.   Today, he denies symptoms of palpitations, chest pain, shortness of breath, orthopnea, PND, lower extremity edema, dizziness, presyncope, syncope. The patient is tolerating medications without difficulties and is otherwise without complaint today.   Past Medical History:  Diagnosis Date   Acoustic neuroma (HCC)    left   Anxiety    Arthritis    Complication of anesthesia    Delusion, violent, hallucination, hospitalized 8 days, urine retention   Deafness in left ear    Delusion (HCC)    after fall on pain meds and prednisone   Dysrhythmia    PAF   Foley catheter in place    H/O cardiac radiofrequency ablation    HTN (hypertension)    Hypercholesterolemia    Hypopotassemia    Insomnia    Paroxysmal  atrial fibrillation (HCC)    Radiculopathy    after fall   Tremor    left hand   Vitamin B 12 deficiency    Past Surgical History:  Procedure Laterality Date   ACOUSTIC NEUROMA RESECTION Left 19990   ANTERIOR CERVICAL DECOMP/DISCECTOMY FUSION N/A 04/26/2020   Procedure: Anterior Cervical Decompression Fusion - Cervical five-Cervical six - Cervical six-Cervical seven;  Surgeon: Donalee Citrin, MD;  [facility];  Service: Neurosurgery;  Laterality: N/A;   APPENDECTOMY  1976   ATRIAL FIBRILLATION  ABLATION N/A 12/19/2016   Procedure: ATRIAL FIBRILLATION ABLATION;  Surgeon: Hillis Range, MD;  Location: [facility] INVASIVE CV LAB;  Service: Cardiovascular;  Laterality: N/A;   CARDIAC CATHETERIZATION N/A 10/07/2015   Procedure: Left Heart Cath and Coronary Angiography;  Surgeon: Wendall Stade, MD;  [facility]VAS[facility] CV LAB;  Service: Cardiovascular;  Laterality: N/A;   CARDIOVERSION N/A 06/17/2013   Procedure: CARDIOVERSION;  Surgeon: Wendall Stade, MD;  Location[facility] ENDOSCOPY;  Service: Cardiovascular;  Laterality: N/A;   CARDIOVERSION N/A 05/11/2016   Procedure: CARDIOVERSION;  Surgeon: Thurmon Fair, MD;  Location: [facility] ENDOSCOPY;  Service: Cardiovascular;  Laterality: N/A;   CARDIOVERSION N/A 05/24/2017   Procedure: CARDIOVERSION;  Surgeon: Quintella Reichert, MD;  [facility];  Service: Cardiovascular;  Laterality: N/A;   CARDIOVERSION N/A 11/19/2017   Procedure: CARDIOVERSION;  Surgeon: Chrystie Nose, MD;  Location: [facility] ENDOSCOPY;  Service: Cardiovascular;  Laterality: N/A;   CHOLECYSTECTOMY  1976   CLUB FOOT RELEASE  1968   ELECTROPHYSIOLOGIC STUDY N/A 11/30/2015   Procedure: Atrial Fibrillation Ablation;  Surgeon: Hillis Range, MD;  [facility] INVASIVE CV LAB;  Service: Cardiovascular;  Laterality: N/A;   TEE WITHOUT CARDIOVERSION N/A 05/11/2016   Procedure: TRANSESOPHAGEAL ECHOCARDIOGRAM (TEE);  Surgeon: Thurmon Fair, MD;  Location: [facility] ENDOSCOPY;  Service: Cardiovascular;   Laterality: N/A;   TONSILLECTOMY  1967   TOTAL SHOULDER ARTHROPLASTY Left 06/15/2016   Procedure: TOTAL SHOULDER ARTHROPLASTY;  Surgeon: Jones Broom, MD;  Location: [facility] OR;  Service: Orthopedics;  Laterality: Left;  Left total shoulder arthroplasty   TRANSURETHRAL RESECTION OF PROSTATE N/A 07/02/2020   Procedure: TRANSURETHRAL RESECTION OF THE PROSTATE (TURP);  Surgeon: Crista Elliot, MD;  Location: [facility] ORS;  Service: Urology;  Laterality: N/A;    Current Outpatient Medications  Medication Sig Dispense Refill   AMBULATORY NON FORMULARY MEDICATION Medication Name: Neuralli     amLODipine (NORVASC) 10 MG tablet Take 10 mg by mouth daily.     apixaban (ELIQUIS) 5 MG TABS tablet Take 1 tablet by mouth twice daily 180 tablet 2   atorvastatin (LIPITOR) 20 MG tablet Take 20 mg by mouth daily.     carbidopa-levodopa (SINEMET IR) 25-100 MG tablet Take 1 tablet by mouth 3 (three) times daily. 6am/11am/4pm 270 tablet 1   Cholecalciferol (VITAMIN D-3) 5000 units TABS Take 5,000 Units by mouth daily.     Coenzyme Q10 (COQ10) 100 MG CAPS Take 100 mg by mouth daily.     metoprolol tartrate (LOPRESSOR) 50 MG tablet TAKE 1 TABLET BY MOUTH TWICE DAILY. MAY TAKE AN EXTRA 1/2 TABLET AS NEEDED FOR BREAKTHROUGH AFIB 200 tablet 3   olmesartan-hydrochlorothiazide (BENICAR HCT) 40-25 MG tablet Take 1 tablet by mouth daily.     potassium chloride SA (KLOR-CON M) 20 MEQ tablet Take 2 tablet (40 meq) by mouth in the morning & take 1 tablet (20 meq) by mouth in the evening.     sildenafil (REVATIO) 20 MG tablet Take 20-100 mg by mouth daily as needed for erectile dysfunction.  0   traZODone (DESYREL) 50 MG tablet Take 50 mg by mouth at bedtime.     zinc gluconate 50 MG tablet Take 50 mg by mouth daily.     Multiple Vitamin (MULTIVITAMIN) tablet Take 2 tablets by mouth daily. (Patient not taking: Reported on 08/02/2022)     No current facility-administered medications for this encounter.    Allergies  Allergen  Reactions   Hydrocodone Other (See Comments)   Other Other (See  Comments)    opioids hallucination   Oxycodone Other (See Comments)    hallucinations   Prednisolone Other (See Comments)   Penicillins Rash and Other (See Comments)    Has patient had a PCN reaction causing immediate rash, facial/tongue/throat swelling, SOB or lightheadedness with hypotension: No Has patient had a PCN reaction causing severe rash involving mucus membranes or skin necrosis: No Has patient had a PCN reaction that required hospitalization No Has patient had a PCN reaction occurring within the last 10 years: No If all of the above answers are "NO", then may proceed with Cephalosporin use.     Social History   Socioeconomic History   Marital status: Married    Spouse name: Not on file   Number of children: Not on file   Years of education: Not on file   Highest education level: Not on file  Occupational History   Occupation: retired    Comment: vocational rehab  Tobacco Use   Smoking status: Former   Smokeless tobacco: Never   Tobacco comments:    smoke in college- social  Vaping Use   Vaping Use: Never used  Substance and Sexual Activity   Alcohol use: Yes    Alcohol/week: 2.0 - 3.0 standard drinks of alcohol    Types: 2 - 3 Cans of beer per week   Drug use: No   Sexual activity: Not on file  Other Topics Concern   Not on file  Social History Narrative   Lives in [place].  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   Left handed    Retired   Chemical engineer Strain: Not on file  Food Insecurity: Not on file  Transportation Needs: Not on file  Physical Activity: Not on file  Stress: Not on file  Social Connections: Not on file  Intimate Partner Violence: Not on file    Family History  Problem Relation Age of Onset   Hypertension Mother    Stroke Mother    CVA Mother    Tremor Mother    Hypertension Father    Hypertension Brother     Hypercholesterolemia Brother    Atrial fibrillation Brother    Congestive Heart Failure Brother    Asperger's syndrome Child     ROS- All systems are reviewed and negative except as per the HPI above  Physical Exam: Vitals:   08/02/22 1420  BP: 124/76  Pulse: 87  Weight: 98.4 kg  Height: 5\' 11"  (1.803 m)     GEN- The patient is a well appearing male, alert and oriented x 3 today.   HEENT-head normocephalic, atraumatic, sclera clear, conjunctiva pink, hearing intact, trachea midline. Lungs- Clear to ausculation bilaterally, normal work of breathing Heart- irregular rate and rhythm, no murmurs, rubs or gallops  GI- soft, NT, ND, + BS Extremities- no clubbing, cyanosis, or edema MS- no significant deformity or atrophy Skin- no rash or lesion Psych- euthymic mood, full affect Neuro- R hand tremor    EKG today demonstrates Afib (w/ tremor) Vent. rate 87 BPM PR interval * ms QRS duration 90 ms QT/QTcB 358/430 ms   Echo 05/02/21  1. Left ventricular ejection fraction, by estimation, is 55 to 60%. The  left ventricle has normal function. The left ventricle has no regional  wall motion abnormalities. There is mild left ventricular hypertrophy.  Left ventricular diastolic parameters are indeterminate.   2. Right ventricular systolic function is normal. The right ventricular  size is normal.  3. The mitral valve is normal in structure. Trivial mitral valve  regurgitation.   4. The aortic valve was not well visualized. Aortic valve regurgitation  is trivial. No aortic stenosis is present.   5. Aortic dilatation noted. There is dilatation of the ascending aorta,  measuring 40 mm.   6. The inferior vena cava is normal in size with greater than 50%  respiratory variability, suggesting right atrial pressure of 3 mmHg.     CHA2DS2-VASc Score = 3  The patient's score is based upon: CHF History: 0 HTN History: 1 Diabetes History: 0 Stroke History: 0 Vascular Disease History:  1 Age Score: 1 Gender Score: 0       ASSESSMENT AND PLAN: 1. Persistent Atrial Fibrillation/atrial flutter The patient's CHA2DS2-VASc score is 3, indicating a 3.2% annual risk of stroke.   H/o  atrial flutter, s/p afib ablation 11/30/15 and 12/19/16 Patient has opted for a rate control strategy.  Continue Eliquis 5 mg BID Continue Lopressor 50 mg BID  2. Secondary Hypercoagulable State (ICD10:  D68.69) The patient is at significant risk for stroke/thromboembolism based upon his CHA2DS2-VASc Score of 3.  Continue Apixaban (Eliquis).   3. HTN Stable, no changes today.  4. CAD/aortic atherosclerosis  CAC score 622 on CT On statin No anginal symptoms.    Will refer him to reestablish care with a primary cardiologist. AF clinic as needed.    Jorja Loa PA-C Afib Clinic [facility] [address] [place], [place] [address] [phone]

## 2022-08-14 ENCOUNTER — Telehealth: Payer: Self-pay | Admitting: Physical Therapy

## 2022-08-14 DIAGNOSIS — G20A1 Parkinson's disease without dyskinesia, without mention of fluctuations: Secondary | ICD-10-CM

## 2022-08-14 NOTE — Telephone Encounter (Signed)
Dr. Arbutus Leas,  [place] is scheduled for  PT return evaluation in July as recommended when he was last discharged from therapy due to progressive nature of diagnosis.  Pt was in agreement with this plan.  If you are in agreement, please send updated order for PT via epic.  Thank you,  Lonia Blood, PT 08/14/22 8:15 AM Phone: [phone] Fax: [phone]  [facility] [address] [place], [address] [place], [place] [address] Phone # [phone] Fax # [phone]

## 2022-08-29 ENCOUNTER — Encounter: Payer: Self-pay | Admitting: Physical Therapy

## 2022-08-29 ENCOUNTER — Other Ambulatory Visit: Payer: Self-pay

## 2022-08-29 ENCOUNTER — Ambulatory Visit: Payer: Medicare PPO | Attending: Neurology | Admitting: Physical Therapy

## 2022-08-29 DIAGNOSIS — G20A1 Parkinson's disease without dyskinesia, without mention of fluctuations: Secondary | ICD-10-CM | POA: Insufficient documentation

## 2022-08-29 DIAGNOSIS — R29818 Other symptoms and signs involving the nervous system: Secondary | ICD-10-CM | POA: Insufficient documentation

## 2022-08-29 NOTE — Therapy (Signed)
OUTPATIENT PHYSICAL THERAPY NEURO EVALUATION   Patient Name: Patrick [place] MRN: [id number] DOB:[birth year], 67 y.o., male Today's Date: 08/29/2022   PCP: [name], MD REFERRING PROVIDER: Vladimir Faster, DO   END OF SESSION:  PT End of Session - 08/29/22 1110     Visit Number 1    Number of Visits 1    Authorization Type [employer] (eval only)    PT Start Time 1106    PT Stop Time 1145    PT Time Calculation (min) 39 min    Activity Tolerance Patient tolerated treatment well    Behavior During Therapy WFL for tasks assessed/performed             Past Medical History:  Diagnosis Date   Acoustic neuroma (HCC)    left   Anxiety    Arthritis    Complication of anesthesia    Delusion, violent, hallucination, hospitalized 8 days, urine retention   Deafness in left ear    Delusion (HCC)    after fall on pain meds and prednisone   Dysrhythmia    PAF   Foley catheter in place    H/O cardiac radiofrequency ablation    HTN (hypertension)    Hypercholesterolemia    Hypopotassemia    Insomnia    Paroxysmal atrial fibrillation (HCC)    Radiculopathy    after fall   Tremor    left hand   Vitamin B 12 deficiency    Past Surgical History:  Procedure Laterality Date   ACOUSTIC NEUROMA RESECTION Left 19990   ANTERIOR CERVICAL DECOMP/DISCECTOMY FUSION N/A 04/26/2020   Procedure: Anterior Cervical Decompression Fusion - Cervical five-Cervical six - Cervical six-Cervical seven;  Surgeon: Donalee Citrin, MD;  Location: [facility] OR;  Service: Neurosurgery;  Laterality: N/A;   APPENDECTOMY  1976   ATRIAL FIBRILLATION ABLATION N/A 12/19/2016   Procedure: ATRIAL FIBRILLATION ABLATION;  Surgeon: Hillis Range, MD;  Location: [facility] INVASIVE CV LAB;  Service: Cardiovascular;  Laterality: N/A;   CARDIAC CATHETERIZATION N/A 10/07/2015   Procedure: Left Heart Cath and Coronary Angiography;  Surgeon: Wendall Stade, MD;  Location: [facility] INVASIVE CV LAB;  Service: Cardiovascular;  Laterality: N/A;    CARDIOVERSION N/A 06/17/2013   Procedure: CARDIOVERSION;  Surgeon: Wendall Stade, MD;  Location: [facility] ENDOSCOPY;  Service: Cardiovascular;  Laterality: N/A;   CARDIOVERSION N/A 05/11/2016   Procedure: CARDIOVERSION;  Surgeon: Thurmon Fair, MD;  Location: [facility] ENDOSCOPY;  Service: Cardiovascular;  Laterality: N/A;   CARDIOVERSION N/A 05/24/2017   Procedure: CARDIOVERSION;  Surgeon: Quintella Reichert, MD;  Location: [facility] ENDOSCOPY;  Service: Cardiovascular;  Laterality: N/A;   CARDIOVERSION N/A 11/19/2017   Procedure: CARDIOVERSION;  Surgeon: Chrystie Nose, MD;  Location: [facility] ENDOSCOPY;  Service: Cardiovascular;  Laterality: N/A;   CHOLECYSTECTOMY  1976   CLUB FOOT RELEASE  1968   ELECTROPHYSIOLOGIC STUDY N/A 11/30/2015   Procedure: Atrial Fibrillation Ablation;  Surgeon: Hillis Range, MD;  Location: [facility] INVASIVE CV LAB;  Service: Cardiovascular;  Laterality: N/A;   TEE WITHOUT CARDIOVERSION N/A 05/11/2016   Procedure: TRANSESOPHAGEAL ECHOCARDIOGRAM (TEE);  Surgeon: Thurmon Fair, MD;  Location: [facility] ENDOSCOPY;  Service: Cardiovascular;  Laterality: N/A;   TONSILLECTOMY  1967   TOTAL SHOULDER ARTHROPLASTY Left 06/15/2016   Procedure: TOTAL SHOULDER ARTHROPLASTY;  Surgeon: Jones Broom, MD;  Location: [facility] OR;  Service: Orthopedics;  Laterality: Left;  Left total shoulder arthroplasty   TRANSURETHRAL RESECTION OF PROSTATE N/A 07/02/2020   Procedure: TRANSURETHRAL RESECTION OF THE PROSTATE (TURP);  Surgeon: Alvester Morin,  Jannett Celestine, MD;  Location: [facility] ORS;  Service: Urology;  Laterality: N/A;   Patient Active Problem List   Diagnosis Date Noted   Hypercoagulable state due to persistent atrial fibrillation (HCC) 08/02/2022   Parkinson's disease without dyskinesia or fluctuating manifestations 12/21/2021   BPH (benign prostatic hyperplasia) 07/02/2020   Myelopathy (HCC) 04/26/2020   Atrial flutter (HCC) 12/19/2016   S/P shoulder replacement, left 06/15/2016   A-fib (HCC) 11/30/2015   HTN (hypertension)     Hypercholesterolemia    Deafness in left ear    Atrial fibrillation (HCC)    Hypopotassemia     ONSET DATE: 08/14/2022 (MD referral)  REFERRING DIAG: G20.A1 (ICD-10-CM) - Parkinson's disease without dyskinesia or fluctuating manifestations  THERAPY DIAG:  Other symptoms and signs involving the nervous system  Rationale for Evaluation and Treatment: Rehabilitation  SUBJECTIVE:                                                                                                                                                                                             SUBJECTIVE STATEMENT: Just don't feel as strong as I was, especially through my arms.  No falls.  Been doing the exercises, and trying to work on my core. Pt accompanied by: self and significant other  PERTINENT HISTORY: L acoustic neuroma s/p resection 2022, L ear deafness, HTN, HLD, a-fib, cervical fusion 2022, L TSA 2018   PAIN:  Are you having pain? No  PRECAUTIONS: Fall  WEIGHT BEARING RESTRICTIONS: No  FALLS: Has patient fallen in last 6 months? No  LIVING ENVIRONMENT: Lives with: lives with their spouse Lives in: House/apartment Stairs: Yes: External: 4-5 steps; on right going up and on left going up Has following equipment at home: Single point cane  PLOF: Independent doing exercises, does stationary bike daily  PATIENT GOALS: My main goal is to stay as much the same as I can.  OBJECTIVE:   DIAGNOSTIC FINDINGS: NA for this episode  COGNITION: Overall cognitive status: Within functional limits for tasks assessed   SENSATION: Light touch: WFL   POSTURE: rounded shoulders and forward head  LOWER EXTREMITY ROM:   WFL BLE  Active  Right Eval Left Eval  Hip flexion    Hip extension    Hip abduction    Hip adduction    Hip internal rotation    Hip external rotation    Knee flexion    Knee extension    Ankle dorsiflexion    Ankle plantarflexion    Ankle inversion    Ankle eversion      (Blank rows = not tested)  LOWER EXTREMITY MMT:  MMT Right Eval Left Eval  Hip flexion 4+ 4+  Hip extension    Hip abduction    Hip adduction    Hip internal rotation    Hip external rotation    Knee flexion 4+ 4+  Knee extension 5 4  Ankle dorsiflexion 4+ 4+  Ankle plantarflexion    Ankle inversion    Ankle eversion    (Blank rows = not tested)  TRANSFERS: Assistive device utilized: None  Sit to stand: Modified independence Stand to sit: Modified independence GAIT: Gait pattern: step through pattern and decreased arm swing- Left Distance walked: 50 ft x 4 reps Assistive device utilized: None Level of assistance: Modified independence  FUNCTIONAL TESTS:  5 times sit to stand: 8.78 sec Timed up and go (TUG): 10.63 sec TUG cog: 16.56 sec 42M: 8.63 sec = 3.8 ft/sec MiniBESTest:  24/28   [facility] PT Assessment - 08/29/22 0001       Mini-BESTest   Sit To Stand Normal: Comes to stand without use of hands and stabilizes independently.    Rise to Toes Normal: Stable for 3 s with maximum height.    Stand on one leg (left) Moderate: < 20 s   12.37, 15.91   Stand on one leg (right) Moderate: < 20 s   9.78, 8.47   Stand on one leg - lowest score 1    Compensatory Stepping Correction - Forward Normal: Recovers independently with a single, large step (second realignement is allowed).    Compensatory Stepping Correction - Backward Normal: Recovers independently with a single, large step    Compensatory Stepping Correction - Left Lateral Normal: Recovers independently with 1 step (crossover or lateral OK)    Compensatory Stepping Correction - Right Lateral Normal: Recovers independently with 1 step (crossover or lateral OK)    Stepping Corredtion Lateral - lowest score 2    Stance - Feet together, eyes open, firm surface  Normal: 30s    Stance - Feet together, eyes closed, foam surface  Moderate: < 30s   hx of vestibular schwannoma   Incline - Eyes Closed Normal: Stands  independently 30s and aligns with gravity    Change in Gait Speed Normal: Significantly changes walkling speed without imbalance    Walk with head turns - Horizontal Moderate: performs head turns with reduction in gait speed.    Walk with pivot turns Normal: Turns with feet close FAST (< 3 steps) with good balance.    Step over obstacles Normal: Able to step over box with minimal change of gait speed and with good balance.    Timed UP & GO with Dual Task Moderate: Dual Task affects either counting OR walking (>10%) when compared to the TUG without Dual Task.    Mini-BEST total score 24              TODAY'S TREATMENT:  DATE: 08/29/2022    PATIENT EDUCATION: Education details: Eval results, POC-no PT needed at this time; recommend continue HEP/exercises and aerobic activity with stationary bike at home Person educated: Patient and Spouse Education method: Explanation Education comprehension: verbalized understanding    GOALS: NA (1 visit/eval only)  ASSESSMENT:  CLINICAL IMPRESSION: Patient is a 67 y.o. male who was seen today for return physical therapy evaluation and treatment for Parkinson's disease.  He reports doing exercises regularly as well as using stationary bike for aerobic activity daily.  Wife present and endorses she helps with exercises regularly.  He reports no falls, no noted slowing since last bout of PT, which ended 02/2022.  With objective measures taken today, pt demonstrates scores consistent with scores at discharge.  He does not demonstrate fall risk per MiniBESTest, gait velocity or FTSTS scores.  He demonstrates slowed score with TUG/cognitive TUG, indicating difficulty with dual tasking, compared to score at discharge.  Pt and wife feel good about continueing current exercise program and agree to return PT eval in 6 months.  REHAB  POTENTIAL: Good  CLINICAL DECISION MAKING: Stable/uncomplicated  EVALUATION COMPLEXITY: Low  PLAN:  PT FREQUENCY:  eval only  PT DURATION: other: eval only  PLAN FOR NEXT SESSION: return eval in 6 months due to progressive nature of disease process.   Gean Maidens., PT 08/29/2022, 3:03 PM  [facility] at [facility] [address] [place], [address] [place], [place] [address] Phone # [phone] Fax # [phone]

## 2022-10-05 ENCOUNTER — Encounter (HOSPITAL_COMMUNITY): Payer: Self-pay

## 2022-10-05 ENCOUNTER — Emergency Department (HOSPITAL_COMMUNITY): Payer: Medicare PPO

## 2022-10-05 ENCOUNTER — Other Ambulatory Visit: Payer: Self-pay

## 2022-10-05 ENCOUNTER — Emergency Department (HOSPITAL_COMMUNITY)
Admission: EM | Admit: 2022-10-05 | Discharge: 2022-10-05 | Disposition: A | Discharge status: Home / Self Care | Payer: Medicare PPO | Attending: Emergency Medicine | Admitting: Emergency Medicine

## 2022-10-05 DIAGNOSIS — N289 Disorder of kidney and ureter, unspecified: Secondary | ICD-10-CM | POA: Diagnosis not present

## 2022-10-05 DIAGNOSIS — S40812A Abrasion of left upper arm, initial encounter: Secondary | ICD-10-CM | POA: Diagnosis not present

## 2022-10-05 DIAGNOSIS — Z981 Arthrodesis status: Secondary | ICD-10-CM | POA: Diagnosis not present

## 2022-10-05 DIAGNOSIS — G20C Parkinsonism, unspecified: Secondary | ICD-10-CM | POA: Diagnosis not present

## 2022-10-05 DIAGNOSIS — S0990XA Unspecified injury of head, initial encounter: Secondary | ICD-10-CM | POA: Diagnosis not present

## 2022-10-05 DIAGNOSIS — Z79899 Other long term (current) drug therapy: Secondary | ICD-10-CM | POA: Diagnosis not present

## 2022-10-05 DIAGNOSIS — I700 Atherosclerosis of aorta: Secondary | ICD-10-CM | POA: Insufficient documentation

## 2022-10-05 DIAGNOSIS — Z7901 Long term (current) use of anticoagulants: Secondary | ICD-10-CM | POA: Insufficient documentation

## 2022-10-05 DIAGNOSIS — Y92410 Unspecified street and highway as the place of occurrence of the external cause: Secondary | ICD-10-CM | POA: Diagnosis not present

## 2022-10-05 DIAGNOSIS — S80812A Abrasion, left lower leg, initial encounter: Secondary | ICD-10-CM | POA: Diagnosis not present

## 2022-10-05 DIAGNOSIS — Z87891 Personal history of nicotine dependence: Secondary | ICD-10-CM | POA: Insufficient documentation

## 2022-10-05 DIAGNOSIS — S3991XA Unspecified injury of abdomen, initial encounter: Secondary | ICD-10-CM | POA: Diagnosis not present

## 2022-10-05 DIAGNOSIS — S4980XA Other specified injuries of shoulder and upper arm, unspecified arm, initial encounter: Secondary | ICD-10-CM | POA: Diagnosis not present

## 2022-10-05 DIAGNOSIS — S199XXA Unspecified injury of neck, initial encounter: Secondary | ICD-10-CM | POA: Diagnosis not present

## 2022-10-05 DIAGNOSIS — E876 Hypokalemia: Secondary | ICD-10-CM | POA: Insufficient documentation

## 2022-10-05 DIAGNOSIS — S3993XA Unspecified injury of pelvis, initial encounter: Secondary | ICD-10-CM | POA: Diagnosis not present

## 2022-10-05 DIAGNOSIS — I10 Essential (primary) hypertension: Secondary | ICD-10-CM | POA: Diagnosis not present

## 2022-10-05 DIAGNOSIS — M546 Pain in thoracic spine: Secondary | ICD-10-CM | POA: Diagnosis present

## 2022-10-05 DIAGNOSIS — S299XXA Unspecified injury of thorax, initial encounter: Secondary | ICD-10-CM | POA: Diagnosis not present

## 2022-10-05 LAB — I-STAT CHEM 8, ED
BUN: 15 mg/dL (ref 8–23)
Calcium, Ion: 1.27 mmol/L (ref 1.15–1.40)
Chloride: 103 mmol/L (ref 98–111)
Creatinine, Ser: 0.8 mg/dL (ref 0.61–1.24)
Glucose, Bld: 121 mg/dL — ABNORMAL HIGH (ref 70–99)
HCT: 42 % (ref 39.0–52.0)
Hemoglobin: 14.3 g/dL (ref 13.0–17.0)
Potassium: 3.2 mmol/L — ABNORMAL LOW (ref 3.5–5.1)
Sodium: 141 mmol/L (ref 135–145)
TCO2: 24 mmol/L (ref 22–32)

## 2022-10-05 LAB — COMPREHENSIVE METABOLIC PANEL
ALT: 17 U/L (ref 0–44)
AST: 34 U/L (ref 15–41)
Albumin: 4 g/dL (ref 3.5–5.0)
Alkaline Phosphatase: 58 U/L (ref 38–126)
Anion gap: 12 (ref 5–15)
BUN: 13 mg/dL (ref 8–23)
CO2: 24 mmol/L (ref 22–32)
Calcium: 9.8 mg/dL (ref 8.9–10.3)
Chloride: 102 mmol/L (ref 98–111)
Creatinine, Ser: 0.84 mg/dL (ref 0.61–1.24)
GFR, Estimated: >60 mL/min (ref >60)
Glucose, Bld: 117 mg/dL — ABNORMAL HIGH (ref 70–99)
Potassium: 3.1 mmol/L — ABNORMAL LOW (ref 3.5–5.1)
Sodium: 138 mmol/L (ref 135–145)
Total Bilirubin: 1.2 mg/dL (ref 0.3–1.2)
Total Protein: 6.7 g/dL (ref 6.5–8.1)

## 2022-10-05 LAB — CBC
HCT: 41.8 % (ref 39.0–52.0)
Hemoglobin: 15.1 g/dL (ref 13.0–17.0)
MCH: 31.3 pg (ref 26.0–34.0)
MCHC: 36.1 g/dL — ABNORMAL HIGH (ref 30.0–36.0)
MCV: 86.7 fL (ref 80.0–100.0)
Platelets: 130 10*3/uL — ABNORMAL LOW (ref 150–400)
RBC: 4.82 MIL/uL (ref 4.22–5.81)
RDW: 13.2 % (ref 11.5–15.5)
WBC: 5.4 10*3/uL (ref 4.0–10.5)
nRBC: 0 % (ref 0.0–0.2)

## 2022-10-05 MED ORDER — IOHEXOL 350 MG/ML SOLN
75.0000 mL | Freq: Once | INTRAVENOUS | Status: AC | PRN
  Administered 2022-10-05: 75 mL via INTRAVENOUS

## 2022-10-05 MED ORDER — POTASSIUM CHLORIDE CRYS ER 20 MEQ PO TBCR
40.0000 meq | EXTENDED_RELEASE_TABLET | Freq: Once | ORAL | Status: AC
  Administered 2022-10-05: 40 meq via ORAL
  Filled 2022-10-05: qty 2
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
## 2022-10-07 DIAGNOSIS — S40812A Abrasion of left upper arm, initial encounter: Secondary | ICD-10-CM | POA: Diagnosis not present

## 2022-10-13 ENCOUNTER — Other Ambulatory Visit: Payer: Self-pay | Admitting: Family Medicine

## 2022-10-13 DIAGNOSIS — N289 Disorder of kidney and ureter, unspecified: Secondary | ICD-10-CM

## 2022-10-17 ENCOUNTER — Encounter: Payer: Self-pay | Admitting: Cardiology

## 2022-10-17 ENCOUNTER — Ambulatory Visit: Payer: Medicare PPO | Attending: Cardiology | Admitting: Cardiology

## 2022-10-17 VITALS — BP 140/86 | HR 84 | Ht 71.0 in | Wt 215.0 lb

## 2022-10-17 DIAGNOSIS — I10 Essential (primary) hypertension: Secondary | ICD-10-CM | POA: Diagnosis not present

## 2022-10-17 DIAGNOSIS — G20A1 Parkinson's disease without dyskinesia, without mention of fluctuations: Secondary | ICD-10-CM

## 2022-10-17 DIAGNOSIS — D6869 Other thrombophilia: Secondary | ICD-10-CM

## 2022-10-17 DIAGNOSIS — I4819 Other persistent atrial fibrillation: Secondary | ICD-10-CM

## 2022-10-17 NOTE — Patient Instructions (Signed)
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
## 2022-10-19 ENCOUNTER — Ambulatory Visit: Payer: Medicare PPO | Attending: Neurology | Admitting: Occupational Therapy

## 2022-10-19 DIAGNOSIS — R29818 Other symptoms and signs involving the nervous system: Secondary | ICD-10-CM | POA: Insufficient documentation

## 2022-10-19 NOTE — Therapy (Signed)
[place] [facility] [address] [place], [place], [address] Phone: [phone]   Fax:  [phone]  Patient Details  Name: Patrick Gray MRN: [id number] Date of Birth: 10/01/1955 Referring Provider:  [name], MD  Encounter Date: 10/19/2022  Occupational Therapy Parkinson's Disease Screen  Hand dominance:  Left    Physical Performance Test item #2 (simulated eating):  10.84 sec  9-hole peg test:    RUE  18.56 sec        LUE  23.47 sec  Box & Blocks Test:   RUE  62 blocks        LUE  52 blocks  L shoulder flexion: 100*, elbow extension -10*  Other Comments:  "I feel like I"m holding my own." Pt reports that he is still performing exercises and the stationary bike.  Spouse reports that he is doing a lot of keyboarding.    Pt does not require occupational therapy services at this time.  Recommended occupational therapy screen in   6-8 months    [place], [place], OT 10/19/2022, 11:33 AM  [place] [address] [place], [place], [address] Phone: [phone]   Fax:  [phone]

## 2022-11-14 DIAGNOSIS — D2272 Melanocytic nevi of left lower limb, including hip: Secondary | ICD-10-CM | POA: Diagnosis not present

## 2022-11-14 DIAGNOSIS — D2262 Melanocytic nevi of left upper limb, including shoulder: Secondary | ICD-10-CM | POA: Diagnosis not present

## 2022-11-14 DIAGNOSIS — L578 Other skin changes due to chronic exposure to nonionizing radiation: Secondary | ICD-10-CM | POA: Diagnosis not present

## 2022-11-14 DIAGNOSIS — D2261 Melanocytic nevi of right upper limb, including shoulder: Secondary | ICD-10-CM | POA: Diagnosis not present

## 2022-11-14 DIAGNOSIS — X32XXXA Exposure to sunlight, initial encounter: Secondary | ICD-10-CM | POA: Diagnosis not present

## 2022-11-14 DIAGNOSIS — D225 Melanocytic nevi of trunk: Secondary | ICD-10-CM | POA: Diagnosis not present

## 2022-11-14 DIAGNOSIS — B360 Pityriasis versicolor: Secondary | ICD-10-CM | POA: Diagnosis not present

## 2022-11-14 DIAGNOSIS — D2271 Melanocytic nevi of right lower limb, including hip: Secondary | ICD-10-CM | POA: Diagnosis not present

## 2022-11-14 DIAGNOSIS — L821 Other seborrheic keratosis: Secondary | ICD-10-CM | POA: Diagnosis not present

## 2022-11-25 ENCOUNTER — Ambulatory Visit
Admission: RE | Admit: 2022-11-25 | Discharge: 2022-11-25 | Disposition: A | Discharge status: Home / Self Care | Payer: Medicare PPO | Source: Ambulatory Visit | Attending: Family Medicine | Admitting: Family Medicine

## 2022-11-25 DIAGNOSIS — N289 Disorder of kidney and ureter, unspecified: Secondary | ICD-10-CM | POA: Diagnosis not present

## 2022-11-25 MED ORDER — GADOPICLENOL 0.5 MMOL/ML IV SOLN
10.0000 mL | Freq: Once | INTRAVENOUS | Status: AC | PRN
  Administered 2022-11-25: 10 mL via INTRAVENOUS

## 2022-11-25 MED ORDER — GADOPICLENOL 0.5 MMOL/ML IV SOLN: 10.0000 mL | Freq: Once | INTRAVENOUS | Status: DC | PRN

## 2022-11-27 ENCOUNTER — Other Ambulatory Visit: Payer: Self-pay | Admitting: Neurology

## 2022-11-27 DIAGNOSIS — G20A1 Parkinson's disease without dyskinesia, without mention of fluctuations: Secondary | ICD-10-CM
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
## 2022-12-04 ENCOUNTER — Ambulatory Visit: Payer: Medicare PPO | Admitting: Neurology

## 2022-12-04 ENCOUNTER — Encounter: Payer: Self-pay | Admitting: Neurology

## 2022-12-04 DIAGNOSIS — Z029 Encounter for administrative examinations, unspecified: Secondary | ICD-10-CM

## 2022-12-12 DIAGNOSIS — G20A1 Parkinson's disease without dyskinesia, without mention of fluctuations: Secondary | ICD-10-CM | POA: Diagnosis not present

## 2022-12-12 DIAGNOSIS — E7800 Pure hypercholesterolemia, unspecified: Secondary | ICD-10-CM | POA: Diagnosis not present

## 2022-12-12 DIAGNOSIS — D6869 Other thrombophilia: Secondary | ICD-10-CM | POA: Diagnosis not present

## 2022-12-12 DIAGNOSIS — I4891 Unspecified atrial fibrillation: Secondary | ICD-10-CM | POA: Diagnosis not present

## 2022-12-12 DIAGNOSIS — C61 Malignant neoplasm of prostate: Secondary | ICD-10-CM | POA: Diagnosis not present

## 2022-12-12 DIAGNOSIS — E538 Deficiency of other specified B group vitamins: Secondary | ICD-10-CM | POA: Diagnosis not present

## 2022-12-12 DIAGNOSIS — G4700 Insomnia, unspecified: Secondary | ICD-10-CM | POA: Diagnosis not present

## 2022-12-12 DIAGNOSIS — D696 Thrombocytopenia, unspecified: Secondary | ICD-10-CM | POA: Diagnosis not present

## 2022-12-12 DIAGNOSIS — I10 Essential (primary) hypertension: Secondary | ICD-10-CM | POA: Diagnosis not present

## 2023-01-05 DIAGNOSIS — C61 Malignant neoplasm of prostate: Secondary | ICD-10-CM | POA: Diagnosis not present

## 2023-01-05 DIAGNOSIS — D4101 Neoplasm of uncertain behavior of right kidney: Secondary | ICD-10-CM | POA: Diagnosis not present

## 2023-01-29 ENCOUNTER — Encounter: Payer: Self-pay | Admitting: Cardiology

## 2023-01-29 MED ORDER — APIXABAN 5 MG PO TABS: 5.0000 mg | ORAL_TABLET | Freq: Two times a day (BID) | ORAL | 3 refills | Status: DC

## 2023-02-20 ENCOUNTER — Other Ambulatory Visit: Payer: Self-pay | Admitting: Neurology

## 2023-02-20 DIAGNOSIS — D4101 Neoplasm of uncertain behavior of right kidney: Secondary | ICD-10-CM | POA: Diagnosis not present

## 2023-02-20 DIAGNOSIS — C61 Malignant neoplasm of prostate: Secondary | ICD-10-CM | POA: Diagnosis not present

## 2023-02-20 DIAGNOSIS — R338 Other retention of urine: Secondary | ICD-10-CM | POA: Diagnosis not present

## 2023-02-20 DIAGNOSIS — G20A1 Parkinson's disease without dyskinesia, without mention of fluctuations: Secondary | ICD-10-CM

## 2023-02-20 DIAGNOSIS — D4410 Neoplasm of uncertain behavior of unspecified adrenal gland: Secondary | ICD-10-CM | POA: Diagnosis not present

## 2023-02-20 MED ORDER — CARBIDOPA-LEVODOPA 25-100 MG PO TABS
1.0000 | ORAL_TABLET | Freq: Three times a day (TID) | ORAL | 0 refills | Status: DC
Start: 2023-02-20 — End: 2023-03-27

## 2023-03-23 ENCOUNTER — Encounter: Payer: Self-pay | Admitting: Neurology
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
## 2023-03-27 ENCOUNTER — Encounter: Payer: Self-pay | Admitting: Neurology

## 2023-03-27 ENCOUNTER — Other Ambulatory Visit: Payer: Self-pay

## 2023-03-27 ENCOUNTER — Ambulatory Visit: Payer: Medicare PPO | Admitting: Neurology

## 2023-03-27 ENCOUNTER — Other Ambulatory Visit: Payer: Medicare PPO

## 2023-03-27 VITALS — BP 122/82 | HR 72 | Ht 71.0 in | Wt 213.6 lb

## 2023-03-27 DIAGNOSIS — D333 Benign neoplasm of cranial nerves: Secondary | ICD-10-CM | POA: Diagnosis not present

## 2023-03-27 DIAGNOSIS — H02402 Unspecified ptosis of left eyelid: Secondary | ICD-10-CM

## 2023-03-27 DIAGNOSIS — G20A1 Parkinson's disease without dyskinesia, without mention of fluctuations: Secondary | ICD-10-CM

## 2023-03-27 MED ORDER — CARBIDOPA-LEVODOPA 25-100 MG PO TABS
1.5000 | ORAL_TABLET | Freq: Three times a day (TID) | ORAL | 1 refills | Status: DC
Start: 2023-03-27 — End: 2023-10-24

## 2023-03-27 MED ORDER — CARBIDOPA-LEVODOPA ER 50-200 MG PO TBCR: 1.0000 | EXTENDED_RELEASE_TABLET | Freq: Every day | ORAL | 1 refills | Status: DC

## 2023-03-27 NOTE — Patient Instructions (Addendum)
 Increase carbidopa /levodopa  25/100, 1.5 tablets at 7am/11am/4pm ADD carbidopa /levodopa  50/200 at bed  Your provider has requested that you have labwork completed today. The lab is located on the Second floor at [address], within the [facility] office. When you get off the elevator, turn right and go in the [facility] [address]; the first brown door on the left.  Tell the ladies behind the desk that you are there for lab work. If you are not called within 15 minutes please check with the front desk.   Once you complete your labs you are free to go. You will receive a call or message via MyChart with your lab results.     A referral to [facility] has been placed for your MRI someone will contact you directly to schedule your appt. They are located at [address] [facility]. Please contact them directly by calling [phone] with any questions regarding your referral.

## 2023-03-30 ENCOUNTER — Encounter: Payer: Self-pay | Admitting: Neurology

## 2023-04-02 ENCOUNTER — Encounter: Payer: Self-pay | Admitting: Neurology

## 2023-04-02 LAB — MYASTHENIA GRAVIS PANEL 2
A CHR BINDING ABS: <0.3 nmol/L
ACHR Blocking Abs: <15 %{inhibition} (ref <15)
Acetylchol Modul Ab: <1 %{inhibition}

## 2023-04-10 ENCOUNTER — Ambulatory Visit
Admission: RE | Admit: 2023-04-10 | Discharge: 2023-04-10 | Disposition: A | Discharge status: Home / Self Care | Payer: Medicare PPO | Source: Ambulatory Visit | Attending: Neurology | Admitting: Neurology

## 2023-04-10 DIAGNOSIS — H9192 Unspecified hearing loss, left ear: Secondary | ICD-10-CM | POA: Diagnosis not present

## 2023-04-10 DIAGNOSIS — D333 Benign neoplasm of cranial nerves: Secondary | ICD-10-CM

## 2023-04-10 MED ORDER — GADOPICLENOL 0.5 MMOL/ML IV SOLN
10.0000 mL | Freq: Once | INTRAVENOUS | Status: AC | PRN
  Administered 2023-04-10: 10 mL via INTRAVENOUS

## 2023-04-11 ENCOUNTER — Telehealth: Payer: Self-pay | Admitting: Cardiology

## 2023-04-11 ENCOUNTER — Other Ambulatory Visit: Payer: Self-pay | Admitting: Urology

## 2023-04-11 NOTE — Telephone Encounter (Signed)
     Pre-operative Risk Assessment    Patient Name: Patrick Gray  DOB: 01/05/1956 MRN: [id number]   Date of last office visit: 10/17/22 Date of next office visit: 09/2023 (recall)   Request for Surgical Clearance    Procedure:   right retro retroperitoneal robotic radical nephrectomy   Date of Surgery:  Clearance 04/27/23                                Surgeon:  Dr. Benay Pike Surgeon's Group or Practice Name:  [facility] Phone number:  [phone] Fax number:  [phone]   Type of Clearance Requested:   - Medical  - Pharmacy:  Hold Apixaban (Eliquis) 2 days   Type of Anesthesia:  General    Additional requests/questions:    Signed, Noe Gens   04/11/2023, 12:09 PM

## 2023-04-11 NOTE — Telephone Encounter (Signed)
Patient with diagnosis of atrial fibrillation on Eliquis for anticoagulation.    Procedure:   right retro retroperitoneal robotic radical nephrectomy    Date of Surgery:  Clearance 04/27/23      CHA2DS2-VASc Score = 3   This indicates a 3.2% annual risk of stroke. The patient's score is based upon: CHF History: 0 HTN History: 1 Diabetes History: 0 Stroke History: 0 Vascular Disease History: 1 Age Score: 1 Gender Score: 0   CrCl > 100 Platelet count 130  Per office protocol, patient can hold Eliquis for 2 days prior to procedure.   Patient will not need bridging with Lovenox (enoxaparin) around procedure.  **This guidance is not considered finalized until pre-operative APP has relayed final recommendations.**

## 2023-04-11 NOTE — Telephone Encounter (Signed)
Please advise holding Eliquis prior to right retroperitoneal robotic radical nephrectomy on 04/27/2023.  Thank you!  DW

## 2023-04-11 NOTE — Telephone Encounter (Signed)
   Name: Patrick Gray  DOB: 1955/03/26  MRN: [id number]  Primary Cardiologist: None   Preoperative team, please contact this patient and set up a phone call appointment for further preoperative risk assessment. Please obtain consent and complete medication review. Thank you for your help.  I confirm that guidance regarding antiplatelet and oral anticoagulation therapy has been completed and, if necessary, noted below.  Per Pharm D, patient may hold Eliquis for 2 days prior to procedure.  Patient will not need bridging with Lovenox (enoxaparin) around procedure.  I also confirmed the patient resides in the state of [place]. As per [facility] Medical Board telemedicine laws, the patient must reside in the state in which the provider is licensed.   Carlos Levering, NP 04/11/2023, 4:46 PM [facility]

## 2023-04-12 ENCOUNTER — Telehealth: Payer: Self-pay | Admitting: *Deleted

## 2023-04-12 ENCOUNTER — Other Ambulatory Visit: Payer: Self-pay | Admitting: Urology

## 2023-04-12 NOTE — Telephone Encounter (Signed)
Pt has been scheduled tele preop appt 04/24/23. Med rec and consent are done.     Patient Consent for Virtual Visit        Patrick Gray has provided verbal consent on 04/12/2023 for a virtual visit (video or telephone).   CONSENT FOR VIRTUAL VISIT FOR:  Patrick Gray  By participating in this virtual visit I agree to the following:  I hereby voluntarily request, consent and authorize [facility] and its employed or contracted physicians, physician assistants, nurse practitioners or other licensed health care professionals (the Practitioner), to provide me with telemedicine health care services (the "Services") as deemed necessary by the treating Practitioner. I acknowledge and consent to receive the Services by the Practitioner via telemedicine. I understand that the telemedicine visit will involve communicating with the Practitioner through live audiovisual communication technology and the disclosure of certain medical information by electronic transmission. I acknowledge that I have been given the opportunity to request an in-person assessment or other available alternative prior to the telemedicine visit and am voluntarily participating in the telemedicine visit.  I understand that I have the right to withhold or withdraw my consent to the use of telemedicine in the course of my care at any time, without affecting my right to future care or treatment, and that the Practitioner or I may terminate the telemedicine visit at any time. I understand that I have the right to inspect all information obtained and/or recorded in the course of the telemedicine visit and may receive copies of available information for a reasonable fee.  I understand that some of the potential risks of receiving the Services via telemedicine include:  Delay or interruption in medical evaluation due to technological equipment failure or disruption; Information transmitted may not be sufficient (e.g. poor resolution of  images) to allow for appropriate medical decision making by the Practitioner; and/or  In rare instances, security protocols could fail, causing a breach of personal health information.  Furthermore, I acknowledge that it is my responsibility to provide information about my medical history, conditions and care that is complete and accurate to the best of my ability. I acknowledge that Practitioner's advice, recommendations, and/or decision may be based on factors not within their control, such as incomplete or inaccurate data provided by me or distortions of diagnostic images or specimens that may result from electronic transmissions. I understand that the practice of medicine is not an exact science and that Practitioner makes no warranties or guarantees regarding treatment outcomes. I acknowledge that a copy of this consent can be made available to me via my patient portal [facility] MyChart), or I can request a printed copy by calling the office of [facility].    I understand that my insurance will be billed for this visit.   I have read or had this consent read to me. I understand the contents of this consent, which adequately explains the benefits and risks of the Services being provided via telemedicine.  I have been provided ample opportunity to ask questions regarding this consent and the Services and have had my questions answered to my satisfaction. I give my informed consent for the services to be provided through the use of telemedicine in my medical care

## 2023-04-13 ENCOUNTER — Encounter: Payer: Self-pay | Admitting: Neurology

## 2023-04-16 ENCOUNTER — Ambulatory Visit: Payer: Medicare PPO | Admitting: Neurology

## 2023-04-17 DIAGNOSIS — D4410 Neoplasm of uncertain behavior of unspecified adrenal gland: Secondary | ICD-10-CM | POA: Diagnosis not present

## 2023-04-17 DIAGNOSIS — N390 Urinary tract infection, site not specified: Secondary | ICD-10-CM | POA: Diagnosis not present

## 2023-04-17 DIAGNOSIS — D4101 Neoplasm of uncertain behavior of right kidney: Secondary | ICD-10-CM | POA: Diagnosis not present

## 2023-04-19 NOTE — Patient Instructions (Signed)
 SURGICAL WAITING ROOM VISITATION Patients having surgery or a procedure may have no more than 2 support people in the waiting area - these visitors may rotate in the visitor waiting room.   Due to an increase in RSV and influenza rates and associated hospitalizations, children ages 5 and under may not visit patients in [facility] hospitals. If the patient needs to stay at the hospital during part of their recovery, the visitor guidelines for inpatient rooms apply.  PRE-OP VISITATION  Pre-op nurse will coordinate an appropriate time for 1 support person to accompany the patient in pre-op.  This support person may not rotate.  This visitor will be contacted when the time is appropriate for the visitor to come back in the pre-op area.  Please refer to the [facility] website for the visitor guidelines for Inpatients (after your surgery is over and you are in a regular room).  You are not required to quarantine at this time prior to your surgery. However, you must do this: Hand Hygiene often Do NOT share personal items Notify your provider if you are in close contact with someone who has COVID or you develop fever 100.4 or greater, new onset of sneezing, cough, sore throat, shortness of breath or body aches.  If you test positive for Covid or have been in contact with anyone that has tested positive in the last 10 days please notify you surgeon.    Your procedure is scheduled on:  Friday April 27, 2023  Report to [facility] Main Entrance: [address]sen entrance where the [employer] is available.   Report to admitting at: 08:45    AM  Call this number if you have any questions or problems the morning of surgery [phone]  DO NOT EAT OR DRINK ANYTHING AFTER MIDNIGHT THE NIGHT PRIOR TO YOUR SURGERY / PROCEDURE.   FOLLOW  ANY ADDITIONAL PRE OP INSTRUCTIONS YOU RECEIVED FROM YOUR SURGEON'S OFFICE!!!  MAGNESIUM CITRATE:  Obtain one (1)  bottle (10 oz) of Magnesium Citrate at your  pharmacy. Drink entire bottle at 12:00 noon the day before your surgery/ procedure.   If you have any questions, please contact your Surgeon's office for additional information.     Oral Hygiene is also important to reduce your risk of infection.        Remember - BRUSH YOUR TEETH THE MORNING OF SURGERY WITH YOUR REGULAR TOOTHPASTE  Do NOT smoke after Midnight the night before surgery.  ELIQUIS- Stop taking this medication 2 days before surgery. Last dose will be taken on Tuesday 04-24-23.   STOP TAKING all Vitamins, Herbs and supplements 1 week before your surgery.   Take ONLY these medicines the morning of surgery with A SIP OF WATER: Amlodipine, metoprolol, carbidopa-levodopa (Sinemet).                     You may not have any metal on your body including jewelry, and body piercing  Do not wear lotions, powders, cologne, or deodorant  Men may shave face and neck.  Contacts, Hearing Aids, dentures or bridgework may not be worn into surgery. DENTURES WILL BE REMOVED PRIOR TO SURGERY PLEASE DO NOT APPLY "Poly grip" OR ADHESIVES!!!  You may bring a small overnight bag with you on the day of surgery, only pack items that are not valuable. [facility] IS NOT RESPONSIBLE   FOR VALUABLES THAT ARE LOST OR STOLEN.   Do not bring your home medications to the hospital. The Pharmacy will  dispense medications listed on your medication list to you during your admission in the Hospital.  Special Instructions: Bring a copy of your healthcare power of attorney and living will documents the day of surgery, if you wish to have them scanned into your [facility] Records- EPIC  Please read over the following fact sheets you were given: IF YOU HAVE QUESTIONS ABOUT YOUR PRE-OP INSTRUCTIONS, PLEASE CALL [phone]    Rudean Haskell, BSN, CVRN-BC   [facility] - Preparing for Surgery Before surgery, you can play an important role.  Because skin is not sterile, your skin needs to be as free of germs  as possible.  You can reduce the number of germs on your skin by washing with CHG (chlorahexidine gluconate) soap before surgery.  CHG is an antiseptic cleaner which kills germs and bonds with the skin to continue killing germs even after washing. Please DO NOT use if you have an allergy to CHG or antibacterial soaps.  If your skin becomes reddened/irritated stop using the CHG and inform your nurse when you arrive at Short Stay. Do not shave (including legs and underarms) for at least 48 hours prior to the first CHG shower.  You may shave your face/neck.  Please follow these instructions carefully:  1.  Shower with CHG Soap the night before surgery and the  morning of surgery.  2.  If you choose to wash your hair, wash your hair first as usual with your normal  shampoo.  3.  After you shampoo, rinse your hair and body thoroughly to remove the shampoo.                             4.  Use CHG as you would any other liquid soap.  You can apply chg directly to the skin and wash.  Gently with a scrungie or clean washcloth.  5.  Apply the CHG Soap to your body ONLY FROM THE NECK DOWN.   Do not use on face/ open                           Wound or open sores. Avoid contact with eyes, ears mouth and genitals (private parts).                       Wash face,  Genitals (private parts) with your normal soap.             6.  Wash thoroughly, paying special attention to the area where your  surgery  will be performed.  7.  Thoroughly rinse your body with warm water from the neck down.  8.  DO NOT shower/wash with your normal soap after using and rinsing off the CHG Soap.            9.  Pat yourself dry with a clean towel.            10.  Wear clean pajamas.            11.  Place clean sheets on your bed the night of your first shower and do not  sleep with pets.  ON THE DAY OF SURGERY : Do not apply any lotions/deodorants the morning of surgery.  Please wear clean clothes to the hospital/surgery  center.    FAILURE TO FOLLOW THESE INSTRUCTIONS MAY RESULT IN THE CANCELLATION OF YOUR SURGERY  PATIENT SIGNATURE_________________________________  NURSE  SIGNATURE__________________________________  ________________________________________________________________________

## 2023-04-19 NOTE — Progress Notes (Signed)
 COVID Vaccine received:  [x]  No []  Yes Date of any COVID positive Test in last 90 days:  none  PCP -  [name], MD  at [facility] Cardiologist - Hillis Range, MD), Donato Schultz, MD  Neurology- Kerin Salen, DO    Chest x-ray -  EKG - 08-02-2022  Epic  Stress Test -  ECHO - 05-02-2021  Epic Cardiac Cath - 10-07-2015  Epic CT coronary Calcium Score: 622 on 07-04-2022  PCR screen: []  Ordered & Completed []   No Order but Needs PROFEND     [x]   N/A for this surgery  Surgery Plan:  []  Ambulatory   []  Outpatient in bed  [x]  Admit Anesthesia:    [x]  General  []  Spinal  []   Choice []   MAC  Bowel Prep - []  No  [x]   Yes __Mag Citrate  drink by noon the day before surgery___  Pacemaker / ICD device [x]  No []  Yes   Spinal Cord Stimulator:[x]  No []  Yes       History of Sleep Apnea? [x]  No []  Yes   CPAP used?- [x]  No []  Yes    Does the patient monitor blood sugar?   [x]  N/A   []  No []  Yes  Patient has: [x]  NO Hx DM   []  Pre-DM   []  DM1  []   DM2  Blood Thinner / Instructions:  Eliquis  hold x 2 days  No bridge needed.  Last dose: 04-24-2023 Aspirin Instructions:  none  ERAS Protocol Ordered: [x]  No  []  Yes Patient is to be NPO after: midnight prior  Dental hx: []  Dentures:  [x]  N/A      []  Bridge or Partial:                   []  Loose or Damaged teeth:   Activity level: Patient is unable to climb a flight of stairs without difficulty; [x]  No CP  [x]  No SOB, but would have problems d/t Parkinson's.  Patient can perform ADLs without assistance.   Anesthesia review: HTN, CAD, A.fib (Cardioversions x 5 and ablations x 2), Parkinson's- tremors left hand, S/p ACDF C5-6, C6-7 in 2022, anxiety, Deaf Left ear d/t vestibular schwannoma & acoustic neuroma,   Hx of "post anesthesia delusions, hallucinations, violent toward staff"  When he gets his medication at appropriate intervals, he does not have any aggression. Per his wife, patient has a lot of problems with RLS in LLE,  Dystonia also.    Patient denies shortness of breath, fever, cough and chest pain at PAT appointment.  Patient verbalized understanding and agreement to the Pre-Surgical Instructions that were given to them at this PAT appointment. Patient was also educated of the need to review these PAT instructions again prior to his surgery.I reviewed the appropriate phone numbers to call if they have any and questions or concerns.

## 2023-04-20 ENCOUNTER — Encounter (HOSPITAL_COMMUNITY)
Admission: RE | Admit: 2023-04-20 | Discharge: 2023-04-20 | Disposition: A | Discharge status: Home / Self Care | Payer: Medicare PPO | Source: Ambulatory Visit | Attending: Urology | Admitting: Urology

## 2023-04-20 ENCOUNTER — Encounter: Payer: Self-pay | Admitting: Neurology

## 2023-04-20 ENCOUNTER — Other Ambulatory Visit: Payer: Self-pay

## 2023-04-20 ENCOUNTER — Encounter (HOSPITAL_COMMUNITY): Payer: Self-pay

## 2023-04-20 VITALS — BP 123/88 | HR 67 | Temp 98.1°F | Resp 16 | Ht 71.0 in | Wt 202.0 lb

## 2023-04-20 DIAGNOSIS — I480 Paroxysmal atrial fibrillation: Secondary | ICD-10-CM | POA: Diagnosis not present

## 2023-04-20 DIAGNOSIS — Z01818 Encounter for other preprocedural examination: Secondary | ICD-10-CM | POA: Diagnosis present

## 2023-04-20 DIAGNOSIS — Z9889 Other specified postprocedural states: Secondary | ICD-10-CM | POA: Diagnosis not present

## 2023-04-20 DIAGNOSIS — I10 Essential (primary) hypertension: Secondary | ICD-10-CM | POA: Diagnosis not present

## 2023-04-20 DIAGNOSIS — I712 Thoracic aortic aneurysm, without rupture, unspecified: Secondary | ICD-10-CM | POA: Diagnosis not present

## 2023-04-20 DIAGNOSIS — E876 Hypokalemia: Secondary | ICD-10-CM | POA: Diagnosis not present

## 2023-04-20 DIAGNOSIS — Z7901 Long term (current) use of anticoagulants: Secondary | ICD-10-CM

## 2023-04-20 DIAGNOSIS — Z981 Arthrodesis status: Secondary | ICD-10-CM | POA: Insufficient documentation

## 2023-04-20 DIAGNOSIS — D333 Benign neoplasm of cranial nerves: Secondary | ICD-10-CM | POA: Diagnosis not present

## 2023-04-20 DIAGNOSIS — N2889 Other specified disorders of kidney and ureter: Secondary | ICD-10-CM | POA: Diagnosis not present

## 2023-04-20 DIAGNOSIS — Z01812 Encounter for preprocedural laboratory examination: Secondary | ICD-10-CM | POA: Diagnosis present

## 2023-04-20 DIAGNOSIS — Z87891 Personal history of nicotine dependence: Secondary | ICD-10-CM | POA: Diagnosis not present

## 2023-04-20 DIAGNOSIS — F1011 Alcohol abuse, in remission: Secondary | ICD-10-CM | POA: Insufficient documentation

## 2023-04-20 DIAGNOSIS — I4891 Unspecified atrial fibrillation: Secondary | ICD-10-CM

## 2023-04-20 DIAGNOSIS — G20A1 Parkinson's disease without dyskinesia, without mention of fluctuations: Secondary | ICD-10-CM | POA: Insufficient documentation

## 2023-04-20 DIAGNOSIS — I2510 Atherosclerotic heart disease of native coronary artery without angina pectoris: Secondary | ICD-10-CM | POA: Diagnosis not present

## 2023-04-20 HISTORY — DX: Atherosclerotic heart disease of native coronary artery without angina pectoris: I25.10

## 2023-04-20 LAB — COMPREHENSIVE METABOLIC PANEL
ALT: 9 U/L (ref 0–44)
AST: 39 U/L (ref 15–41)
Albumin: 4.1 g/dL (ref 3.5–5.0)
Alkaline Phosphatase: 55 U/L (ref 38–126)
Anion gap: 11 (ref 5–15)
BUN: 16 mg/dL (ref 8–23)
CO2: 24 mmol/L (ref 22–32)
Calcium: 9.8 mg/dL (ref 8.9–10.3)
Chloride: 104 mmol/L (ref 98–111)
Creatinine, Ser: 0.66 mg/dL (ref 0.61–1.24)
GFR, Estimated: >60 mL/min (ref >60)
Glucose, Bld: 114 mg/dL — ABNORMAL HIGH (ref 70–99)
Potassium: 3.1 mmol/L — ABNORMAL LOW (ref 3.5–5.1)
Sodium: 139 mmol/L (ref 135–145)
Total Bilirubin: 1.7 mg/dL — ABNORMAL HIGH (ref 0.0–1.2)
Total Protein: 6.7 g/dL (ref 6.5–8.1)

## 2023-04-20 LAB — CBC
HCT: 41.5 % (ref 39.0–52.0)
Hemoglobin: 14.8 g/dL (ref 13.0–17.0)
MCH: 32 pg (ref 26.0–34.0)
MCHC: 35.7 g/dL (ref 30.0–36.0)
MCV: 89.8 fL (ref 80.0–100.0)
Platelets: 127 10*3/uL — ABNORMAL LOW (ref 150–400)
RBC: 4.62 MIL/uL (ref 4.22–5.81)
RDW: 13.5 % (ref 11.5–15.5)
WBC: 4.1 10*3/uL (ref 4.0–10.5)
nRBC: 0 % (ref 0.0–0.2)

## 2023-04-20 LAB — TYPE AND SCREEN
ABO/RH(D): O NEG
Antibody Screen: NEGATIVE

## 2023-04-24 ENCOUNTER — Ambulatory Visit: Payer: Medicare PPO | Attending: Cardiovascular Disease | Admitting: Nurse Practitioner

## 2023-04-24 ENCOUNTER — Encounter (HOSPITAL_COMMUNITY): Payer: Self-pay

## 2023-04-24 DIAGNOSIS — Z01810 Encounter for preprocedural cardiovascular examination: Secondary | ICD-10-CM | POA: Diagnosis not present

## 2023-04-24 NOTE — Progress Notes (Signed)
 Virtual Visit via Telephone Note   Because of Patrick Gray co-morbid illnesses, he is at least at moderate risk for complications without adequate follow up.  This format is felt to be most appropriate for this patient at this time.  Due to technical limitations with video connection Web designer), today's appointment will be conducted as an audio only telehealth visit, and Patrick Gray verbally agreed to proceed in this manner.   All issues noted in this document were discussed and addressed.  No physical exam could be performed with this format.  Evaluation Performed:  Preoperative cardiovascular risk assessment _____________   Date:  04/24/2023   Patient ID:  Patrick Gray, DOB June 11, 1955, MRN [id number] Patient Location:  Home Provider location:   Office  Primary Care Provider:  [name], MD Primary Cardiologist:  Donato Schultz, MD  Chief Complaint / Patient Profile   68 y.o. y/o male with a h/o coronary artery calcification, persistent atrial fibrillation, mildly dilated ascending aorta, hypertension, hyperlipidemia, and Parkinson's disease who is pending right retro retroperitoneal robotic radical nephrectomy with Dr. Tawanna Sat of [facility] and presents today for telephonic preoperative cardiovascular risk assessment.  History of Present Illness    Patrick Gray is a 68 y.o. male who presents via audio/video conferencing for a telehealth visit today.  Pt was last seen in cardiology clinic on 10/17/2022 by Dr. Anne Fu.  At that time Patrick Gray was doing well.  The patient is now pending procedure as outlined above. Since his last visit, he has done well from a cardiac standpoint.   He denies chest pain, palpitations, dyspnea, pnd, orthopnea, n, v, dizziness, syncope, edema, weight gain, or early satiety. All other systems reviewed and are otherwise negative except as noted above.   Past Medical History    Past Medical History:  Diagnosis Date   Acoustic  neuroma (HCC)    left   Anxiety    Arthritis    Complication of anesthesia    Delusion, violent, hallucination, hospitalized 8 days, urine retention   Coronary artery disease    Deafness in left ear    Delusion (HCC)    after fall on pain meds and prednisone   Dysrhythmia    PAF   Foley catheter in place    H/O cardiac radiofrequency ablation    HTN (hypertension)    Hypercholesterolemia    Hypopotassemia    Insomnia    Paroxysmal atrial fibrillation (HCC)    Radiculopathy    after fall   Tremor    left hand   Vitamin B 12 deficiency    Past Surgical History:  Procedure Laterality Date   ACOUSTIC NEUROMA RESECTION Left 19990   ANTERIOR CERVICAL DECOMP/DISCECTOMY FUSION N/A 04/26/2020   Procedure: Anterior Cervical Decompression Fusion - Cervical five-Cervical six - Cervical six-Cervical seven;  Surgeon: Donalee Citrin, MD;  Location: [facility] OR;  Service: Neurosurgery;  Laterality: N/A;   APPENDECTOMY  1976   ATRIAL FIBRILLATION ABLATION N/A 12/19/2016   Procedure: ATRIAL FIBRILLATION ABLATION;  Surgeon: Hillis Range, MD;  Location: [facility] INVASIVE CV LAB;  Service: Cardiovascular;  Laterality: N/A;   CARDIAC CATHETERIZATION N/A 10/07/2015   Procedure: Left Heart Cath and Coronary Angiography;  Surgeon: Wendall Stade, MD;  Location: [facility] INVASIVE CV LAB;  Service: Cardiovascular;  Laterality: N/A;   CARDIOVERSION N/A 06/17/2013   Procedure: CARDIOVERSION;  Surgeon: Wendall Stade, MD;  Location: [facility] ENDOSCOPY;  Service: Cardiovascular;  Laterality: N/A;   CARDIOVERSION N/A 05/11/2016  Procedure: CARDIOVERSION;  Surgeon: Thurmon Fair, MD;  Location: [facility] ENDOSCOPY;  Service: Cardiovascular;  Laterality: N/A;   CARDIOVERSION N/A 05/24/2017   Procedure: CARDIOVERSION;  Surgeon: Quintella Reichert, MD;  Location: [facility] ENDOSCOPY;  Service: Cardiovascular;  Laterality: N/A;   CARDIOVERSION N/A 11/19/2017   Procedure: CARDIOVERSION;  Surgeon: Chrystie Nose, MD;  Location: [facility] ENDOSCOPY;  Service:  Cardiovascular;  Laterality: N/A;   CHOLECYSTECTOMY  1976   CLUB FOOT RELEASE  1968   ELECTROPHYSIOLOGIC STUDY N/A 11/30/2015   Procedure: Atrial Fibrillation Ablation;  Surgeon: Hillis Range, MD;  Location: [facility] INVASIVE CV LAB;  Service: Cardiovascular;  Laterality: N/A;   TEE WITHOUT CARDIOVERSION N/A 05/11/2016   Procedure: TRANSESOPHAGEAL ECHOCARDIOGRAM (TEE);  Surgeon: Thurmon Fair, MD;  Location: [facility] ENDOSCOPY;  Service: Cardiovascular;  Laterality: N/A;   TONSILLECTOMY  1967   TOTAL SHOULDER ARTHROPLASTY Left 06/15/2016   Procedure: TOTAL SHOULDER ARTHROPLASTY;  Surgeon: Jones Broom, MD;  Location: [facility] OR;  Service: Orthopedics;  Laterality: Left;  Left total shoulder arthroplasty   TRANSURETHRAL RESECTION OF PROSTATE N/A 07/02/2020   Procedure: TRANSURETHRAL RESECTION OF THE PROSTATE (TURP);  Surgeon: Crista Elliot, MD;  Location: [facility] ORS;  Service: Urology;  Laterality: N/A;    Allergies  Allergies  Allergen Reactions   Prednisolone Other (See Comments)    Insomnia    Penicillins Rash and Other (See Comments)         Home Medications    Prior to Admission medications   Medication Sig Start Date End Date Taking? Authorizing Provider  AMBULATORY NON FORMULARY MEDICATION Take 2 capsules by mouth daily. Medication Name: Neuralli probiotic    [provider]  amLODipine (NORVASC) 10 MG tablet Take 10 mg by mouth daily.    [provider]  apixaban (ELIQUIS) 5 MG TABS tablet Take 1 tablet (5 mg total) by mouth 2 (two) times daily. 01/29/23   Jake Bathe, MD  Ascorbic Acid (VITAMIN C CR) 1500 MG TBCR Take 1,500 mg by mouth daily.    [provider]  atorvastatin (LIPITOR) 20 MG tablet Take 20 mg by mouth daily. 10/30/13   [provider]  carbidopa-levodopa (SINEMET CR) 50-200 MG tablet Take 1 tablet by mouth at bedtime. 03/27/23   Tat, Octaviano Batty, DO  carbidopa-levodopa (SINEMET IR) 25-100 MG tablet Take 1.5 tablets by mouth 3 (three) times  daily. 7am/11am/4pm Patient taking differently: Take 1.5 tablets by mouth 3 (three) times daily. 7am/11am/4pm, May take a 0.5 mg tablet twice daily as needed for restlessness 03/27/23   Tat, Rebecca S, DO  Cholecalciferol (VITAMIN D3) 250 MCG (10000 UT) capsule Take 10,000 Units by mouth daily.    [provider]  Coenzyme Q10 (COQ10) 100 MG CAPS Take 100 mg by mouth daily.    [provider]  cyanocobalamin (VITAMIN B12) 1000 MCG tablet Take 1,000 mcg by mouth daily.    [provider]  metoprolol tartrate (LOPRESSOR) 50 MG tablet TAKE 1 TABLET BY MOUTH TWICE DAILY. MAY TAKE AN EXTRA 1/2 TABLET AS NEEDED FOR BREAKTHROUGH AFIB 05/31/22   Fenton, Clint R, PA  olmesartan-hydrochlorothiazide (BENICAR HCT) 40-25 MG tablet Take 1 tablet by mouth daily.    [provider]  potassium chloride SA (KLOR-CON M) 20 MEQ tablet Take 2 tablet (40 meq) by mouth in the morning & take 1 tablet (20 meq) by mouth in the evening. Patient taking differently: Take 60 mEq by mouth daily. 11/01/21   Newman Nip, NP  sildenafil (REVATIO) 20  MG tablet Take 20-100 mg by mouth daily as needed for erectile dysfunction. 08/09/17   [provider]  traZODone (DESYREL) 100 MG tablet Take 100 mg by mouth at bedtime.    [provider]    Physical Exam    Vital Signs:  Patrick Gray does not have vital signs available for review today.  Given telephonic nature of communication, physical exam is limited. AAOx3. NAD. Normal affect.  Speech and respirations are unlabored.  Accessory Clinical Findings    None  Assessment & Plan    1.  Preoperative Cardiovascular Risk Assessment:  According to the Revised Cardiac Risk Index (RCRI), his Perioperative Risk of Major Cardiac Event is (%): 0.4. His Functional Capacity in METs is: 7.01 according to the Duke Activity Status Index (DASI). Therefore, based on ACC/AHA guidelines, patient would be at acceptable risk for the planned  procedure without further cardiovascular testing.  The patient was advised that if he develops new symptoms prior to surgery to contact our office to arrange for a follow-up visit, and he verbalized understanding.  Per office protocol, patient can hold Eliquis for 2 days prior to procedure. Patient will not need bridging with Lovenox (enoxaparin) around procedure. Please resume Eliquis as soon as possible postprocedure, at the discretion of the surgeon.     A copy of this note will be routed to requesting surgeon.  Time:   Today, I have spent 5 minutes with the patient with telehealth technology discussing medical history, symptoms, and management plan.     Joylene Grapes, NP  04/24/2023, 10:06 AM

## 2023-04-24 NOTE — Progress Notes (Signed)
 Case: [id number] Date/Time: 04/27/23 1045   Procedure: XI RIGHT RETROPERITONEAL ROBOTIC ASSISTED LAPAROSCOPIC RADICAL NEPHRECTOMY (Right) - 180 MINUTES NEEDED FOR CASE   Anesthesia type: General   Pre-op diagnosis: RIGHT RENAL MASS   [facility] ROOM 05 / [facility]   Surgeons: Loletta Parish., MD       DISCUSSION:  Patrick Gray is a 68 yo male who presents to PAT prior to surgery above. PMH of former smoking, HTN, PAF s/p ablation (2018), CAD, TAA, Parkinson's disease, acoustic neuroma s/p resection (1990s, with recurrence), right renal mass, BPH s/p TURP (2022), s/p ACDF (2022), hx of ETOH abuse.  Prior complication from anesthesia includes delusions and agitation post operatively after C-spine surgery in 2022. Patient was admitted and disorientation lasted ~4 days after surgery. Neurology was consulted who felt symptoms were consistent with delirium. Ultimately symptoms resolved. He had a TURP after the C-spine surgery without issues.  Patient follows with Neurology due to hx of acoustic neuroma and Parkinson's disease. Last seen on 03/27/23. He is on Sinemet and dose was increased at last visit. The acoustic neuroma was resected in the 1990s but has recurred. It was noted he has L eye ptosis and repeat MRI brain was ordered to ensure no change which was done on 04/10/23 and was stable. Myasthenia gravis panel was also ordered and was negative.  Patient follows with Cardiology for PAF s/p ablation in 2017 and 2018 and multiple cardioversions. He is still in permanent A.fib and takes Eliquis. Also has CAD by CT and mildly dilated ascending aorta. Had cardiac clearance visit on 3/4. Cleared for surgery:  "Preoperative Cardiovascular Risk Assessment:   According to the Revised Cardiac Risk Index (RCRI), his Perioperative Risk of Major Cardiac Event is (%): 0.4. His Functional Capacity in METs is: 7.01 according to the Duke Activity Status Index (DASI). Therefore, based on ACC/AHA guidelines,  patient would be at acceptable risk for the planned procedure without further cardiovascular testing.  Per office protocol, patient can hold Eliquis for 2 days prior to procedure. Patient will not need bridging with Lovenox (enoxaparin) around procedure. Please resume Eliquis as soon as possible postprocedure, at the discretion of the surgeon."  VS: BP 123/88 Comment: right arm sitting  Pulse 67   Temp 36.7 C (Oral)   Resp 16   Ht 5\' 11"  (1.803 m)   Wt 91.6 kg   SpO2 97%   BMI 28.17 kg/m   PROVIDERS: [name], MD Neurology: Kerin Salen, DO Cardiologist - Hillis Range, MD), Donato Schultz, MD   LABS: Labs reviewed: Acceptable for surgery. (all labs ordered are listed, but only abnormal results are displayed)  Labs Reviewed  COMPREHENSIVE METABOLIC PANEL - Abnormal; Notable for the following components:      Result Value   Potassium 3.1 (*)    Glucose, Bld 114 (*)    Total Bilirubin 1.7 (*)    All other components within normal limits  CBC - Abnormal; Notable for the following components:   Platelets 127 (*)    All other components within normal limits  TYPE AND SCREEN     IMAGES:  MRI Brain 04/10/23:    IMPRESSION: *No significant change in the left 7-8 nerve intracanalicular mass bulging into the CP angle cistern measuring 22 x 12 x 11 mm consistent with the history of acoustic neuroma (vestibular schwannoma). *No other enhancing lesions are noted.  CT Chest/Abd/Pelvis 10/05/22:  IMPRESSION: 1. No acute intrathoracic, intra-abdominal, intrapelvic traumatic injury. 2. No acute fracture or  traumatic malalignment of the thoracic or lumbar spine.   Other imaging findings of potential clinical significance:   1. Indeterminate heterogeneous 5 x 5 x 6.5 cm right renal lesion with associated macroscopic fat. Finding could represent a large angiomyolipoma with underlying malignancy not excluded given size. When the patient is clinically stable and able to follow  directions and hold their breath (preferably as an outpatient) further evaluation with dedicated outpatient MRI renal protocol should be considered. 2.  Aortic Atherosclerosis (ICD10-I70.0).  EKG 08/02/22  A. Fib rate 87   CV:  CT Calcium score 07/04/22:  IMPRESSION: 1. Coronary calcium score of 622. This was 84th percentile for age, gender, and race matched controls.   2. Evidence of mild ascending aortic dilation, 40 mm, on non-contrasted study. Consider secondary imaging modality (echocardiogram, CTA Aorta Protocol, MRA Aorta Protocol) if clinically indicated.   3. Aortic atherosclerosis.   Echo 05/02/2021:  IMPRESSIONS     1. Left ventricular ejection fraction, by estimation, is 55 to 60%. The  left ventricle has normal function. The left ventricle has no regional  wall motion abnormalities. There is mild left ventricular hypertrophy.  Left ventricular diastolic parameters  are indeterminate.   2. Right ventricular systolic function is normal. The right ventricular  size is normal.   3. The mitral valve is normal in structure. Trivial mitral valve  regurgitation.   4. The aortic valve was not well visualized. Aortic valve regurgitation  is trivial. No aortic stenosis is present.   5. Aortic dilatation noted. There is dilatation of the ascending aorta,  measuring 40 mm.   6. The inferior vena cava is normal in size with greater than 50%  respiratory variability, suggesting right atrial pressure of 3 mmHg.    Stress Test 09/17/2015   The left ventricular ejection fraction is normal (55-65%). Nuclear stress EF: 58%. No wall motion abnormalities There was no ST segment deviation noted during stress. Defect 1: There is a small defect of moderate severity present in the mid inferior location. Findings consistent with ischemia. This is a low risk study.   Cardiac Cath 10/07/2015   Prox RCA lesion, 30 %stenosed. RPDA lesion, 30 %stenosed.   Normal right dominant  coronary arteries with false positive myovue EF 65% Only 30% proximal/mid and PLA lesions seen  Past Medical History:  Diagnosis Date   Acoustic neuroma (HCC)    left   Anxiety    Arthritis    Complication of anesthesia    Delusion, violent, hallucination, hospitalized 8 days, urine retention   Coronary artery disease    Deafness in left ear    Delusion (HCC)    after fall on pain meds and prednisone   Dysrhythmia    PAF   Foley catheter in place    H/O cardiac radiofrequency ablation    HTN (hypertension)    Hypercholesterolemia    Hypopotassemia    Insomnia    Paroxysmal atrial fibrillation (HCC)    Radiculopathy    after fall   Tremor    left hand   Vitamin B 12 deficiency     Past Surgical History:  Procedure Laterality Date   ACOUSTIC NEUROMA RESECTION Left 19990   ANTERIOR CERVICAL DECOMP/DISCECTOMY FUSION N/A 04/26/2020   Procedure: Anterior Cervical Decompression Fusion - Cervical five-Cervical six - Cervical six-Cervical seven;  Surgeon: Donalee Citrin, MD;  Location: [facility] OR;  Service: Neurosurgery;  Laterality: N/A;   APPENDECTOMY  1976   ATRIAL FIBRILLATION ABLATION N/A 12/19/2016   Procedure: ATRIAL  FIBRILLATION ABLATION;  Surgeon: Hillis Range, MD;  Location: [facility] INVASIVE CV LAB;  Service: Cardiovascular;  Laterality: N/A;   CARDIAC CATHETERIZATION N/A 10/07/2015   Procedure: Left Heart Cath and Coronary Angiography;  Surgeon: Wendall Stade, MD;  Location: [facility] INVASIVE CV LAB;  Service: Cardiovascular;  Laterality: N/A;   CARDIOVERSION N/A 06/17/2013   Procedure: CARDIOVERSION;  Surgeon: Wendall Stade, MD;  Location: [facility] ENDOSCOPY;  Service: Cardiovascular;  Laterality: N/A;   CARDIOVERSION N/A 05/11/2016   Procedure: CARDIOVERSION;  Surgeon: Thurmon Fair, MD;  Location: [facility] ENDOSCOPY;  Service: Cardiovascular;  Laterality: N/A;   CARDIOVERSION N/A 05/24/2017   Procedure: CARDIOVERSION;  Surgeon: Quintella Reichert, MD;  Location: [facility] ENDOSCOPY;  Service: Cardiovascular;   Laterality: N/A;   CARDIOVERSION N/A 11/19/2017   Procedure: CARDIOVERSION;  Surgeon: Chrystie Nose, MD;  Location: [facility] ENDOSCOPY;  Service: Cardiovascular;  Laterality: N/A;   CHOLECYSTECTOMY  1976   CLUB FOOT RELEASE  1968   ELECTROPHYSIOLOGIC STUDY N/A 11/30/2015   Procedure: Atrial Fibrillation Ablation;  Surgeon: Hillis Range, MD;  Location: [facility] INVASIVE CV LAB;  Service: Cardiovascular;  Laterality: N/A;   TEE WITHOUT CARDIOVERSION N/A 05/11/2016   Procedure: TRANSESOPHAGEAL ECHOCARDIOGRAM (TEE);  Surgeon: Thurmon Fair, MD;  Location: [facility] ENDOSCOPY;  Service: Cardiovascular;  Laterality: N/A;   TONSILLECTOMY  1967   TOTAL SHOULDER ARTHROPLASTY Left 06/15/2016   Procedure: TOTAL SHOULDER ARTHROPLASTY;  Surgeon: Jones Broom, MD;  Location: [facility] OR;  Service: Orthopedics;  Laterality: Left;  Left total shoulder arthroplasty   TRANSURETHRAL RESECTION OF PROSTATE N/A 07/02/2020   Procedure: TRANSURETHRAL RESECTION OF THE PROSTATE (TURP);  Surgeon: Crista Elliot, MD;  Location: [facility] ORS;  Service: Urology;  Laterality: N/A;    MEDICATIONS:  AMBULATORY NON FORMULARY MEDICATION   amLODipine (NORVASC) 10 MG tablet   apixaban (ELIQUIS) 5 MG TABS tablet   Ascorbic Acid (VITAMIN C CR) 1500 MG TBCR   atorvastatin (LIPITOR) 20 MG tablet   carbidopa-levodopa (SINEMET CR) 50-200 MG tablet   carbidopa-levodopa (SINEMET IR) 25-100 MG tablet   Cholecalciferol (VITAMIN D3) 250 MCG (10000 UT) capsule   Coenzyme Q10 (COQ10) 100 MG CAPS   cyanocobalamin (VITAMIN B12) 1000 MCG tablet   metoprolol tartrate (LOPRESSOR) 50 MG tablet   olmesartan-hydrochlorothiazide (BENICAR HCT) 40-25 MG tablet   potassium chloride SA (KLOR-CON M) 20 MEQ tablet   sildenafil (REVATIO) 20 MG tablet   traZODone (DESYREL) 100 MG tablet   No current facility-administered medications for this encounter.   Marcille Blanco [facility] Short Stay/Anesthesiology [facility] Phone [phone] 04/24/2023 1:59  PM

## 2023-04-24 NOTE — Anesthesia Preprocedure Evaluation (Addendum)
 Anesthesia Evaluation    Reviewed: Allergy & Precautions, Patient's Chart, lab work & pertinent test results  History of Anesthesia Complications (+) Emergence Delirium and history of anesthetic complications  Airway Mallampati: III  TM Distance: >3 FB Neck ROM: Full    Dental  (+) Dental Advisory Given   Pulmonary former smoker   Pulmonary exam normal        Cardiovascular hypertension, Pt. on medications and Pt. on home beta blockers + CAD  Normal cardiovascular exam+ dysrhythmias Atrial Fibrillation      Neuro/Psych  PSYCHIATRIC DISORDERS Anxiety      Acoustic neuroma s/p resection   Neuromuscular disease (Parkinson's disease)    GI/Hepatic negative GI ROS,,,(+)     substance abuse  alcohol use  Endo/Other   Hypokalemia   Renal/GU  Right renal mass      Musculoskeletal  (+) Arthritis ,    Abdominal   Peds  Hematology  On eliquis    Anesthesia Other Findings   Reproductive/Obstetrics                              Anesthesia Physical Anesthesia Plan  ASA: 3  Anesthesia Plan: General   Post-op Pain Management: Ofirmev IV (intra-op)*, Toradol IV (intra-op)* and Precedex   Induction: Intravenous  PONV Risk Score and Plan: 2 and Treatment may vary due to age or medical condition, Ondansetron and Propofol infusion  Airway Management Planned: Oral ETT  Additional Equipment: None  Intra-op Plan:   Post-operative Plan: Extubation in OR  Informed Consent: I have reviewed the patients History and Physical, chart, labs and discussed the procedure including the risks, benefits and alternatives for the proposed anesthesia with the patient or authorized representative who has indicated his/her understanding and acceptance.     Dental advisory given  Plan Discussed with: CRNA and Anesthesiologist  Anesthesia Plan Comments: (See PAT note from 2/28 by Sherlie Ban PA-C )          Anesthesia Quick Evaluation

## 2023-04-27 ENCOUNTER — Inpatient Hospital Stay (HOSPITAL_COMMUNITY): Payer: Self-pay | Admitting: Medical

## 2023-04-27 ENCOUNTER — Encounter (HOSPITAL_COMMUNITY): Payer: Self-pay | Admitting: Urology

## 2023-04-27 ENCOUNTER — Encounter (HOSPITAL_COMMUNITY)
Admission: RE | Disposition: A | Discharge status: Home / Self Care | Payer: Self-pay | Source: Home / Self Care | Attending: Urology

## 2023-04-27 ENCOUNTER — Inpatient Hospital Stay (HOSPITAL_COMMUNITY): Payer: Self-pay | Admitting: Certified Registered"

## 2023-04-27 ENCOUNTER — Other Ambulatory Visit: Payer: Self-pay

## 2023-04-27 ENCOUNTER — Telehealth: Payer: Self-pay

## 2023-04-27 ENCOUNTER — Telehealth: Payer: Self-pay | Admitting: Neurology

## 2023-04-27 ENCOUNTER — Inpatient Hospital Stay (HOSPITAL_COMMUNITY)
Admission: RE | Admit: 2023-04-27 | Discharge: 2023-04-29 | DRG: 658 | Disposition: A | Discharge status: Home / Self Care | Payer: Medicare PPO | Attending: Urology | Admitting: Urology

## 2023-04-27 DIAGNOSIS — Z9049 Acquired absence of other specified parts of digestive tract: Secondary | ICD-10-CM

## 2023-04-27 DIAGNOSIS — Z888 Allergy status to other drugs, medicaments and biological substances status: Secondary | ICD-10-CM | POA: Diagnosis not present

## 2023-04-27 DIAGNOSIS — N281 Cyst of kidney, acquired: Secondary | ICD-10-CM | POA: Diagnosis present

## 2023-04-27 DIAGNOSIS — F419 Anxiety disorder, unspecified: Secondary | ICD-10-CM | POA: Diagnosis present

## 2023-04-27 DIAGNOSIS — R338 Other retention of urine: Secondary | ICD-10-CM | POA: Diagnosis present

## 2023-04-27 DIAGNOSIS — Q272 Other congenital malformations of renal artery: Secondary | ICD-10-CM

## 2023-04-27 DIAGNOSIS — Z8249 Family history of ischemic heart disease and other diseases of the circulatory system: Secondary | ICD-10-CM | POA: Diagnosis not present

## 2023-04-27 DIAGNOSIS — Z981 Arthrodesis status: Secondary | ICD-10-CM

## 2023-04-27 DIAGNOSIS — E7800 Pure hypercholesterolemia, unspecified: Secondary | ICD-10-CM | POA: Diagnosis not present

## 2023-04-27 DIAGNOSIS — Z880 Allergy status to penicillin: Secondary | ICD-10-CM | POA: Diagnosis not present

## 2023-04-27 DIAGNOSIS — D3501 Benign neoplasm of right adrenal gland: Secondary | ICD-10-CM | POA: Diagnosis not present

## 2023-04-27 DIAGNOSIS — N2889 Other specified disorders of kidney and ureter: Principal | ICD-10-CM | POA: Diagnosis present

## 2023-04-27 DIAGNOSIS — Z87891 Personal history of nicotine dependence: Secondary | ICD-10-CM | POA: Diagnosis not present

## 2023-04-27 DIAGNOSIS — Z79899 Other long term (current) drug therapy: Secondary | ICD-10-CM

## 2023-04-27 DIAGNOSIS — Z96612 Presence of left artificial shoulder joint: Secondary | ICD-10-CM | POA: Diagnosis present

## 2023-04-27 DIAGNOSIS — E876 Hypokalemia: Secondary | ICD-10-CM | POA: Diagnosis present

## 2023-04-27 DIAGNOSIS — I10 Essential (primary) hypertension: Secondary | ICD-10-CM | POA: Diagnosis present

## 2023-04-27 DIAGNOSIS — Z7901 Long term (current) use of anticoagulants: Secondary | ICD-10-CM

## 2023-04-27 DIAGNOSIS — I2510 Atherosclerotic heart disease of native coronary artery without angina pectoris: Secondary | ICD-10-CM | POA: Diagnosis not present

## 2023-04-27 DIAGNOSIS — D1771 Benign lipomatous neoplasm of kidney: Secondary | ICD-10-CM | POA: Diagnosis not present

## 2023-04-27 DIAGNOSIS — Z9079 Acquired absence of other genital organ(s): Secondary | ICD-10-CM

## 2023-04-27 DIAGNOSIS — I4819 Other persistent atrial fibrillation: Secondary | ICD-10-CM | POA: Diagnosis present

## 2023-04-27 DIAGNOSIS — H9192 Unspecified hearing loss, left ear: Secondary | ICD-10-CM | POA: Diagnosis present

## 2023-04-27 DIAGNOSIS — N401 Enlarged prostate with lower urinary tract symptoms: Secondary | ICD-10-CM | POA: Diagnosis present

## 2023-04-27 DIAGNOSIS — Z8546 Personal history of malignant neoplasm of prostate: Secondary | ICD-10-CM

## 2023-04-27 DIAGNOSIS — Z823 Family history of stroke: Secondary | ICD-10-CM

## 2023-04-27 DIAGNOSIS — D497 Neoplasm of unspecified behavior of endocrine glands and other parts of nervous system: Secondary | ICD-10-CM | POA: Diagnosis not present

## 2023-04-27 DIAGNOSIS — I4892 Unspecified atrial flutter: Secondary | ICD-10-CM | POA: Diagnosis not present

## 2023-04-27 DIAGNOSIS — E279 Disorder of adrenal gland, unspecified: Secondary | ICD-10-CM | POA: Diagnosis not present

## 2023-04-27 DIAGNOSIS — G20A1 Parkinson's disease without dyskinesia, without mention of fluctuations: Secondary | ICD-10-CM | POA: Diagnosis present

## 2023-04-27 DIAGNOSIS — R39198 Other difficulties with micturition: Secondary | ICD-10-CM | POA: Diagnosis not present

## 2023-04-27 DIAGNOSIS — Z83438 Family history of other disorder of lipoprotein metabolism and other lipidemia: Secondary | ICD-10-CM

## 2023-04-27 DIAGNOSIS — D49511 Neoplasm of unspecified behavior of right kidney: Secondary | ICD-10-CM | POA: Diagnosis not present

## 2023-04-27 DIAGNOSIS — D3500 Benign neoplasm of unspecified adrenal gland: Secondary | ICD-10-CM | POA: Diagnosis not present

## 2023-04-27 HISTORY — DX: Parkinson's disease without dyskinesia, without mention of fluctuations: G20.A1

## 2023-04-27 HISTORY — PX: ROBOT ASSISTED LAPAROSCOPIC NEPHRECTOMY: SHX5140

## 2023-04-27 LAB — HEMOGLOBIN AND HEMATOCRIT, BLOOD
HCT: 43.4 % (ref 39.0–52.0)
Hemoglobin: 14.4 g/dL (ref 13.0–17.0)

## 2023-04-27 SURGERY — NEPHRECTOMY, RADICAL, ROBOT-ASSISTED, LAPAROSCOPIC, ADULT
Anesthesia: General | Laterality: Right

## 2023-04-27 MED ORDER — ROCURONIUM BROMIDE 10 MG/ML (PF) SYRINGE
PREFILLED_SYRINGE | INTRAVENOUS | Status: AC
  Filled 2023-04-27: qty 10

## 2023-04-27 MED ORDER — FENTANYL CITRATE PF 50 MCG/ML IJ SOSY
25.0000 ug | PREFILLED_SYRINGE | INTRAMUSCULAR | Status: DC | PRN
  Administered 2023-04-27 (×3): 50 ug via INTRAVENOUS

## 2023-04-27 MED ORDER — OXYCODONE HCL 5 MG PO TABS: 5.0000 mg | ORAL_TABLET | Freq: Once | ORAL | Status: DC | PRN

## 2023-04-27 MED ORDER — ONDANSETRON HCL 4 MG/2ML IJ SOLN
INTRAMUSCULAR | Status: DC | PRN
  Administered 2023-04-27: 4 mg via INTRAVENOUS

## 2023-04-27 MED ORDER — HYDROMORPHONE HCL 1 MG/ML IJ SOLN
INTRAMUSCULAR | Status: AC
  Filled 2023-04-27: qty 1

## 2023-04-27 MED ORDER — BUPIVACAINE LIPOSOME 1.3 % IJ SUSP
INTRAMUSCULAR | Status: AC
  Filled 2023-04-27: qty 20

## 2023-04-27 MED ORDER — SODIUM CHLORIDE (PF) 0.9 % IJ SOLN
INTRAMUSCULAR | Status: DC | PRN
  Administered 2023-04-27: 20 mL

## 2023-04-27 MED ORDER — POTASSIUM CHLORIDE IN NACL 40-0.9 MEQ/L-% IV SOLN
INTRAVENOUS | Status: AC
  Filled 2023-04-27 (×3): qty 1000

## 2023-04-27 MED ORDER — ONDANSETRON HCL 4 MG/2ML IJ SOLN
INTRAMUSCULAR | Status: AC
  Filled 2023-04-27: qty 2

## 2023-04-27 MED ORDER — SODIUM CHLORIDE (PF) 0.9 % IJ SOLN
INTRAMUSCULAR | Status: AC
  Filled 2023-04-27: qty 20

## 2023-04-27 MED ORDER — METOPROLOL TARTRATE 50 MG PO TABS
50.0000 mg | ORAL_TABLET | Freq: Two times a day (BID) | ORAL | Status: DC
  Administered 2023-04-27 – 2023-04-29 (×4): 50 mg via ORAL
  Filled 2023-04-27 (×4): qty 1

## 2023-04-27 MED ORDER — OXYCODONE HCL 5 MG PO TABS
5.0000 mg | ORAL_TABLET | ORAL | Status: DC | PRN
  Administered 2023-04-27: 10 mg via ORAL
  Administered 2023-04-28: 5 mg via ORAL
  Administered 2023-04-28 (×3): 10 mg via ORAL
  Administered 2023-04-29 (×2): 5 mg via ORAL
  Filled 2023-04-27 (×3): qty 2
  Filled 2023-04-27 (×2): qty 1
  Filled 2023-04-27: qty 2
  Filled 2023-04-27: qty 1

## 2023-04-27 MED ORDER — LIDOCAINE HCL (PF) 2 % IJ SOLN
INTRAMUSCULAR | Status: DC | PRN
Start: 2023-04-27 — End: 2023-04-27
  Administered 2023-04-27: 60 mg via INTRADERMAL

## 2023-04-27 MED ORDER — LACTATED RINGERS IV SOLN: INTRAVENOUS | Status: DC

## 2023-04-27 MED ORDER — TRAZODONE HCL 100 MG PO TABS
100.0000 mg | ORAL_TABLET | Freq: Every day | ORAL | Status: DC
  Administered 2023-04-27 – 2023-04-28 (×2): 100 mg via ORAL
  Filled 2023-04-27 (×2): qty 1

## 2023-04-27 MED ORDER — FENTANYL CITRATE (PF) 100 MCG/2ML IJ SOLN
INTRAMUSCULAR | Status: AC
  Filled 2023-04-27: qty 2

## 2023-04-27 MED ORDER — PHENYLEPHRINE 80 MCG/ML (10ML) SYRINGE FOR IV PUSH (FOR BLOOD PRESSURE SUPPORT)
PREFILLED_SYRINGE | INTRAVENOUS | Status: AC
  Filled 2023-04-27: qty 10

## 2023-04-27 MED ORDER — ONDANSETRON HCL 4 MG/2ML IJ SOLN: 4.0000 mg | Freq: Once | INTRAMUSCULAR | Status: DC | PRN

## 2023-04-27 MED ORDER — CARBIDOPA-LEVODOPA 25-100 MG PO TABS
1.5000 | ORAL_TABLET | Freq: Three times a day (TID) | ORAL | Status: DC
  Administered 2023-04-27 – 2023-04-29 (×6): 1.5 via ORAL
  Filled 2023-04-27 (×6): qty 2

## 2023-04-27 MED ORDER — FENTANYL CITRATE PF 50 MCG/ML IJ SOSY
PREFILLED_SYRINGE | INTRAMUSCULAR | Status: AC
  Filled 2023-04-27: qty 2

## 2023-04-27 MED ORDER — SODIUM CHLORIDE (PF) 0.9 % IJ SOLN
INTRAMUSCULAR | Status: AC
  Filled 2023-04-27: qty 40

## 2023-04-27 MED ORDER — FENTANYL CITRATE (PF) 100 MCG/2ML IJ SOLN
INTRAMUSCULAR | Status: DC | PRN
Start: 2023-04-27 — End: 2023-04-27
  Administered 2023-04-27 (×2): 50 ug via INTRAVENOUS

## 2023-04-27 MED ORDER — ORAL CARE MOUTH RINSE: 15.0000 mL | Freq: Once | OROMUCOSAL | Status: AC

## 2023-04-27 MED ORDER — SENNOSIDES-DOCUSATE SODIUM 8.6-50 MG PO TABS
2.0000 | ORAL_TABLET | Freq: Every day | ORAL | Status: DC
  Administered 2023-04-27: 2 via ORAL
  Filled 2023-04-27 (×2): qty 2

## 2023-04-27 MED ORDER — BUPIVACAINE LIPOSOME 1.3 % IJ SUSP
INTRAMUSCULAR | Status: DC | PRN
  Administered 2023-04-27: 20 mL

## 2023-04-27 MED ORDER — HYDROMORPHONE HCL 1 MG/ML IJ SOLN
0.2500 mg | INTRAMUSCULAR | Status: DC | PRN
  Administered 2023-04-27 (×3): 0.5 mg via INTRAVENOUS

## 2023-04-27 MED ORDER — ACETAMINOPHEN 10 MG/ML IV SOLN
1000.0000 mg | Freq: Four times a day (QID) | INTRAVENOUS | Status: AC
  Administered 2023-04-27 – 2023-04-28 (×3): 1000 mg via INTRAVENOUS
  Filled 2023-04-27 (×3): qty 100

## 2023-04-27 MED ORDER — MIDAZOLAM HCL 2 MG/2ML IJ SOLN
1.0000 mg | Freq: Once | INTRAMUSCULAR | Status: AC
  Administered 2023-04-27: 1 mg via INTRAVENOUS

## 2023-04-27 MED ORDER — MIDAZOLAM HCL 2 MG/2ML IJ SOLN
INTRAMUSCULAR | Status: AC
  Filled 2023-04-27: qty 2

## 2023-04-27 MED ORDER — PROPOFOL 10 MG/ML IV BOLUS
INTRAVENOUS | Status: AC
  Filled 2023-04-27: qty 20

## 2023-04-27 MED ORDER — MIDAZOLAM HCL 2 MG/2ML IJ SOLN
INTRAMUSCULAR | Status: DC | PRN
  Administered 2023-04-27: 2 mg via INTRAVENOUS

## 2023-04-27 MED ORDER — HYDROMORPHONE HCL 1 MG/ML IJ SOLN: 0.5000 mg | INTRAMUSCULAR | Status: DC | PRN

## 2023-04-27 MED ORDER — PROPOFOL 10 MG/ML IV BOLUS
INTRAVENOUS | Status: DC | PRN
  Administered 2023-04-27: 120 mg via INTRAVENOUS

## 2023-04-27 MED ORDER — OXYCODONE HCL 5 MG PO TABS: 5.0000 mg | ORAL_TABLET | Freq: Four times a day (QID) | ORAL | 0 refills | Status: DC | PRN

## 2023-04-27 MED ORDER — CEFAZOLIN SODIUM-DEXTROSE 2-4 GM/100ML-% IV SOLN
2.0000 g | Freq: Once | INTRAVENOUS | Status: AC
  Administered 2023-04-27: 2 g via INTRAVENOUS
  Filled 2023-04-27: qty 100

## 2023-04-27 MED ORDER — CHLORHEXIDINE GLUCONATE 0.12 % MT SOLN
15.0000 mL | Freq: Once | OROMUCOSAL | Status: AC
  Administered 2023-04-27: 15 mL via OROMUCOSAL

## 2023-04-27 MED ORDER — ROCURONIUM BROMIDE 10 MG/ML (PF) SYRINGE
PREFILLED_SYRINGE | INTRAVENOUS | Status: DC | PRN
  Administered 2023-04-27: 10 mg via INTRAVENOUS
  Administered 2023-04-27: 70 mg via INTRAVENOUS
  Administered 2023-04-27 (×2): 20 mg via INTRAVENOUS

## 2023-04-27 MED ORDER — SENNOSIDES-DOCUSATE SODIUM 8.6-50 MG PO TABS: 1.0000 | ORAL_TABLET | Freq: Two times a day (BID) | ORAL | 0 refills | Status: DC

## 2023-04-27 MED ORDER — OXYCODONE HCL 5 MG/5ML PO SOLN: 5.0000 mg | Freq: Once | ORAL | Status: DC | PRN

## 2023-04-27 MED ORDER — SUGAMMADEX SODIUM 200 MG/2ML IV SOLN
INTRAVENOUS | Status: DC | PRN
  Administered 2023-04-27: 200 mg via INTRAVENOUS

## 2023-04-27 MED ORDER — CARBIDOPA-LEVODOPA 25-100 MG PO TABS
1.5000 | ORAL_TABLET | Freq: Three times a day (TID) | ORAL | Status: DC
Start: 2023-04-27 — End: 2023-04-27

## 2023-04-27 MED ORDER — PHENYLEPHRINE 80 MCG/ML (10ML) SYRINGE FOR IV PUSH (FOR BLOOD PRESSURE SUPPORT)
PREFILLED_SYRINGE | INTRAVENOUS | Status: DC | PRN
  Administered 2023-04-27: 80 ug via INTRAVENOUS
  Administered 2023-04-27: 120 ug via INTRAVENOUS

## 2023-04-27 MED ORDER — DEXAMETHASONE SODIUM PHOSPHATE 10 MG/ML IJ SOLN
INTRAMUSCULAR | Status: AC
  Filled 2023-04-27: qty 1

## 2023-04-27 MED ORDER — PROPOFOL 500 MG/50ML IV EMUL
INTRAVENOUS | Status: DC | PRN
  Administered 2023-04-27: 25 ug/kg/min via INTRAVENOUS

## 2023-04-27 MED ORDER — CARBIDOPA-LEVODOPA ER 50-200 MG PO TBCR
1.0000 | EXTENDED_RELEASE_TABLET | Freq: Every day | ORAL | Status: DC
Start: 2023-04-27 — End: 2023-04-29
  Administered 2023-04-27 – 2023-04-28 (×2): 1 via ORAL
  Filled 2023-04-27 (×3): qty 1

## 2023-04-27 MED ORDER — PHENYLEPHRINE HCL-NACL 20-0.9 MG/250ML-% IV SOLN
INTRAVENOUS | Status: DC | PRN
  Administered 2023-04-27: 40 ug/min via INTRAVENOUS
  Administered 2023-04-27: 60 ug/min via INTRAVENOUS
  Administered 2023-04-27: 40 ug/min via INTRAVENOUS

## 2023-04-27 MED ORDER — FENTANYL CITRATE PF 50 MCG/ML IJ SOSY
PREFILLED_SYRINGE | INTRAMUSCULAR | Status: AC
  Filled 2023-04-27: qty 1

## 2023-04-27 MED ORDER — DEXMEDETOMIDINE HCL IN NACL 80 MCG/20ML IV SOLN
INTRAVENOUS | Status: DC | PRN
Start: 2023-04-27 — End: 2023-04-27
  Administered 2023-04-27 (×3): 4 ug via INTRAVENOUS

## 2023-04-27 MED ORDER — AMLODIPINE BESYLATE 10 MG PO TABS
10.0000 mg | ORAL_TABLET | Freq: Every day | ORAL | Status: DC
  Administered 2023-04-28 – 2023-04-29 (×2): 10 mg via ORAL
  Filled 2023-04-27 (×2): qty 1

## 2023-04-27 SURGICAL SUPPLY — 61 items
BAG COUNTER SPONGE SURGICOUNT (BAG) ×1 IMPLANT
BAG LAPAROSCOPIC 12 15 PORT 16 (BASKET) ×1 IMPLANT
BAG RETRIEVAL 12/15 (BASKET) ×1 IMPLANT
CHLORAPREP W/TINT 26 (MISCELLANEOUS) ×1 IMPLANT
CLIP LIGATING HEM O LOK PURPLE (MISCELLANEOUS) ×1 IMPLANT
CLIP LIGATING HEMO LOK XL GOLD (MISCELLANEOUS) ×1 IMPLANT
CLIP LIGATING HEMO O LOK GREEN (MISCELLANEOUS) ×1 IMPLANT
COVER MAYO STAND STRL (DRAPES) IMPLANT
COVER SURGICAL LIGHT HANDLE (MISCELLANEOUS) ×1 IMPLANT
COVER TIP SHEARS 8 DVNC (MISCELLANEOUS) ×1 IMPLANT
CUTTER ECHEON FLEX ENDO 45 340 (ENDOMECHANICALS) IMPLANT
DERMABOND ADVANCED .7 DNX12 (GAUZE/BANDAGES/DRESSINGS) ×2 IMPLANT
DERMABOND ADVANCED .7 DNX6 (GAUZE/BANDAGES/DRESSINGS) IMPLANT
DISSECT BALLN SPACEMKR + OVL (BALLOONS) ×1 IMPLANT
DISSECTOR BALLN SPACEMKR + OVL (BALLOONS) IMPLANT
DRAIN CHANNEL 15F RND FF 3/16 (WOUND CARE) IMPLANT
DRAPE ARM DVNC X/XI (DISPOSABLE) ×4 IMPLANT
DRAPE COLUMN DVNC XI (DISPOSABLE) ×1 IMPLANT
DRAPE INCISE IOBAN 66X45 STRL (DRAPES) ×1 IMPLANT
DRAPE SHEET LG 3/4 BI-LAMINATE (DRAPES) ×1 IMPLANT
DRIVER NDL LRG 8 DVNC XI (INSTRUMENTS) ×2 IMPLANT
DRIVER NDLE LRG 8 DVNC XI (INSTRUMENTS) ×2 IMPLANT
ELECT PENCIL ROCKER SW 15FT (MISCELLANEOUS) ×1 IMPLANT
ELECT REM PT RETURN 15FT ADLT (MISCELLANEOUS) ×1 IMPLANT
EVACUATOR SILICONE 100CC (DRAIN) IMPLANT
FORCEPS BPLR FENES DVNC XI (FORCEP) ×1 IMPLANT
FORCEPS PROGRASP DVNC XI (FORCEP) ×1 IMPLANT
GLOVE BIO SURGEON STRL SZ 6.5 (GLOVE) ×1 IMPLANT
GLOVE SURG LX STRL 7.5 STRW (GLOVE) ×2 IMPLANT
GOWN STRL REUS W/ TWL XL LVL3 (GOWN DISPOSABLE) ×2 IMPLANT
GOWN STRL SURGICAL XL XLNG (GOWN DISPOSABLE) ×1 IMPLANT
HOLDER FOLEY CATH W/STRAP (MISCELLANEOUS) ×1 IMPLANT
IRRIG SUCT STRYKERFLOW 2 WTIP (MISCELLANEOUS) ×1 IMPLANT
IRRIGATION SUCT STRKRFLW 2 WTP (MISCELLANEOUS) ×1 IMPLANT
KIT BASIN OR (CUSTOM PROCEDURE TRAY) ×1 IMPLANT
KIT TURNOVER KIT A (KITS) IMPLANT
LOOP VESSEL MAXI BLUE (MISCELLANEOUS) IMPLANT
NDL INSUFFLATION 14GA 120MM (NEEDLE) ×1 IMPLANT
NEEDLE INSUFFLATION 14GA 120MM (NEEDLE) ×1 IMPLANT
PORT ACCESS TROCAR AIRSEAL 12 (TROCAR) ×1 IMPLANT
PROTECTOR NERVE ULNAR (MISCELLANEOUS) ×2 IMPLANT
RELOAD STAPLE 45 2.6 WHT THIN (STAPLE) IMPLANT
SCISSORS MNPLR CVD DVNC XI (INSTRUMENTS) ×1 IMPLANT
SEAL UNIV 5-12 XI (MISCELLANEOUS) ×4 IMPLANT
SET TRI-LUMEN FLTR TB AIRSEAL (TUBING) ×1 IMPLANT
SOL ELECTROSURG ANTI STICK (MISCELLANEOUS) ×1 IMPLANT
SOLUTION ELECTROSURG ANTI STCK (MISCELLANEOUS) ×1 IMPLANT
SPIKE FLUID TRANSFER (MISCELLANEOUS) ×1 IMPLANT
SPONGE T-LAP 4X18 ~~LOC~~+RFID (SPONGE) IMPLANT
STAPLE RELOAD 45 WHT (STAPLE) ×3 IMPLANT
SUT ETHILON 3 0 PS 1 (SUTURE) IMPLANT
SUT MNCRL AB 4-0 PS2 18 (SUTURE) ×2 IMPLANT
SUT PDS AB 1 CT1 27 (SUTURE) ×3 IMPLANT
SUT VIC AB 2-0 SH 27X BRD (SUTURE) ×1 IMPLANT
SUT VICRYL 0 UR6 27IN ABS (SUTURE) IMPLANT
TOWEL OR 17X26 10 PK STRL BLUE (TOWEL DISPOSABLE) ×1 IMPLANT
TRAY FOLEY MTR SLVR 16FR STAT (SET/KITS/TRAYS/PACK) ×1 IMPLANT
TRAY LAPAROSCOPIC (CUSTOM PROCEDURE TRAY) ×1 IMPLANT
TROCAR Z THREAD OPTICAL 12X100 (TROCAR) ×1 IMPLANT
TROCAR Z-THREAD OPTICAL 5X100M (TROCAR) IMPLANT
WATER STERILE IRR 1000ML POUR (IV SOLUTION) ×1 IMPLANT

## 2023-04-27 NOTE — Progress Notes (Signed)
 Report called to RN Sheryle Hail. Pt transported to room 1432, CNA Shajuana obtained v/s and RN Sheryle Hail notified of pt arrival to room 1432. Family notified of pt arrival to room.

## 2023-04-27 NOTE — H&P (Signed)
 Patrick Gray is an 68 y.o. male.    Chief Complaint: Pre-OP RIGHT Robotic Retroperitoneal Radial Nephrectomy + Adrenalectomy  HPI:   1 - Very Low Risk Prostate Cancer - incidental G1 disease in TURP chips 2022. ON surveillance  2024 - PSA 1.6   2 - Urinary Retention - retention pre-TURP 2022. UDS with preserved bladder function   3 - Right Renal Mass - 5cm Rt upper endophytic mass that abuts sinus fat, some small areas of fat, but not typical AML appearnace. Ipsilateral RLP cyst and Adrenal mass as well (adenoma favored). 2 artery (lower accessory anterior to IVC) / 1 vein right renovascular anatomy   PMH sig for AFib / Ablation / Eliquus (follows M. Skains), Parkinsons (does not interfear with ADL's), open chole, open appy, ortho surgery. His wife Patrick Gray is involved. His PCP is [name] MD.   Today " Patrick Gray " is seen to proceed with RIGHT robotic radical nephrecotmy. No interval fevers. Hgb 14.8, Cr 0.66, Cards clearance on file and has held Eliquus as instructed.   Past Medical History:  Diagnosis Date   Acoustic neuroma (HCC)    left   Anxiety    Arthritis    Complication of anesthesia    Delusion, violent, hallucination, hospitalized 8 days, urine retention   Coronary artery disease    Deafness in left ear    Delusion (HCC)    after fall on pain meds and prednisone   Dysrhythmia    PAF   Foley catheter in place    H/O cardiac radiofrequency ablation    HTN (hypertension)    Hypercholesterolemia    Hypopotassemia    Insomnia    Paroxysmal atrial fibrillation (HCC)    Radiculopathy    after fall   Tremor    left hand   Vitamin B 12 deficiency     Past Surgical History:  Procedure Laterality Date   ACOUSTIC NEUROMA RESECTION Left 19990   ANTERIOR CERVICAL DECOMP/DISCECTOMY FUSION N/A 04/26/2020   Procedure: Anterior Cervical Decompression Fusion - Cervical five-Cervical six - Cervical six-Cervical seven;  Surgeon: Donalee Citrin, MD;  Location: [facility] OR;  Service:  Neurosurgery;  Laterality: N/A;   APPENDECTOMY  1976   ATRIAL FIBRILLATION ABLATION N/A 12/19/2016   Procedure: ATRIAL FIBRILLATION ABLATION;  Surgeon: Hillis Range, MD;  Location: [facility] INVASIVE CV LAB;  Service: Cardiovascular;  Laterality: N/A;   CARDIAC CATHETERIZATION N/A 10/07/2015   Procedure: Left Heart Cath and Coronary Angiography;  Surgeon: Wendall Stade, MD;  Location: [facility] INVASIVE CV LAB;  Service: Cardiovascular;  Laterality: N/A;   CARDIOVERSION N/A 06/17/2013   Procedure: CARDIOVERSION;  Surgeon: Wendall Stade, MD;  Location: [facility] ENDOSCOPY;  Service: Cardiovascular;  Laterality: N/A;   CARDIOVERSION N/A 05/11/2016   Procedure: CARDIOVERSION;  Surgeon: Thurmon Fair, MD;  Location: [facility] ENDOSCOPY;  Service: Cardiovascular;  Laterality: N/A;   CARDIOVERSION N/A 05/24/2017   Procedure: CARDIOVERSION;  Surgeon: Quintella Reichert, MD;  Location: [facility] ENDOSCOPY;  Service: Cardiovascular;  Laterality: N/A;   CARDIOVERSION N/A 11/19/2017   Procedure: CARDIOVERSION;  Surgeon: Chrystie Nose, MD;  Location: [facility] ENDOSCOPY;  Service: Cardiovascular;  Laterality: N/A;   CHOLECYSTECTOMY  1976   CLUB FOOT RELEASE  1968   ELECTROPHYSIOLOGIC STUDY N/A 11/30/2015   Procedure: Atrial Fibrillation Ablation;  Surgeon: Hillis Range, MD;  Location: [facility] INVASIVE CV LAB;  Service: Cardiovascular;  Laterality: N/A;   TEE WITHOUT CARDIOVERSION N/A 05/11/2016   Procedure: TRANSESOPHAGEAL ECHOCARDIOGRAM (TEE);  Surgeon: Thurmon Fair, MD;  Location:  [facility] ENDOSCOPY;  Service: Cardiovascular;  Laterality: N/A;   TONSILLECTOMY  1967   TOTAL SHOULDER ARTHROPLASTY Left 06/15/2016   Procedure: TOTAL SHOULDER ARTHROPLASTY;  Surgeon: Jones Broom, MD;  Location: [facility] OR;  Service: Orthopedics;  Laterality: Left;  Left total shoulder arthroplasty   TRANSURETHRAL RESECTION OF PROSTATE N/A 07/02/2020   Procedure: TRANSURETHRAL RESECTION OF THE PROSTATE (TURP);  Surgeon: Crista Elliot, MD;  Location: [facility] ORS;  Service: Urology;   Laterality: N/A;    Family History  Problem Relation Age of Onset   Hypertension Mother    Stroke Mother    CVA Mother    Tremor Mother    Hypertension Father    Hypertension Brother    Hypercholesterolemia Brother    Atrial fibrillation Brother    Congestive Heart Failure Brother    Asperger's syndrome Child    Social History:  reports that he has quit smoking. He has never used smokeless tobacco. He reports current alcohol use of about 2.0 - 3.0 standard drinks of alcohol per week. He reports that he does not use drugs.  Allergies:  Allergies  Allergen Reactions   Prednisolone Other (See Comments)    Insomnia    Penicillins Rash and Other (See Comments)         No medications prior to admission.    No results found for this or any previous visit (from the past 48 hours). No results found.  Review of Systems  Constitutional:  Negative for chills and fever.  All other systems reviewed and are negative.   There were no vitals taken for this visit. Physical Exam Vitals reviewed.  HENT:     Head: Normocephalic.     Mouth/Throat:     Mouth: Mucous membranes are moist.  Eyes:     Pupils: Pupils are equal, round, and reactive to light.  Cardiovascular:     Rate and Rhythm: Normal rate.  Abdominal:     General: Abdomen is flat.     Comments: Multiple prior scars w/o hernias.   Genitourinary:    Comments: No CVAT at present Musculoskeletal:        General: Normal range of motion.     Cervical back: Normal range of motion.  Skin:    General: Skin is warm.  Neurological:     General: No focal deficit present.     Mental Status: He is alert.  Psychiatric:        Mood and Affect: Mood normal.      Assessment/Plan  Proceed as planned with RIGHT robotic radical (retroperitoneal) nephrecotmy + adrenal. Risks, benefits, alternatives, expected peri-op course discussed previously and reiterated today.   Loletta Parish., MD 04/27/2023, 6:42 AM

## 2023-04-27 NOTE — Discharge Instructions (Addendum)
 1- Drain Sites - You may have some mild persistent drainage from old drain site for several days, this is normal. This can be covered with cotton gauze for convenience.  2 - Stiches - Your stitches are all dissolvable. You may notice a "loose thread" at your incisions, these are normal and require no intervention. You may cut them flush to the skin with fingernail clippers if needed for comfort.  3 - Diet - No restrictions  4 - Activity - No heavy lifting / straining (any activities that require valsalva or "bearing down") x 4 weeks. Otherwise, no restrictions.  5 - Bathing - You may shower immediately. Do not take a bath or get into swimming pool where incision sites are submersed in water x 4 weeks.   6 - When to Call the Doctor - Call MD for any fever >102, any acute wound problems, or any severe nausea / vomiting. You can call the [facility] [phone]) 24 hours a day 365 days a year. It will roll-over to the answering service and on-call physician after hours.  7- please take flomax while your catheter in place. We will call you to schedule a short follow-up to remove your foley catheter. Flomax should help your bladder empty better when your catheter is removed.   Foley Catheter Care A soft, flexible tube (Foley catheter) may have been placed in your bladder to drain urine and fluid. Follow these instructions: Taking Care of the Catheter Keep the area where the catheter leaves your body clean.  Attach the catheter to the leg so there is no tension on the catheter.  Keep the drainage bag below the level of the bladder, but keep it OFF the floor.  Do not take long soaking baths. Your caregiver will give instructions about showering.  Wash your hands before touching ANYTHING related to the catheter or bag.  Using mild soap and warm water on a washcloth:  Clean the area closest to the catheter insertion site using a circular motion around the catheter.  Clean the catheter  itself by wiping AWAY from the insertion site for several inches down the tube.  NEVER wipe upward as this could sweep bacteria up into the urethra (tube in your body that normally drains the bladder) and cause infection.  Place a small amount of sterile lubricant at the tip of the penis where the catheter is entering.  Taking Care of the Drainage Bags Two drainage bags may be taken home: a large overnight drainage bag, and a smaller leg bag which fits underneath clothing.  It is okay to wear the overnight bag at any time, but NEVER wear the smaller leg bag at night.  Keep the drainage bag well below the level of your bladder. This prevents backflow of urine into the bladder and allows the urine to drain freely.  Anchor the tubing to your leg to prevent pulling or tension on the catheter. Use tape or a leg strap provided by the hospital.  Empty the drainage bag when it is 1/2 to 3/4 full. Wash your hands before and after touching the bag.  Periodically check the tubing for kinks to make sure there is no pressure on the tubing which could restrict the flow of urine.  Changing the Drainage Bags Cleanse both ends of the clean bag with alcohol before changing.  Pinch off the rubber catheter to avoid urine spillage during the disconnection.  Disconnect the dirty bag and connect the clean one.  Empty the dirty  bag carefully to avoid a urine spill.  Attach the new bag to the leg with tape or a leg strap.  Cleaning the Drainage Bags Whenever a drainage bag is disconnected, it must be cleaned quickly so it is ready for the next use.  Wash the bag in warm, soapy water.  Rinse the bag thoroughly with warm water.  Soak the bag for 30 minutes in a solution of [place] vinegar and water (1 cup vinegar to 1 quart warm water).  Rinse with warm water.  SEEK MEDICAL CARE IF:  You have chills or night sweats.  You are leaking around your catheter or have problems with your catheter. It is not uncommon to have  sporadic leakage around your catheter as a result of bladder spasms. If the leakage stops, there is not much need for concern. If you are uncertain, call your caregiver.  You develop side effects that you think are coming from your medicines.  SEEK IMMEDIATE MEDICAL CARE IF:  You are suddenly unable to urinate. Check to see if there are any kinks in the drainage tubing that may cause this. If you cannot find any kinks, call your caregiver immediately. This is an emergency.  You develop shortness of breath or chest pains.  Bleeding persists or clots develop in your urine.  You have a fever.  You develop pain in your back or over your lower belly (abdomen).  You develop pain or swelling in your legs.  Any problems you are having get worse rather than better.  MAKE SURE YOU:  Understand these instructions.  Will watch your condition.  Will get help right away if you are not doing well or get worse.

## 2023-04-27 NOTE — Telephone Encounter (Signed)
 Called Pt back and informed her of Dr. Iona Beard answer, she understood but was very focused on the pain in feet with her DX of dystonia. I told her to let him get the help he needs in hospital per Dr. Arbutus Leas and when he gets home and settled from the kidney removal if he needs to see Dr. Arbutus Leas  give [place] a call. She said okay thanks.

## 2023-04-27 NOTE — Transfer of Care (Signed)
 Immediate Anesthesia Transfer of Care Note  Patient: Patrick Gray  Procedure(s) Performed: XI RIGHT RETROPERITONEAL ROBOTIC ASSISTED LAPAROSCOPIC RADICAL NEPHRECTOMY (Right)  Patient Location: PACU  Anesthesia Type:General  Level of Consciousness: pateint uncooperative and confused  Airway & Oxygen Therapy: Patient Spontanous Breathing and Patient connected to face mask oxygen  Post-op Assessment: Report given to RN and Post -op Vital signs reviewed and stable  Post vital signs: Reviewed and stable  Last Vitals:  Vitals Value Taken Time  BP 151/97 04/27/23 1445  Temp    Pulse 89 04/27/23 1445  Resp 14 04/27/23 1448  SpO2 96 % 04/27/23 1445  Vitals shown include unfiled device data.  Last Pain:  Vitals:   04/27/23 1444  TempSrc:   PainSc: 8       Patients Stated Pain Goal: 5 (04/27/23 0900)  Complications: No notable events documented.

## 2023-04-27 NOTE — Anesthesia Procedure Notes (Signed)
 Procedure Name: Intubation Date/Time: 04/27/2023 12:00 PM  Performed by: Sindy Guadeloupe, CRNAPre-anesthesia Checklist: Patient identified, Emergency Drugs available, Suction available, Patient being monitored and Timeout performed Patient Re-evaluated:Patient Re-evaluated prior to induction Oxygen Delivery Method: Circle system utilized Preoxygenation: Pre-oxygenation with 100% oxygen Induction Type: IV induction Ventilation: Mask ventilation without difficulty Laryngoscope Size: Glidescope and 4 Grade View: Grade I Tube type: Oral Tube size: 7.5 mm Number of attempts: 1 Airway Equipment and Method: Stylet Placement Confirmation: ETT inserted through vocal cords under direct vision, positive ETCO2 and breath sounds checked- equal and bilateral Secured at: 23 cm Tube secured with: Tape Dental Injury: Teeth and Oropharynx as per pre-operative assessment

## 2023-04-27 NOTE — Telephone Encounter (Signed)
 Pt is having severe issues with dystonia in his left foot and toes. He is pacing day and night and no getting sleep. Lost 20 lbs over the last few months. Pt was pacing in pre-op room before his kidney surgery saying he was having "bad thoughts" about his symptoms. She is afraid he is borderline suicidal. She doesn't think the patient has been doing a good job letting Dr. Arbutus Leas know how bad his symptoms are.

## 2023-04-27 NOTE — Brief Op Note (Signed)
 04/27/2023  3:52 PM  PATIENT:  Patrick Gray  68 y.o. male  PRE-OPERATIVE DIAGNOSIS:  RIGHT RENAL MASS  POST-OPERATIVE DIAGNOSIS:  RIGHT RENAL MASS  PROCEDURE:  Procedure(s) with comments: XI RIGHT RETROPERITONEAL ROBOTIC ASSISTED LAPAROSCOPIC RADICAL NEPHRECTOMY (Right) - 180 MINUTES NEEDED FOR CASE  SURGEON:  Surgeons and Role:    * Manny, Delbert Phenix., MD - Primary  PHYSICIAN ASSISTANT:   ASSISTANTS: Zettie Pho MD   ANESTHESIA:   local and general  EBL:  50 mL   BLOOD ADMINISTERED:none  DRAINS:  foley to gravity    LOCAL MEDICATIONS USED:  MARCAINE     SPECIMEN:  Source of Specimen:  Rt kidney + adrenal  DISPOSITION OF SPECIMEN:  PATHOLOGY  COUNTS:  YES  TOURNIQUET:  * No tourniquets in log *  DICTATION: .Other Dictation: Dictation Number [id number]  PLAN OF CARE: Admit to inpatient   PATIENT DISPOSITION:  PACU - hemodynamically stable.   Delay start of Pharmacological VTE agent (>24hrs) due to surgical blood loss or risk of bleeding: yes

## 2023-04-28 LAB — BASIC METABOLIC PANEL
Anion gap: 6 (ref 5–15)
BUN: 15 mg/dL (ref 8–23)
CO2: 27 mmol/L (ref 22–32)
Calcium: 8.3 mg/dL — ABNORMAL LOW (ref 8.9–10.3)
Chloride: 104 mmol/L (ref 98–111)
Creatinine, Ser: 0.94 mg/dL (ref 0.61–1.24)
GFR, Estimated: >60 mL/min (ref >60)
Glucose, Bld: 99 mg/dL (ref 70–99)
Potassium: 3.6 mmol/L (ref 3.5–5.1)
Sodium: 137 mmol/L (ref 135–145)

## 2023-04-28 LAB — CBC
HCT: 39 % (ref 39.0–52.0)
Hemoglobin: 13.3 g/dL (ref 13.0–17.0)
MCH: 32.1 pg (ref 26.0–34.0)
MCHC: 34.1 g/dL (ref 30.0–36.0)
MCV: 94.2 fL (ref 80.0–100.0)
Platelets: 98 10*3/uL — ABNORMAL LOW (ref 150–400)
RBC: 4.14 MIL/uL — ABNORMAL LOW (ref 4.22–5.81)
RDW: 13.5 % (ref 11.5–15.5)
WBC: 4.8 10*3/uL (ref 4.0–10.5)
nRBC: 0 % (ref 0.0–0.2)

## 2023-04-28 MED ORDER — ACETAMINOPHEN 10 MG/ML IV SOLN
1000.0000 mg | Freq: Four times a day (QID) | INTRAVENOUS | Status: AC
  Administered 2023-04-28 – 2023-04-29 (×2): 1000 mg via INTRAVENOUS
  Filled 2023-04-28 (×3): qty 100

## 2023-04-28 NOTE — Progress Notes (Signed)
   04/28/23 2137  Provider Notification  Provider Name/Title Dr. Armandina Gemma  Date Provider Notified 04/28/23  Time Provider Notified 2138  Method of Notification Call  Notification Reason Other (Comment) (Patient refusing O2 for 89% saturation; refusing bladder scan at this time.)  Provider response Other (Comment) (Encourage to use O2 and continue to monitor patient.)  Date of Provider Response 04/28/23  Time of Provider Response 2139

## 2023-04-28 NOTE — Progress Notes (Signed)
 Paged Dr. Dalbert Mayotte. No UOP since 0600 when foley was removed. Last BS 203. Encouraged pt to continue drinking as tolerated. Per Dr. Jefm Miles pt more time to try to go on his own. Straight cath if BS >600 or pt having significant discomfort. If Pt is unable to urinate by 1900 have them stay the night.

## 2023-04-28 NOTE — Progress Notes (Cosign Needed)
 1 Day Post-Op  Subjective: The patient is doing well.  No nausea or vomiting. Pain is adequately controlled. Pt currently has been unable to void - bladder scanning around 200 earlier today.   Objective: Vital signs in last 24 hours: Temp:  [98 F (36.7 C)] 98 F (36.7 C) (03/08 1331) Pulse Rate:  [66-90] 68 (03/08 1331) Resp:  [14-18] 14 (03/08 1331) BP: (89-129)/(61-76) 111/71 (03/08 1440) SpO2:  [90 %] 90 % (03/08 1331)  Intake/Output from previous day: 03/07 0701 - 03/08 0700 In: 2601 [I.V.:2301; IV Piggyback:300] Out: 1350 [Urine:1300; Blood:50] Intake/Output this shift: No intake/output data recorded.  Physical Exam:  General: Alert and oriented. CV: RRR Lungs: Clear bilaterally. GI: Soft, Nondistended. Incisions: Clean and dry. Urine: Clear Extremities: Nontender, no erythema, no edema.  Lab Results: Recent Labs    04/27/23 1506 04/28/23 0437  HGB 14.4 13.3  HCT 43.4 39.0          Recent Labs    04/28/23 0731  CREATININE 0.94           Results for orders placed or performed during the hospital encounter of 04/27/23 (from the past 24 hours)  CBC     Status: Abnormal   Collection Time: 04/28/23  4:37 AM  Result Value Ref Range   WBC 4.8 4.0 - 10.5 K/uL   RBC 4.14 (L) 4.22 - 5.81 MIL/uL   Hemoglobin 13.3 13.0 - 17.0 g/dL   HCT 40.9 81.1 - 91.4 %   MCV 94.2 80.0 - 100.0 fL   MCH 32.1 26.0 - 34.0 pg   MCHC 34.1 30.0 - 36.0 g/dL   RDW 78.2 95.6 - 21.3 %   Platelets 98 (L) 150 - 400 K/uL   nRBC 0.0 0.0 - 0.2 %  Basic metabolic panel     Status: Abnormal   Collection Time: 04/28/23  7:31 AM  Result Value Ref Range   Sodium 137 135 - 145 mmol/L   Potassium 3.6 3.5 - 5.1 mmol/L   Chloride 104 98 - 111 mmol/L   CO2 27 22 - 32 mmol/L   Glucose, Bld 99 70 - 99 mg/dL   BUN 15 8 - 23 mg/dL   Creatinine, Ser 0.86 0.61 - 1.24 mg/dL   Calcium 8.3 (L) 8.9 - 10.3 mg/dL   GFR, Estimated >57 >84 mL/min   Anion gap 6 5 - 15    Assessment/Plan: POD# 1 s/p  robotic partial nephrectomy.  1) Ambulate, Incentive spirometry 2) Advance diet as tolerated 3) Transition to oral pain medication 4) Continue TOV 5) Drain discontinued 6) Will keep 3/8, likely discharge 3/9   Roby Lofts, MD Resident Physician [facility]    LOS: 1 day   Zettie Pho 04/28/2023, 7:24 PM    Pt with slow void trial, ultimately held discharge on 3/8. Ambulating some but slower to mobilize. Keep in house on 3/8.  Matt R. Gay MD [facility]  Pager: [phone]

## 2023-04-28 NOTE — TOC Transition Note (Signed)
 Transition of Care [facility]) - Discharge Note   Patient Details  Name: Patrick Gray MRN: [id number] Date of Birth: 1955/02/26  [facility] CM/SW Contact:  Beckie Busing, RN Phone Number:[phone]  04/28/2023, 11:57 AM   Clinical Narrative:    Patient with discharge orders. No TOC needs noted.          Patient Goals and CMS Choice            Discharge Placement                       Discharge Plan and Services Additional resources added to the After Visit Summary for                                       Social Drivers of Health (SDOH) Interventions SDOH Screenings   Food Insecurity: Patient Unable To Answer (04/27/2023)  Housing: Patient Unable To Answer (04/27/2023)  Utilities: Patient Unable To Answer (04/27/2023)  Social Connections: Patient Unable To Answer (04/27/2023)  Tobacco Use: Medium Risk (04/27/2023)     Readmission Risk Interventions     No data to display

## 2023-04-28 NOTE — Progress Notes (Signed)
 Patient refused to be placed on oxygen tonight and refusing for me to do bladder scan, wants me leave so he can go to sleep now. Patient remains alert and oriented, answer questions appropriately. Will inform on-call MD.   04/28/23 2104  Oxygen Therapy  SpO2 (!) 89 %  O2 Device Room Air  Pain Assessment  Pain Scale 0-10  Pain Score 5  Pain Location Back  Pain Intervention(s) Medication (See eMAR)

## 2023-04-28 NOTE — Progress Notes (Signed)
   04/28/23 1152  TOC Brief Assessment  Insurance and Status Reviewed  Patient has primary care physician No (No PCP listed)  Home environment has been reviewed FRom home with spouse  Prior level of function: Independent  Readmission risk has been reviewed Yes  Transition of care needs no transition of care needs at this time

## 2023-04-28 NOTE — Discharge Summary (Addendum)
 Date of admission: 04/27/2023  Date of discharge: 04/28/2023  Admission diagnosis:  Renal mass, right [N28.89]   Discharge diagnosis:  Renal mass, right [N28.89]  Secondary diagnoses:   Active Ambulatory Problems    Diagnosis Date Noted   HTN (hypertension)    Hypercholesterolemia    Deafness in left ear    Atrial fibrillation (HCC)    Hypopotassemia    A-fib (HCC) 11/30/2015   S/P shoulder replacement, left 06/15/2016   Atrial flutter (HCC) 12/19/2016   Myelopathy (HCC) 04/26/2020   BPH (benign prostatic hyperplasia) 07/02/2020   Parkinson's disease without dyskinesia or fluctuating manifestations (HCC) 12/21/2021   Hypercoagulable state due to persistent atrial fibrillation (HCC) 08/02/2022   Resolved Ambulatory Problems    Diagnosis Date Noted   No Resolved Ambulatory Problems   Past Medical History:  Diagnosis Date   Acoustic neuroma (HCC)    Anxiety    Arthritis    Complication of anesthesia    Coronary artery disease    Delusion (HCC)    Dysrhythmia    Foley catheter in place    H/O cardiac radiofrequency ablation    Insomnia    Parkinson's disease (HCC)    Paroxysmal atrial fibrillation (HCC)    Radiculopathy    Tremor    Vitamin B 12 deficiency      History and Physical: For full details, please see admission history and physical. Briefly, Patrick Gray is a 68 y.o. year old patient who was admitted with right right and adrenal mass.   Hospital Course: Pt admitted and underwent robotic assisted laparoscopic retroperitoneal right nephrectomy and adrenelectomy on 04/27/2023. Their hospital course was unremarkable. By POD1, they were tolerating a regular diet, voiding spontaneously, pain was controlled with oral medications, and they were deemed appropriate for discharge.   Their course was complicated by: None   On the day of discharge, the patient was tolerating a regular diet and their pain was well controlled. They were determined to be stable for discharge  home  Laboratory values:  Recent Labs    04/27/23 1506 04/28/23 0437  HGB 14.4 13.3  HCT 43.4 39.0   Recent Labs    04/28/23 0731  CREATININE 0.94   Physical Exam:  Neuro: AAOx4 Resp: Non-labored breathing on RA Abd: Appropriately tender, soft, non-distended. Right flank incisions with moderate bruising. GU: Voiding Spontaneously   Disposition: Home  Discharge medications:  Allergies as of 04/28/2023       Reactions   Prednisolone Other (See Comments)   Insomnia    Penicillins Rash, Other (See Comments)           Medication List     PAUSE taking these medications    apixaban 5 MG Tabs tablet Wait to take this until: May 02, 2023 Commonly known as: Eliquis Take 1 tablet (5 mg total) by mouth 2 (two) times daily.       TAKE these medications    AMBULATORY NON FORMULARY MEDICATION Take 2 capsules by mouth daily. Medication Name: Neuralli probiotic   amLODipine 10 MG tablet Commonly known as: NORVASC Take 10 mg by mouth daily.   atorvastatin 20 MG tablet Commonly known as: LIPITOR Take 20 mg by mouth daily.   carbidopa-levodopa 50-200 MG tablet Commonly known as: SINEMET CR Take 1 tablet by mouth at bedtime. What changed: Another medication with the same name was changed. Make sure you understand how and when to take each.   carbidopa-levodopa 25-100 MG tablet Commonly known as: SINEMET IR Take 1.5  tablets by mouth 3 (three) times daily. 7am/11am/4pm What changed: additional instructions   CoQ10 100 MG Caps Take 100 mg by mouth daily.   cyanocobalamin 1000 MCG tablet Commonly known as: VITAMIN B12 Take 1,000 mcg by mouth daily.   metoprolol tartrate 50 MG tablet Commonly known as: LOPRESSOR TAKE 1 TABLET BY MOUTH TWICE DAILY. MAY TAKE AN EXTRA 1/2 TABLET AS NEEDED FOR BREAKTHROUGH AFIB   olmesartan-hydrochlorothiazide 40-25 MG tablet Commonly known as: BENICAR HCT Take 1 tablet by mouth daily.   oxyCODONE 5 MG immediate release  tablet Commonly known as: Roxicodone Take 1 tablet (5 mg total) by mouth every 6 (six) hours as needed for breakthrough pain (popst-operatively).   potassium chloride SA 20 MEQ tablet Commonly known as: KLOR-CON M Take 2 tablet (40 meq) by mouth in the morning & take 1 tablet (20 meq) by mouth in the evening. What changed:  how much to take how to take this when to take this additional instructions   senna-docusate 8.6-50 MG tablet Commonly known as: Senokot-S Take 1 tablet by mouth 2 (two) times daily. While taking strong pain meds to prevent constipation.   sildenafil 20 MG tablet Commonly known as: REVATIO Take 20-100 mg by mouth daily as needed for erectile dysfunction.   traZODone 100 MG tablet Commonly known as: DESYREL Take 100 mg by mouth at bedtime.   VITAMIN C CR 1500 MG Tbcr Take 1,500 mg by mouth daily.   Vitamin D3 250 MCG (10000 UT) capsule Take 10,000 Units by mouth daily.        Followup:   Follow-up Information     Berneice Heinrich Delbert Phenix., MD Follow up on 05/14/2023.   Specialty: Urology Why: at 1:30 for MD visit and pathology review Contact information: [address] [place] [address] [phone]

## 2023-04-28 NOTE — Progress Notes (Signed)
 Dr Cardell Peach paged. No UOP since foley removal at 0600. Bladder scan performed showing 111 ml. Currently pt is drowsy. BP 89/61. Call back received. Per Dr Cardell Peach, give pt more time to urinate and monitor BP. Page Dr Dalbert Mayotte for further needs.

## 2023-04-28 NOTE — Plan of Care (Signed)
  Problem: Elimination: Goal: Will not experience complications related to bowel motility Outcome: Not Progressing Goal: Will not experience complications related to urinary retention Outcome: Not Progressing   Problem: Pain Managment: Goal: General experience of comfort will improve and/or be controlled Outcome: Not Progressing

## 2023-04-28 NOTE — Op Note (Unsigned)
 NAME: Patrick Gray, Patrick Gray MEDICAL RECORD NO: [id number] ACCOUNT NO: [id number] DATE OF BIRTH: 1955/06/08 FACILITY: [facility] LOCATION: [id number] PHYSICIAN: Sebastian Ache, MD  Operative Report   DATE OF PROCEDURE: 04/27/2023  PREOPERATIVE DIAGNOSIS: Right renal mass and adrenal mass.  PROCEDURES PERFORMED: Robotic assisted laparoscopic right radical nephrectomy with adrenalectomy.  ESTIMATED BLOOD LOSS:  50 mL.  COMPLICATIONS:  None.  SPECIMENS:  Right kidney plus adrenal mass en bloc to pathology.  FINDINGS: 1.  Two artery one vein right renovascular anatomy. 2.  Approximately 2 cm right adrenal nodule.  ASSISTANT:  Zettie Pho, MD  DRAINS:  Foley catheter to straight drain.  INDICATIONS: The patient is a 68 year old man with a history of prior extensive intraabdominal surgery including open cholecystectomy.  He was found to have a fairly significant enhancing nodular right renal mass well over 4 cm with encroachment on the area of the  renal hilum and sinus fat as well as an ipsilateral adrenal nodule.  No obvious distant disease.  Options were discussed including recommend path  or surgical intervention  and we agreed upon path of right radical nephrectomy, resection of the adrenal gland as  well given the size and anatomic complexity of the neoplasm.  Given his history of extensive intraabdominal surgery on the right side, it was felt that a retroperitoneal approach would be most prudent.  He presents for this today.  Informed consent was  obtained and placed in the medical records.  DESCRIPTION OF PROCEDURE:  The patient being, Patrick Gray verified and identified, procedure being right radical nephrectomy with adrenalectomy was confirmed.  Procedure time out was performed.  Intravenous antibiotics were administered.  General  endotracheal anesthesia was induced and the patient placed into right sided up.  He was further fastened to operating table using 3 inch tape over foam padding,  axillary roll, sequential compression devices, bottom leg bent and top leg straight.  Beanbag was deployed. He was  further fastened to operating table using 3-inch tape over foam padding across supraxiphoid chest and his pelvis.  Sterile field was created by clipping and shaving, and then prepping and draping the patient's entire right flank and abdomen using chlorhexadine gluconate. The tip of the 12th rib was identified. An incision was made approximately 13 mm in length just inferior medial to the 12th rib.  Using the surgeon's finger and a drill technique, the skin and subcutaneous tissue and superficial lumbodorsal  musculature was dissected to the level of the lumbodorsal fascia, which was pierced with Kelly forceps, dilated allowing digital exploration of the retroperitoneum, which revealed excellent entry. The lower pole of the kidney was palpated as was the  psoas musculature.  The plane between the psoas musculature and inferior aspect of the kidney was carefully developed using the surgeon's finger as much as possible. Next, the space-maker balloon apparatus was carefully placed into the same retroperitoneal space  behind the kidney under laparoscopic vision carefully inflated in 20 pump increments to 80 pumps. This revealed an excellent development of the peritoneal space. Through the bag, the area of the left iliac vessels, ureter, and presumed hilar  area was visualized.  The balloon apparatus was then taken down and using surgeon's finger and digital guidance.  Additional ports were then placed as follows:  Right lateral port approximately 4 fingerbreadths, medial to the initial entry site, another  lateral port approximately 4 fingerbreadths inferior medial to that port site, being the most medial location and the lateral port in a superior posterior position just  lateral to the psoas musculature in an intercostal location between the 11th and 12th  ribs and a 12-mm Airseal assistant support  just medial towards the psoas musculature from the initial entry site being air seal tight.  The retroperitoneal port was then placed with 30 mL of air in the balloon and a robotic port was piggybacked through  this.  Robot was then docked and passed through the electronic checks.  Initial attention was directed at the development of the peritoneal space.  The psoas musculature was used as a horizontal floor. Using this plane, retroperitoneal spaces were  further developed posterior to the kidney.  The ureter and gonadal vessels were visualized and the ureter and kidney were placed on lateral traction in the kidney on stretch.  The lower accessory vessels were clearly visualized.  This consisted of a  single dominant artery.  This was controlled using Hem-o-lok clips proximal, vascular, scaphoid, distal.  Dissection continued in the medial plane superiorly to the level of the more superior hilar vessels consistent with a single artery and single vein.   The artery was controlled using extra large Hem-o-lok clip proximal, vascular stapler distal and the vein using vascular stapler in a plane approximately 7 to 8 mm lateral to the inferior vena cava.  As the patient does have a known  significant adrenal nodule that is somewhat concerning for possible metastatic disease, dissection proceeded superiorly and dissecting the medial plane of the adrenal gland, which was mostly taken down using columns of bipolar energy.  A dominant  adrenal vein was noted coursing into the posterolateral aspect of the inferior vena cava.  This was doubly clipped and ligated.  This completed the medial plain dissection.  Superior  attachments were taken down off the posterior peritoneum.  The  kidney was then allowed to fall medially. The lateral renal plane and anterior renal planes were developed in a superior to inferior dissection direction.  The ureter was then doubly clipped and ligated.  This freed up the right radical  nephrectomy with  en bloc adrenal specimen.  This was carefully placed into a EndoCatch bag for later retrieval.  Hemostasis was excellent.  Sponge, needles and instruments counts were correct.  We achieved the goals of the extirpative portion of the surgery today.  The  robot was then undocked.  Specimens were retrieved by connecting the previous assistant port site and initial entry port/camera site and removed the specimens and setting aside for permanent pathology.  The lumbar dorsal fascia of the excision site was  reapproximated using figure-of-eight PDS x 5.  Followed by reapproximation of Scarpa's running Vicryl.  All incision sites were infiltrated with dilute lipolyzed Marcaine and closed at the level of the skin using subcuticular Monocryl followed by  Dermabond.  Procedure was then terminated.  The patient tolerated procedure well, no immediate periprocedural complications.  The patient was taken to postanesthesia care unit in stable condition.  Plan for inpatient admission.   [facility] D: 04/27/2023 4:01:41 pm T: 04/28/2023 12:52:00 am  JOB: [id number]/ [id number]

## 2023-04-28 NOTE — Progress Notes (Signed)
 Foley catheter removed at 06:00 AM as per MD  [name]'s order. Patient tolerated well the removal.

## 2023-04-29 LAB — BASIC METABOLIC PANEL
Anion gap: 6 (ref 5–15)
BUN: 19 mg/dL (ref 8–23)
CO2: 28 mmol/L (ref 22–32)
Calcium: 8.8 mg/dL — ABNORMAL LOW (ref 8.9–10.3)
Chloride: 101 mmol/L (ref 98–111)
Creatinine, Ser: 1.24 mg/dL (ref 0.61–1.24)
GFR, Estimated: >60 mL/min (ref >60)
Glucose, Bld: 123 mg/dL — ABNORMAL HIGH (ref 70–99)
Potassium: 3.8 mmol/L (ref 3.5–5.1)
Sodium: 135 mmol/L (ref 135–145)

## 2023-04-29 MED ORDER — TAMSULOSIN HCL 0.4 MG PO CAPS: 0.4000 mg | ORAL_CAPSULE | Freq: Every day | ORAL | 0 refills | Status: AC

## 2023-04-29 MED ORDER — GABAPENTIN 100 MG PO CAPS: 200.0000 mg | ORAL_CAPSULE | Freq: Three times a day (TID) | ORAL | 0 refills | Status: DC

## 2023-04-29 MED ORDER — GABAPENTIN 100 MG PO CAPS
200.0000 mg | ORAL_CAPSULE | Freq: Three times a day (TID) | ORAL | Status: DC
  Administered 2023-04-29 (×2): 200 mg via ORAL
  Filled 2023-04-29 (×2): qty 2

## 2023-04-29 MED ORDER — ACETAMINOPHEN 500 MG PO TABS
1000.0000 mg | ORAL_TABLET | Freq: Four times a day (QID) | ORAL | Status: DC
  Administered 2023-04-29: 1000 mg via ORAL
  Filled 2023-04-29: qty 2

## 2023-04-29 NOTE — Discharge Summary (Addendum)
 Date of admission: 04/27/2023  Date of discharge: 04/29/2023  Admission diagnosis:  Renal mass, right [N28.89]   Discharge diagnosis:  Renal mass, right [N28.89]  Secondary diagnoses:   Active Ambulatory Problems    Diagnosis Date Noted   HTN (hypertension)    Hypercholesterolemia    Deafness in left ear    Atrial fibrillation (HCC)    Hypopotassemia    A-fib (HCC) 11/30/2015   S/P shoulder replacement, left 06/15/2016   Atrial flutter (HCC) 12/19/2016   Myelopathy (HCC) 04/26/2020   BPH (benign prostatic hyperplasia) 07/02/2020   Parkinson's disease without dyskinesia or fluctuating manifestations (HCC) 12/21/2021   Hypercoagulable state due to persistent atrial fibrillation (HCC) 08/02/2022   Resolved Ambulatory Problems    Diagnosis Date Noted   No Resolved Ambulatory Problems   Past Medical History:  Diagnosis Date   Acoustic neuroma (HCC)    Anxiety    Arthritis    Complication of anesthesia    Coronary artery disease    Delusion (HCC)    Dysrhythmia    Foley catheter in place    H/O cardiac radiofrequency ablation    Insomnia    Parkinson's disease (HCC)    Paroxysmal atrial fibrillation (HCC)    Radiculopathy    Tremor    Vitamin B 12 deficiency      History and Physical: For full details, please see admission history and physical. Briefly, Patrick Gray is a 68 y.o. year old patient who was admitted with right renal and adrenal mass.   Hospital Course: Pt admitted and underwent robotic assisted laparoscopic retroperitoneal right radical nephrectomy placement on 04/27/2023. By POD2, they were tolerating a regular diet, pain was controlled with oral medications, and they were deemed appropriate for discharge.   Their course was complicated by: Significant baseline Parkinson's - home meds were restarted, but special care was taken to help avoid significant narcotic usage given pt feeling more confused. Ultimately, pt was started on low dose of gabapentin which  significantly helped pain, and was sent on short 3 day course. Additionally, pt was eval'd by PT who recommended no additional recommendations for discharge.   Finally, pt failed TOV with catheter replaced POD2. We will scheduled short interval follow-up and pt will continue flomax until TOV.   On the day of discharge, the patient was tolerating a regular diet and their pain was well controlled. They were determined to be stable for discharge home  Laboratory values:  Recent Labs    04/27/23 1506 04/28/23 0437  HGB 14.4 13.3  HCT 43.4 39.0   Recent Labs    04/28/23 0731 04/29/23 0812  CREATININE 0.94 1.24   Physical Exam:  Neuro: AAOx4 Resp: Non-labored breathing on RA Abd: Appropriately tender, soft, non-distended. Incisions with moderate bruising, but well healing and closed.  GU: foley in place draining yellow urine  Disposition: Home  Discharge medications:  Allergies as of 04/29/2023       Reactions   Prednisolone Other (See Comments)   Insomnia    Penicillins Rash, Other (See Comments)           Medication List     PAUSE taking these medications    apixaban 5 MG Tabs tablet Wait to take this until: May 02, 2023 Commonly known as: Eliquis Take 1 tablet (5 mg total) by mouth 2 (two) times daily.       TAKE these medications    AMBULATORY NON FORMULARY MEDICATION Take 2 capsules by mouth daily. Medication Name: Neuralli probiotic  amLODipine 10 MG tablet Commonly known as: NORVASC Take 10 mg by mouth daily.   atorvastatin 20 MG tablet Commonly known as: LIPITOR Take 20 mg by mouth daily.   carbidopa-levodopa 50-200 MG tablet Commonly known as: SINEMET CR Take 1 tablet by mouth at bedtime. What changed: Another medication with the same name was changed. Make sure you understand how and when to take each.   carbidopa-levodopa 25-100 MG tablet Commonly known as: SINEMET IR Take 1.5 tablets by mouth 3 (three) times daily. 7am/11am/4pm What  changed: additional instructions   CoQ10 100 MG Caps Take 100 mg by mouth daily.   cyanocobalamin 1000 MCG tablet Commonly known as: VITAMIN B12 Take 1,000 mcg by mouth daily.   gabapentin 100 MG capsule Commonly known as: NEURONTIN Take 2 capsules (200 mg total) by mouth 3 (three) times daily for 3 days.   metoprolol tartrate 50 MG tablet Commonly known as: LOPRESSOR TAKE 1 TABLET BY MOUTH TWICE DAILY. MAY TAKE AN EXTRA 1/2 TABLET AS NEEDED FOR BREAKTHROUGH AFIB   olmesartan-hydrochlorothiazide 40-25 MG tablet Commonly known as: BENICAR HCT Take 1 tablet by mouth daily.   oxyCODONE 5 MG immediate release tablet Commonly known as: Roxicodone Take 1 tablet (5 mg total) by mouth every 6 (six) hours as needed for breakthrough pain (popst-operatively).   potassium chloride SA 20 MEQ tablet Commonly known as: KLOR-CON M Take 2 tablet (40 meq) by mouth in the morning & take 1 tablet (20 meq) by mouth in the evening. What changed:  how much to take how to take this when to take this additional instructions   senna-docusate 8.6-50 MG tablet Commonly known as: Senokot-S Take 1 tablet by mouth 2 (two) times daily. While taking strong pain meds to prevent constipation.   sildenafil 20 MG tablet Commonly known as: REVATIO Take 20-100 mg by mouth daily as needed for erectile dysfunction.   tamsulosin 0.4 MG Caps capsule Commonly known as: FLOMAX Take 1 capsule (0.4 mg total) by mouth daily after supper for 7 days.   traZODone 100 MG tablet Commonly known as: DESYREL Take 100 mg by mouth at bedtime.   VITAMIN C CR 1500 MG Tbcr Take 1,500 mg by mouth daily.   Vitamin D3 250 MCG (10000 UT) capsule Take 10,000 Units by mouth daily.        Followup:   Follow-up Information     Berneice Heinrich Delbert Phenix., MD Follow up on 05/14/2023.   Specialty: Urology Why: at 1:30 for MD visit and pathology review Contact information: [address] [place] [address] [phone]

## 2023-04-29 NOTE — Evaluation (Signed)
 Physical Therapy Evaluation Patient Details Name: Patrick Gray MRN: [id number] DOB: February 17, 1956 Today's Date: 04/29/2023  History of Present Illness  68 yo male s/p R radical nephrectomy 04/27/23. Hx of Afib, Parkinson's, falls, tremor, CAD, cardioversion, L TSA 2018  Clinical Impression  On eval, pt was CGA-Supv level for mobility. He walked ~250 feet with a RW and ascended/descended 5 stairs with use of 1 handrail. Pt presents with general post op weakness, decreased activity tolerance, and impaired gait and balance. Pain with mobility tasks at present. Wife present during session. She feels comfortable managing patient's care at home. Pt and family politely decline PT f/u after discharge. Issued gait belt for wife to use as necessary.         If plan is discharge home, recommend the following: A little help with walking and/or transfers;A little help with bathing/dressing/bathroom;Assistance with cooking/housework;Assist for transportation;Help with stairs or ramp for entrance   Can travel by private vehicle        Equipment Recommendations None recommended by PT  Recommendations for Other Services       Functional Status Assessment Patient has had a recent decline in their functional status and demonstrates the ability to make significant improvements in function in a reasonable and predictable amount of time.     Precautions / Restrictions Precautions Precautions: Fall Restrictions Weight Bearing Restrictions Per Provider Order: No      Mobility  Bed Mobility Overal bed mobility: Needs Assistance Bed Mobility: Rolling, Sidelying to Sit Rolling: Supervision, Used rails Sidelying to sit: Supervision, Used rails       General bed mobility comments: Increased time. Cues provided. Logroll    Transfers Overall transfer level: Needs assistance Equipment used: Rolling walker (2 wheels) Transfers: Sit to/from Stand Sit to Stand: Supervision                 Ambulation/Gait Ambulation/Gait assistance: Supervision Gait Distance (Feet): 250 Feet Assistive device: Rolling walker (2 wheels) Gait Pattern/deviations: Step-through pattern, Decreased stride length, Knee flexed in stance - right, Knee flexed in stance - left, Trunk flexed, Decreased dorsiflexion - left, Decreased dorsiflexion - right       General Gait Details: Cues for safety, RW proximity. Fair gait speed. No overt LOB.  Stairs Stairs: Yes Stairs assistance: Contact guard assist Stair Management: One rail Right, Step to pattern, Forwards Number of Stairs: 5 General stair comments: 2 hands on 1 rail. Cues for safety, technique.  Wheelchair Mobility     Tilt Bed    Modified Rankin (Stroke Patients Only)       Balance Overall balance assessment: Needs assistance         Standing balance support: Reliant on assistive device for balance, During functional activity Standing balance-Leahy Scale: Poor                               Pertinent Vitals/Pain Pain Assessment Pain Assessment: Faces Faces Pain Scale: Hurts little more Pain Location: surgical site, L shoulder Pain Descriptors / Indicators: Aching Pain Intervention(s): Limited activity within patient's tolerance, Monitored during session, Repositioned    Home Living Family/patient expects to be discharged to:: Private residence Living Arrangements: Spouse/significant other Available Help at Discharge: Family;Available 24 hours/day Type of Home: House Home Access: Stairs to enter Entrance Stairs-Rails: Right;Left;Can reach both Entrance Stairs-Number of Steps: 6   Home Layout: One level Home Equipment: Agricultural consultant (2 wheels);Cane - single point  Prior Function               Mobility Comments: uses cane PRN       Extremity/Trunk Assessment   Upper Extremity Assessment Upper Extremity Assessment: Generalized weakness    Lower Extremity Assessment Lower Extremity  Assessment: Generalized weakness    Cervical / Trunk Assessment Cervical / Trunk Assessment: Normal  Communication   Communication Factors Affecting Communication: Hearing impaired    Cognition Arousal: Alert Behavior During Therapy: WFL for tasks assessed/performed   PT - Cognitive impairments: No apparent impairments                         Following commands: Intact       Cueing Cueing Techniques: Verbal cues     General Comments      Exercises     Assessment/Plan    PT Assessment Patient needs continued PT services  PT Problem List Decreased strength;Decreased range of motion;Decreased mobility;Decreased balance;Pain       PT Treatment Interventions DME instruction;Gait training;Functional mobility training;Stair training;Therapeutic activities;Therapeutic exercise;Patient/family education;Balance training    PT Goals (Current goals can be found in the Care Plan section)  Acute Rehab PT Goals Patient Stated Goal: regain PLOF/independe. home PT Goal Formulation: With patient/family Time For Goal Achievement: 05/13/23 Potential to Achieve Goals: Good    Frequency Min 2X/week     Co-evaluation               AM-PAC PT "6 Clicks" Mobility  Outcome Measure Help needed turning from your back to your side while in a flat bed without using bedrails?: None Help needed moving from lying on your back to sitting on the side of a flat bed without using bedrails?: None Help needed moving to and from a bed to a chair (including a wheelchair)?: None Help needed standing up from a chair using your arms (e.g., wheelchair or bedside chair)?: None Help needed to walk in hospital room?: A Little Help needed climbing 3-5 steps with a railing? : A Little 6 Click Score: 22    End of Session Equipment Utilized During Treatment: Gait belt Activity Tolerance: Patient tolerated treatment well Patient left: in chair;with call bell/phone within reach;with  family/visitor present   PT Visit Diagnosis: Difficulty in walking, not elsewhere classified (R26.2);Pain    Time: 1205-1228 PT Time Calculation (min) (ACUTE ONLY): 23 min   Charges:   PT Evaluation $PT Eval Low Complexity: 1 Low PT Treatments $Gait Training: 8-22 mins PT General Charges $$ ACUTE PT VISIT: 1 Visit           Faye Ramsay, PT Acute Rehabilitation  Office: [phone]

## 2023-04-29 NOTE — Plan of Care (Signed)

## 2023-04-29 NOTE — Progress Notes (Signed)
 Patient was encouraged to get up to void and he told the RN and CNA back  "it's none of your business." POC continue.

## 2023-04-29 NOTE — Progress Notes (Signed)
 Foley care instructions and extra supplies given to patient and spouse. Both verbalized understanding.

## 2023-04-29 NOTE — Progress Notes (Addendum)
 2 Days Post-Op  Subjective: Patient ultimately stayed yesterday evening due to slow trial of void.  Doing well this morning, voided approximately 250 cc.  Pain well-controlled, has ambulated.  Also with mild desaturation to 89% overnight, placed on low 2 L nasal cannula, weaned this morning on rounds.  Objective: Vital signs in last 24 hours: Temp:  [97.7 F (36.5 C)-98 F (36.7 C)] 97.7 F (36.5 C) (03/09 0425) Pulse Rate:  [67-105] 85 (03/09 0425) Resp:  [14-16] 15 (03/09 0425) BP: (89-131)/(61-83) 117/67 (03/09 0425) SpO2:  [89 %-97 %] 97 % (03/09 0425)  Intake/Output from previous day: 03/08 0701 - 03/09 0700 In: 2183.1 [P.O.:1440; I.V.:529.7; IV Piggyback:213.4] Out: 250 [Urine:250] Intake/Output this shift: No intake/output data recorded.  Physical Exam:  General: Alert and oriented. CV: RRR Lungs: Clear bilaterally. GI: Soft, Nondistended. Incisions: Clean and dry. Urine: Clear Extremities: Nontender, no erythema, no edema.  Lab Results: Recent Labs    04/27/23 1506 04/28/23 0437  HGB 14.4 13.3  HCT 43.4 39.0          Recent Labs    04/28/23 0731  CREATININE 0.94           No results found for this or any previous visit (from the past 24 hours).   Assessment/Plan: POD# 2 s/p robotic partial nephrectomy.  1) Ambulate, Incentive spirometry 2) Advance diet as tolerated 3) Transition to oral pain medication 4) we will bladder scan this morning, may require indwelling Foley catheter at discharge  Patrick Lofts, MD Resident Physician [facility]    LOS: 2 days   Patrick Gray 04/29/2023, 8:39 AM   C/o abdominal incisional pain, controlled. Slow to void after foley removed yesterday, ultimately voided with PVR >338ml. Ambulated. Mild desaturation to 89% and placed on 2LNC weaned this AM.  Afebrile  Abd soft, ND, ATTP   -Pain control prn -Diet as tolerated -Given elevated PVR and voiding <50:50 output with some suprapubic  pressure, recommend foley replacement and repeat TOV outpatient -Patient and family requesting PT eval given slower mobility -Hopefully discharge home this PM after PT evaluation  Patrick R. Joseff Luckman MD [facility]  Pager: [phone]

## 2023-04-30 ENCOUNTER — Encounter (HOSPITAL_COMMUNITY): Payer: Self-pay | Admitting: Urology

## 2023-04-30 NOTE — Anesthesia Postprocedure Evaluation (Signed)
 Anesthesia Post Note  Patient: Patrick Gray  Procedure(s) Performed: XI RIGHT RETROPERITONEAL ROBOTIC ASSISTED LAPAROSCOPIC RADICAL NEPHRECTOMY (Right)     Patient location during evaluation: PACU Anesthesia Type: General Level of consciousness: awake and patient cooperative Pain management: pain level controlled Vital Signs Assessment: post-procedure vital signs reviewed and stable Respiratory status: spontaneous breathing, nonlabored ventilation and respiratory function stable Cardiovascular status: stable and blood pressure returned to baseline Anesthetic complications: yes   No notable events documented.  Last Vitals:  Vitals:   04/29/23 0425 04/29/23 1046  BP: 117/67 (!) 142/88  Pulse: 85   Resp: 15   Temp: 36.5 C   SpO2: 97%     Last Pain:  Vitals:   04/29/23 0728  TempSrc:   PainSc: 5                  Beryle Lathe

## 2023-04-30 NOTE — Telephone Encounter (Signed)
 Marland Kitchen

## 2023-05-01 LAB — SURGICAL PATHOLOGY

## 2023-05-03 DIAGNOSIS — R338 Other retention of urine: Secondary | ICD-10-CM | POA: Diagnosis not present

## 2023-05-07 DIAGNOSIS — I4891 Unspecified atrial fibrillation: Secondary | ICD-10-CM | POA: Diagnosis not present

## 2023-05-07 DIAGNOSIS — G20A1 Parkinson's disease without dyskinesia, without mention of fluctuations: Secondary | ICD-10-CM | POA: Diagnosis not present

## 2023-05-07 DIAGNOSIS — I10 Essential (primary) hypertension: Secondary | ICD-10-CM | POA: Diagnosis not present

## 2023-05-07 DIAGNOSIS — R0683 Snoring: Secondary | ICD-10-CM | POA: Diagnosis not present

## 2023-05-07 DIAGNOSIS — R5383 Other fatigue: Secondary | ICD-10-CM | POA: Diagnosis not present

## 2023-05-07 DIAGNOSIS — M542 Cervicalgia: Secondary | ICD-10-CM | POA: Diagnosis not present

## 2023-05-07 DIAGNOSIS — R7301 Impaired fasting glucose: Secondary | ICD-10-CM | POA: Diagnosis not present

## 2023-05-07 DIAGNOSIS — Z905 Acquired absence of kidney: Secondary | ICD-10-CM | POA: Diagnosis not present

## 2023-05-07 DIAGNOSIS — N2889 Other specified disorders of kidney and ureter: Secondary | ICD-10-CM | POA: Diagnosis not present

## 2023-05-07 DIAGNOSIS — E782 Mixed hyperlipidemia: Secondary | ICD-10-CM | POA: Diagnosis not present

## 2023-05-14 DIAGNOSIS — Z905 Acquired absence of kidney: Secondary | ICD-10-CM | POA: Diagnosis not present

## 2023-05-24 DIAGNOSIS — Z905 Acquired absence of kidney: Secondary | ICD-10-CM | POA: Diagnosis not present

## 2023-05-24 DIAGNOSIS — G20A1 Parkinson's disease without dyskinesia, without mention of fluctuations: Secondary | ICD-10-CM | POA: Diagnosis not present

## 2023-05-24 DIAGNOSIS — R634 Abnormal weight loss: Secondary | ICD-10-CM | POA: Diagnosis not present

## 2023-05-24 DIAGNOSIS — D333 Benign neoplasm of cranial nerves: Secondary | ICD-10-CM | POA: Diagnosis not present

## 2023-05-24 DIAGNOSIS — G478 Other sleep disorders: Secondary | ICD-10-CM | POA: Diagnosis not present

## 2023-05-24 DIAGNOSIS — G2581 Restless legs syndrome: Secondary | ICD-10-CM | POA: Diagnosis not present

## 2023-05-24 DIAGNOSIS — I4891 Unspecified atrial fibrillation: Secondary | ICD-10-CM | POA: Diagnosis not present

## 2023-05-24 DIAGNOSIS — R0681 Apnea, not elsewhere classified: Secondary | ICD-10-CM | POA: Diagnosis not present

## 2023-05-24 DIAGNOSIS — G4700 Insomnia, unspecified: Secondary | ICD-10-CM | POA: Diagnosis not present

## 2023-05-28 DIAGNOSIS — I10 Essential (primary) hypertension: Secondary | ICD-10-CM | POA: Diagnosis not present

## 2023-05-28 DIAGNOSIS — E782 Mixed hyperlipidemia: Secondary | ICD-10-CM | POA: Diagnosis not present

## 2023-05-28 DIAGNOSIS — G4700 Insomnia, unspecified: Secondary | ICD-10-CM | POA: Diagnosis not present

## 2023-05-28 DIAGNOSIS — G20A1 Parkinson's disease without dyskinesia, without mention of fluctuations: Secondary | ICD-10-CM | POA: Diagnosis not present

## 2023-05-28 DIAGNOSIS — I4891 Unspecified atrial fibrillation: Secondary | ICD-10-CM | POA: Diagnosis not present

## 2023-05-28 DIAGNOSIS — G2581 Restless legs syndrome: Secondary | ICD-10-CM | POA: Diagnosis not present

## 2023-05-28 DIAGNOSIS — Z905 Acquired absence of kidney: Secondary | ICD-10-CM | POA: Diagnosis not present

## 2023-05-28 DIAGNOSIS — M2550 Pain in unspecified joint: Secondary | ICD-10-CM | POA: Diagnosis not present

## 2023-05-28 DIAGNOSIS — F411 Generalized anxiety disorder: Secondary | ICD-10-CM | POA: Diagnosis not present

## 2023-06-05 ENCOUNTER — Ambulatory Visit: Payer: Medicare PPO | Admitting: Physical Therapy

## 2023-06-05 ENCOUNTER — Ambulatory Visit: Payer: Medicare PPO | Admitting: Occupational Therapy

## 2023-06-11 DIAGNOSIS — H524 Presbyopia: Secondary | ICD-10-CM | POA: Diagnosis not present

## 2023-06-11 DIAGNOSIS — H5203 Hypermetropia, bilateral: Secondary | ICD-10-CM | POA: Diagnosis not present

## 2023-06-11 DIAGNOSIS — H02432 Paralytic ptosis of left eyelid: Secondary | ICD-10-CM | POA: Diagnosis not present

## 2023-06-11 DIAGNOSIS — H52213 Irregular astigmatism, bilateral: Secondary | ICD-10-CM | POA: Diagnosis not present

## 2023-06-11 DIAGNOSIS — H2513 Age-related nuclear cataract, bilateral: Secondary | ICD-10-CM | POA: Diagnosis not present

## 2023-06-14 DIAGNOSIS — G20A1 Parkinson's disease without dyskinesia, without mention of fluctuations: Secondary | ICD-10-CM | POA: Diagnosis not present

## 2023-06-14 DIAGNOSIS — I4891 Unspecified atrial fibrillation: Secondary | ICD-10-CM | POA: Diagnosis not present

## 2023-06-14 DIAGNOSIS — R0683 Snoring: Secondary | ICD-10-CM | POA: Diagnosis not present

## 2023-06-14 DIAGNOSIS — G2579 Other drug induced movement disorders: Secondary | ICD-10-CM | POA: Diagnosis not present

## 2023-06-14 DIAGNOSIS — G2581 Restless legs syndrome: Secondary | ICD-10-CM | POA: Diagnosis not present

## 2023-06-14 DIAGNOSIS — F411 Generalized anxiety disorder: Secondary | ICD-10-CM | POA: Diagnosis not present

## 2023-06-18 ENCOUNTER — Other Ambulatory Visit: Payer: Self-pay

## 2023-06-18 ENCOUNTER — Emergency Department (HOSPITAL_BASED_OUTPATIENT_CLINIC_OR_DEPARTMENT_OTHER)
Admission: EM | Admit: 2023-06-18 | Discharge: 2023-06-18 | Disposition: A | Discharge status: Home / Self Care | Attending: Emergency Medicine | Admitting: Emergency Medicine

## 2023-06-18 ENCOUNTER — Encounter (HOSPITAL_BASED_OUTPATIENT_CLINIC_OR_DEPARTMENT_OTHER): Payer: Self-pay

## 2023-06-18 ENCOUNTER — Emergency Department (HOSPITAL_BASED_OUTPATIENT_CLINIC_OR_DEPARTMENT_OTHER)

## 2023-06-18 DIAGNOSIS — Z8659 Personal history of other mental and behavioral disorders: Secondary | ICD-10-CM | POA: Diagnosis not present

## 2023-06-18 DIAGNOSIS — R4182 Altered mental status, unspecified: Secondary | ICD-10-CM

## 2023-06-18 DIAGNOSIS — E860 Dehydration: Secondary | ICD-10-CM

## 2023-06-18 DIAGNOSIS — Z7901 Long term (current) use of anticoagulants: Secondary | ICD-10-CM | POA: Diagnosis not present

## 2023-06-18 DIAGNOSIS — R531 Weakness: Secondary | ICD-10-CM

## 2023-06-18 DIAGNOSIS — F419 Anxiety disorder, unspecified: Secondary | ICD-10-CM

## 2023-06-18 DIAGNOSIS — Z79899 Other long term (current) drug therapy: Secondary | ICD-10-CM

## 2023-06-18 DIAGNOSIS — I6782 Cerebral ischemia: Secondary | ICD-10-CM | POA: Diagnosis not present

## 2023-06-18 LAB — CBC
HCT: 43.3 % (ref 39.0–52.0)
Hemoglobin: 15.3 g/dL (ref 13.0–17.0)
MCH: 31.9 pg (ref 26.0–34.0)
MCHC: 35.3 g/dL (ref 30.0–36.0)
MCV: 90.4 fL (ref 80.0–100.0)
Platelets: 147 10*3/uL — ABNORMAL LOW (ref 150–400)
RBC: 4.79 MIL/uL (ref 4.22–5.81)
RDW: 13.3 % (ref 11.5–15.5)
WBC: 4.5 10*3/uL (ref 4.0–10.5)
nRBC: 0 % (ref 0.0–0.2)

## 2023-06-18 LAB — COMPREHENSIVE METABOLIC PANEL WITH GFR
ALT: 37 U/L (ref 0–44)
ALT: 39 U/L (ref 0–44)
AST: 53 U/L — ABNORMAL HIGH (ref 15–41)
AST: 53 U/L — ABNORMAL HIGH (ref 15–41)
Albumin: 4.8 g/dL (ref 3.5–5.0)
Albumin: 4.9 g/dL (ref 3.5–5.0)
Alkaline Phosphatase: 82 U/L (ref 38–126)
Alkaline Phosphatase: 83 U/L (ref 38–126)
Anion gap: 10 (ref 5–15)
Anion gap: 12 (ref 5–15)
BUN: 28 mg/dL — ABNORMAL HIGH (ref 8–23)
BUN: 28 mg/dL — ABNORMAL HIGH (ref 8–23)
CO2: 23 mmol/L (ref 22–32)
CO2: 23 mmol/L (ref 22–32)
Calcium: 10.5 mg/dL — ABNORMAL HIGH (ref 8.9–10.3)
Calcium: 10.6 mg/dL — ABNORMAL HIGH (ref 8.9–10.3)
Chloride: 102 mmol/L (ref 98–111)
Chloride: 102 mmol/L (ref 98–111)
Creatinine, Ser: 1.13 mg/dL (ref 0.61–1.24)
Creatinine, Ser: 1.12 mg/dL (ref 0.61–1.24)
GFR, Estimated: >60 mL/min (ref >60)
GFR, Estimated: >60 mL/min (ref >60)
Glucose, Bld: 98 mg/dL (ref 70–99)
Glucose, Bld: 97 mg/dL (ref 70–99)
Potassium: 4.7 mmol/L (ref 3.5–5.1)
Potassium: 4.6 mmol/L (ref 3.5–5.1)
Sodium: 136 mmol/L (ref 135–145)
Sodium: 136 mmol/L (ref 135–145)
Total Bilirubin: 0.7 mg/dL (ref 0.0–1.2)
Total Bilirubin: 0.7 mg/dL (ref 0.0–1.2)
Total Protein: 6.8 g/dL (ref 6.5–8.1)
Total Protein: 6.9 g/dL (ref 6.5–8.1)

## 2023-06-18 LAB — CBC WITH DIFFERENTIAL/PLATELET
Abs Immature Granulocytes: 0.01 10*3/uL (ref 0.00–0.07)
Basophils Absolute: 0 10*3/uL (ref 0.0–0.1)
Basophils Relative: 0 %
Eosinophils Absolute: 0.2 10*3/uL (ref 0.0–0.5)
Eosinophils Relative: 4 %
HCT: 43 % (ref 39.0–52.0)
Hemoglobin: 15.4 g/dL (ref 13.0–17.0)
Immature Granulocytes: 0 %
Lymphocytes Relative: 35 %
Lymphs Abs: 1.6 10*3/uL (ref 0.7–4.0)
MCH: 32.4 pg (ref 26.0–34.0)
MCHC: 35.8 g/dL (ref 30.0–36.0)
MCV: 90.3 fL (ref 80.0–100.0)
Monocytes Absolute: 0.6 10*3/uL (ref 0.1–1.0)
Monocytes Relative: 14 %
Neutro Abs: 2.1 10*3/uL (ref 1.7–7.7)
Neutrophils Relative %: 47 %
Platelets: 148 10*3/uL — ABNORMAL LOW (ref 150–400)
RBC: 4.76 MIL/uL (ref 4.22–5.81)
RDW: 13.2 % (ref 11.5–15.5)
WBC: 4.5 10*3/uL (ref 4.0–10.5)
nRBC: 0 % (ref 0.0–0.2)

## 2023-06-18 LAB — T4, FREE: Free T4: 0.79 ng/dL (ref 0.61–1.12)

## 2023-06-18 LAB — URINALYSIS, ROUTINE W REFLEX MICROSCOPIC
Bilirubin Urine: NEGATIVE
Glucose, UA: NEGATIVE mg/dL
Hgb urine dipstick: NEGATIVE
Ketones, ur: NEGATIVE mg/dL
Leukocytes,Ua: NEGATIVE
Nitrite: NEGATIVE
Protein, ur: NEGATIVE mg/dL
Specific Gravity, Urine: 1.014 (ref 1.005–1.030)
pH: 6 (ref 5.0–8.0)

## 2023-06-18 LAB — SEDIMENTATION RATE: Sed Rate: 3 mm/h (ref 0–16)

## 2023-06-18 LAB — TSH: TSH: 1.61 u[IU]/mL (ref 0.350–4.500)

## 2023-06-18 LAB — FOLATE: Folate: 12.8 ng/mL (ref >5.9)

## 2023-06-18 LAB — VITAMIN B12: Vitamin B-12: 855 pg/mL (ref 180–914)

## 2023-06-18 MED ORDER — LACTATED RINGERS IV BOLUS
1000.0000 mL | Freq: Once | INTRAVENOUS | Status: AC
  Administered 2023-06-18: 1000 mL via INTRAVENOUS

## 2023-06-18 NOTE — ED Notes (Signed)
 Pt alert and oriented X 4 at the time of discharge. RR even and unlabored. No acute distress noted. Pt verbalized understanding of discharge instructions as discussed. Pt ambulatory to lobby at time of discharge.

## 2023-06-18 NOTE — ED Provider Notes (Signed)
 [place] [facility]GENCY DEPARTMENT AT [facility] Provider Note   CSN: [id number] Arrival date & time: 06/18/23  1203     History  Chief Complaint  Patient presents with   Medication Reaction    Patrick Gray is a 68 y.o. male.  Pt with periods of pacing, restlessness, anxiety, and other periods where seems tired/lethargic. Symptoms progressive in past few months. Spouse indicates was very stressed/worried about have nephrectomy surgery (indicates benign lesion) in March. Indicates ~ 20 lb wt decrease around that period. Indicates intermittent problems w balance, has been using cane or walker. Has seen neurology for similar issues, had MRI in Feb showing known acoustic neuroma - no acute change noted then. Recently saw [facility]/sleep lab, indicates some concern about possible serotonin syndrome (spouse indicates due to agitated behavior, restlessness, per report, no major hemodynamic/vitals signs issues) and indicates was tapered down/off trazodone  and zoloft over a course of 1-2 weeks, and was given new rx clonazepam to help symptoms - those changes made last week. No other change in meds.  Pt limited historian - level 5 caveat - no specific physical c/o or pain from patient. No hx trauma/fall. No hx fever/chills.   The history is provided by the patient, medical records and the spouse. The history is limited by the condition of the patient.       Home Medications Prior to Admission medications   Medication Sig Start Date End Date Taking? Authorizing Provider  AMBULATORY NON FORMULARY MEDICATION Take 2 capsules by mouth daily. Medication Name: Neuralli probiotic    [provider]  amLODipine  (NORVASC ) 10 MG tablet Take 10 mg by mouth daily.    [provider]  apixaban  (ELIQUIS ) 5 MG TABS tablet Take 1 tablet (5 mg total) by mouth 2 (two) times daily. 01/29/23   Hugh Madura, MD  Ascorbic Acid (VITAMIN C CR) 1500 MG TBCR Take 1,500 mg by mouth daily.    [provider]  atorvastatin  (LIPITOR) 20 MG tablet Take 20 mg by mouth daily. 10/30/13   [provider]  carbidopa -levodopa  (SINEMET  CR) 50-200 MG tablet Take 1 tablet by mouth at bedtime. 03/27/23   Tat, Von Grumbling, DO  carbidopa -levodopa  (SINEMET  IR) 25-100 MG tablet Take 1.5 tablets by mouth 3 (three) times daily. 7am/11am/4pm Patient taking differently: Take 1.5 tablets by mouth 3 (three) times daily. 7am/11am/4pm, May take a 0.5 mg tablet twice daily as needed for restlessness 03/27/23   Tat, Rebecca S, DO  Cholecalciferol  (VITAMIN D3) 250 MCG (10000 UT) capsule Take 10,000 Units by mouth daily.    [provider]  Coenzyme Q10 (COQ10) 100 MG CAPS Take 100 mg by mouth daily.    [provider]  cyanocobalamin  (VITAMIN B12) 1000 MCG tablet Take 1,000 mcg by mouth daily.    [provider]  gabapentin  (NEURONTIN ) 100 MG capsule Take 2 capsules (200 mg total) by mouth 3 (three) times daily for 3 days. 04/29/23 05/02/23  Lynae Sandifer, MD  metoprolol  tartrate (LOPRESSOR ) 50 MG tablet TAKE 1 TABLET BY MOUTH TWICE DAILY. MAY TAKE AN EXTRA 1/2 TABLET AS NEEDED FOR BREAKTHROUGH AFIB 05/31/22   Fenton, Clint R, PA  olmesartan-hydrochlorothiazide  (BENICAR HCT) 40-25 MG tablet Take 1 tablet by mouth daily.    [provider]  oxyCODONE  (ROXICODONE ) 5 MG immediate release tablet Take 1 tablet (5 mg total) by mouth every 6 (six) hours as needed for breakthrough pain (popst-operatively). 04/27/23 04/26/24  Melody Spurling., MD  potassium chloride  SA (  KLOR-CON  M) 20 MEQ tablet Take 2 tablet (40 meq) by mouth in the morning & take 1 tablet (20 meq) by mouth in the evening. Patient taking differently: Take 60 mEq by mouth daily. 11/01/21   Asa Lauth, NP  senna-docusate (SENOKOT-S) 8.6-50 MG tablet Take 1 tablet by mouth 2 (two) times daily. While taking strong pain meds to prevent constipation. 04/27/23   Melody Spurling., MD  sildenafil  (REVATIO ) 20 MG tablet Take  20-100 mg by mouth daily as needed for erectile dysfunction. 08/09/17   [provider]  traZODone  (DESYREL ) 100 MG tablet Take 100 mg by mouth at bedtime.    [provider]      Allergies    Prednisolone and Penicillins    Review of Systems   Review of Systems  Unable to perform ROS: Mental status change  Constitutional:  Negative for fever.    Physical Exam Updated Vital Signs BP 129/77   Pulse 79   Temp 98.4 F (36.9 C)   Resp 13   Ht 1.803 m (5\' 11" )   Wt 90.7 kg   SpO2 99%   BMI 27.89 kg/m  Physical Exam Vitals and nursing note reviewed.  Constitutional:      Appearance: Normal appearance. He is well-developed.  HENT:     Head: Atraumatic.     Comments: No sinus, mastoid or temporal tenderness.     Right Ear: Tympanic membrane normal.     Left Ear: Tympanic membrane normal.     Nose: Nose normal.     Mouth/Throat:     Mouth: Mucous membranes are moist.     Pharynx: Oropharynx is clear.  Eyes:     General: No scleral icterus.    Conjunctiva/sclera: Conjunctivae normal.     Pupils: Pupils are equal, round, and reactive to light.  Neck:     Vascular: No carotid bruit.     Trachea: No tracheal deviation.     Comments: Trachea midline. Thyroid not grossly enlarged or tender. No stiffness or rigidity.  Cardiovascular:     Rate and Rhythm: Normal rate and regular rhythm.     Pulses: Normal pulses.     Heart sounds: Normal heart sounds. No murmur heard.    No friction rub. No gallop.  Pulmonary:     Effort: Pulmonary effort is normal. No accessory muscle usage or respiratory distress.     Breath sounds: Normal breath sounds.  Abdominal:     General: Bowel sounds are normal. There is no distension.     Palpations: Abdomen is soft.     Tenderness: There is no abdominal tenderness. There is no guarding.  Genitourinary:    Comments: No cva tenderness. Grossly normal external gu exam.  Musculoskeletal:        General: No swelling or tenderness.      Cervical back: Normal range of motion and neck supple. No rigidity or tenderness.     Right lower leg: No edema.     Left lower leg: No edema.  Lymphadenopathy:     Cervical: No cervical adenopathy.  Skin:    General: Skin is warm and dry.     Findings: No rash.  Neurological:     Mental Status: He is alert.     Comments: Pt appears sleepy/tired, is mildly lethargic. Does follow commands. Participates very little with history. No gross tremor or shakes. Motor/sens grossly intact bil, stre 5/5.   Psychiatric:        Mood and  Affect: Mood normal.     ED Results / Procedures / Treatments   Labs (all labs ordered are listed, but only abnormal results are displayed) Results for orders placed or performed during the hospital encounter of 06/18/23  Comprehensive metabolic panel   Collection Time: 06/18/23 12:40 PM  Result Value Ref Range   Sodium 136 135 - 145 mmol/L   Potassium 4.7 3.5 - 5.1 mmol/L   Chloride 102 98 - 111 mmol/L   CO2 23 22 - 32 mmol/L   Glucose, Bld 98 70 - 99 mg/dL   BUN 28 (H) 8 - 23 mg/dL   Creatinine, Ser 3.15 0.61 - 1.24 mg/dL   Calcium  10.5 (H) 8.9 - 10.3 mg/dL   Total Protein 6.8 6.5 - 8.1 g/dL   Albumin 4.8 3.5 - 5.0 g/dL   AST 53 (H) 15 - 41 U/L   ALT 37 0 - 44 U/L   Alkaline Phosphatase 82 38 - 126 U/L   Total Bilirubin 0.7 0.0 - 1.2 mg/dL   GFR, Estimated >17 >61 mL/min   Anion gap 10 5 - 15  CBC with Differential   Collection Time: 06/18/23 12:40 PM  Result Value Ref Range   WBC 4.5 4.0 - 10.5 K/uL   RBC 4.76 4.22 - 5.81 MIL/uL   Hemoglobin 15.4 13.0 - 17.0 g/dL   HCT 60.7 37.1 - 06.2 %   MCV 90.3 80.0 - 100.0 fL   MCH 32.4 26.0 - 34.0 pg   MCHC 35.8 30.0 - 36.0 g/dL   RDW 69.4 85.4 - 62.7 %   Platelets 148 (L) 150 - 400 K/uL   nRBC 0.0 0.0 - 0.2 %   Neutrophils Relative % 47 %   Neutro Abs 2.1 1.7 - 7.7 K/uL   Lymphocytes Relative 35 %   Lymphs Abs 1.6 0.7 - 4.0 K/uL   Monocytes Relative 14 %   Monocytes Absolute 0.6 0.1 - 1.0  K/uL   Eosinophils Relative 4 %   Eosinophils Absolute 0.2 0.0 - 0.5 K/uL   Basophils Relative 0 %   Basophils Absolute 0.0 0.0 - 0.1 K/uL   Immature Granulocytes 0 %   Abs Immature Granulocytes 0.01 0.00 - 0.07 K/uL  TSH   Collection Time: 06/18/23  1:11 PM  Result Value Ref Range   TSH 1.610 0.350 - 4.500 uIU/mL  CBC   Collection Time: 06/18/23  1:29 PM  Result Value Ref Range   WBC 4.5 4.0 - 10.5 K/uL   RBC 4.79 4.22 - 5.81 MIL/uL   Hemoglobin 15.3 13.0 - 17.0 g/dL   HCT 03.5 00.9 - 38.1 %   MCV 90.4 80.0 - 100.0 fL   MCH 31.9 26.0 - 34.0 pg   MCHC 35.3 30.0 - 36.0 g/dL   RDW 82.9 93.7 - 16.9 %   Platelets 147 (L) 150 - 400 K/uL   nRBC 0.0 0.0 - 0.2 %  Comprehensive metabolic panel with GFR   Collection Time: 06/18/23  1:29 PM  Result Value Ref Range   Sodium 136 135 - 145 mmol/L   Potassium 4.6 3.5 - 5.1 mmol/L   Chloride 102 98 - 111 mmol/L   CO2 23 22 - 32 mmol/L   Glucose, Bld 97 70 - 99 mg/dL   BUN 28 (H) 8 - 23 mg/dL   Creatinine, Ser 6.78 0.61 - 1.24 mg/dL   Calcium  10.6 (H) 8.9 - 10.3 mg/dL   Total Protein 6.9 6.5 - 8.1 g/dL   Albumin 4.9 3.5 -  5.0 g/dL   AST 53 (H) 15 - 41 U/L   ALT 39 0 - 44 U/L   Alkaline Phosphatase 83 38 - 126 U/L   Total Bilirubin 0.7 0.0 - 1.2 mg/dL   GFR, Estimated >45 >40 mL/min   Anion gap 12 5 - 15  Sedimentation rate   Collection Time: 06/18/23  1:29 PM  Result Value Ref Range   Sed Rate 3 0 - 16 mm/hr  Urinalysis, Routine w reflex microscopic -Urine, Clean Catch   Collection Time: 06/18/23  1:56 PM  Result Value Ref Range   Color, Urine COLORLESS (A) YELLOW   APPearance CLEAR CLEAR   Specific Gravity, Urine 1.014 1.005 - 1.030   pH 6.0 5.0 - 8.0   Glucose, UA NEGATIVE NEGATIVE mg/dL   Hgb urine dipstick NEGATIVE NEGATIVE   Bilirubin Urine NEGATIVE NEGATIVE   Ketones, ur NEGATIVE NEGATIVE mg/dL   Protein, ur NEGATIVE NEGATIVE mg/dL   Nitrite NEGATIVE NEGATIVE   Leukocytes,Ua NEGATIVE NEGATIVE      EKG EKG  Interpretation Date/Time:  Monday June 18 2023 12:40:32 EDT Ventricular Rate:  68 PR Interval:    QRS Duration:  88 QT Interval:  403 QTC Calculation: 429 R Axis:   -63  Text Interpretation: Atrial fibrillation Non-specific ST-t changes Confirmed by Guadalupe Lee ([id number]) on 06/18/2023 12:55:18 PM  Radiology CT Head Wo Contrast Result Date: 06/18/2023 CLINICAL DATA:  Mental status change, unknown cause. EXAM: CT HEAD WITHOUT CONTRAST TECHNIQUE: Contiguous axial images were obtained from the base of the skull through the vertex without intravenous contrast. RADIATION DOSE REDUCTION: This exam was performed according to the departmental dose-optimization program which includes automated exposure control, adjustment of the mA and/or kV according to patient size and/or use of iterative reconstruction technique. COMPARISON:  Head CT 10/05/2022 and MRI 04/10/2023 FINDINGS: Brain: There is no evidence of an acute infarct, intracranial hemorrhage, mass, midline shift, or extra-axial fluid collection. Mild patchy hypodensities in the cerebral Grahn matter bilaterally are similar to the prior CT and are nonspecific but compatible with chronic small vessel ischemic disease. Cerebral volume is normal. The ventricles are normal in size. A 2 cm mass involving the left cerebellopontine angle cistern and IAC is grossly similar in size to the prior MRI. There is mild encephalomalacia in the left cerebellar hemisphere subjacent to a craniectomy. Vascular: No hyperdense vessel. Skull: Left retrosigmoid craniectomy. Sinuses/Orbits: Visualized paranasal sinuses and mastoid air cells are clear. Unremarkable orbits. Other: None. IMPRESSION: 1. No evidence of acute intracranial abnormality. 2. Mild chronic small vessel ischemic disease. 3. Known left-sided vestibular schwannoma. Electronically Signed   By: Aundra Lee M.D.   On: 06/18/2023 14:13    Procedures Procedures    Medications Ordered in ED Medications   lactated ringers  bolus 1,000 mL (1,000 mLs Intravenous New Bag/Given 06/18/23 1332)    ED Course/ Medical Decision Making/ A&P Clinical Course as of 06/18/23 1521  Mon Jun 18, 2023  1510 Stable Psychiatric eval. Anxiety and depression. Meds changed recently rapidly off his psych meds.  Klonopin addition. Manic behavior. Voluntary. [CC]    Clinical Course User Index [CC] Onetha Bile, MD                                 Medical Decision Making Problems Addressed: Acute alteration in mental status: acute illness or injury with systemic symptoms that poses a threat to life or bodily functions Anxiety: chronic illness or  injury Dehydration: acute illness or injury with systemic symptoms Generalized weakness: acute illness or injury with systemic symptoms that poses a threat to life or bodily functions History of depression: chronic illness or injury Medication therapy changed: acute illness or injury    Details: Recent acute on chronic  Amount and/or Complexity of Data Reviewed Independent Historian: spouse    Details: hx External Data Reviewed: notes. Labs: ordered. Decision-making details documented in ED Course. Radiology: ordered and independent interpretation performed. Decision-making details documented in ED Course. ECG/medicine tests: ordered and independent interpretation performed. Decision-making details documented in ED Course.  Risk Prescription drug management. Decision regarding hospitalization.   Iv ns. Continuous pulse ox and cardiac monitoring. Labs ordered/sent. Imaging ordered.   Differential diagnosis includes dehydration, aki, uti, acute intracranial process, medication changes/adverse effects, etc. Dispo decision including potential need for admission considered - will get labs and imaging and reassess.   Reviewed nursing notes and prior charts for additional history. External reports reviewed. Additional history from: spouse.   Pt recently taken off  trazodone  and zoloft, and placed on clonazepam ? Whether current presentation/symptoms are related.  It appears pt with recurrent problems with various symptoms, anxiety, depression, adjustment reaction, etc in past months with overall failure to thrive.  (Recent and current symptoms potentially exacerbated by chronic stress/recent medical issues, and some lack of clarity of who is seeing and assessing patient on regular basis, whether single physician is managing meds, and degree of input from neurology and behavioral health in managing various meds)  Cardiac monitor: sinus rhythm, rate 80.  Bp soft, ?poor po intake. No fever/chills. LR bolus.   Labs reviewed/interpreted by me - wbc and hgb normal. K normal. Bun and ca sl high ?dehydration, ivf bolus. Tsh normal. Ua neg for uti.   CT reviewed/interpreted by me - no hem.   Ivf, po fluids/food. Ambulate.   1522, some labs pending, bh eval pending. Signed out to Dr  Urban Garden to f/u with bh eval, check labs, recheck pt, and dispo appropriately.         Final Clinical Impression(s) / ED Diagnoses Final diagnoses:  Generalized weakness  Dehydration  Medication therapy changed  Acute alteration in mental status  Anxiety  History of depression    Rx / DC Orders ED Discharge Orders     None         Guadalupe Lee, MD 06/18/23 1523

## 2023-06-18 NOTE — BH Assessment (Addendum)
 A TTS order was placed at 1447. Due to an influx of patients at the [facility] and Emergency departments, the patient was deferred to IRIS.  At 1605, clinician deferred the patient to IRIS. The Care Coordinator, Alvena Aurora at [phone], was notified of the need for an IRIS consult. The IRIS Care Coordinator will provide updates to the patient's [facility] emergency room care teams regarding the timing of the patient's evaluation by an IRIS provider.    The [facility] Emergency Room Care team has been informed of all updates related to the patient's plan of care.   Update: TTS received an update from the ED provider Onetha Bile, MD) that patient is voluntary and doesn't want to stay. IRIS consult cancelled.

## 2023-06-18 NOTE — ED Provider Notes (Signed)
 Care of patient received from prior provider at 3:11 PM, please see their note for complete H/P and care plan.  Received handoff per ED course.  Clinical Course as of 06/18/23 1511  Mon Jun 18, 2023  1510 Stable Psychiatric eval. Anxiety and depression. Meds changed recently rapidly off his psych meds.  Klonopin addition. Manic behavior. Voluntary. [CC]    Clinical Course User Index [CC] Onetha Bile, MD    Reassessment: Patient was medically cleared per prior provider. Pending psychiatric evaluation. I reviewed his labs.  Agree with no acute pathology per ordered studies. Notably his CT head also negative for acute pathology.  I was called back to bedside.  Family insistent they do not want to stay at this facility for psychiatric evaluation as they have outpatient psychiatric resources already. I stated if there was nothing immediately medical they would feel comfortable taking care of him until he followed up with his normal psychiatric care provider.  He is overall well-appearing and I believe this would be reasonable.   Disposition:  I have considered need for hospitalization, however, considering all of the above, I believe this patient is stable for discharge at this time.  Patient/family educated about specific return precautions for given chief complaint and symptoms.  Patient/family educated about follow-up with PCP.     Patient/family expressed understanding of return precautions and need for follow-up. Patient spoken to regarding all imaging and laboratory results and appropriate follow up for these results. All education provided in verbal form with additional information in written form. Time was allowed for answering of patient questions. Patient discharged.    Emergency Department Medication Summary:   Medications  lactated ringers  bolus 1,000 mL (0 mLs Intravenous Stopped 06/18/23 1702)            Onetha Bile, MD 06/18/23 1759

## 2023-06-18 NOTE — ED Triage Notes (Signed)
 Pt recently dx w serotonin syndrome last week, "switched meds Thursday to help," concern for "weird effects- up & agitated vs sleeping, which was why they were prescribed." Also concern for recent need of cane/ walker for ambulation.

## 2023-06-19 LAB — RPR: RPR Ser Ql: NONREACTIVE

## 2023-06-20 DIAGNOSIS — F419 Anxiety disorder, unspecified: Secondary | ICD-10-CM | POA: Diagnosis not present

## 2023-06-20 DIAGNOSIS — G20A1 Parkinson's disease without dyskinesia, without mention of fluctuations: Secondary | ICD-10-CM | POA: Diagnosis not present

## 2023-06-20 DIAGNOSIS — G4709 Other insomnia: Secondary | ICD-10-CM | POA: Diagnosis not present

## 2023-06-29 ENCOUNTER — Other Ambulatory Visit: Payer: Self-pay | Admitting: Neurology

## 2023-06-29 DIAGNOSIS — G20A1 Parkinson's disease without dyskinesia, without mention of fluctuations: Secondary | ICD-10-CM | POA: Diagnosis not present

## 2023-06-29 DIAGNOSIS — F411 Generalized anxiety disorder: Secondary | ICD-10-CM | POA: Diagnosis not present

## 2023-06-29 DIAGNOSIS — Z1331 Encounter for screening for depression: Secondary | ICD-10-CM | POA: Diagnosis not present

## 2023-06-29 DIAGNOSIS — Z0000 Encounter for general adult medical examination without abnormal findings: Secondary | ICD-10-CM | POA: Diagnosis not present

## 2023-06-29 DIAGNOSIS — I10 Essential (primary) hypertension: Secondary | ICD-10-CM | POA: Diagnosis not present

## 2023-06-29 DIAGNOSIS — I4891 Unspecified atrial fibrillation: Secondary | ICD-10-CM | POA: Diagnosis not present

## 2023-06-29 DIAGNOSIS — G4700 Insomnia, unspecified: Secondary | ICD-10-CM | POA: Diagnosis not present

## 2023-06-29 DIAGNOSIS — Z23 Encounter for immunization: Secondary | ICD-10-CM | POA: Diagnosis not present

## 2023-06-29 DIAGNOSIS — E7800 Pure hypercholesterolemia, unspecified: Secondary | ICD-10-CM | POA: Diagnosis not present

## 2023-07-03 DIAGNOSIS — Z1211 Encounter for screening for malignant neoplasm of colon: Secondary | ICD-10-CM | POA: Diagnosis not present

## 2023-07-23 NOTE — Progress Notes (Unsigned)
 Assessment/Plan:   1.   Parkinsons Disease, diagnosed November, 2023             -continue  carbidopa /levodopa  25/100, 1.5 tablet 3 times per day.  -continue carbidopa /levodopa  50/200 at bed  -We discussed that it used to be thought that levodopa  would increase risk of melanoma but now it is believed that Parkinsons itself likely increases risk of melanoma. he is to get regular skin checks.  -refer to PT as more off balance now   2.  B12 deficiency             -On oral supplement   3.  History of cervical myelopathy from severe cervical stenosis at C5-C6             -Status post surgical intervention by Dr. Lamon Pillow in March, 2022.   4.  History of vestibular schwannoma             -Status post surgical intervention/removal many years ago (30+), but MRI in March, 2022 demonstrated 23 x 16 x 14 mm mass, consistent with vestibular schwannoma   5.  A-fib             -on eliquis    6.  Alcohol use with transaminasemia             -he is now limiting alcohol to no more than 2 days/week.    7.  Renal mass  - Status post removal of benign angiomyolipoma in March, 2025  8.  Insomnia  -on trazodone    9.  L ptosis  -stable but will check AchR ab  10.  Hx of acoustic neuroma  - MRI stable in February, 2025 with known 22 mm x 12 mm x 11 mm left vestibular schwannoma  11.  Anxiety  - improved now but was severe when going through #7.  This is far out of my area of expertise and other physicians are currently managing.  His primary care has been working with him to get an appointment with behavioral health but patient reports he is actually doing much better and doesn't plan to follow through with this.  No SI/HI now (did have SI at one time)  Subjective:   Patrick Gray was seen today in follow up for Parkinsons disease, diagnosed last visit.  Pt with wife who supplements hx.  My previous records were reviewed prior to todays visit as well as outside records available to me.  Patient had  an MRI brain done since last visit.  There is no change in his acoustic neuroma.  He did undergo removal of the renal mass in March, found to be an angiomyolipoma.  He ended up back in the emergency room in April with anxiety and restlessness.  He had seen [facility] and they were somewhat worried about possible serotonin syndrome and had tapered him down off of trazodone  and Zoloft and gave him a prescription for clonazepam.  They noted his behavior was somewhat manic.  Behavioral health consult was ordered, but ultimately patient declined and since he was not involuntary, he ended up leaving.  I did note that he saw his primary care fairly recently (about 1 month ago) and they did fax a referral to outpatient behavioral health.  They apparently did not receive a response and then apparently tried to fax a referral to [facility].  Pt states that he is not going to follow through with this as the anxiety is better and "I am  back to where I was."  No falls.  No near syncope.  Getting back to exercise.    Current prescribed movement disorder medications: Carbidopa /levodopa  25/100, 1.5 tablet 3 times per day carbidopa /levodopa  50/200 at bed B12 supplement   ALLERGIES:   Allergies  Allergen Reactions   Clonazepam     Serotin syndrome   Hydralazine      Serotonin syndrome   Prednisolone Other (See Comments)    Insomnia    Sertraline Other (See Comments)   Penicillins Rash and Other (See Comments)         CURRENT MEDICATIONS:  Current Meds  Medication Sig   AMBULATORY NON FORMULARY MEDICATION Take 2 capsules by mouth daily. Medication Name: Neuralli probiotic   amLODipine  (NORVASC ) 10 MG tablet Take 10 mg by mouth daily.   apixaban  (ELIQUIS ) 5 MG TABS tablet Take 1 tablet (5 mg total) by mouth 2 (two) times daily.   Ascorbic Acid (VITAMIN C CR) 1500 MG TBCR Take 1,500 mg by mouth daily.   atorvastatin  (LIPITOR) 20 MG tablet Take 20 mg by mouth daily.    carbidopa -levodopa  (SINEMET  CR) 50-200 MG tablet TAKE 1 TABLET BY MOUTH AT BEDTIME   carbidopa -levodopa  (SINEMET  IR) 25-100 MG tablet Take 1.5 tablets by mouth 3 (three) times daily. 7am/11am/4pm (Patient taking differently: Take 1.5 tablets by mouth 4 (four) times daily. 7am/11am/4pm, May take a 0.5 mg tablet twice daily as needed for restlessness)   Cholecalciferol  (VITAMIN D3) 250 MCG (10000 UT) capsule Take 10,000 Units by mouth daily.   Coenzyme Q10 (COQ10) 100 MG CAPS Take 100 mg by mouth daily.   cyanocobalamin  (VITAMIN B12) 1000 MCG tablet Take 1,000 mcg by mouth daily.   metoprolol  tartrate (LOPRESSOR ) 50 MG tablet TAKE 1 TABLET BY MOUTH TWICE DAILY. MAY TAKE AN EXTRA 1/2 TABLET AS NEEDED FOR BREAKTHROUGH AFIB   olmesartan-hydrochlorothiazide  (BENICAR HCT) 40-25 MG tablet Take 1 tablet by mouth daily.   potassium chloride  SA (KLOR-CON  M) 20 MEQ tablet Take 2 tablet (40 meq) by mouth in the morning & take 1 tablet (20 meq) by mouth in the evening. (Patient taking differently: Take 60 mEq by mouth daily.)   sildenafil  (REVATIO ) 20 MG tablet Take 20-100 mg by mouth daily as needed for erectile dysfunction.   traZODone  (DESYREL ) 100 MG tablet Take 100 mg by mouth at bedtime.     Objective:   PHYSICAL EXAMINATION:    VITALS:   Vitals:   07/26/23 0832  BP: 110/70  Pulse: 82  SpO2: 96%  Weight: 205 lb 6.4 oz (93.2 kg)  Height: 5\' 9"  (1.753 m)      GEN:  The patient appears stated age and is in NAD. HEENT:  Normocephalic, atraumatic.  The mucous membranes are moist. The superficial temporal arteries are without ropiness or tenderness. CV:  RRR Lungs:  CTAB Neck/HEME:  There are no carotid bruits bilaterally.   Neurological examination:   Orientation: The patient is alert and oriented x3.  Cranial nerves: There is decreased palpebral fissure on the L c/t R but otherwise good facial symmetry (stable).  Extraocular muscles are intact. The visual fields are full to confrontational  testing. The speech is fluent and clear. Soft palate rises symmetrically and there is no tongue deviation. Hearing is intact to conversational tone but decreased on the left (ever since removal of acoustic neuroma). Sensation: Sensation is intact to light touch throughout  Motor: Strength is at least antigravity x 4    Movement examination: Tone: There is nl tone in  the UE/LE Abnormal movements: there is LUE rest tremor, mod (stable).  There is no postural tremor or intention tremor.  There is re-emergent tremor on the L Coordination:  There is no decremation only with alternating supination and pronation of the forearm and toe taps on the L.  All other RAMs are normal Gait and Station: The patient pushes off to arise.  He drags the L leg and there is marked decrease arm swing on the L.  He is slightly off balance.  I have reviewed and interpreted the following labs independently    Chemistry      Component Value Date/Time   NA 136 06/18/2023 1329   NA 142 03/18/2019 1210   K 4.6 06/18/2023 1329   CL 102 06/18/2023 1329   CO2 23 06/18/2023 1329   BUN 28 (H) 06/18/2023 1329   BUN 16 03/18/2019 1210   CREATININE 1.12 06/18/2023 1329   CREATININE 0.87 11/15/2015 1009      Component Value Date/Time   CALCIUM  10.6 (H) 06/18/2023 1329   ALKPHOS 83 06/18/2023 1329   AST 53 (H) 06/18/2023 1329   ALT 39 06/18/2023 1329   BILITOT 0.7 06/18/2023 1329       Lab Results  Component Value Date   WBC 4.5 06/18/2023   HGB 15.3 06/18/2023   HCT 43.3 06/18/2023   MCV 90.4 06/18/2023   PLT 147 (L) 06/18/2023    Lab Results  Component Value Date   TSH 1.610 06/18/2023   Total time spent on today's visit was 30 minutes, including both face-to-face time and nonface-to-face time.  Time included that spent on review of records (prior notes available to me/labs/imaging if pertinent), discussing treatment and goals, answering patient's questions and coordinating care.    Cc:  Rae Bugler,  MD

## 2023-07-26 ENCOUNTER — Ambulatory Visit: Payer: Medicare PPO | Admitting: Neurology

## 2023-07-26 ENCOUNTER — Encounter: Payer: Self-pay | Admitting: Neurology

## 2023-07-26 VITALS — BP 110/70 | HR 82 | Ht 69.0 in | Wt 205.4 lb

## 2023-07-26 DIAGNOSIS — F411 Generalized anxiety disorder: Secondary | ICD-10-CM | POA: Diagnosis not present

## 2023-07-26 DIAGNOSIS — G20A1 Parkinson's disease without dyskinesia, without mention of fluctuations: Secondary | ICD-10-CM | POA: Diagnosis not present

## 2023-07-26 MED ORDER — CARBIDOPA-LEVODOPA ER 50-200 MG PO TBCR: 1.0000 | EXTENDED_RELEASE_TABLET | Freq: Every day | ORAL | 1 refills | Status: AC

## 2023-07-26 NOTE — Patient Instructions (Signed)
 Save the Date!  Its at the [facility] on 9/19.  The physicians and staff at [facility] are committed to providing excellent care. You may receive a survey requesting feedback about your experience at our office. We strive to receive "very good" responses to the survey questions. If you feel that your experience would prevent you from giving the office a "very good " response, please contact our office to try to remedy the situation. We may be reached at [phone]. Thank you for taking the time out of your busy day to complete the survey.

## 2023-08-15 DIAGNOSIS — M8588 Other specified disorders of bone density and structure, other site: Secondary | ICD-10-CM | POA: Diagnosis not present

## 2023-08-15 DIAGNOSIS — R29890 Loss of height: Secondary | ICD-10-CM | POA: Diagnosis not present

## 2023-08-21 ENCOUNTER — Ambulatory Visit

## 2023-08-27 ENCOUNTER — Ambulatory Visit: Attending: Neurology

## 2023-08-27 DIAGNOSIS — G20A1 Parkinson's disease without dyskinesia, without mention of fluctuations: Secondary | ICD-10-CM | POA: Insufficient documentation

## 2023-08-27 DIAGNOSIS — R29818 Other symptoms and signs involving the nervous system: Secondary | ICD-10-CM | POA: Diagnosis not present

## 2023-08-27 DIAGNOSIS — R278 Other lack of coordination: Secondary | ICD-10-CM | POA: Diagnosis not present

## 2023-08-27 DIAGNOSIS — R262 Difficulty in walking, not elsewhere classified: Secondary | ICD-10-CM | POA: Diagnosis not present

## 2023-08-27 DIAGNOSIS — M6281 Muscle weakness (generalized): Secondary | ICD-10-CM | POA: Diagnosis not present

## 2023-08-27 DIAGNOSIS — R2681 Unsteadiness on feet: Secondary | ICD-10-CM | POA: Diagnosis not present

## 2023-08-27 NOTE — Therapy (Addendum)
 OUTPATIENT PHYSICAL THERAPY NEURO EVALUATION   Patient Name: Patrick Gray MRN: [id number] DOB:August 30, 1955, 68 y.o., male Today's Date: 08/27/2023   PCP: Seabron Lenis, MD REFERRING PROVIDER: Evonnie Asberry RAMAN, DO  END OF SESSION:  PT End of Session - 08/27/23 1400     Visit Number 1    Number of Visits 12    Date for PT Re-Evaluation 10/08/23    Authorization Type [employer]    Authorization Time Period auth submitted    PT Start Time 1400    PT Stop Time 1445    PT Time Calculation (min) 45 min          Past Medical History:  Diagnosis Date   Acoustic neuroma (HCC)    left   Anxiety    Arthritis    Complication of anesthesia    Delusion, violent, hallucination, hospitalized 8 days, urine retention   Coronary artery disease    Deafness in left ear    Delusion (HCC)    after fall on pain meds and prednisone   Dysrhythmia    PAF   Foley catheter in place    H/O cardiac radiofrequency ablation    HTN (hypertension)    Hypercholesterolemia    Hypopotassemia    Insomnia    Parkinson's disease (HCC)    Paroxysmal atrial fibrillation (HCC)    Radiculopathy    after fall   Tremor    left hand   Vitamin B 12 deficiency    Past Surgical History:  Procedure Laterality Date   ACOUSTIC NEUROMA RESECTION Left 19990   ANTERIOR CERVICAL DECOMP/DISCECTOMY FUSION N/A 04/26/2020   Procedure: Anterior Cervical Decompression Fusion - Cervical five-Cervical six - Cervical six-Cervical seven;  Surgeon: Onetha Kuba, MD;  Location: [facility] OR;  Service: Neurosurgery;  Laterality: N/A;   APPENDECTOMY  1976   ATRIAL FIBRILLATION ABLATION N/A 12/19/2016   Procedure: ATRIAL FIBRILLATION ABLATION;  Surgeon: Kelsie Agent, MD;  Location: [facility] INVASIVE CV LAB;  Service: Cardiovascular;  Laterality: N/A;   CARDIAC CATHETERIZATION N/A 10/07/2015   Procedure: Left Heart Cath and Coronary Angiography;  Surgeon: Maude JAYSON Emmer, MD;  Location: [facility] INVASIVE CV LAB;  Service: Cardiovascular;   Laterality: N/A;   CARDIOVERSION N/A 06/17/2013   Procedure: CARDIOVERSION;  Surgeon: Maude JAYSON Emmer, MD;  Location: [facility] ENDOSCOPY;  Service: Cardiovascular;  Laterality: N/A;   CARDIOVERSION N/A 05/11/2016   Procedure: CARDIOVERSION;  Surgeon: Jerel Balding, MD;  Location: [facility] ENDOSCOPY;  Service: Cardiovascular;  Laterality: N/A;   CARDIOVERSION N/A 05/24/2017   Procedure: CARDIOVERSION;  Surgeon: Shlomo Wilbert SAUNDERS, MD;  Location: [facility] ENDOSCOPY;  Service: Cardiovascular;  Laterality: N/A;   CARDIOVERSION N/A 11/19/2017   Procedure: CARDIOVERSION;  Surgeon: Mona Vinie JAYSON, MD;  Location: [facility] ENDOSCOPY;  Service: Cardiovascular;  Laterality: N/A;   CHOLECYSTECTOMY  1976   CLUB FOOT RELEASE  1968   ELECTROPHYSIOLOGIC STUDY N/A 11/30/2015   Procedure: Atrial Fibrillation Ablation;  Surgeon: Agent Kelsie, MD;  Location: [facility] INVASIVE CV LAB;  Service: Cardiovascular;  Laterality: N/A;   ROBOT ASSISTED LAPAROSCOPIC NEPHRECTOMY Right 04/27/2023   Procedure: XI RIGHT RETROPERITONEAL ROBOTIC ASSISTED LAPAROSCOPIC RADICAL NEPHRECTOMY;  Surgeon: Alvaro Ricardo KATHEE Mickey., MD;  Location: [facility] ORS;  Service: Urology;  Laterality: Right;  180 MINUTES NEEDED FOR CASE   TEE WITHOUT CARDIOVERSION N/A 05/11/2016   Procedure: TRANSESOPHAGEAL ECHOCARDIOGRAM (TEE);  Surgeon: Jerel Balding, MD;  Location: [facility] ENDOSCOPY;  Service: Cardiovascular;  Laterality: N/A;   TONSILLECTOMY  1967   TOTAL SHOULDER ARTHROPLASTY Left 06/15/2016  Procedure: TOTAL SHOULDER ARTHROPLASTY;  Surgeon: Eva Herring, MD;  Location: [facility] OR;  Service: Orthopedics;  Laterality: Left;  Left total shoulder arthroplasty   TRANSURETHRAL RESECTION OF PROSTATE N/A 07/02/2020   Procedure: TRANSURETHRAL RESECTION OF THE PROSTATE (TURP);  Surgeon: Carolee Sherwood JONETTA DOUGLAS, MD;  Location: [facility] ORS;  Service: Urology;  Laterality: N/A;   Patient Active Problem List   Diagnosis Date Noted   Renal mass, right 04/27/2023   Hypercoagulable state due to persistent atrial  fibrillation (HCC) 08/02/2022   Parkinson's disease without dyskinesia or fluctuating manifestations (HCC) 12/21/2021   BPH (benign prostatic hyperplasia) 07/02/2020   Myelopathy (HCC) 04/26/2020   Atrial flutter (HCC) 12/19/2016   S/P shoulder replacement, left 06/15/2016   A-fib (HCC) 11/30/2015   HTN (hypertension)    Hypercholesterolemia    Deafness in left ear    Atrial fibrillation (HCC)    Hypopotassemia     ONSET DATE: March 2025  REFERRING DIAG:  G20.A1 (ICD-10-CM) - Parkinson's disease without dyskinesia or fluctuating manifestations (HCC)    THERAPY DIAG:  Parkinson's disease without dyskinesia or fluctuating manifestations (HCC)  Muscle weakness (generalized)  Unsteadiness on feet  Difficulty in walking, not elsewhere classified  Rationale for Evaluation and Treatment: Rehabilitation  SUBJECTIVE:                                                                                                                                                                                             SUBJECTIVE STATEMENT: Was found to have an issue with this kidney and had surgery in March 2025 and has had decline since recovering from that procedure and has had increasing weakness, balance problems, and reduced endurance since that time. Spouse notes decreased speed in mobility, increased tripping, and freezing of gait.  Reports using stationary bike at home most days and balance exercises at home. Not able to walk outdoors as frequently due to weather and busy traffic around home.   Pt accompanied by: self  PERTINENT HISTORY: Parkinsons Disease, diagnosed November, 2023. History of cervical myelopathy from severe cervical stenosis at C5-C6             -Status post surgical intervention by Dr. Onetha in March, 2022. History of vestibular schwannoma             -Status post surgical intervention/removal many years ago (30+), but MRI in March, 2022 demonstrated 23 x 16 x 14 mm mass,  consistent with vestibular schwannoma - left total shoulder replacement PAIN:  Are you having pain? Pain pretty much everywhere from past surgeries Has been having increased mid-thoracic pain which limits activity--notes prolonged standing aggravates  PRECAUTIONS: Fall  RED FLAGS: None   WEIGHT BEARING RESTRICTIONS: No  FALLS: Has patient fallen in last 6 months? No  LIVING ENVIRONMENT: Lives with: lives with their spouse Lives in: House/apartment Stairs: a few steps to enter. Deck in back of home w/ HR Has following equipment at home: occasional use of cane  PLOF: Independent with basic ADLs  PATIENT GOALS: improve balance and stamina, get back to where I was a year ago  OBJECTIVE:  Note: Objective measures were completed at Evaluation unless otherwise noted.  DIAGNOSTIC FINDINGS:   COGNITION: Overall cognitive status: Within functional limits for tasks assessed   SENSATION: Not tested  COORDINATION: WFL  EDEMA:    MUSCLE TONE: NT    DTRs:  NT  POSTURE: forward head  LOWER EXTREMITY ROM:     WFL  LOWER EXTREMITY MMT:    Grossly 4/5 to seated resisted tests  BED MOBILITY:  indep  TRANSFERS: independent  CURB:  Findings: independent  STAIRS: Not tested GAIT: Findings: Distance walked:   and Comments: WFL, deficits during turning R vs L  FUNCTIONAL TESTS:  5 times sit to stand: 10 sec 10 meter walk test: 11.84 sec = 2.7 ft/sec  Mini-BESTest: 19/28 3 meter backwards walk test: 8.3 sec    [facility] PT Assessment - 08/27/23 0001       Standardized Balance Assessment   Standardized Balance Assessment Mini-BESTest      Mini-BESTest   Sit To Stand Normal: Comes to stand without use of hands and stabilizes independently.    Rise to Toes Normal: Stable for 3 s with maximum height.    Stand on one leg (left) Moderate: < 20 s    Stand on one leg (right) Moderate: < 20 s    Stand on one leg - lowest score 1    Compensatory Stepping  Correction - Forward Moderate: More than one step is required to recover equilibrium    Compensatory Stepping Correction - Backward Moderate: More than one step is required to recover equilibrium    Compensatory Stepping Correction - Left Lateral Moderate: Several steps to recover equilibrium    Compensatory Stepping Correction - Right Lateral Moderate: Several steps to recover equilibrium    Stepping Corredtion Lateral - lowest score 1    Stance - Feet together, eyes open, firm surface  Normal: 30s    Stance - Feet together, eyes closed, foam surface  Moderate: < 30s    Incline - Eyes Closed Normal: Stands independently 30s and aligns with gravity    Change in Gait Speed Normal: Significantly changes walkling speed without imbalance    Walk with head turns - Horizontal Moderate: performs head turns with reduction in gait speed.    Walk with pivot turns Moderate:Turns with feet close SLOW (>4 steps) with good balance.    Step over obstacles Normal: Able to step over box with minimal change of gait speed and with good balance.    Timed UP & GO with Dual Task Severe: Stops counting while walking OR stops walking while counting.   regular: 9.87 sec; cog: 14.78 sec   Mini-BEST total score 19  TREATMENT DATE: 08/27/23    PATIENT EDUCATION: Education details: assessment details, rationale of intervention, pedaling for PD cycling protocol and beginner HIIT for stationary bike Person educated: Patient and Spouse Education method: Chief Technology Officer Education comprehension: verbalized understanding  HOME EXERCISE PROGRAM: Provided handout for cycling HIIT to progress for Pedaling for PD protocol  GOALS: Goals reviewed with patient? Yes  SHORT TERM GOALS: Target date: 09/17/2023    Patient will be independent in HEP to improve functional  outcomes Baseline: Goal status: INITIAL  2.  Demo improved postural stability per mild-mod sway x 30 sec condition 4 M-CTSIB to improve safety with ADL Baseline: mod-severe x 10 sec Goal status: INITIAL    LONG TERM GOALS: Target date: 10/08/2023    to be performed and goal developed Baseline:  Goal status: INITIAL  2.  Demo improved balance, motor control, and reduced risk for falls per score 24/28 Mini-BESTest Baseline: 19/28 Goal status: INITIAL  3.  Demo reduced risk for falls per time 4.8 sec 3 meter Backwards Walk Test Baseline: 8.3 sec Goal status: INITIAL  4.  Report improved activity tolerance as evidenced by ability to participate in community activities, e.g. shopping, exercise class, outings with friends, etc Baseline:  Goal status: INITIAL  5.  Pt to report improved thoracic pain as to not limiting standing ADL/housekeeping tasks to improve activity tolerance  Baseline: 6/10 thoracic pain with prolonged standing  Goal status: INITIAL ASSESSMENT:  CLINICAL IMPRESSION: Patient is a 68 y.o. male who was seen today for physical therapy evaluation and treatment for Parkinson's Disease.  Pt reports remote hx of surgery and experiencing functional decline since that time w/ decrease in strength, activity tolerance, balance, and gait deviations.  Demonstrates difficulty with postural control and balance as evidenced by score 19/28 Mini-BESTest indicating increased risk for falls as well as with .  Pt reports activity limitations not able to ambulate community-level distances due to fatigue and balance issues. Would benefit from PT services to address deficits and limitations to improve functional mobility and reduce risk for falls.    OBJECTIVE IMPAIRMENTS: decreased activity tolerance, decreased balance, decreased endurance, decreased knowledge of use of DME, difficulty walking, decreased strength, and pain.   ACTIVITY LIMITATIONS: carrying, lifting, bending,  standing, reach over head, and locomotion level  PARTICIPATION LIMITATIONS: meal prep, cleaning, laundry, interpersonal relationship, shopping, community activity, and yard work  PERSONAL FACTORS: Age, Time since onset of injury/illness/exacerbation, and 3+ comorbidities: PMH are also affecting patient's functional outcome.   REHAB POTENTIAL: Good  CLINICAL DECISION MAKING: Evolving/moderate complexity  EVALUATION COMPLEXITY: Moderate  PLAN:  PT FREQUENCY: 1-2x/week  PT DURATION: 6 weeks  PLANNED INTERVENTIONS: 97750- Physical Performance Testing, 97110-Therapeutic exercises, 97530- Therapeutic activity, [id number]- Neuromuscular re-education, 97535- Self Care, [id number]- Manual therapy, [id number]- Gait training, [phone]- Orthotic Initial, [phone]- Orthotic/Prosthetic subsequent, [phone]- Canalith repositioning, and [phone]- Aquatic Therapy  PLAN FOR NEXT SESSION: , HEP development/review of past HEP, treadmill training, bike HIIT for home performance   Jonette MARLA Sandifer, PT 08/27/2023, 4:49 PM  Referring diagnosis? G20.A1 (ICD-10-CM) - Parkinson's disease without dyskinesia or fluctuating manifestations (HCC) Treatment diagnosis? (if different than referring diagnosis)  Parkinson's disease without dyskinesia or fluctuating manifestations (HCC)  Muscle weakness (generalized)  Unsteadiness on feet  Difficulty in walking, not elsewhere classified What was this (referring dx) caused by? []  Surgery []  Fall [x]  Ongoing issue []  Arthritis []  Other: ____________  Laterality: []  Rt []  Lt [x]  Both  Check all possible CPT codes:  *CHOOSE 10 OR LESS*  See Planned Interventions listed in the Plan section of the Evaluation.

## 2023-09-04 ENCOUNTER — Ambulatory Visit: Admitting: Rehabilitative and Restorative Service Providers"

## 2023-09-04 DIAGNOSIS — R262 Difficulty in walking, not elsewhere classified: Secondary | ICD-10-CM | POA: Diagnosis not present

## 2023-09-04 DIAGNOSIS — R278 Other lack of coordination: Secondary | ICD-10-CM | POA: Diagnosis not present

## 2023-09-04 DIAGNOSIS — R2681 Unsteadiness on feet: Secondary | ICD-10-CM

## 2023-09-04 DIAGNOSIS — G20A1 Parkinson's disease without dyskinesia, without mention of fluctuations: Secondary | ICD-10-CM

## 2023-09-04 DIAGNOSIS — R29818 Other symptoms and signs involving the nervous system: Secondary | ICD-10-CM

## 2023-09-04 DIAGNOSIS — M6281 Muscle weakness (generalized): Secondary | ICD-10-CM

## 2023-09-04 NOTE — Therapy (Signed)
 OUTPATIENT PHYSICAL THERAPY NEURO TREATMENT  Patient Name: Patrick Gray MRN: [id number] DOB:September 02, 1955, 68 y.o., male Today's Date: 09/04/2023  PCP: Seabron Lenis, MD REFERRING PROVIDER: Evonnie Asberry RAMAN, DO  END OF SESSION:  PT End of Session - 09/04/23 1104     Visit Number 2    Number of Visits 12    Date for PT Re-Evaluation 10/08/23    Authorization Type [employer]    Authorization Time Period auth submitted    PT Start Time 1105    PT Stop Time 1148    PT Time Calculation (min) 43 min    Activity Tolerance Patient tolerated treatment well    Behavior During Therapy WFL for tasks assessed/performed          Past Medical History:  Diagnosis Date   Acoustic neuroma (HCC)    left   Anxiety    Arthritis    Complication of anesthesia    Delusion, violent, hallucination, hospitalized 8 days, urine retention   Coronary artery disease    Deafness in left ear    Delusion (HCC)    after fall on pain meds and prednisone   Dysrhythmia    PAF   Foley catheter in place    H/O cardiac radiofrequency ablation    HTN (hypertension)    Hypercholesterolemia    Hypopotassemia    Insomnia    Parkinson's disease (HCC)    Paroxysmal atrial fibrillation (HCC)    Radiculopathy    after fall   Tremor    left hand   Vitamin B 12 deficiency    Past Surgical History:  Procedure Laterality Date   ACOUSTIC NEUROMA RESECTION Left 19990   ANTERIOR CERVICAL DECOMP/DISCECTOMY FUSION N/A 04/26/2020   Procedure: Anterior Cervical Decompression Fusion - Cervical five-Cervical six - Cervical six-Cervical seven;  Surgeon: Onetha Kuba, MD;  Location: [facility] OR;  Service: Neurosurgery;  Laterality: N/A;   APPENDECTOMY  1976   ATRIAL FIBRILLATION ABLATION N/A 12/19/2016   Procedure: ATRIAL FIBRILLATION ABLATION;  Surgeon: Kelsie Agent, MD;  Location: [facility] INVASIVE CV LAB;  Service: Cardiovascular;  Laterality: N/A;   CARDIAC CATHETERIZATION N/A 10/07/2015   Procedure: Left Heart Cath and  Coronary Angiography;  Surgeon: Maude JAYSON Emmer, MD;  Location: [facility] INVASIVE CV LAB;  Service: Cardiovascular;  Laterality: N/A;   CARDIOVERSION N/A 06/17/2013   Procedure: CARDIOVERSION;  Surgeon: Maude JAYSON Emmer, MD;  Location: [facility] ENDOSCOPY;  Service: Cardiovascular;  Laterality: N/A;   CARDIOVERSION N/A 05/11/2016   Procedure: CARDIOVERSION;  Surgeon: Jerel Balding, MD;  Location: [facility] ENDOSCOPY;  Service: Cardiovascular;  Laterality: N/A;   CARDIOVERSION N/A 05/24/2017   Procedure: CARDIOVERSION;  Surgeon: Shlomo Wilbert SAUNDERS, MD;  Location: [facility] ENDOSCOPY;  Service: Cardiovascular;  Laterality: N/A;   CARDIOVERSION N/A 11/19/2017   Procedure: CARDIOVERSION;  Surgeon: Mona Vinie JAYSON, MD;  Location: [facility] ENDOSCOPY;  Service: Cardiovascular;  Laterality: N/A;   CHOLECYSTECTOMY  1976   CLUB FOOT RELEASE  1968   ELECTROPHYSIOLOGIC STUDY N/A 11/30/2015   Procedure: Atrial Fibrillation Ablation;  Surgeon: Agent Kelsie, MD;  Location: [facility] INVASIVE CV LAB;  Service: Cardiovascular;  Laterality: N/A;   ROBOT ASSISTED LAPAROSCOPIC NEPHRECTOMY Right 04/27/2023   Procedure: XI RIGHT RETROPERITONEAL ROBOTIC ASSISTED LAPAROSCOPIC RADICAL NEPHRECTOMY;  Surgeon: Alvaro Ricardo KATHEE Mickey., MD;  Location: [facility] ORS;  Service: Urology;  Laterality: Right;  180 MINUTES NEEDED FOR CASE   TEE WITHOUT CARDIOVERSION N/A 05/11/2016   Procedure: TRANSESOPHAGEAL ECHOCARDIOGRAM (TEE);  Surgeon: Jerel Balding, MD;  Location: [facility] ENDOSCOPY;  Service:  Cardiovascular;  Laterality: N/A;   TONSILLECTOMY  1967   TOTAL SHOULDER ARTHROPLASTY Left 06/15/2016   Procedure: TOTAL SHOULDER ARTHROPLASTY;  Surgeon: Eva Herring, MD;  Location: [facility] OR;  Service: Orthopedics;  Laterality: Left;  Left total shoulder arthroplasty   TRANSURETHRAL RESECTION OF PROSTATE N/A 07/02/2020   Procedure: TRANSURETHRAL RESECTION OF THE PROSTATE (TURP);  Surgeon: Carolee Sherwood JONETTA DOUGLAS, MD;  Location: [facility] ORS;  Service: Urology;  Laterality: N/A;   Patient Active Problem List    Diagnosis Date Noted   Renal mass, right 04/27/2023   Hypercoagulable state due to persistent atrial fibrillation (HCC) 08/02/2022   Parkinson's disease without dyskinesia or fluctuating manifestations (HCC) 12/21/2021   BPH (benign prostatic hyperplasia) 07/02/2020   Myelopathy (HCC) 04/26/2020   Atrial flutter (HCC) 12/19/2016   S/P shoulder replacement, left 06/15/2016   A-fib (HCC) 11/30/2015   HTN (hypertension)    Hypercholesterolemia    Deafness in left ear    Atrial fibrillation (HCC)    Hypopotassemia     ONSET DATE: March 2025 REFERRING DIAG:  G20.A1 (ICD-10-CM) - Parkinson's disease without dyskinesia or fluctuating manifestations (HCC)   THERAPY DIAG:  Parkinson's disease without dyskinesia or fluctuating manifestations (HCC)  Muscle weakness (generalized)  Unsteadiness on feet  Difficulty in walking, not elsewhere classified  Other symptoms and signs involving the nervous system  Rationale for Evaluation and Treatment: Rehabilitation  SUBJECTIVE:                                                                                                                                                                                       SUBJECTIVE STATEMENT: The patient reports that he has declined since his surgery in March. PT gave him HIIT program for bike and he has started (1 min ride + 2 min recovery).  EVAL: Was found to have an issue with this kidney and had surgery in March 2025 and has had decline since recovering from that procedure and has had increasing weakness, balance problems, and reduced endurance since that time. Spouse notes decreased speed in mobility, increased tripping, and freezing of gait.  Reports using stationary bike at home most days and balance exercises at home. Not able to walk outdoors as frequently due to weather and busy traffic around home.  Pt accompanied by: self  PERTINENT HISTORY: Parkinsons Disease, diagnosed November, 2023. History of  cervical myelopathy from severe cervical stenosis at C5-C6             -Status post surgical intervention by Dr. Onetha in March, 2022. History of vestibular schwannoma             -Status post surgical intervention/removal many  years ago (30+), but MRI in March, 2022 demonstrated 23 x 16 x 14 mm mass, consistent with vestibular schwannoma - left total shoulder replacement PAIN:  Are you having pain? Pain pretty much everywhere from past surgeries Has been having increased mid-thoracic pain which limits activity--notes prolonged standing aggravates  PRECAUTIONS: Fall  WEIGHT BEARING RESTRICTIONS: No  FALLS: Has patient fallen in last 6 months? No  LIVING ENVIRONMENT: Lives with: lives with their spouse Lives in: House/apartment Stairs: a few steps to enter. Deck in back of home w/ HR Has following equipment at home: occasional use of cane  PLOF: Independent with basic ADLs  PATIENT GOALS: improve balance and stamina, get back to where I was a year ago  OBJECTIVE:   [facility] Adult PT Treatment:                                                DATE: 09/04/23 Therapeutic Exercise: Standing L with scapular retraction Rolling a ball ant/posterior focusing on hip hinge for postural stretch Supine SLR x 10 reps R and L Sidelying Hip abduction x 10 reps with  Neuromuscular re-ed: PWR! Up positioning Sit<>stand transitioning x 5 reps x 2 sets working on [employer] up positioning Therapeutic Activity: Lateral step ups to 4 step Feet in stride performing anterior and posterior weight shifting with UE reaching for trunk elongation with SBA/CGA and demo cues Gait: Treadmill training for warm up working on longer stride length, upright posture at 1.4 mph x 3 minutes Gait with emphasis on upright posture, longer stride   PATIENT EDUCATION: Education details: assessment details, rationale of intervention, pedaling for PD cycling protocol and beginner HIIT for stationary bike Person educated:  Patient and Spouse Education method: Chief Technology Officer Education comprehension: verbalized understanding  HOME EXERCISE PROGRAM: Provided handout for cycling HIIT to progress for Pedaling for PD protocol  Access Code: [email or web address] URL: [email or web address] Date: 09/04/2023 Prepared by: Tawni Ferrier  Program Notes n/a  Exercises - Shoulder External Rotation and Scapular Retraction  - 1-2 x daily - 5 x weekly - 2 sets - 10 reps - Sit to Stand with Arm Swing  - 1-2 x daily - 5 x weekly - 2 sets - 10 reps - Active Straight Leg Raise with Quad Set  - 1-2 x daily - 5 x weekly - 2 sets - 10 reps - Sidelying Hip Abduction  - 1-2 x daily - 5 x weekly - 2 sets - 10 reps  ________________________________________________________________________________________ Note: Objective measures were completed at Evaluation unless otherwise noted.  DIAGNOSTIC FINDINGS:   COGNITION: Overall cognitive status: Within functional limits for tasks assessed   SENSATION: Not tested  COORDINATION: WFL  POSTURE: forward head  LOWER EXTREMITY ROM:    WFL  LOWER EXTREMITY MMT:   Grossly 4/5 to seated resisted tests  BED MOBILITY:  indep  TRANSFERS: independent  CURB:  Findings: independent  STAIRS: Not tested GAIT: Findings: Distance walked:   and Comments: WFL, deficits during turning R vs L  FUNCTIONAL TESTS:  5 times sit to stand: 10 sec 10 meter walk test: 11.84 sec = 2.7 ft/sec  Mini-BESTest: 19/28 3 meter backwards walk test: 8.3 sec    _______________________________________________________________________________________  GOALS: Goals reviewed with patient? Yes  SHORT TERM GOALS: Target date: 09/17/2023   Patient will be independent in HEP to improve functional outcomes Baseline: Goal status: INITIAL  2.  Demo improved postural stability per mild-mod sway x 30 sec condition 4 M-CTSIB to improve safety with ADL Baseline: mod-severe x 10 sec Goal  status: INITIAL  LONG TERM GOALS: Target date: 10/08/2023   to be performed and goal developed Baseline:  Goal status: INITIAL  2.  Demo improved balance, motor control, and reduced risk for falls per score 24/28 Mini-BESTest Baseline: 19/28 Goal status: INITIAL  3.  Demo reduced risk for falls per time 4.8 sec 3 meter Backwards Walk Test Baseline: 8.3 sec Goal status: INITIAL  4.  Report improved activity tolerance as evidenced by ability to participate in community activities, e.g. shopping, exercise class, outings with friends, etc Baseline:  Goal status: INITIAL  5.  Pt to report improved thoracic pain as to not limiting standing ADL/housekeeping tasks to improve activity tolerance  Baseline: 6/10 thoracic pain with prolonged standing  Goal status: INITIAL ASSESSMENT:  CLINICAL IMPRESSION: The patient on treadmill is noted to have lateral hip instability and during strengthening hip hiking at the step reported a snapping and pain in the R knee. PT added some SLR and hip abduction to initiate for HEP due to weakness noted with functional tasks and during SLR. PT also focused on postural upright positioning and functional strength training. Patient and his spouse are engaged during session and he appears to have good support for carryover to home.  EVAL: Patient is a 68 y.o. male who was seen today for physical therapy evaluation and treatment for Parkinson's Disease.  Pt reports remote hx of surgery and experiencing functional decline since that time w/ decrease in strength, activity tolerance, balance, and gait deviations.  Demonstrates difficulty with postural control and balance as evidenced by score 19/28 Mini-BESTest indicating increased risk for falls as well as with .  Pt reports activity limitations not able to ambulate community-level distances due to fatigue and balance issues. Would benefit from PT services to address deficits and limitations to improve functional  mobility and reduce risk for falls.    OBJECTIVE IMPAIRMENTS: decreased activity tolerance, decreased balance, decreased endurance, decreased knowledge of use of DME, difficulty walking, decreased strength, and pain.   PLAN:  PT FREQUENCY: 1-2x/week  PT DURATION: 6 weeks  PLANNED INTERVENTIONS: 97750- Physical Performance Testing, 97110-Therapeutic exercises, 97530- Therapeutic activity, [id number]- Neuromuscular re-education, 97535- Self Care, [id number]- Manual therapy, [id number]- Gait training, [phone]- Orthotic Initial, [phone]- Orthotic/Prosthetic subsequent, [phone]- Canalith repositioning, and [phone]- Aquatic Therapy  PLAN FOR NEXT SESSION: (did not do 2nd visit-- do next session), HEP review-- plan to shift back towards past HEP (from prior episode), treadmill training, bike HIIT for home performance, LE strengthening, postural training, PWR! Exercises.   Meeya Goldin, PT 09/04/2023, 12:33 PM

## 2023-09-05 NOTE — Therapy (Signed)
 OUTPATIENT PHYSICAL THERAPY NEURO TREATMENT  Patient Name: Patrick Gray MRN: [id number] DOB:[birth year], 68 y.o., male Today's Date: 09/06/2023  PCP: Seabron Lenis, MD REFERRING PROVIDER: Evonnie Asberry RAMAN, DO  END OF SESSION:  PT End of Session - 09/06/23 1147     Visit Number 3    Number of Visits 12    Date for PT Re-Evaluation 10/08/23    Authorization Type [employer]    Authorization Time Period approved 12 PT visits from 08/27/2023-10/08/2023    PT Start Time 1100    PT Stop Time 1145    PT Time Calculation (min) 45 min    Equipment Utilized During Treatment Gait belt    Activity Tolerance Patient tolerated treatment well    Behavior During Therapy WFL for tasks assessed/performed           Past Medical History:  Diagnosis Date   Acoustic neuroma (HCC)    left   Anxiety    Arthritis    Complication of anesthesia    Delusion, violent, hallucination, hospitalized 8 days, urine retention   Coronary artery disease    Deafness in left ear    Delusion (HCC)    after fall on pain meds and prednisone   Dysrhythmia    PAF   Foley catheter in place    H/O cardiac radiofrequency ablation    HTN (hypertension)    Hypercholesterolemia    Hypopotassemia    Insomnia    Parkinson's disease (HCC)    Paroxysmal atrial fibrillation (HCC)    Radiculopathy    after fall   Tremor    left hand   Vitamin B 12 deficiency    Past Surgical History:  Procedure Laterality Date   ACOUSTIC NEUROMA RESECTION Left 19990   ANTERIOR CERVICAL DECOMP/DISCECTOMY FUSION N/A 04/26/2020   Procedure: Anterior Cervical Decompression Fusion - Cervical five-Cervical six - Cervical six-Cervical seven;  Surgeon: Onetha Kuba, MD;  Location: [facility] OR;  Service: Neurosurgery;  Laterality: N/A;   APPENDECTOMY  1976   ATRIAL FIBRILLATION ABLATION N/A 12/19/2016   Procedure: ATRIAL FIBRILLATION ABLATION;  Surgeon: Kelsie Agent, MD;  Location: [facility] INVASIVE CV LAB;  Service: Cardiovascular;  Laterality:  N/A;   CARDIAC CATHETERIZATION N/A 10/07/2015   Procedure: Left Heart Cath and Coronary Angiography;  Surgeon: Maude JAYSON Emmer, MD;  Location: [facility] INVASIVE CV LAB;  Service: Cardiovascular;  Laterality: N/A;   CARDIOVERSION N/A 06/17/2013   Procedure: CARDIOVERSION;  Surgeon: Maude JAYSON Emmer, MD;  Location: [facility] ENDOSCOPY;  Service: Cardiovascular;  Laterality: N/A;   CARDIOVERSION N/A 05/11/2016   Procedure: CARDIOVERSION;  Surgeon: Jerel Balding, MD;  Location: [facility] ENDOSCOPY;  Service: Cardiovascular;  Laterality: N/A;   CARDIOVERSION N/A 05/24/2017   Procedure: CARDIOVERSION;  Surgeon: Shlomo Wilbert SAUNDERS, MD;  Location: [facility] ENDOSCOPY;  Service: Cardiovascular;  Laterality: N/A;   CARDIOVERSION N/A 11/19/2017   Procedure: CARDIOVERSION;  Surgeon: Mona Vinie JAYSON, MD;  Location: [facility] ENDOSCOPY;  Service: Cardiovascular;  Laterality: N/A;   CHOLECYSTECTOMY  1976   CLUB FOOT RELEASE  1968   ELECTROPHYSIOLOGIC STUDY N/A 11/30/2015   Procedure: Atrial Fibrillation Ablation;  Surgeon: Agent Kelsie, MD;  Location: [facility] INVASIVE CV LAB;  Service: Cardiovascular;  Laterality: N/A;   ROBOT ASSISTED LAPAROSCOPIC NEPHRECTOMY Right 04/27/2023   Procedure: XI RIGHT RETROPERITONEAL ROBOTIC ASSISTED LAPAROSCOPIC RADICAL NEPHRECTOMY;  Surgeon: Alvaro Ricardo KATHEE Mickey., MD;  Location: [facility] ORS;  Service: Urology;  Laterality: Right;  180 MINUTES NEEDED FOR CASE   TEE WITHOUT CARDIOVERSION N/A 05/11/2016   Procedure:  TRANSESOPHAGEAL ECHOCARDIOGRAM (TEE);  Surgeon: Jerel Balding, MD;  Location: [facility] ENDOSCOPY;  Service: Cardiovascular;  Laterality: N/A;   TONSILLECTOMY  1967   TOTAL SHOULDER ARTHROPLASTY Left 06/15/2016   Procedure: TOTAL SHOULDER ARTHROPLASTY;  Surgeon: Eva Herring, MD;  Location: [facility] OR;  Service: Orthopedics;  Laterality: Left;  Left total shoulder arthroplasty   TRANSURETHRAL RESECTION OF PROSTATE N/A 07/02/2020   Procedure: TRANSURETHRAL RESECTION OF THE PROSTATE (TURP);  Surgeon: Carolee Sherwood JONETTA DOUGLAS, MD;  Location:  [facility] ORS;  Service: Urology;  Laterality: N/A;   Patient Active Problem List   Diagnosis Date Noted   Renal mass, right 04/27/2023   Hypercoagulable state due to persistent atrial fibrillation (HCC) 08/02/2022   Parkinson's disease without dyskinesia or fluctuating manifestations (HCC) 12/21/2021   BPH (benign prostatic hyperplasia) 07/02/2020   Myelopathy (HCC) 04/26/2020   Atrial flutter (HCC) 12/19/2016   S/P shoulder replacement, left 06/15/2016   A-fib (HCC) 11/30/2015   HTN (hypertension)    Hypercholesterolemia    Deafness in left ear    Atrial fibrillation (HCC)    Hypopotassemia     ONSET DATE: March 2025 REFERRING DIAG:  G20.A1 (ICD-10-CM) - Parkinson's disease without dyskinesia or fluctuating manifestations (HCC)   THERAPY DIAG:  Parkinson's disease without dyskinesia or fluctuating manifestations (HCC)  Muscle weakness (generalized)  Unsteadiness on feet  Difficulty in walking, not elsewhere classified  Rationale for Evaluation and Treatment: Rehabilitation  SUBJECTIVE:                                                                                                                                                                                       SUBJECTIVE STATEMENT: Not much new since Tuesday. Reports that he was told that he is not supposed to take NSAIDs for his pain. Reports that he went to concert last week which was very hard on him- has balance, dizziness, and wife had to hold him up.    EVAL: Was found to have an issue with this kidney and had surgery in March 2025 and has had decline since recovering from that procedure and has had increasing weakness, balance problems, and reduced endurance since that time. Spouse notes decreased speed in mobility, increased tripping, and freezing of gait.  Reports using stationary bike at home most days and balance exercises at home. Not able to walk outdoors as frequently due to weather and busy traffic around home.   Pt accompanied by: self  PERTINENT HISTORY: Parkinsons Disease, diagnosed November, 2023. History of cervical myelopathy from severe cervical stenosis at C5-C6             -Status post surgical intervention by Dr. Onetha in March,  2022. History of vestibular schwannoma             -Status post surgical intervention/removal many years ago (30+), but MRI in March, 2022 demonstrated 23 x 16 x 14 mm mass, consistent with vestibular schwannoma - left total shoulder replacement PAIN:  Are you having pain? Pt reports a lot of chronic pain in LB, R foot, R knee   PRECAUTIONS: Fall  WEIGHT BEARING RESTRICTIONS: No  FALLS: Has patient fallen in last 6 months? No  LIVING ENVIRONMENT: Lives with: lives with their spouse Lives in: House/apartment Stairs: a few steps to enter. Deck in back of home w/ HR Has following equipment at home: occasional use of cane  PLOF: Independent with basic ADLs  PATIENT GOALS: improve balance and stamina, get back to where I was a year ago  OBJECTIVE:      TODAY'S TREATMENT: 09/06/23 Activity Comments   Pre: 111/86 mmHg, 73bpm, RPE 6/20  Post: 122/84 mmHg, 84bpm, RPE 12/20  Pt completed 1037 ft without AD   review of HEP: shoulder red ER TB 10x STS with arm swing 10x SLR 5x eaach  sidelying hip ABD Some cueing and demo required to improve coordination with STS. Cueing and correction required for strengthening. More evident quad lag on R LE     PATIENT EDUCATION: Education details: advised pt to discuss with his Dr. about trying muscle rubs or patches for pain d/t c/o uncontrolled pain, answered questions Person educated: Patient, spouse  Education method: Explanation, Demonstration, Tactile cues, and Verbal cues Education comprehension: verbalized understanding and returned demonstration    HOME EXERCISE PROGRAM: Provided handout for cycling HIIT to progress for Pedaling for PD protocol  Access Code: [email or web address] URL:  [email or web address] Date: 09/04/2023 Prepared by: Tawni Ferrier  Program Notes n/a  Exercises - Shoulder External Rotation and Scapular Retraction  - 1-2 x daily - 5 x weekly - 2 sets - 10 reps - Sit to Stand with Arm Swing  - 1-2 x daily - 5 x weekly - 2 sets - 10 reps - Active Straight Leg Raise with Quad Set  - 1-2 x daily - 5 x weekly - 2 sets - 10 reps - Sidelying Hip Abduction  - 1-2 x daily - 5 x weekly - 2 sets - 10 reps  ________________________________________________________________________________________ Note: Objective measures were completed at Evaluation unless otherwise noted.  DIAGNOSTIC FINDINGS:   COGNITION: Overall cognitive status: Within functional limits for tasks assessed   SENSATION: Not tested  COORDINATION: WFL  POSTURE: forward head  LOWER EXTREMITY ROM:    WFL  LOWER EXTREMITY MMT:   Grossly 4/5 to seated resisted tests  BED MOBILITY:  indep  TRANSFERS: independent  CURB:  Findings: independent  STAIRS: Not tested GAIT: Findings: Distance walked:   and Comments: WFL, deficits during turning R vs L  FUNCTIONAL TESTS:  5 times sit to stand: 10 sec 10 meter walk test: 11.84 sec = 2.7 ft/sec  Mini-BESTest: 19/28 3 meter backwards walk test: 8.3 sec    _______________________________________________________________________________________  GOALS: Goals reviewed with patient? Yes  SHORT TERM GOALS: Target date: 09/17/2023   Patient will be independent in HEP to improve functional outcomes Baseline: Goal status: INITIAL  2.  Demo improved postural stability per mild-mod sway x 30 sec condition 4 M-CTSIB to improve safety with ADL Baseline: mod-severe x 10 sec Goal status: INITIAL  LONG TERM GOALS: Target date: 10/08/2023   Patient to ambulate at least 1500 ft during in order to improve  functional endurance.  Baseline: 1037 ft 09/06/23 Goal status: IN PROGRESS 09/06/23  2.  Demo improved balance,  motor control, and reduced risk for falls per score 24/28 Mini-BESTest Baseline: 19/28 Goal status: INITIAL  3.  Demo reduced risk for falls per time 4.8 sec 3 meter Backwards Walk Test Baseline: 8.3 sec Goal status: INITIAL  4.  Report improved activity tolerance as evidenced by ability to participate in community activities, e.g. shopping, exercise class, outings with friends, etc Baseline:  Goal status: INITIAL  5.  Pt to report improved thoracic pain as to not limiting standing ADL/housekeeping tasks to improve activity tolerance  Baseline: 6/10 thoracic pain with prolonged standing  Goal status: INITIAL ASSESSMENT:  CLINICAL IMPRESSION: Patient arrived to session with report of chronic pain without management d/t instruction from his MD. Thus, advised pt to discuss pain management options at home. Patient completed with limitations in endurance, evident- goal has been set. Reviewed HEP for max benefit and carryover, providing corrective cueing as needed. Patient tolerated session well and without complaints upon leaving.   EVAL: Patient is a 68 y.o. male who was seen today for physical therapy evaluation and treatment for Parkinson's Disease.  Pt reports remote hx of surgery and experiencing functional decline since that time w/ decrease in strength, activity tolerance, balance, and gait deviations.  Demonstrates difficulty with postural control and balance as evidenced by score 19/28 Mini-BESTest indicating increased risk for falls as well as with .  Pt reports activity limitations not able to ambulate community-level distances due to fatigue and balance issues. Would benefit from PT services to address deficits and limitations to improve functional mobility and reduce risk for falls.    OBJECTIVE IMPAIRMENTS: decreased activity tolerance, decreased balance, decreased endurance, decreased knowledge of use of DME, difficulty walking, decreased strength, and pain.   PLAN:  PT  FREQUENCY: 1-2x/week  PT DURATION: 6 weeks  PLANNED INTERVENTIONS: 97750- Physical Performance Testing, 97110-Therapeutic exercises, 97530- Therapeutic activity, [id number]- Neuromuscular re-education, 97535- Self Care, [id number]- Manual therapy, [id number]- Gait training, [phone]- Orthotic Initial, [phone]- Orthotic/Prosthetic subsequent, [phone]- Canalith repositioning, and [phone]- Aquatic Therapy  PLAN FOR NEXT SESSION: LE strengthening, HEP review-- plan to shift back towards past HEP (from prior episode), treadmill training, bike HIIT for home performance, LE strengthening, postural training, PWR! Exercises.  Louana Terrilyn Christians, PT, DPT 09/06/23 11:49 AM  [facility] [address] [place], [address] [place], [place] [address] Phone # [phone] Fax # [phone]

## 2023-09-06 ENCOUNTER — Ambulatory Visit: Admitting: Physical Therapy

## 2023-09-06 ENCOUNTER — Encounter: Payer: Self-pay | Admitting: Physical Therapy

## 2023-09-06 DIAGNOSIS — R278 Other lack of coordination: Secondary | ICD-10-CM | POA: Diagnosis not present

## 2023-09-06 DIAGNOSIS — G20A1 Parkinson's disease without dyskinesia, without mention of fluctuations: Secondary | ICD-10-CM | POA: Diagnosis not present

## 2023-09-06 DIAGNOSIS — R262 Difficulty in walking, not elsewhere classified: Secondary | ICD-10-CM | POA: Diagnosis not present

## 2023-09-06 DIAGNOSIS — M6281 Muscle weakness (generalized): Secondary | ICD-10-CM

## 2023-09-06 DIAGNOSIS — R2681 Unsteadiness on feet: Secondary | ICD-10-CM

## 2023-09-06 DIAGNOSIS — R29818 Other symptoms and signs involving the nervous system: Secondary | ICD-10-CM | POA: Diagnosis not present

## 2023-09-10 NOTE — Therapy (Signed)
 OUTPATIENT PHYSICAL THERAPY NEURO TREATMENT  Patient Name: Patrick Gray MRN: [id number] DOB:27-Aug-1955, 68 y.o., male Today's Date: 09/11/2023  PCP: Seabron Lenis, MD REFERRING PROVIDER: Evonnie Asberry RAMAN, DO  END OF SESSION:  PT End of Session - 09/11/23 1103     Visit Number 4    Number of Visits 12    Date for PT Re-Evaluation 10/08/23    Authorization Type [employer]    Authorization Time Period approved 12 PT visits from 08/27/2023-10/08/2023    PT Start Time 1020    PT Stop Time 1059    PT Time Calculation (min) 39 min    Equipment Utilized During Treatment Gait belt    Activity Tolerance Patient tolerated treatment well    Behavior During Therapy WFL for tasks assessed/performed            Past Medical History:  Diagnosis Date   Acoustic neuroma (HCC)    left   Anxiety    Arthritis    Complication of anesthesia    Delusion, violent, hallucination, hospitalized 8 days, urine retention   Coronary artery disease    Deafness in left ear    Delusion (HCC)    after fall on pain meds and prednisone   Dysrhythmia    PAF   Foley catheter in place    H/O cardiac radiofrequency ablation    HTN (hypertension)    Hypercholesterolemia    Hypopotassemia    Insomnia    Parkinson's disease (HCC)    Paroxysmal atrial fibrillation (HCC)    Radiculopathy    after fall   Tremor    left hand   Vitamin B 12 deficiency    Past Surgical History:  Procedure Laterality Date   ACOUSTIC NEUROMA RESECTION Left 19990   ANTERIOR CERVICAL DECOMP/DISCECTOMY FUSION N/A 04/26/2020   Procedure: Anterior Cervical Decompression Fusion - Cervical five-Cervical six - Cervical six-Cervical seven;  Surgeon: Onetha Kuba, MD;  Location: [facility] OR;  Service: Neurosurgery;  Laterality: N/A;   APPENDECTOMY  1976   ATRIAL FIBRILLATION ABLATION N/A 12/19/2016   Procedure: ATRIAL FIBRILLATION ABLATION;  Surgeon: Kelsie Agent, MD;  Location: [facility] INVASIVE CV LAB;  Service: Cardiovascular;  Laterality:  N/A;   CARDIAC CATHETERIZATION N/A 10/07/2015   Procedure: Left Heart Cath and Coronary Angiography;  Surgeon: Maude JAYSON Emmer, MD;  Location: [facility] INVASIVE CV LAB;  Service: Cardiovascular;  Laterality: N/A;   CARDIOVERSION N/A 06/17/2013   Procedure: CARDIOVERSION;  Surgeon: Maude JAYSON Emmer, MD;  Location: [facility] ENDOSCOPY;  Service: Cardiovascular;  Laterality: N/A;   CARDIOVERSION N/A 05/11/2016   Procedure: CARDIOVERSION;  Surgeon: Jerel Balding, MD;  Location: [facility] ENDOSCOPY;  Service: Cardiovascular;  Laterality: N/A;   CARDIOVERSION N/A 05/24/2017   Procedure: CARDIOVERSION;  Surgeon: Shlomo Wilbert SAUNDERS, MD;  Location: [facility] ENDOSCOPY;  Service: Cardiovascular;  Laterality: N/A;   CARDIOVERSION N/A 11/19/2017   Procedure: CARDIOVERSION;  Surgeon: Mona Vinie JAYSON, MD;  Location: [facility] ENDOSCOPY;  Service: Cardiovascular;  Laterality: N/A;   CHOLECYSTECTOMY  1976   CLUB FOOT RELEASE  1968   ELECTROPHYSIOLOGIC STUDY N/A 11/30/2015   Procedure: Atrial Fibrillation Ablation;  Surgeon: Agent Kelsie, MD;  Location: [facility] INVASIVE CV LAB;  Service: Cardiovascular;  Laterality: N/A;   ROBOT ASSISTED LAPAROSCOPIC NEPHRECTOMY Right 04/27/2023   Procedure: XI RIGHT RETROPERITONEAL ROBOTIC ASSISTED LAPAROSCOPIC RADICAL NEPHRECTOMY;  Surgeon: Alvaro Ricardo KATHEE Mickey., MD;  Location: [facility] ORS;  Service: Urology;  Laterality: Right;  180 MINUTES NEEDED FOR CASE   TEE WITHOUT CARDIOVERSION N/A 05/11/2016  Procedure: TRANSESOPHAGEAL ECHOCARDIOGRAM (TEE);  Surgeon: Jerel Balding, MD;  Location: [facility] ENDOSCOPY;  Service: Cardiovascular;  Laterality: N/A;   TONSILLECTOMY  1967   TOTAL SHOULDER ARTHROPLASTY Left 06/15/2016   Procedure: TOTAL SHOULDER ARTHROPLASTY;  Surgeon: Eva Herring, MD;  Location: [facility] OR;  Service: Orthopedics;  Laterality: Left;  Left total shoulder arthroplasty   TRANSURETHRAL RESECTION OF PROSTATE N/A 07/02/2020   Procedure: TRANSURETHRAL RESECTION OF THE PROSTATE (TURP);  Surgeon: Carolee Sherwood JONETTA DOUGLAS, MD;  Location:  [facility] ORS;  Service: Urology;  Laterality: N/A;   Patient Active Problem List   Diagnosis Date Noted   Renal mass, right 04/27/2023   Hypercoagulable state due to persistent atrial fibrillation (HCC) 08/02/2022   Parkinson's disease without dyskinesia or fluctuating manifestations (HCC) 12/21/2021   BPH (benign prostatic hyperplasia) 07/02/2020   Myelopathy (HCC) 04/26/2020   Atrial flutter (HCC) 12/19/2016   S/P shoulder replacement, left 06/15/2016   A-fib (HCC) 11/30/2015   HTN (hypertension)    Hypercholesterolemia    Deafness in left ear    Atrial fibrillation (HCC)    Hypopotassemia     ONSET DATE: March 2025 REFERRING DIAG:  G20.A1 (ICD-10-CM) - Parkinson's disease without dyskinesia or fluctuating manifestations (HCC)   THERAPY DIAG:  Parkinson's disease without dyskinesia or fluctuating manifestations (HCC)  Muscle weakness (generalized)  Unsteadiness on feet  Difficulty in walking, not elsewhere classified  Rationale for Evaluation and Treatment: Rehabilitation  SUBJECTIVE:                                                                                                                                                                                       SUBJECTIVE STATEMENT: I'm feeling a little off kilter today. Had a really good day a few days ago. Reports having some difficulty maintaining speed on his bike because the seat is uncomfortable.    EVAL: Was found to have an issue with this kidney and had surgery in March 2025 and has had decline since recovering from that procedure and has had increasing weakness, balance problems, and reduced endurance since that time. Spouse notes decreased speed in mobility, increased tripping, and freezing of gait.  Reports using stationary bike at home most days and balance exercises at home. Not able to walk outdoors as frequently due to weather and busy traffic around home.  Pt accompanied by: self  PERTINENT HISTORY: Parkinsons  Disease, diagnosed November, 2023. History of cervical myelopathy from severe cervical stenosis at C5-C6             -Status post surgical intervention by Dr. Onetha in March, 2022. History of vestibular schwannoma             -  Status post surgical intervention/removal many years ago (30+), but MRI in March, 2022 demonstrated 23 x 16 x 14 mm mass, consistent with vestibular schwannoma - left total shoulder replacement PAIN:  Are you having pain? Pt reports a lot of chronic pain in LB, R foot, R knee   PRECAUTIONS: Fall  WEIGHT BEARING RESTRICTIONS: No  FALLS: Has patient fallen in last 6 months? No  LIVING ENVIRONMENT: Lives with: lives with their spouse Lives in: House/apartment Stairs: a few steps to enter. Deck in back of home w/ HR Has following equipment at home: occasional use of cane  PLOF: Independent with basic ADLs  PATIENT GOALS: improve balance and stamina, get back to where I was a year ago  OBJECTIVE:     TODAY'S TREATMENT: 09/11/23 Activity Comments  Scifit UEs/LEs only L5.0 x 6 min  Cues to maintain ~60RPM- pt was unable to maintain. Dynamic warm up and neural priming  standing PWR moves:PREPARE and ACTIVATE  Up 10x Rock 10x Twist 10x Step 10x In II bars,modified PWR up d/t limited shoulder ROM. Cues to look ahead, reset at midline each time   standing alt backwards step At TM rail; cues to look straight ahead. Tendency for L LOB with good ability to self-ecover  sidestepping TB loop around ankles At TM rail   sidestepping over hurdles Cueing for safe foot placement to allow enough room for each good. Weaned to no UE support with good performance      PATIENT EDUCATION: Education details: edu on use of music or metronome to maintain pacing during cardio; handout on PWR moves to be performed near counter top at home  Person educated: Patient Education method: Programmer, multimedia, Demonstration, Actor cues, Verbal cues, and Handouts Education comprehension:  verbalized understanding and returned demonstration    HOME EXERCISE PROGRAM: Provided handout for cycling HIIT to progress for Pedaling for PD protocol  Access Code: [email or web address] URL: [email or web address] Date: 09/04/2023 Prepared by: Tawni Ferrier  Program Notes n/a  Exercises - Shoulder External Rotation and Scapular Retraction  - 1-2 x daily - 5 x weekly - 2 sets - 10 reps - Sit to Stand with Arm Swing  - 1-2 x daily - 5 x weekly - 2 sets - 10 reps - Active Straight Leg Raise with Quad Set  - 1-2 x daily - 5 x weekly - 2 sets - 10 reps - Sidelying Hip Abduction  - 1-2 x daily - 5 x weekly - 2 sets - 10 reps  ________________________________________________________________________________________ Note: Objective measures were completed at Evaluation unless otherwise noted.  DIAGNOSTIC FINDINGS:   COGNITION: Overall cognitive status: Within functional limits for tasks assessed   SENSATION: Not tested  COORDINATION: WFL  POSTURE: forward head  LOWER EXTREMITY ROM:    WFL  LOWER EXTREMITY MMT:   Grossly 4/5 to seated resisted tests  BED MOBILITY:  indep  TRANSFERS: independent  CURB:  Findings: independent  STAIRS: Not tested GAIT: Findings: Distance walked:   and Comments: WFL, deficits during turning R vs L  FUNCTIONAL TESTS:  5 times sit to stand: 10 sec 10 meter walk test: 11.84 sec = 2.7 ft/sec  Mini-BESTest: 19/28 3 meter backwards walk test: 8.3 sec    _______________________________________________________________________________________  GOALS: Goals reviewed with patient? Yes  SHORT TERM GOALS: Target date: 09/17/2023   Patient will be independent in HEP to improve functional outcomes Baseline: Goal status: INITIAL  2.  Demo improved postural stability per mild-mod sway x 30 sec condition 4 M-CTSIB to improve safety  with ADL Baseline: mod-severe x 10 sec Goal status: INITIAL  LONG TERM GOALS: Target date:  10/08/2023   Patient to ambulate at least 1500 ft during in order to improve functional endurance.  Baseline: 1037 ft 09/06/23 Goal status: IN PROGRESS 09/06/23  2.  Demo improved balance, motor control, and reduced risk for falls per score 24/28 Mini-BESTest Baseline: 19/28 Goal status: INITIAL  3.  Demo reduced risk for falls per time 4.8 sec 3 meter Backwards Walk Test Baseline: 8.3 sec Goal status: INITIAL  4.  Report improved activity tolerance as evidenced by ability to participate in community activities, e.g. shopping, exercise class, outings with friends, etc Baseline:  Goal status: INITIAL  5.  Pt to report improved thoracic pain as to not limiting standing ADL/housekeeping tasks to improve activity tolerance  Baseline: 6/10 thoracic pain with prolonged standing  Goal status: INITIAL ASSESSMENT:  CLINICAL IMPRESSION: Patient arrived to session with report of some imbalance today. Patient performed standing PWR moves with focus on adequate weight shift, amplitude of movement, and completeness of movement. Patient was responsive to corrective cueing. Standing balance activities focused on enhancing step length for safety and obstacle clearance. Patient tolerated session well and without complaints upon leaving.   EVAL: Patient is a 68 y.o. male who was seen today for physical therapy evaluation and treatment for Parkinson's Disease.  Pt reports remote hx of surgery and experiencing functional decline since that time w/ decrease in strength, activity tolerance, balance, and gait deviations.  Demonstrates difficulty with postural control and balance as evidenced by score 19/28 Mini-BESTest indicating increased risk for falls as well as with .  Pt reports activity limitations not able to ambulate community-level distances due to fatigue and balance issues. Would benefit from PT services to address deficits and limitations to improve functional mobility and reduce risk for falls.     OBJECTIVE IMPAIRMENTS: decreased activity tolerance, decreased balance, decreased endurance, decreased knowledge of use of DME, difficulty walking, decreased strength, and pain.   PLAN:  PT FREQUENCY: 1-2x/week  PT DURATION: 6 weeks  PLANNED INTERVENTIONS: 97750- Physical Performance Testing, 97110-Therapeutic exercises, 97530- Therapeutic activity, [id number]- Neuromuscular re-education, 97535- Self Care, [id number]- Manual therapy, [id number]- Gait training, [phone]- Orthotic Initial, [phone]- Orthotic/Prosthetic subsequent, [phone]- Canalith repositioning, and [phone]- Aquatic Therapy  PLAN FOR NEXT SESSION: review standing PWR moves; LE strengthening, HEP review-- plan to shift back towards past HEP (from prior episode), treadmill training, bike HIIT for home performance, LE strengthening, postural training, PWR! Exercises.  Louana Terrilyn Christians, PT, DPT 09/11/23 11:04 AM  [facility] at [facility] [address] [place], [address] [place], [place] [address] Phone # [phone] Fax # [phone]

## 2023-09-11 ENCOUNTER — Encounter: Payer: Self-pay | Admitting: Physical Therapy

## 2023-09-11 ENCOUNTER — Ambulatory Visit: Admitting: Physical Therapy

## 2023-09-11 DIAGNOSIS — R2681 Unsteadiness on feet: Secondary | ICD-10-CM

## 2023-09-11 DIAGNOSIS — M6281 Muscle weakness (generalized): Secondary | ICD-10-CM | POA: Diagnosis not present

## 2023-09-11 DIAGNOSIS — R262 Difficulty in walking, not elsewhere classified: Secondary | ICD-10-CM | POA: Diagnosis not present

## 2023-09-11 DIAGNOSIS — G20A1 Parkinson's disease without dyskinesia, without mention of fluctuations: Secondary | ICD-10-CM | POA: Diagnosis not present

## 2023-09-11 DIAGNOSIS — R29818 Other symptoms and signs involving the nervous system: Secondary | ICD-10-CM | POA: Diagnosis not present

## 2023-09-11 DIAGNOSIS — R278 Other lack of coordination: Secondary | ICD-10-CM | POA: Diagnosis not present

## 2023-09-13 ENCOUNTER — Ambulatory Visit

## 2023-09-13 DIAGNOSIS — G20A1 Parkinson's disease without dyskinesia, without mention of fluctuations: Secondary | ICD-10-CM

## 2023-09-13 DIAGNOSIS — M6281 Muscle weakness (generalized): Secondary | ICD-10-CM | POA: Diagnosis not present

## 2023-09-13 DIAGNOSIS — R2681 Unsteadiness on feet: Secondary | ICD-10-CM

## 2023-09-13 DIAGNOSIS — R278 Other lack of coordination: Secondary | ICD-10-CM | POA: Diagnosis not present

## 2023-09-13 DIAGNOSIS — R262 Difficulty in walking, not elsewhere classified: Secondary | ICD-10-CM

## 2023-09-13 DIAGNOSIS — R29818 Other symptoms and signs involving the nervous system: Secondary | ICD-10-CM | POA: Diagnosis not present

## 2023-09-13 NOTE — Therapy (Signed)
 OUTPATIENT PHYSICAL THERAPY NEURO TREATMENT  Patient Name: Patrick Gray MRN: [id number] DOB:October 16, 1955, 68 y.o., male Today's Date: 09/13/2023  PCP: Seabron Lenis, MD REFERRING PROVIDER: Evonnie Asberry RAMAN, DO  END OF SESSION:  PT End of Session - 09/13/23 1106     Visit Number 5    Number of Visits 12    Date for PT Re-Evaluation 10/08/23    Authorization Type [employer]    Authorization Time Period approved 12 PT visits from 08/27/2023-10/08/2023    PT Start Time 1105    PT Stop Time 1145    PT Time Calculation (min) 40 min    Equipment Utilized During Treatment Gait belt    Activity Tolerance Patient tolerated treatment well    Behavior During Therapy WFL for tasks assessed/performed            Past Medical History:  Diagnosis Date   Acoustic neuroma (HCC)    left   Anxiety    Arthritis    Complication of anesthesia    Delusion, violent, hallucination, hospitalized 8 days, urine retention   Coronary artery disease    Deafness in left ear    Delusion (HCC)    after fall on pain meds and prednisone   Dysrhythmia    PAF   Foley catheter in place    H/O cardiac radiofrequency ablation    HTN (hypertension)    Hypercholesterolemia    Hypopotassemia    Insomnia    Parkinson's disease (HCC)    Paroxysmal atrial fibrillation (HCC)    Radiculopathy    after fall   Tremor    left hand   Vitamin B 12 deficiency    Past Surgical History:  Procedure Laterality Date   ACOUSTIC NEUROMA RESECTION Left 19990   ANTERIOR CERVICAL DECOMP/DISCECTOMY FUSION N/A 04/26/2020   Procedure: Anterior Cervical Decompression Fusion - Cervical five-Cervical six - Cervical six-Cervical seven;  Surgeon: Onetha Kuba, MD;  Location: [facility] OR;  Service: Neurosurgery;  Laterality: N/A;   APPENDECTOMY  1976   ATRIAL FIBRILLATION ABLATION N/A 12/19/2016   Procedure: ATRIAL FIBRILLATION ABLATION;  Surgeon: Kelsie Agent, MD;  Location: [facility] INVASIVE CV LAB;  Service: Cardiovascular;  Laterality:  N/A;   CARDIAC CATHETERIZATION N/A 10/07/2015   Procedure: Left Heart Cath and Coronary Angiography;  Surgeon: Maude JAYSON Emmer, MD;  Location: [facility] INVASIVE CV LAB;  Service: Cardiovascular;  Laterality: N/A;   CARDIOVERSION N/A 06/17/2013   Procedure: CARDIOVERSION;  Surgeon: Maude JAYSON Emmer, MD;  Location: [facility] ENDOSCOPY;  Service: Cardiovascular;  Laterality: N/A;   CARDIOVERSION N/A 05/11/2016   Procedure: CARDIOVERSION;  Surgeon: Jerel Balding, MD;  Location: [facility] ENDOSCOPY;  Service: Cardiovascular;  Laterality: N/A;   CARDIOVERSION N/A 05/24/2017   Procedure: CARDIOVERSION;  Surgeon: Shlomo Wilbert SAUNDERS, MD;  Location: [facility] ENDOSCOPY;  Service: Cardiovascular;  Laterality: N/A;   CARDIOVERSION N/A 11/19/2017   Procedure: CARDIOVERSION;  Surgeon: Mona Vinie JAYSON, MD;  Location: [facility] ENDOSCOPY;  Service: Cardiovascular;  Laterality: N/A;   CHOLECYSTECTOMY  1976   CLUB FOOT RELEASE  1968   ELECTROPHYSIOLOGIC STUDY N/A 11/30/2015   Procedure: Atrial Fibrillation Ablation;  Surgeon: Agent Kelsie, MD;  Location: [facility] INVASIVE CV LAB;  Service: Cardiovascular;  Laterality: N/A;   ROBOT ASSISTED LAPAROSCOPIC NEPHRECTOMY Right 04/27/2023   Procedure: XI RIGHT RETROPERITONEAL ROBOTIC ASSISTED LAPAROSCOPIC RADICAL NEPHRECTOMY;  Surgeon: Alvaro Ricardo KATHEE Mickey., MD;  Location: [facility] ORS;  Service: Urology;  Laterality: Right;  180 MINUTES NEEDED FOR CASE   TEE WITHOUT CARDIOVERSION N/A 05/11/2016  Procedure: TRANSESOPHAGEAL ECHOCARDIOGRAM (TEE);  Surgeon: Jerel Balding, MD;  Location: [facility] ENDOSCOPY;  Service: Cardiovascular;  Laterality: N/A;   TONSILLECTOMY  1967   TOTAL SHOULDER ARTHROPLASTY Left 06/15/2016   Procedure: TOTAL SHOULDER ARTHROPLASTY;  Surgeon: Eva Herring, MD;  Location: [facility] OR;  Service: Orthopedics;  Laterality: Left;  Left total shoulder arthroplasty   TRANSURETHRAL RESECTION OF PROSTATE N/A 07/02/2020   Procedure: TRANSURETHRAL RESECTION OF THE PROSTATE (TURP);  Surgeon: Carolee Sherwood JONETTA DOUGLAS, MD;  Location:  [facility] ORS;  Service: Urology;  Laterality: N/A;   Patient Active Problem List   Diagnosis Date Noted   Renal mass, right 04/27/2023   Hypercoagulable state due to persistent atrial fibrillation (HCC) 08/02/2022   Parkinson's disease without dyskinesia or fluctuating manifestations (HCC) 12/21/2021   BPH (benign prostatic hyperplasia) 07/02/2020   Myelopathy (HCC) 04/26/2020   Atrial flutter (HCC) 12/19/2016   S/P shoulder replacement, left 06/15/2016   A-fib (HCC) 11/30/2015   HTN (hypertension)    Hypercholesterolemia    Deafness in left ear    Atrial fibrillation (HCC)    Hypopotassemia     ONSET DATE: March 2025 REFERRING DIAG:  G20.A1 (ICD-10-CM) - Parkinson's disease without dyskinesia or fluctuating manifestations (HCC)   THERAPY DIAG:  Parkinson's disease without dyskinesia or fluctuating manifestations (HCC)  Muscle weakness (generalized)  Unsteadiness on feet  Difficulty in walking, not elsewhere classified  Other symptoms and signs involving the nervous system  Rationale for Evaluation and Treatment: Rehabilitation  SUBJECTIVE:                                                                                                                                                                                       SUBJECTIVE STATEMENT: Feeling better today.    EVAL: Was found to have an issue with this kidney and had surgery in March 2025 and has had decline since recovering from that procedure and has had increasing weakness, balance problems, and reduced endurance since that time. Spouse notes decreased speed in mobility, increased tripping, and freezing of gait.  Reports using stationary bike at home most days and balance exercises at home. Not able to walk outdoors as frequently due to weather and busy traffic around home.  Pt accompanied by: self  PERTINENT HISTORY: Parkinsons Disease, diagnosed November, 2023. History of cervical myelopathy from severe cervical  stenosis at C5-C6             -Status post surgical intervention by Dr. Onetha in March, 2022. History of vestibular schwannoma             -Status post surgical intervention/removal many years ago (30+), but MRI in March, 2022 demonstrated 23  x 16 x 14 mm mass, consistent with vestibular schwannoma - left total shoulder replacement PAIN:  Are you having pain? Pt reports a lot of chronic pain in LB, R foot, R knee   PRECAUTIONS: Fall  WEIGHT BEARING RESTRICTIONS: No  FALLS: Has patient fallen in last 6 months? No  LIVING ENVIRONMENT: Lives with: lives with their spouse Lives in: House/apartment Stairs: a few steps to enter. Deck in back of home w/ HR Has following equipment at home: occasional use of cane  PLOF: Independent with basic ADLs  PATIENT GOALS: improve balance and stamina, get back to where I was a year ago  OBJECTIVE:    TODAY'S TREATMENT: 09/13/23 Activity Comments  Standing PWR moves   Sidestepping w/ green loop x 2 min   balance -high step forward march x 2 min--reciprocal arm swing -retrowalk x 2 min cues for step height -reactive and proactive balance strategies on firm/foam surfaces -push-release   Gait training -techniques to facilitate reciprocal arm swing (difficulty with LUE)           TODAY'S TREATMENT: 09/11/23 Activity Comments  Scifit UEs/LEs only L5.0 x 6 min  Cues to maintain ~60RPM- pt was unable to maintain. Dynamic warm up and neural priming  standing PWR moves:PREPARE and ACTIVATE  Up 10x Rock 10x Twist 10x Step 10x In II bars,modified PWR up d/t limited shoulder ROM. Cues to look ahead, reset at midline each time   standing alt backwards step At TM rail; cues to look straight ahead. Tendency for L LOB with good ability to self-ecover  sidestepping TB loop around ankles At TM rail   sidestepping over hurdles Cueing for safe foot placement to allow enough room for each good. Weaned to no UE support with good performance      PATIENT  EDUCATION: Education details: edu on use of music or metronome to maintain pacing during cardio; handout on PWR moves to be performed near counter top at home  Person educated: Patient Education method: Programmer, multimedia, Demonstration, Actor cues, Verbal cues, and Handouts Education comprehension: verbalized understanding and returned demonstration    HOME EXERCISE PROGRAM: Provided handout for cycling HIIT to progress for Pedaling for PD protocol  Access Code: [email or web address] URL: [email or web address] Date: 09/04/2023 Prepared by: Tawni Ferrier  Program Notes n/a  Exercises - Shoulder External Rotation and Scapular Retraction  - 1-2 x daily - 5 x weekly - 2 sets - 10 reps - Sit to Stand with Arm Swing  - 1-2 x daily - 5 x weekly - 2 sets - 10 reps - Active Straight Leg Raise with Quad Set  - 1-2 x daily - 5 x weekly - 2 sets - 10 reps - Sidelying Hip Abduction  - 1-2 x daily - 5 x weekly - 2 sets - 10 reps - Side Stepping with Resistance at Thighs and Counter Support  - 1 x daily - 7 x weekly - 1-2 sets - 2 min round hold  ________________________________________________________________________________________ Note: Objective measures were completed at Evaluation unless otherwise noted.  DIAGNOSTIC FINDINGS:   COGNITION: Overall cognitive status: Within functional limits for tasks assessed   SENSATION: Not tested  COORDINATION: WFL  POSTURE: forward head  LOWER EXTREMITY ROM:    WFL  LOWER EXTREMITY MMT:   Grossly 4/5 to seated resisted tests  BED MOBILITY:  indep  TRANSFERS: independent  CURB:  Findings: independent  STAIRS: Not tested GAIT: Findings: Distance walked:   and Comments: WFL, deficits during turning R vs L  FUNCTIONAL  TESTS:  5 times sit to stand: 10 sec 10 meter walk test: 11.84 sec = 2.7 ft/sec  Mini-BESTest: 19/28 3 meter backwards walk test: 8.3 sec     _______________________________________________________________________________________  GOALS: Goals reviewed with patient? Yes  SHORT TERM GOALS: Target date: 09/17/2023   Patient will be independent in HEP to improve functional outcomes Baseline: Goal status: INITIAL  2.  Demo improved postural stability per mild-mod sway x 30 sec condition 4 M-CTSIB to improve safety with ADL Baseline: mod-severe x 10 sec Goal status: INITIAL  LONG TERM GOALS: Target date: 10/08/2023   Patient to ambulate at least 1500 ft during in order to improve functional endurance.  Baseline: 1037 ft 09/06/23 Goal status: IN PROGRESS 09/06/23  2.  Demo improved balance, motor control, and reduced risk for falls per score 24/28 Mini-BESTest Baseline: 19/28 Goal status: INITIAL  3.  Demo reduced risk for falls per time 4.8 sec 3 meter Backwards Walk Test Baseline: 8.3 sec Goal status: INITIAL  4.  Report improved activity tolerance as evidenced by ability to participate in community activities, e.g. shopping, exercise class, outings with friends, etc Baseline:  Goal status: INITIAL  5.  Pt to report improved thoracic pain as to not limiting standing ADL/housekeeping tasks to improve activity tolerance  Baseline: 6/10 thoracic pain with prolonged standing  Goal status: INITIAL ASSESSMENT:  CLINICAL IMPRESSION: Patient arrived to session with report of some imbalance today. Patient performed standing PWR moves with focus on adequate weight shift, amplitude of movement, and completeness of movement. Neuro re-ed for facilitation to right hip abduction/single limb support followed by balance activities to reactive and proactive balance strategies with good execution of righting reactions for push-release with single large step for correction.  Difficulty when on compliant surfaces and with divided attention with tendency for retro-LOB with delayed reaction. Limited reciprocal arm swing LUE during  ambulation.  Continued sessions to advance POC details to improve mobility and reduce risk for falls  EVAL: Patient is a 68 y.o. male who was seen today for physical therapy evaluation and treatment for Parkinson's Disease.  Pt reports remote hx of surgery and experiencing functional decline since that time w/ decrease in strength, activity tolerance, balance, and gait deviations.  Demonstrates difficulty with postural control and balance as evidenced by score 19/28 Mini-BESTest indicating increased risk for falls as well as with .  Pt reports activity limitations not able to ambulate community-level distances due to fatigue and balance issues. Would benefit from PT services to address deficits and limitations to improve functional mobility and reduce risk for falls.    OBJECTIVE IMPAIRMENTS: decreased activity tolerance, decreased balance, decreased endurance, decreased knowledge of use of DME, difficulty walking, decreased strength, and pain.   PLAN:  PT FREQUENCY: 1-2x/week  PT DURATION: 6 weeks  PLANNED INTERVENTIONS: 97750- Physical Performance Testing, 97110-Therapeutic exercises, 97530- Therapeutic activity, [id number]- Neuromuscular re-education, 97535- Self Care, [id number]- Manual therapy, [id number]- Gait training, [phone]- Orthotic Initial, [phone]- Orthotic/Prosthetic subsequent, [phone]- Canalith repositioning, and [phone]- Aquatic Therapy  PLAN FOR NEXT SESSION: review standing PWR moves; LE strengthening, HEP review-- plan to shift back towards past HEP (from prior episode), treadmill training, bike HIIT for home performance, LE strengthening, postural training, PWR! Exercises.  Light cuff weight on LUE for arm swing  12:50 PM, 09/13/23 M. Kelly Joaovictor Krone, PT, DPT Physical Therapist- [place] Office Number: [phone]

## 2023-09-18 ENCOUNTER — Ambulatory Visit: Admitting: Physical Therapy

## 2023-09-18 ENCOUNTER — Encounter: Payer: Self-pay | Admitting: Physical Therapy

## 2023-09-18 DIAGNOSIS — G20A1 Parkinson's disease without dyskinesia, without mention of fluctuations: Secondary | ICD-10-CM | POA: Diagnosis not present

## 2023-09-18 DIAGNOSIS — R278 Other lack of coordination: Secondary | ICD-10-CM | POA: Diagnosis not present

## 2023-09-18 DIAGNOSIS — R29818 Other symptoms and signs involving the nervous system: Secondary | ICD-10-CM

## 2023-09-18 DIAGNOSIS — M6281 Muscle weakness (generalized): Secondary | ICD-10-CM

## 2023-09-18 DIAGNOSIS — R2681 Unsteadiness on feet: Secondary | ICD-10-CM | POA: Diagnosis not present

## 2023-09-18 DIAGNOSIS — R262 Difficulty in walking, not elsewhere classified: Secondary | ICD-10-CM | POA: Diagnosis not present

## 2023-09-18 NOTE — Therapy (Signed)
 OUTPATIENT PHYSICAL THERAPY NEURO TREATMENT  Patient Name: Patrick Gray MRN: [id number] DOB:04/19/1955, 68 y.o., male Today's Date: 09/18/2023  PCP: Seabron Lenis, MD REFERRING PROVIDER: Evonnie Asberry RAMAN, DO  END OF SESSION:  PT End of Session - 09/18/23 1107     Visit Number 6    Number of Visits 12    Date for PT Re-Evaluation 10/08/23    Authorization Type [employer]    Authorization Time Period approved 12 PT visits from 08/27/2023-10/08/2023    PT Start Time 1104    PT Stop Time 1142    PT Time Calculation (min) 38 min    Equipment Utilized During Treatment Gait belt    Activity Tolerance Patient tolerated treatment well    Behavior During Therapy WFL for tasks assessed/performed             Past Medical History:  Diagnosis Date   Acoustic neuroma (HCC)    left   Anxiety    Arthritis    Complication of anesthesia    Delusion, violent, hallucination, hospitalized 8 days, urine retention   Coronary artery disease    Deafness in left ear    Delusion (HCC)    after fall on pain meds and prednisone   Dysrhythmia    PAF   Foley catheter in place    H/O cardiac radiofrequency ablation    HTN (hypertension)    Hypercholesterolemia    Hypopotassemia    Insomnia    Parkinson's disease (HCC)    Paroxysmal atrial fibrillation (HCC)    Radiculopathy    after fall   Tremor    left hand   Vitamin B 12 deficiency    Past Surgical History:  Procedure Laterality Date   ACOUSTIC NEUROMA RESECTION Left 19990   ANTERIOR CERVICAL DECOMP/DISCECTOMY FUSION N/A 04/26/2020   Procedure: Anterior Cervical Decompression Fusion - Cervical five-Cervical six - Cervical six-Cervical seven;  Surgeon: Onetha Kuba, MD;  Location: [facility] OR;  Service: Neurosurgery;  Laterality: N/A;   APPENDECTOMY  1976   ATRIAL FIBRILLATION ABLATION N/A 12/19/2016   Procedure: ATRIAL FIBRILLATION ABLATION;  Surgeon: Kelsie Agent, MD;  Location: [facility] INVASIVE CV LAB;  Service: Cardiovascular;   Laterality: N/A;   CARDIAC CATHETERIZATION N/A 10/07/2015   Procedure: Left Heart Cath and Coronary Angiography;  Surgeon: Maude JAYSON Emmer, MD;  Location: [facility] INVASIVE CV LAB;  Service: Cardiovascular;  Laterality: N/A;   CARDIOVERSION N/A 06/17/2013   Procedure: CARDIOVERSION;  Surgeon: Maude JAYSON Emmer, MD;  Location: [facility] ENDOSCOPY;  Service: Cardiovascular;  Laterality: N/A;   CARDIOVERSION N/A 05/11/2016   Procedure: CARDIOVERSION;  Surgeon: Jerel Balding, MD;  Location: [facility] ENDOSCOPY;  Service: Cardiovascular;  Laterality: N/A;   CARDIOVERSION N/A 05/24/2017   Procedure: CARDIOVERSION;  Surgeon: Shlomo Wilbert SAUNDERS, MD;  Location: [facility] ENDOSCOPY;  Service: Cardiovascular;  Laterality: N/A;   CARDIOVERSION N/A 11/19/2017   Procedure: CARDIOVERSION;  Surgeon: Mona Vinie JAYSON, MD;  Location: [facility] ENDOSCOPY;  Service: Cardiovascular;  Laterality: N/A;   CHOLECYSTECTOMY  1976   CLUB FOOT RELEASE  1968   ELECTROPHYSIOLOGIC STUDY N/A 11/30/2015   Procedure: Atrial Fibrillation Ablation;  Surgeon: Agent Kelsie, MD;  Location: [facility] INVASIVE CV LAB;  Service: Cardiovascular;  Laterality: N/A;   ROBOT ASSISTED LAPAROSCOPIC NEPHRECTOMY Right 04/27/2023   Procedure: XI RIGHT RETROPERITONEAL ROBOTIC ASSISTED LAPAROSCOPIC RADICAL NEPHRECTOMY;  Surgeon: Alvaro Ricardo KATHEE Mickey., MD;  Location: [facility] ORS;  Service: Urology;  Laterality: Right;  180 MINUTES NEEDED FOR CASE   TEE WITHOUT CARDIOVERSION N/A 05/11/2016  Procedure: TRANSESOPHAGEAL ECHOCARDIOGRAM (TEE);  Surgeon: Jerel Balding, MD;  Location: [facility] ENDOSCOPY;  Service: Cardiovascular;  Laterality: N/A;   TONSILLECTOMY  1967   TOTAL SHOULDER ARTHROPLASTY Left 06/15/2016   Procedure: TOTAL SHOULDER ARTHROPLASTY;  Surgeon: Eva Herring, MD;  Location: [facility] OR;  Service: Orthopedics;  Laterality: Left;  Left total shoulder arthroplasty   TRANSURETHRAL RESECTION OF PROSTATE N/A 07/02/2020   Procedure: TRANSURETHRAL RESECTION OF THE PROSTATE (TURP);  Surgeon: Carolee Sherwood JONETTA DOUGLAS,  MD;  Location: [facility] ORS;  Service: Urology;  Laterality: N/A;   Patient Active Problem List   Diagnosis Date Noted   Renal mass, right 04/27/2023   Hypercoagulable state due to persistent atrial fibrillation (HCC) 08/02/2022   Parkinson's disease without dyskinesia or fluctuating manifestations (HCC) 12/21/2021   BPH (benign prostatic hyperplasia) 07/02/2020   Myelopathy (HCC) 04/26/2020   Atrial flutter (HCC) 12/19/2016   S/P shoulder replacement, left 06/15/2016   A-fib (HCC) 11/30/2015   HTN (hypertension)    Hypercholesterolemia    Deafness in left ear    Atrial fibrillation (HCC)    Hypopotassemia     ONSET DATE: March 2025 REFERRING DIAG:  G20.A1 (ICD-10-CM) - Parkinson's disease without dyskinesia or fluctuating manifestations (HCC)   THERAPY DIAG:  Parkinson's disease without dyskinesia or fluctuating manifestations (HCC)  Muscle weakness (generalized)  Unsteadiness on feet  Difficulty in walking, not elsewhere classified  Other symptoms and signs involving the nervous system  Other lack of coordination  Rationale for Evaluation and Treatment: Rehabilitation  SUBJECTIVE:                                                                                                                                                                                       SUBJECTIVE STATEMENT: Pt states he has been about the same since last treatment. No falls. Feels at his average this morning.   EVAL: Was found to have an issue with this kidney and had surgery in March 2025 and has had decline since recovering from that procedure and has had increasing weakness, balance problems, and reduced endurance since that time. Spouse notes decreased speed in mobility, increased tripping, and freezing of gait.  Reports using stationary bike at home most days and balance exercises at home. Not able to walk outdoors as frequently due to weather and busy traffic around home.  Pt accompanied by:  self  PERTINENT HISTORY: Parkinsons Disease, diagnosed November, 2023. History of cervical myelopathy from severe cervical stenosis at C5-C6             -Status post surgical intervention by Dr. Onetha in March, 2022. History of vestibular schwannoma             -  Status post surgical intervention/removal many years ago (30+), but MRI in March, 2022 demonstrated 23 x 16 x 14 mm mass, consistent with vestibular schwannoma - left total shoulder replacement PAIN:  Are you having pain? Pt reports a lot of chronic pain in LB, R foot, R knee   PRECAUTIONS: Fall  WEIGHT BEARING RESTRICTIONS: No  FALLS: Has patient fallen in last 6 months? No  LIVING ENVIRONMENT: Lives with: lives with their spouse Lives in: House/apartment Stairs: a few steps to enter. Deck in back of home w/ HR Has following equipment at home: occasional use of cane  PLOF: Independent with basic ADLs  PATIENT GOALS: improve balance and stamina, get back to where I was a year ago  OBJECTIVE:     TODAY'S TREATMENT: 09/18/2023 Activity Comments  Scifit UEs/LEs only L6.0 x 8 min  Cues to maintain ~50RPM- pt was unable to maintain. Dynamic warm up and neural priming  standing PWR moves:PREPARE and ACTIVATE  Up 10x Rock 10x Twist 10x Step 10x In II bars,modified PWR up d/t limited shoulder ROM. Cues to look ahead, reset at midline each time   Side stepping green TB around ankles x 5 laps In // bars  Fwd/bwd monster walk green TB around ankles x 5 laps In // bars. Cues to increase L backwards step  Fwd step up/down on to airex foam beam with reciprocal arm motions x10 Cueing required, slowed speed with reciprocal arm movements        PATIENT EDUCATION: Education details: edu on use of music or metronome to maintain pacing during cardio; handout on PWR moves to be performed near counter top at home  Person educated: Patient Education method: Programmer, multimedia, Demonstration, Tactile cues, Verbal cues, and Handouts Education  comprehension: verbalized understanding and returned demonstration    HOME EXERCISE PROGRAM: Provided handout for cycling HIIT to progress for Pedaling for PD protocol  Access Code: [email or web address] URL: [email or web address] Date: 09/04/2023 Prepared by: Tawni Ferrier  Program Notes n/a  Exercises - Shoulder External Rotation and Scapular Retraction  - 1-2 x daily - 5 x weekly - 2 sets - 10 reps - Sit to Stand with Arm Swing  - 1-2 x daily - 5 x weekly - 2 sets - 10 reps - Active Straight Leg Raise with Quad Set  - 1-2 x daily - 5 x weekly - 2 sets - 10 reps - Sidelying Hip Abduction  - 1-2 x daily - 5 x weekly - 2 sets - 10 reps - Side Stepping with Resistance at Thighs and Counter Support  - 1 x daily - 7 x weekly - 1-2 sets - 2 min round hold  ________________________________________________________________________________________ Note: Objective measures were completed at Evaluation unless otherwise noted.  DIAGNOSTIC FINDINGS:   COGNITION: Overall cognitive status: Within functional limits for tasks assessed   SENSATION: Not tested  COORDINATION: WFL  POSTURE: forward head  LOWER EXTREMITY ROM:    WFL  LOWER EXTREMITY MMT:   Grossly 4/5 to seated resisted tests  BED MOBILITY:  indep  TRANSFERS: independent  CURB:  Findings: independent  STAIRS: Not tested GAIT: Findings: Distance walked:   and Comments: WFL, deficits during turning R vs L  FUNCTIONAL TESTS:  5 times sit to stand: 10 sec 10 meter walk test: 11.84 sec = 2.7 ft/sec  Mini-BESTest: 19/28 3 meter backwards walk test: 8.3 sec    _______________________________________________________________________________________  GOALS: Goals reviewed with patient? Yes  SHORT TERM GOALS: Target date: 09/17/2023   Patient will be independent in  HEP to improve functional outcomes Baseline: Goal status: INITIAL  2.  Demo improved postural stability per mild-mod sway x 30 sec condition  4 M-CTSIB to improve safety with ADL Baseline: mod-severe x 10 sec Goal status: INITIAL  LONG TERM GOALS: Target date: 10/08/2023   Patient to ambulate at least 1500 ft during in order to improve functional endurance.  Baseline: 1037 ft 09/06/23 Goal status: IN PROGRESS 09/06/23  2.  Demo improved balance, motor control, and reduced risk for falls per score 24/28 Mini-BESTest Baseline: 19/28 Goal status: INITIAL  3.  Demo reduced risk for falls per time 4.8 sec 3 meter Backwards Walk Test Baseline: 8.3 sec Goal status: INITIAL  4.  Report improved activity tolerance as evidenced by ability to participate in community activities, e.g. shopping, exercise class, outings with friends, etc Baseline:  Goal status: INITIAL  5.  Pt to report improved thoracic pain as to not limiting standing ADL/housekeeping tasks to improve activity tolerance  Baseline: 6/10 thoracic pain with prolonged standing  Goal status: INITIAL ASSESSMENT:  CLINICAL IMPRESSION: Pt has been working on [employer]! Moves at home. Reviewed them again today. Discussed trying to perform them now in sequence to try and improve overall coordination. Highly challenged with performing reciprocal arm movements with stepping up/down on to airex foam beam.   EVAL: Patient is a 68 y.o. male who was seen today for physical therapy evaluation and treatment for Parkinson's Disease.  Pt reports remote hx of surgery and experiencing functional decline since that time w/ decrease in strength, activity tolerance, balance, and gait deviations.  Demonstrates difficulty with postural control and balance as evidenced by score 19/28 Mini-BESTest indicating increased risk for falls as well as with .  Pt reports activity limitations not able to ambulate community-level distances due to fatigue and balance issues. Would benefit from PT services to address deficits and limitations to improve functional mobility and reduce risk for falls.     OBJECTIVE IMPAIRMENTS: decreased activity tolerance, decreased balance, decreased endurance, decreased knowledge of use of DME, difficulty walking, decreased strength, and pain.   PLAN:  PT FREQUENCY: 1-2x/week  PT DURATION: 6 weeks  PLANNED INTERVENTIONS: 97750- Physical Performance Testing, 97110-Therapeutic exercises, 97530- Therapeutic activity, [id number]- Neuromuscular re-education, 97535- Self Care, [id number]- Manual therapy, [id number]- Gait training, [phone]- Orthotic Initial, [phone]- Orthotic/Prosthetic subsequent, [phone]- Canalith repositioning, and [phone]- Aquatic Therapy  PLAN FOR NEXT SESSION: review standing PWR moves; LE strengthening, HEP review-- plan to shift back towards past HEP (from prior episode), treadmill training, bike HIIT for home performance, LE strengthening, postural training, PWR! Exercises.  Light cuff weight on LUE for arm swing  11:07 AM, 09/18/23 Jillana Selph April Ma L Flossie Wexler, PT, DPT Physical Therapist- [place] Office Number: [phone]

## 2023-09-19 NOTE — Therapy (Signed)
 OUTPATIENT PHYSICAL THERAPY NEURO TREATMENT  Patient Name: Patrick Gray MRN: [id number] DOB:09/21/1955, 68 y.o., male Today's Date: 09/20/2023  PCP: Seabron Lenis, MD REFERRING PROVIDER: Evonnie Asberry RAMAN, DO  END OF SESSION:  PT End of Session - 09/20/23 1145     Visit Number 7    Number of Visits 12    Date for PT Re-Evaluation 10/08/23    Authorization Type [employer]    Authorization Time Period approved 12 PT visits from 08/27/2023-10/08/2023    PT Start Time 1059    PT Stop Time 1143    PT Time Calculation (min) 44 min    Equipment Utilized During Treatment Gait belt    Activity Tolerance Patient tolerated treatment well    Behavior During Therapy WFL for tasks assessed/performed              Past Medical History:  Diagnosis Date   Acoustic neuroma (HCC)    left   Anxiety    Arthritis    Complication of anesthesia    Delusion, violent, hallucination, hospitalized 8 days, urine retention   Coronary artery disease    Deafness in left ear    Delusion (HCC)    after fall on pain meds and prednisone   Dysrhythmia    PAF   Foley catheter in place    H/O cardiac radiofrequency ablation    HTN (hypertension)    Hypercholesterolemia    Hypopotassemia    Insomnia    Parkinson's disease (HCC)    Paroxysmal atrial fibrillation (HCC)    Radiculopathy    after fall   Tremor    left hand   Vitamin B 12 deficiency    Past Surgical History:  Procedure Laterality Date   ACOUSTIC NEUROMA RESECTION Left 19990   ANTERIOR CERVICAL DECOMP/DISCECTOMY FUSION N/A 04/26/2020   Procedure: Anterior Cervical Decompression Fusion - Cervical five-Cervical six - Cervical six-Cervical seven;  Surgeon: Onetha Kuba, MD;  Location: [facility] OR;  Service: Neurosurgery;  Laterality: N/A;   APPENDECTOMY  1976   ATRIAL FIBRILLATION ABLATION N/A 12/19/2016   Procedure: ATRIAL FIBRILLATION ABLATION;  Surgeon: Kelsie Agent, MD;  Location: [facility] INVASIVE CV LAB;  Service: Cardiovascular;   Laterality: N/A;   CARDIAC CATHETERIZATION N/A 10/07/2015   Procedure: Left Heart Cath and Coronary Angiography;  Surgeon: Maude JAYSON Emmer, MD;  Location: [facility] INVASIVE CV LAB;  Service: Cardiovascular;  Laterality: N/A;   CARDIOVERSION N/A 06/17/2013   Procedure: CARDIOVERSION;  Surgeon: Maude JAYSON Emmer, MD;  Location: [facility] ENDOSCOPY;  Service: Cardiovascular;  Laterality: N/A;   CARDIOVERSION N/A 05/11/2016   Procedure: CARDIOVERSION;  Surgeon: Jerel Balding, MD;  Location: [facility] ENDOSCOPY;  Service: Cardiovascular;  Laterality: N/A;   CARDIOVERSION N/A 05/24/2017   Procedure: CARDIOVERSION;  Surgeon: Shlomo Wilbert SAUNDERS, MD;  Location: [facility] ENDOSCOPY;  Service: Cardiovascular;  Laterality: N/A;   CARDIOVERSION N/A 11/19/2017   Procedure: CARDIOVERSION;  Surgeon: Mona Vinie JAYSON, MD;  Location: [facility] ENDOSCOPY;  Service: Cardiovascular;  Laterality: N/A;   CHOLECYSTECTOMY  1976   CLUB FOOT RELEASE  1968   ELECTROPHYSIOLOGIC STUDY N/A 11/30/2015   Procedure: Atrial Fibrillation Ablation;  Surgeon: Agent Kelsie, MD;  Location: [facility] INVASIVE CV LAB;  Service: Cardiovascular;  Laterality: N/A;   ROBOT ASSISTED LAPAROSCOPIC NEPHRECTOMY Right 04/27/2023   Procedure: XI RIGHT RETROPERITONEAL ROBOTIC ASSISTED LAPAROSCOPIC RADICAL NEPHRECTOMY;  Surgeon: Alvaro Ricardo KATHEE Mickey., MD;  Location: [facility] ORS;  Service: Urology;  Laterality: Right;  180 MINUTES NEEDED FOR CASE   TEE WITHOUT CARDIOVERSION N/A 05/11/2016  Procedure: TRANSESOPHAGEAL ECHOCARDIOGRAM (TEE);  Surgeon: Jerel Balding, MD;  Location: [facility] ENDOSCOPY;  Service: Cardiovascular;  Laterality: N/A;   TONSILLECTOMY  1967   TOTAL SHOULDER ARTHROPLASTY Left 06/15/2016   Procedure: TOTAL SHOULDER ARTHROPLASTY;  Surgeon: Eva Herring, MD;  Location: [facility] OR;  Service: Orthopedics;  Laterality: Left;  Left total shoulder arthroplasty   TRANSURETHRAL RESECTION OF PROSTATE N/A 07/02/2020   Procedure: TRANSURETHRAL RESECTION OF THE PROSTATE (TURP);  Surgeon: Carolee Sherwood JONETTA DOUGLAS,  MD;  Location: [facility] ORS;  Service: Urology;  Laterality: N/A;   Patient Active Problem List   Diagnosis Date Noted   Renal mass, right 04/27/2023   Hypercoagulable state due to persistent atrial fibrillation (HCC) 08/02/2022   Parkinson's disease without dyskinesia or fluctuating manifestations (HCC) 12/21/2021   BPH (benign prostatic hyperplasia) 07/02/2020   Myelopathy (HCC) 04/26/2020   Atrial flutter (HCC) 12/19/2016   S/P shoulder replacement, left 06/15/2016   A-fib (HCC) 11/30/2015   HTN (hypertension)    Hypercholesterolemia    Deafness in left ear    Atrial fibrillation (HCC)    Hypopotassemia     ONSET DATE: March 2025 REFERRING DIAG:  G20.A1 (ICD-10-CM) - Parkinson's disease without dyskinesia or fluctuating manifestations (HCC)   THERAPY DIAG:  Parkinson's disease without dyskinesia or fluctuating manifestations (HCC)  Muscle weakness (generalized)  Unsteadiness on feet  Difficulty in walking, not elsewhere classified  Rationale for Evaluation and Treatment: Rehabilitation  SUBJECTIVE:                                                                                                                                                                                       SUBJECTIVE STATEMENT: Doing okay. Pain is about average.   EVAL: Was found to have an issue with this kidney and had surgery in March 2025 and has had decline since recovering from that procedure and has had increasing weakness, balance problems, and reduced endurance since that time. Spouse notes decreased speed in mobility, increased tripping, and freezing of gait.  Reports using stationary bike at home most days and balance exercises at home. Not able to walk outdoors as frequently due to weather and busy traffic around home.  Pt accompanied by: self, wife   PERTINENT HISTORY: Parkinsons Disease, diagnosed November, 2023. History of cervical myelopathy from severe cervical stenosis at C5-C6              -Status post surgical intervention by Dr. Onetha in March, 2022. History of vestibular schwannoma             -Status post surgical intervention/removal many years ago (30+), but MRI in March, 2022 demonstrated 23 x 16 x 14 mm  mass, consistent with vestibular schwannoma - left total shoulder replacement PAIN:  Are you having pain? Pt reports a lot of chronic pain in LB, R foot, R knee   PRECAUTIONS: Fall  WEIGHT BEARING RESTRICTIONS: No  FALLS: Has patient fallen in last 6 months? No  LIVING ENVIRONMENT: Lives with: lives with their spouse Lives in: House/apartment Stairs: a few steps to enter. Deck in back of home w/ HR Has following equipment at home: occasional use of cane  PLOF: Independent with basic ADLs  PATIENT GOALS: improve balance and stamina, get back to where I was a year ago  OBJECTIVE:     TODAY'S TREATMENT: 09/20/23 Activity Comments  gait training on treadmill with cues for increased step length 1.40mph x2 min, 1.8 mph x 3 min, 2.0 mph x 1 min, 1.3 mph 1 min  Cueing for wider BOS and to use sound of foot catching on belt as auditory cue to increase R step height/foot clearance. B UE support   gait training with cues for arm swing. techniques include holding 5# in opposite arm and use of walking poles with PT AAROM Good response to techniques- pt demonstrates longer steps, quicker speed, and improved UE swing   standing PWR up with red TB for posture Mirror feedback. Eye boost and cueing for wider hand positioning to increase participation in UEs   standing PWR twist with red TB for posture Mirror feedback. Cues to reset at midline and pivot on toe. C/o popping in the R shoulder thus discontinued   Sidestepping red TB loop around ankles CGA; cues for upright posture, neutral hips. Performed with knees straight to avoid R knee pain            HOME EXERCISE PROGRAM: Provided handout for cycling HIIT to progress for Pedaling for PD protocol  Access Code:  [email or web address] URL: [email or web address] Date: 09/04/2023 Prepared by: Tawni Ferrier  Program Notes n/a  Exercises - Shoulder External Rotation and Scapular Retraction  - 1-2 x daily - 5 x weekly - 2 sets - 10 reps - Sit to Stand with Arm Swing  - 1-2 x daily - 5 x weekly - 2 sets - 10 reps - Active Straight Leg Raise with Quad Set  - 1-2 x daily - 5 x weekly - 2 sets - 10 reps - Sidelying Hip Abduction  - 1-2 x daily - 5 x weekly - 2 sets - 10 reps - Side Stepping with Resistance at Thighs and Counter Support  - 1 x daily - 7 x weekly - 1-2 sets - 2 min round hold  ________________________________________________________________________________________ Note: Objective measures were completed at Evaluation unless otherwise noted.  DIAGNOSTIC FINDINGS:   COGNITION: Overall cognitive status: Within functional limits for tasks assessed   SENSATION: Not tested  COORDINATION: WFL  POSTURE: forward head  LOWER EXTREMITY ROM:    WFL  LOWER EXTREMITY MMT:   Grossly 4/5 to seated resisted tests  BED MOBILITY:  indep  TRANSFERS: independent  CURB:  Findings: independent  STAIRS: Not tested GAIT: Findings: Distance walked:   and Comments: WFL, deficits during turning R vs L  FUNCTIONAL TESTS:  5 times sit to stand: 10 sec 10 meter walk test: 11.84 sec = 2.7 ft/sec  Mini-BESTest: 19/28 3 meter backwards walk test: 8.3 sec    _______________________________________________________________________________________  GOALS: Goals reviewed with patient? Yes  SHORT TERM GOALS: Target date: 09/17/2023   Patient will be independent in HEP to improve functional outcomes Baseline: Goal status: INITIAL  2.  Demo improved postural stability per mild-mod sway x 30 sec condition 4 M-CTSIB to improve safety with ADL Baseline: mod-severe x 10 sec Goal status: INITIAL  LONG TERM GOALS: Target date: 10/08/2023   Patient to ambulate at least 1500 ft during in  order to improve functional endurance.  Baseline: 1037 ft 09/06/23 Goal status: IN PROGRESS 09/06/23  2.  Demo improved balance, motor control, and reduced risk for falls per score 24/28 Mini-BESTest Baseline: 19/28 Goal status: INITIAL  3.  Demo reduced risk for falls per time 4.8 sec 3 meter Backwards Walk Test Baseline: 8.3 sec Goal status: INITIAL  4.  Report improved activity tolerance as evidenced by ability to participate in community activities, e.g. shopping, exercise class, outings with friends, etc Baseline:  Goal status: INITIAL  5.  Pt to report improved thoracic pain as to not limiting standing ADL/housekeeping tasks to improve activity tolerance  Baseline: 6/10 thoracic pain with prolonged standing  Goal status: INITIAL ASSESSMENT:  CLINICAL IMPRESSION: Patient arrived to session without new complaints. Session focused on gait training for improved safety and efficacy. Patient responded very well to cueing for L reciprocal arm swing today. Standing balance activities focused on posterior chain activation and increased participation of UEs in order to encouraged more upright posture. Small modifications provided during session to avoid aggravating multiple areas of chronic pain. Patient tolerated session well and without complaints upon leaving.   EVAL: Patient is a 68 y.o. male who was seen today for physical therapy evaluation and treatment for Parkinson's Disease.  Pt reports remote hx of surgery and experiencing functional decline since that time w/ decrease in strength, activity tolerance, balance, and gait deviations.  Demonstrates difficulty with postural control and balance as evidenced by score 19/28 Mini-BESTest indicating increased risk for falls as well as with .  Pt reports activity limitations not able to ambulate community-level distances due to fatigue and balance issues. Would benefit from PT services to address deficits and limitations to improve functional  mobility and reduce risk for falls.    OBJECTIVE IMPAIRMENTS: decreased activity tolerance, decreased balance, decreased endurance, decreased knowledge of use of DME, difficulty walking, decreased strength, and pain.   PLAN:  PT FREQUENCY: 1-2x/week  PT DURATION: 6 weeks  PLANNED INTERVENTIONS: 97750- Physical Performance Testing, 97110-Therapeutic exercises, 97530- Therapeutic activity, [id number]- Neuromuscular re-education, 97535- Self Care, [id number]- Manual therapy, [id number]- Gait training, [phone]- Orthotic Initial, [phone]- Orthotic/Prosthetic subsequent, [phone]- Canalith repositioning, and [phone]- Aquatic Therapy  PLAN FOR NEXT SESSION: LE strengthening, HEP review-- plan to shift back towards past HEP (from prior episode), treadmill training, bike HIIT for home performance, LE strengthening, postural training, PWR! Exercises.  Light cuff weight on LUE for arm swing   Louana Terrilyn Christians, [place], DPT 09/20/23 11:48 AM  [facility] at [facility] [address] [place], [address] [place], [place] [address] Phone # [phone] Fax # [phone]

## 2023-09-20 ENCOUNTER — Ambulatory Visit: Admitting: Physical Therapy

## 2023-09-20 ENCOUNTER — Encounter: Payer: Self-pay | Admitting: Physical Therapy

## 2023-09-20 DIAGNOSIS — R2681 Unsteadiness on feet: Secondary | ICD-10-CM | POA: Diagnosis not present

## 2023-09-20 DIAGNOSIS — G20A1 Parkinson's disease without dyskinesia, without mention of fluctuations: Secondary | ICD-10-CM

## 2023-09-20 DIAGNOSIS — R278 Other lack of coordination: Secondary | ICD-10-CM | POA: Diagnosis not present

## 2023-09-20 DIAGNOSIS — R262 Difficulty in walking, not elsewhere classified: Secondary | ICD-10-CM | POA: Diagnosis not present

## 2023-09-20 DIAGNOSIS — M6281 Muscle weakness (generalized): Secondary | ICD-10-CM

## 2023-09-20 DIAGNOSIS — R29818 Other symptoms and signs involving the nervous system: Secondary | ICD-10-CM | POA: Diagnosis not present

## 2023-09-25 ENCOUNTER — Ambulatory Visit: Attending: Neurology | Admitting: Rehabilitative and Restorative Service Providers"

## 2023-09-25 ENCOUNTER — Encounter: Payer: Self-pay | Admitting: Rehabilitative and Restorative Service Providers"

## 2023-09-25 DIAGNOSIS — R262 Difficulty in walking, not elsewhere classified: Secondary | ICD-10-CM | POA: Insufficient documentation

## 2023-09-25 DIAGNOSIS — M6281 Muscle weakness (generalized): Secondary | ICD-10-CM | POA: Diagnosis not present

## 2023-09-25 DIAGNOSIS — G20A1 Parkinson's disease without dyskinesia, without mention of fluctuations: Secondary | ICD-10-CM | POA: Insufficient documentation

## 2023-09-25 DIAGNOSIS — R278 Other lack of coordination: Secondary | ICD-10-CM | POA: Diagnosis not present

## 2023-09-25 DIAGNOSIS — R2681 Unsteadiness on feet: Secondary | ICD-10-CM | POA: Insufficient documentation

## 2023-09-25 DIAGNOSIS — R29818 Other symptoms and signs involving the nervous system: Secondary | ICD-10-CM | POA: Insufficient documentation

## 2023-09-25 NOTE — Therapy (Signed)
 OUTPATIENT PHYSICAL THERAPY NEURO TREATMENT  Patient Name: Patrick Gray MRN: [id number] DOB:September 27, 1955, 68 y.o., male Today's Date: 09/25/2023  PCP: Seabron Lenis, MD REFERRING PROVIDER: Evonnie Asberry RAMAN, DO  END OF SESSION:  PT End of Session - 09/25/23 1106     Visit Number 8    Number of Visits 12    Date for PT Re-Evaluation 10/08/23    Authorization Type [employer]    Authorization Time Period approved 12 PT visits from 08/27/2023-10/08/2023    PT Start Time 1106    PT Stop Time 1145    PT Time Calculation (min) 39 min    Equipment Utilized During Treatment Gait belt    Activity Tolerance Patient tolerated treatment well    Behavior During Therapy WFL for tasks assessed/performed          Past Medical History:  Diagnosis Date   Acoustic neuroma (HCC)    left   Anxiety    Arthritis    Complication of anesthesia    Delusion, violent, hallucination, hospitalized 8 days, urine retention   Coronary artery disease    Deafness in left ear    Delusion (HCC)    after fall on pain meds and prednisone   Dysrhythmia    PAF   Foley catheter in place    H/O cardiac radiofrequency ablation    HTN (hypertension)    Hypercholesterolemia    Hypopotassemia    Insomnia    Parkinson's disease (HCC)    Paroxysmal atrial fibrillation (HCC)    Radiculopathy    after fall   Tremor    left hand   Vitamin B 12 deficiency    Past Surgical History:  Procedure Laterality Date   ACOUSTIC NEUROMA RESECTION Left 19990   ANTERIOR CERVICAL DECOMP/DISCECTOMY FUSION N/A 04/26/2020   Procedure: Anterior Cervical Decompression Fusion - Cervical five-Cervical six - Cervical six-Cervical seven;  Surgeon: Onetha Kuba, MD;  Location: [facility] OR;  Service: Neurosurgery;  Laterality: N/A;   APPENDECTOMY  1976   ATRIAL FIBRILLATION ABLATION N/A 12/19/2016   Procedure: ATRIAL FIBRILLATION ABLATION;  Surgeon: Kelsie Agent, MD;  Location: [facility] INVASIVE CV LAB;  Service: Cardiovascular;  Laterality: N/A;    CARDIAC CATHETERIZATION N/A 10/07/2015   Procedure: Left Heart Cath and Coronary Angiography;  Surgeon: Maude JAYSON Emmer, MD;  Location: [facility] INVASIVE CV LAB;  Service: Cardiovascular;  Laterality: N/A;   CARDIOVERSION N/A 06/17/2013   Procedure: CARDIOVERSION;  Surgeon: Maude JAYSON Emmer, MD;  Location: [facility] ENDOSCOPY;  Service: Cardiovascular;  Laterality: N/A;   CARDIOVERSION N/A 05/11/2016   Procedure: CARDIOVERSION;  Surgeon: Jerel Balding, MD;  Location: [facility] ENDOSCOPY;  Service: Cardiovascular;  Laterality: N/A;   CARDIOVERSION N/A 05/24/2017   Procedure: CARDIOVERSION;  Surgeon: Shlomo Wilbert SAUNDERS, MD;  Location: [facility] ENDOSCOPY;  Service: Cardiovascular;  Laterality: N/A;   CARDIOVERSION N/A 11/19/2017   Procedure: CARDIOVERSION;  Surgeon: Mona Vinie JAYSON, MD;  Location: [facility] ENDOSCOPY;  Service: Cardiovascular;  Laterality: N/A;   CHOLECYSTECTOMY  1976   CLUB FOOT RELEASE  1968   ELECTROPHYSIOLOGIC STUDY N/A 11/30/2015   Procedure: Atrial Fibrillation Ablation;  Surgeon: Agent Kelsie, MD;  Location: [facility] INVASIVE CV LAB;  Service: Cardiovascular;  Laterality: N/A;   ROBOT ASSISTED LAPAROSCOPIC NEPHRECTOMY Right 04/27/2023   Procedure: XI RIGHT RETROPERITONEAL ROBOTIC ASSISTED LAPAROSCOPIC RADICAL NEPHRECTOMY;  Surgeon: Alvaro Ricardo KATHEE Mickey., MD;  Location: [facility] ORS;  Service: Urology;  Laterality: Right;  180 MINUTES NEEDED FOR CASE   TEE WITHOUT CARDIOVERSION N/A 05/11/2016   Procedure: TRANSESOPHAGEAL  ECHOCARDIOGRAM (TEE);  Surgeon: Jerel Balding, MD;  Location: [facility] ENDOSCOPY;  Service: Cardiovascular;  Laterality: N/A;   TONSILLECTOMY  1967   TOTAL SHOULDER ARTHROPLASTY Left 06/15/2016   Procedure: TOTAL SHOULDER ARTHROPLASTY;  Surgeon: Eva Herring, MD;  Location: [facility] OR;  Service: Orthopedics;  Laterality: Left;  Left total shoulder arthroplasty   TRANSURETHRAL RESECTION OF PROSTATE N/A 07/02/2020   Procedure: TRANSURETHRAL RESECTION OF THE PROSTATE (TURP);  Surgeon: Carolee Sherwood JONETTA DOUGLAS, MD;  Location: [facility]  ORS;  Service: Urology;  Laterality: N/A;   Patient Active Problem List   Diagnosis Date Noted   Renal mass, right 04/27/2023   Hypercoagulable state due to persistent atrial fibrillation (HCC) 08/02/2022   Parkinson's disease without dyskinesia or fluctuating manifestations (HCC) 12/21/2021   BPH (benign prostatic hyperplasia) 07/02/2020   Myelopathy (HCC) 04/26/2020   Atrial flutter (HCC) 12/19/2016   S/P shoulder replacement, left 06/15/2016   A-fib (HCC) 11/30/2015   HTN (hypertension)    Hypercholesterolemia    Deafness in left ear    Atrial fibrillation (HCC)    Hypopotassemia     ONSET DATE: March 2025 REFERRING DIAG:  G20.A1 (ICD-10-CM) - Parkinson's disease without dyskinesia or fluctuating manifestations (HCC)   THERAPY DIAG:  Parkinson's disease without dyskinesia or fluctuating manifestations (HCC)  Muscle weakness (generalized)  Unsteadiness on feet  Difficulty in walking, not elsewhere classified  Other symptoms and signs involving the nervous system  Rationale for Evaluation and Treatment: Rehabilitation  SUBJECTIVE:                                                                                                                                                                                       SUBJECTIVE STATEMENT: The patient reports he is spending a good part of the day going through his exercises. He feels that weakness continues.  EVAL: Was found to have an issue with this kidney and had surgery in March 2025 and has had decline since recovering from that procedure and has had increasing weakness, balance problems, and reduced endurance since that time. Spouse notes decreased speed in mobility, increased tripping, and freezing of gait.  Reports using stationary bike at home most days and balance exercises at home. Not able to walk outdoors as frequently due to weather and busy traffic around home.  Pt accompanied by: self, wife   PERTINENT HISTORY:  Parkinsons Disease, diagnosed November, 2023. History of cervical myelopathy from severe cervical stenosis at C5-C6             -Status post surgical intervention by Dr. Onetha in March, 2022. History of vestibular schwannoma             -  Status post surgical intervention/removal many years ago (30+), but MRI in March, 2022 demonstrated 23 x 16 x 14 mm mass, consistent with vestibular schwannoma - left total shoulder replacement PAIN:  Are you having pain? Pt reports a lot of chronic pain in LB, R foot, R knee   PRECAUTIONS: Fall  WEIGHT BEARING RESTRICTIONS: No  FALLS: Has patient fallen in last 6 months? No  LIVING ENVIRONMENT: Lives with: lives with their spouse Lives in: House/apartment Stairs: a few steps to enter. Deck in back of home w/ HR Has following equipment at home: occasional use of cane  PLOF: Independent with basic ADLs  PATIENT GOALS: improve balance and stamina, get back to where I was a year ago  OBJECTIVE:   TODAY'S TREATMENT: 09/25/23 Activity Comments  Dyanmic balance  Marching with emphasis on arm swing x large amplitude movements.  Mini lunges with counter trunk rotation and CGA for support   Gait  With arm swing through tactile cues for facilitation. Had patient carry 5# weight in R UE, however this increased heavy hitting R foot during gait.   PWR! standing   PWR up x 10 reps in door frame to cue full extension using eye boost and finger cues  PWR! Twist x 10 reps in door frame with cues for hand position and to pivot on toe  PWR! Weight shift x 5 reps  PWR! Step x 5 reps    Standing  Trunk flexion with physioball for UE flexion as well   Sit<>stand  PWR! Up x 5 reps without UE support   Step ups   Lateral and anterior dec'ing UE support x 5 reps R and L sides  Stepping laterally R and L sides with head movement     HOME EXERCISE PROGRAM: Provided handout for cycling HIIT to progress for Pedaling for PD protocol  Access Code:  [email or web address] URL: [email or web address] Date: 09/04/2023 Prepared by: Tawni Ferrier  Program Notes n/a  Exercises - Shoulder External Rotation and Scapular Retraction  - 1-2 x daily - 5 x weekly - 2 sets - 10 reps - Sit to Stand with Arm Swing  - 1-2 x daily - 5 x weekly - 2 sets - 10 reps - Active Straight Leg Raise with Quad Set  - 1-2 x daily - 5 x weekly - 2 sets - 10 reps - Sidelying Hip Abduction  - 1-2 x daily - 5 x weekly - 2 sets - 10 reps - Side Stepping with Resistance at Thighs and Counter Support  - 1 x daily - 7 x weekly - 1-2 sets - 2 min round hold  ________________________________________________________________________________________ Note: Objective measures were completed at Evaluation unless otherwise noted.  DIAGNOSTIC FINDINGS:   COGNITION: Overall cognitive status: Within functional limits for tasks assessed   SENSATION: Not tested  COORDINATION: WFL  POSTURE: forward head  LOWER EXTREMITY ROM:    WFL  LOWER EXTREMITY MMT:   Grossly 4/5 to seated resisted tests  BED MOBILITY:  indep  TRANSFERS: independent  CURB:  Findings: independent  STAIRS: Not tested GAIT: Findings: Distance walked:   and Comments: WFL, deficits during turning R vs L  FUNCTIONAL TESTS:  5 times sit to stand: 10 sec 10 meter walk test: 11.84 sec = 2.7 ft/sec  Mini-BESTest: 19/28 3 meter backwards walk test: 8.3 sec   ______________________________________________________________________________________  GOALS: Goals reviewed with patient? Yes  SHORT TERM GOALS: Target date: 09/17/2023   Patient will be independent in HEP to improve functional outcomes Baseline:  Goal status: MET per reports  2.  Demo improved postural stability per mild-mod sway x 30 sec condition 4 M-CTSIB to improve safety with ADL Baseline: mod-severe x 10 sec Goal status: MET-- Can hold x 30 seconds on 09/25/23  LONG TERM GOALS: Target date: 10/08/2023   Patient to  ambulate at least 1500 ft during in order to improve functional endurance.  Baseline: 1037 ft 09/06/23 Goal status: IN PROGRESS 09/06/23  2.  Demo improved balance, motor control, and reduced risk for falls per score 24/28 Mini-BESTest Baseline: 19/28 Goal status: INITIAL  3.  Demo reduced risk for falls per time 4.8 sec 3 meter Backwards Walk Test Baseline: 8.3 sec Goal status: INITIAL  4.  Report improved activity tolerance as evidenced by ability to participate in community activities, e.g. shopping, exercise class, outings with friends, etc Baseline:  Goal status: INITIAL  5.  Pt to report improved thoracic pain as to not limiting standing ADL/housekeeping tasks to improve activity tolerance  Baseline: 6/10 thoracic pain with prolonged standing  Goal status: INITIAL ASSESSMENT:  CLINICAL IMPRESSION:   Patient met 2 STGs today. He is making progress in session with posture and tolerance to activity. PT to continue working towards LTGs focusing on dynamic balance, endurance, gait activities.  PT reduced repetitions of LE strengthening due to c/o R medial knee pain. Plan to monitor tolerance to activity. EVAL: Patient is a 68 y.o. male who was seen today for physical therapy evaluation and treatment for Parkinson's Disease.  Pt reports remote hx of surgery and experiencing functional decline since that time w/ decrease in strength, activity tolerance, balance, and gait deviations.  Demonstrates difficulty with postural control and balance as evidenced by score 19/28 Mini-BESTest indicating increased risk for falls as well as with .  Pt reports activity limitations not able to ambulate community-level distances due to fatigue and balance issues. Would benefit from PT services to address deficits and limitations to improve functional mobility and reduce risk for falls.    OBJECTIVE IMPAIRMENTS: decreased activity tolerance, decreased balance, decreased endurance, decreased knowledge  of use of DME, difficulty walking, decreased strength, and pain.   PLAN:  PT FREQUENCY: 1-2x/week  PT DURATION: 6 weeks  PLANNED INTERVENTIONS: 97750- Physical Performance Testing, 97110-Therapeutic exercises, 97530- Therapeutic activity, [id number]- Neuromuscular re-education, 97535- Self Care, [id number]- Manual therapy, [id number]- Gait training, [phone]- Orthotic Initial, [phone]- Orthotic/Prosthetic subsequent, [phone]- Canalith repositioning, and [phone]- Aquatic Therapy  PLAN FOR NEXT SESSION: LE strengthening, HEP review-- plan to shift back towards past HEP (from prior episode), treadmill training, bike HIIT for home performance, LE strengthening, postural training, PWR! Exercises.  Light cuff weight on LUE for arm swing  Ericca Labra, PT 09/25/23 1:04 PM [facility] at [facility] [address] [place], [address] [place], [place] [address] Phone # [phone] Fax # [phone]

## 2023-09-27 ENCOUNTER — Ambulatory Visit: Admitting: Physical Therapy

## 2023-09-27 ENCOUNTER — Encounter: Payer: Self-pay | Admitting: Physical Therapy

## 2023-09-27 DIAGNOSIS — R2681 Unsteadiness on feet: Secondary | ICD-10-CM

## 2023-09-27 DIAGNOSIS — M6281 Muscle weakness (generalized): Secondary | ICD-10-CM | POA: Diagnosis not present

## 2023-09-27 DIAGNOSIS — R262 Difficulty in walking, not elsewhere classified: Secondary | ICD-10-CM | POA: Diagnosis not present

## 2023-09-27 DIAGNOSIS — R29818 Other symptoms and signs involving the nervous system: Secondary | ICD-10-CM | POA: Diagnosis not present

## 2023-09-27 DIAGNOSIS — G20A1 Parkinson's disease without dyskinesia, without mention of fluctuations: Secondary | ICD-10-CM | POA: Diagnosis not present

## 2023-09-27 DIAGNOSIS — R278 Other lack of coordination: Secondary | ICD-10-CM | POA: Diagnosis not present

## 2023-09-27 NOTE — Patient Instructions (Addendum)
 BP MEASURES:  Sitting 108/74 Standing 87/43  Attempted orthostatic measures:  Supine 105/66 Standing after 1 min 69/54 with dizziness/lightheadedness *did not measure after 3 minutes due to BP above  Upon sitting, he reports dizziness resolves

## 2023-09-27 NOTE — Therapy (Signed)
 OUTPATIENT PHYSICAL THERAPY NEURO TREATMENT  Patient Name: Patrick Gray MRN: [id number] DOB:08/18/1955, 68 y.o., male Today's Date: 09/28/2023  PCP: Seabron Lenis, MD REFERRING PROVIDER: Evonnie Asberry RAMAN, DO  END OF SESSION:  PT End of Session - 09/27/23 1103     Visit Number 9    Number of Visits 12    Date for PT Re-Evaluation 10/08/23    Authorization Type [employer]    Authorization Time Period approved 12 PT visits from 08/27/2023-10/08/2023    PT Start Time 1105    PT Stop Time 1150    PT Time Calculation (min) 45 min    Equipment Utilized During Treatment Gait belt    Activity Tolerance Patient tolerated treatment well    Behavior During Therapy WFL for tasks assessed/performed           Past Medical History:  Diagnosis Date   Acoustic neuroma (HCC)    left   Anxiety    Arthritis    Complication of anesthesia    Delusion, violent, hallucination, hospitalized 8 days, urine retention   Coronary artery disease    Deafness in left ear    Delusion (HCC)    after fall on pain meds and prednisone   Dysrhythmia    PAF   Foley catheter in place    H/O cardiac radiofrequency ablation    HTN (hypertension)    Hypercholesterolemia    Hypopotassemia    Insomnia    Parkinson's disease (HCC)    Paroxysmal atrial fibrillation (HCC)    Radiculopathy    after fall   Tremor    left hand   Vitamin B 12 deficiency    Past Surgical History:  Procedure Laterality Date   ACOUSTIC NEUROMA RESECTION Left 19990   ANTERIOR CERVICAL DECOMP/DISCECTOMY FUSION N/A 04/26/2020   Procedure: Anterior Cervical Decompression Fusion - Cervical five-Cervical six - Cervical six-Cervical seven;  Surgeon: Onetha Kuba, MD;  Location: [facility] OR;  Service: Neurosurgery;  Laterality: N/A;   APPENDECTOMY  1976   ATRIAL FIBRILLATION ABLATION N/A 12/19/2016   Procedure: ATRIAL FIBRILLATION ABLATION;  Surgeon: Kelsie Agent, MD;  Location: [facility] INVASIVE CV LAB;  Service: Cardiovascular;  Laterality:  N/A;   CARDIAC CATHETERIZATION N/A 10/07/2015   Procedure: Left Heart Cath and Coronary Angiography;  Surgeon: Maude JAYSON Emmer, MD;  Location: [facility] INVASIVE CV LAB;  Service: Cardiovascular;  Laterality: N/A;   CARDIOVERSION N/A 06/17/2013   Procedure: CARDIOVERSION;  Surgeon: Maude JAYSON Emmer, MD;  Location: [facility] ENDOSCOPY;  Service: Cardiovascular;  Laterality: N/A;   CARDIOVERSION N/A 05/11/2016   Procedure: CARDIOVERSION;  Surgeon: Jerel Balding, MD;  Location: [facility] ENDOSCOPY;  Service: Cardiovascular;  Laterality: N/A;   CARDIOVERSION N/A 05/24/2017   Procedure: CARDIOVERSION;  Surgeon: Shlomo Wilbert SAUNDERS, MD;  Location: [facility] ENDOSCOPY;  Service: Cardiovascular;  Laterality: N/A;   CARDIOVERSION N/A 11/19/2017   Procedure: CARDIOVERSION;  Surgeon: Mona Vinie JAYSON, MD;  Location: [facility] ENDOSCOPY;  Service: Cardiovascular;  Laterality: N/A;   CHOLECYSTECTOMY  1976   CLUB FOOT RELEASE  1968   ELECTROPHYSIOLOGIC STUDY N/A 11/30/2015   Procedure: Atrial Fibrillation Ablation;  Surgeon: Agent Kelsie, MD;  Location: [facility] INVASIVE CV LAB;  Service: Cardiovascular;  Laterality: N/A;   ROBOT ASSISTED LAPAROSCOPIC NEPHRECTOMY Right 04/27/2023   Procedure: XI RIGHT RETROPERITONEAL ROBOTIC ASSISTED LAPAROSCOPIC RADICAL NEPHRECTOMY;  Surgeon: Alvaro Ricardo KATHEE Mickey., MD;  Location: [facility] ORS;  Service: Urology;  Laterality: Right;  180 MINUTES NEEDED FOR CASE   TEE WITHOUT CARDIOVERSION N/A 05/11/2016   Procedure:  TRANSESOPHAGEAL ECHOCARDIOGRAM (TEE);  Surgeon: Jerel Balding, MD;  Location: [facility] ENDOSCOPY;  Service: Cardiovascular;  Laterality: N/A;   TONSILLECTOMY  1967   TOTAL SHOULDER ARTHROPLASTY Left 06/15/2016   Procedure: TOTAL SHOULDER ARTHROPLASTY;  Surgeon: Eva Herring, MD;  Location: [facility] OR;  Service: Orthopedics;  Laterality: Left;  Left total shoulder arthroplasty   TRANSURETHRAL RESECTION OF PROSTATE N/A 07/02/2020   Procedure: TRANSURETHRAL RESECTION OF THE PROSTATE (TURP);  Surgeon: Carolee Sherwood JONETTA DOUGLAS, MD;  Location:  [facility] ORS;  Service: Urology;  Laterality: N/A;   Patient Active Problem List   Diagnosis Date Noted   Renal mass, right 04/27/2023   Hypercoagulable state due to persistent atrial fibrillation (HCC) 08/02/2022   Parkinson's disease without dyskinesia or fluctuating manifestations (HCC) 12/21/2021   BPH (benign prostatic hyperplasia) 07/02/2020   Myelopathy (HCC) 04/26/2020   Atrial flutter (HCC) 12/19/2016   S/P shoulder replacement, left 06/15/2016   A-fib (HCC) 11/30/2015   HTN (hypertension)    Hypercholesterolemia    Deafness in left ear    Atrial fibrillation (HCC)    Hypopotassemia     ONSET DATE: March 2025 REFERRING DIAG:  G20.A1 (ICD-10-CM) - Parkinson's disease without dyskinesia or fluctuating manifestations (HCC)   THERAPY DIAG:  Unsteadiness on feet  Muscle weakness (generalized)  Other symptoms and signs involving the nervous system  Rationale for Evaluation and Treatment: Rehabilitation  SUBJECTIVE:                                                                                                                                                                                       SUBJECTIVE STATEMENT: Slow but sure in the overall recovery.  Been working on [employer]! Moves and arm swing with walking.  EVAL: Was found to have an issue with this kidney and had surgery in March 2025 and has had decline since recovering from that procedure and has had increasing weakness, balance problems, and reduced endurance since that time. Spouse notes decreased speed in mobility, increased tripping, and freezing of gait.  Reports using stationary bike at home most days and balance exercises at home. Not able to walk outdoors as frequently due to weather and busy traffic around home.  Pt accompanied by: self, wife   PERTINENT HISTORY: Parkinsons Disease, diagnosed November, 2023. History of cervical myelopathy from severe cervical stenosis at C5-C6             -Status post surgical  intervention by Dr. Onetha in March, 2022. History of vestibular schwannoma             -Status post surgical intervention/removal many years ago (30+), but MRI in March, 2022 demonstrated 23 x 16  x 14 mm mass, consistent with vestibular schwannoma - left total shoulder replacement PAIN:  Are you having pain? Pt reports a lot of chronic pain in LB, R foot, R knee   PRECAUTIONS: Fall  WEIGHT BEARING RESTRICTIONS: No  FALLS: Has patient fallen in last 6 months? No  LIVING ENVIRONMENT: Lives with: lives with their spouse Lives in: House/apartment Stairs: a few steps to enter. Deck in back of home w/ HR Has following equipment at home: occasional use of cane  PLOF: Independent with basic ADLs  PATIENT GOALS: improve balance and stamina, get back to where I was a year ago  OBJECTIVE:    TODAY'S TREATMENT: 09/27/2023 Activity Comments  Sit to stand with PWR! Move standing -5 reps sit to stand then PWR! UP -5 reps sit to stand, then [employer] -5 reps sit to stand, then [employer]! Twist -5 reps sit to stand, then PWR! Step  Cues for activation of BUEs with rock, twist and step   Interval walk:  4:30 sec BIG walk, preferred walk, BIG and fast walk/preferred walk 1-2 min intervals  March with opposite arm lift, 20 ft x 4 reps Pt c/o lightheadedness  Vitals 104/78 sitting  Vitals 87/73, HR 85 bpm standing  Orthostatic hypotension measures: Supine after 5 minutes Standing after 1 min Standing after 3 min   105/66  HR 74 69/54      PATIENT EDUCATION: Education details: BP measures (?orthostatic hypotension), with dropping BP measures contributing to lightheadedness; discussed slowed transitions between supine>sit>stand, good hydration, gentle ankle/lower extremity exercises Person educated: Patient and Spouse Education method: Explanation Education comprehension: verbalized understanding    HOME EXERCISE PROGRAM: Provided handout for cycling HIIT to progress for Pedaling for PD  protocol  Access Code: [email or web address] URL: [email or web address] Date: 09/04/2023 Prepared by: Tawni Ferrier  Program Notes n/a  Exercises - Shoulder External Rotation and Scapular Retraction  - 1-2 x daily - 5 x weekly - 2 sets - 10 reps - Sit to Stand with Arm Swing  - 1-2 x daily - 5 x weekly - 2 sets - 10 reps - Active Straight Leg Raise with Quad Set  - 1-2 x daily - 5 x weekly - 2 sets - 10 reps - Sidelying Hip Abduction  - 1-2 x daily - 5 x weekly - 2 sets - 10 reps - Side Stepping with Resistance at Thighs and Counter Support  - 1 x daily - 7 x weekly - 1-2 sets - 2 min round hold  ________________________________________________________________________________________ Note: Objective measures were completed at Evaluation unless otherwise noted.  DIAGNOSTIC FINDINGS:   COGNITION: Overall cognitive status: Within functional limits for tasks assessed   SENSATION: Not tested  COORDINATION: WFL  POSTURE: forward head  LOWER EXTREMITY ROM:    WFL  LOWER EXTREMITY MMT:   Grossly 4/5 to seated resisted tests  BED MOBILITY:  indep  TRANSFERS: independent  CURB:  Findings: independent  STAIRS: Not tested GAIT: Findings: Distance walked:   and Comments: WFL, deficits during turning R vs L  FUNCTIONAL TESTS:  5 times sit to stand: 10 sec 10 meter walk test: 11.84 sec = 2.7 ft/sec  Mini-BESTest: 19/28 3 meter backwards walk test: 8.3 sec   ______________________________________________________________________________________  GOALS: Goals reviewed with patient? Yes  SHORT TERM GOALS: Target date: 09/17/2023   Patient will be independent in HEP to improve functional outcomes Baseline: Goal status: MET per reports  2.  Demo improved postural stability per mild-mod sway x 30 sec condition 4 M-CTSIB  to improve safety with ADL Baseline: mod-severe x 10 sec Goal status: MET-- Can hold x 30 seconds on 09/25/23  LONG TERM GOALS: Target date:  10/08/2023   Patient to ambulate at least 1500 ft during in order to improve functional endurance.  Baseline: 1037 ft 09/06/23 Goal status: IN PROGRESS 09/06/23  2.  Demo improved balance, motor control, and reduced risk for falls per score 24/28 Mini-BESTest Baseline: 19/28 Goal status: INITIAL  3.  Demo reduced risk for falls per time 4.8 sec 3 meter Backwards Walk Test Baseline: 8.3 sec Goal status: INITIAL  4.  Report improved activity tolerance as evidenced by ability to participate in community activities, e.g. shopping, exercise class, outings with friends, etc Baseline:  Goal status: INITIAL  5.  Pt to report improved thoracic pain as to not limiting standing ADL/housekeeping tasks to improve activity tolerance  Baseline: 6/10 thoracic pain with prolonged standing  Goal status: INITIAL ASSESSMENT:  CLINICAL IMPRESSION:  Pt presents today with no initial complaints.  After warm up of PWR! Moves and gait activities, pt reports lightheadedness with more challenging march/reciprocal arm swing activity.  Measured BP in sitting and standing, with both measures somewhat low; systolic BP does imne>79 points from sitting to standing, so performed orthostatic BP measures in supine>standing.  Pt's BP goes from 106/55 in supine to 69/57 in standing with definite reports of dizziness and lightheadedness.  Educated on drop in BP and how to optimize safety with this; pt/wife plan to reach out to ask MD about BP and symptoms. He will continue to benefit from skilled PT towards goals for improved functional mobility and decreased fall risk.   EVAL: Patient is a 68 y.o. male who was seen today for physical therapy evaluation and treatment for Parkinson's Disease.  Pt reports remote hx of surgery and experiencing functional decline since that time w/ decrease in strength, activity tolerance, balance, and gait deviations.  Demonstrates difficulty with postural control and balance as evidenced by score  19/28 Mini-BESTest indicating increased risk for falls as well as with .  Pt reports activity limitations not able to ambulate community-level distances due to fatigue and balance issues. Would benefit from PT services to address deficits and limitations to improve functional mobility and reduce risk for falls.    OBJECTIVE IMPAIRMENTS: decreased activity tolerance, decreased balance, decreased endurance, decreased knowledge of use of DME, difficulty walking, decreased strength, and pain.   PLAN:  PT FREQUENCY: 1-2x/week  PT DURATION: 6 weeks  PLANNED INTERVENTIONS: 97750- Physical Performance Testing, 97110-Therapeutic exercises, 97530- Therapeutic activity, [id number]- Neuromuscular re-education, 97535- Self Care, [id number]- Manual therapy, [id number]- Gait training, [phone]- Orthotic Initial, [phone]- Orthotic/Prosthetic subsequent, [phone]- Canalith repositioning, and [phone]- Aquatic Therapy  PLAN FOR NEXT SESSION:Ask about BP measures/follow up with MD; Continue BLE strengthening, HEP review-- plan to shift back towards past HEP (from prior episode), treadmill training, bike HIIT for home performance, LE strengthening, postural training, PWR! Exercises.  Light cuff weight on LUE for arm swing  Erienne Spelman W., PT 09/28/23 8:11 AM [facility] [address], [address] [place], [place] [address] Phone # [phone] Fax # [phone]

## 2023-10-02 ENCOUNTER — Ambulatory Visit: Admitting: Physical Therapy

## 2023-10-02 ENCOUNTER — Encounter: Payer: Self-pay | Admitting: Physical Therapy

## 2023-10-02 DIAGNOSIS — R2681 Unsteadiness on feet: Secondary | ICD-10-CM | POA: Diagnosis not present

## 2023-10-02 DIAGNOSIS — R29818 Other symptoms and signs involving the nervous system: Secondary | ICD-10-CM

## 2023-10-02 DIAGNOSIS — M6281 Muscle weakness (generalized): Secondary | ICD-10-CM

## 2023-10-02 DIAGNOSIS — G20A1 Parkinson's disease without dyskinesia, without mention of fluctuations: Secondary | ICD-10-CM

## 2023-10-02 DIAGNOSIS — R262 Difficulty in walking, not elsewhere classified: Secondary | ICD-10-CM | POA: Diagnosis not present

## 2023-10-02 DIAGNOSIS — R278 Other lack of coordination: Secondary | ICD-10-CM

## 2023-10-02 NOTE — Therapy (Signed)
 OUTPATIENT PHYSICAL THERAPY NEURO TREATMENT AND PROGRESS NOTE  Progress Note Reporting Period 08/27/2023 to 10/02/2023  See note below for Objective Data and Assessment of Progress/Goals.      Patient Name: Patrick Gray MRN: [id number] DOB:1955-10-29, 68 y.o., male Today's Date: 10/02/2023  PCP: Seabron Lenis, MD REFERRING PROVIDER: Evonnie Asberry RAMAN, DO  END OF SESSION:  PT End of Session - 10/02/23 1307     Visit Number 10    Number of Visits 12    Date for PT Re-Evaluation 10/08/23    Authorization Type [employer]    Authorization Time Period approved 12 PT visits from 08/27/2023-10/08/2023    Authorization - Visit Number 10    Authorization - Number of Visits 12    Progress Note Due on Visit 10    PT Start Time 1310    PT Stop Time 1350    PT Time Calculation (min) 40 min    Activity Tolerance Patient tolerated treatment well    Behavior During Therapy WFL for tasks assessed/performed            Past Medical History:  Diagnosis Date   Acoustic neuroma (HCC)    left   Anxiety    Arthritis    Complication of anesthesia    Delusion, violent, hallucination, hospitalized 8 days, urine retention   Coronary artery disease    Deafness in left ear    Delusion (HCC)    after fall on pain meds and prednisone   Dysrhythmia    PAF   Foley catheter in place    H/O cardiac radiofrequency ablation    HTN (hypertension)    Hypercholesterolemia    Hypopotassemia    Insomnia    Parkinson's disease (HCC)    Paroxysmal atrial fibrillation (HCC)    Radiculopathy    after fall   Tremor    left hand   Vitamin B 12 deficiency    Past Surgical History:  Procedure Laterality Date   ACOUSTIC NEUROMA RESECTION Left 19990   ANTERIOR CERVICAL DECOMP/DISCECTOMY FUSION N/A 04/26/2020   Procedure: Anterior Cervical Decompression Fusion - Cervical five-Cervical six - Cervical six-Cervical seven;  Surgeon: Onetha Kuba, MD;  Location: [facility] OR;  Service: Neurosurgery;  Laterality:  N/A;   APPENDECTOMY  1976   ATRIAL FIBRILLATION ABLATION N/A 12/19/2016   Procedure: ATRIAL FIBRILLATION ABLATION;  Surgeon: Kelsie Agent, MD;  Location: [facility] INVASIVE CV LAB;  Service: Cardiovascular;  Laterality: N/A;   CARDIAC CATHETERIZATION N/A 10/07/2015   Procedure: Left Heart Cath and Coronary Angiography;  Surgeon: Maude JAYSON Emmer, MD;  Location: [facility] INVASIVE CV LAB;  Service: Cardiovascular;  Laterality: N/A;   CARDIOVERSION N/A 06/17/2013   Procedure: CARDIOVERSION;  Surgeon: Maude JAYSON Emmer, MD;  Location: [facility] ENDOSCOPY;  Service: Cardiovascular;  Laterality: N/A;   CARDIOVERSION N/A 05/11/2016   Procedure: CARDIOVERSION;  Surgeon: Jerel Balding, MD;  Location: [facility] ENDOSCOPY;  Service: Cardiovascular;  Laterality: N/A;   CARDIOVERSION N/A 05/24/2017   Procedure: CARDIOVERSION;  Surgeon: Shlomo Wilbert SAUNDERS, MD;  Location: [facility] ENDOSCOPY;  Service: Cardiovascular;  Laterality: N/A;   CARDIOVERSION N/A 11/19/2017   Procedure: CARDIOVERSION;  Surgeon: Mona Vinie JAYSON, MD;  Location: [facility] ENDOSCOPY;  Service: Cardiovascular;  Laterality: N/A;   CHOLECYSTECTOMY  1976   CLUB FOOT RELEASE  1968   ELECTROPHYSIOLOGIC STUDY N/A 11/30/2015   Procedure: Atrial Fibrillation Ablation;  Surgeon: Agent Kelsie, MD;  Location: [facility] INVASIVE CV LAB;  Service: Cardiovascular;  Laterality: N/A;   ROBOT ASSISTED LAPAROSCOPIC NEPHRECTOMY Right 04/27/2023  Procedure: XI RIGHT RETROPERITONEAL ROBOTIC ASSISTED LAPAROSCOPIC RADICAL NEPHRECTOMY;  Surgeon: Alvaro Ricardo KATHEE Mickey., MD;  Location: [facility] ORS;  Service: Urology;  Laterality: Right;  180 MINUTES NEEDED FOR CASE   TEE WITHOUT CARDIOVERSION N/A 05/11/2016   Procedure: TRANSESOPHAGEAL ECHOCARDIOGRAM (TEE);  Surgeon: Jerel Balding, MD;  Location: [facility] ENDOSCOPY;  Service: Cardiovascular;  Laterality: N/A;   TONSILLECTOMY  1967   TOTAL SHOULDER ARTHROPLASTY Left 06/15/2016   Procedure: TOTAL SHOULDER ARTHROPLASTY;  Surgeon: Eva Herring, MD;  Location: [facility] OR;  Service:  Orthopedics;  Laterality: Left;  Left total shoulder arthroplasty   TRANSURETHRAL RESECTION OF PROSTATE N/A 07/02/2020   Procedure: TRANSURETHRAL RESECTION OF THE PROSTATE (TURP);  Surgeon: Carolee Sherwood JONETTA DOUGLAS, MD;  Location: [facility] ORS;  Service: Urology;  Laterality: N/A;   Patient Active Problem List   Diagnosis Date Noted   Renal mass, right 04/27/2023   Hypercoagulable state due to persistent atrial fibrillation (HCC) 08/02/2022   Parkinson's disease without dyskinesia or fluctuating manifestations (HCC) 12/21/2021   BPH (benign prostatic hyperplasia) 07/02/2020   Myelopathy (HCC) 04/26/2020   Atrial flutter (HCC) 12/19/2016   S/P shoulder replacement, left 06/15/2016   A-fib (HCC) 11/30/2015   HTN (hypertension)    Hypercholesterolemia    Deafness in left ear    Atrial fibrillation (HCC)    Hypopotassemia     ONSET DATE: March 2025 REFERRING DIAG:  G20.A1 (ICD-10-CM) - Parkinson's disease without dyskinesia or fluctuating manifestations (HCC)   THERAPY DIAG:  Unsteadiness on feet  Muscle weakness (generalized)  Other symptoms and signs involving the nervous system  Parkinson's disease without dyskinesia or fluctuating manifestations (HCC)  Difficulty in walking, not elsewhere classified  Other lack of coordination  Rationale for Evaluation and Treatment: Rehabilitation  SUBJECTIVE:                                                                                                                                                                                       SUBJECTIVE STATEMENT: Pt does feel he has been making slow progress. Pt states he followed up with his doctor on his BP and they decreased his BP medication dosage.  EVAL: Was found to have an issue with this kidney and had surgery in March 2025 and has had decline since recovering from that procedure and has had increasing weakness, balance problems, and reduced endurance since that time. Spouse notes decreased speed  in mobility, increased tripping, and freezing of gait.  Reports using stationary bike at home most days and balance exercises at home. Not able to walk outdoors as frequently due to weather and busy traffic around home.  Pt accompanied by: self, wife  PERTINENT HISTORY: Parkinsons Disease, diagnosed November, 2023. History of cervical myelopathy from severe cervical stenosis at C5-C6             -Status post surgical intervention by Dr. Onetha in March, 2022. History of vestibular schwannoma             -Status post surgical intervention/removal many years ago (30+), but MRI in March, 2022 demonstrated 23 x 16 x 14 mm mass, consistent with vestibular schwannoma - left total shoulder replacement PAIN:  Are you having pain? Pt reports a lot of chronic pain in LB, R foot, R knee   PRECAUTIONS: Fall  WEIGHT BEARING RESTRICTIONS: No  FALLS: Has patient fallen in last 6 months? No  LIVING ENVIRONMENT: Lives with: lives with their spouse Lives in: House/apartment Stairs: a few steps to enter. Deck in back of home w/ HR Has following equipment at home: occasional use of cane  PLOF: Independent with basic ADLs  PATIENT GOALS: improve balance and stamina, get back to where I was a year ago  OBJECTIVE:  TODAY'S TREATMENT: 10/02/2023 Activity Comments  Vitals Sitting: 126/92; 78 BPM Standing: 108/87; 72 BPM   Interval walk BIG walk, marching with reciprocal arms, side stepping with arms up/down, backwards walking, [place] by counter  Standing in middle with multi colored dots around patient PT cueing different legs to touch different colors    Sit to stand with PWR! Move standing -5 reps sit to stand then PWR! UP -5 reps sit to stand, then [employer] -5 reps sit to stand, then [employer]! Twist -5 reps sit to stand, then PWR! Step  Cues for activation of BUEs with rock, twist and step Visual targets with colored dots and poles for pt to step and reach towards                       TODAY'S TREATMENT: 09/27/2023 Activity Comments  Sit to stand with PWR! Move standing -5 reps sit to stand then PWR! UP -5 reps sit to stand, then [employer] -5 reps sit to stand, then [employer]! Twist -5 reps sit to stand, then PWR! Step  Cues for activation of BUEs with rock, twist and step   Interval walk:  4:30 sec BIG walk, preferred walk, BIG and fast walk/preferred walk 1-2 min intervals  March with opposite arm lift, 20 ft x 4 reps Pt c/o lightheadedness  Vitals 104/78 sitting  Vitals 87/73, HR 85 bpm standing  Orthostatic hypotension measures: Supine after 5 minutes Standing after 1 min Standing after 3 min   105/66  HR 74 69/54      PATIENT EDUCATION: Education details: BP measures (?orthostatic hypotension), with dropping BP measures contributing to lightheadedness; discussed slowed transitions between supine>sit>stand, good hydration, gentle ankle/lower extremity exercises Person educated: Patient and Spouse Education method: Explanation Education comprehension: verbalized understanding    HOME EXERCISE PROGRAM: Provided handout for cycling HIIT to progress for Pedaling for PD protocol  Access Code: [email or web address] URL: [email or web address] Date: 09/04/2023 Prepared by: Tawni Ferrier  Program Notes n/a  Exercises - Shoulder External Rotation and Scapular Retraction  - 1-2 x daily - 5 x weekly - 2 sets - 10 reps - Sit to Stand with Arm Swing  - 1-2 x daily - 5 x weekly - 2 sets - 10 reps - Active Straight Leg Raise with Quad Set  - 1-2 x daily - 5 x weekly - 2 sets - 10 reps - Sidelying Hip Abduction  - 1-2  x daily - 5 x weekly - 2 sets - 10 reps - Side Stepping with Resistance at Thighs and Counter Support  - 1 x daily - 7 x weekly - 1-2 sets - 2 min round hold  ________________________________________________________________________________________ Note: Objective measures were completed at Evaluation unless otherwise noted.  DIAGNOSTIC  FINDINGS:   COGNITION: Overall cognitive status: Within functional limits for tasks assessed   SENSATION: Not tested  COORDINATION: WFL  POSTURE: forward head  LOWER EXTREMITY ROM:    WFL  LOWER EXTREMITY MMT:   Grossly 4/5 to seated resisted tests  BED MOBILITY:  indep  TRANSFERS: independent  CURB:  Findings: independent  STAIRS: Not tested GAIT: Findings: Distance walked:   and Comments: WFL, deficits during turning R vs L  FUNCTIONAL TESTS:  5 times sit to stand: 10 sec 10 meter walk test: 11.84 sec = 2.7 ft/sec  Mini-BESTest: 19/28 3 meter backwards walk test: 8.3 sec    ______________________________________________________________________________________  GOALS: Goals reviewed with patient? Yes  SHORT TERM GOALS: Target date: 09/17/2023   Patient will be independent in HEP to improve functional outcomes Baseline: Goal status: MET per reports  2.  Demo improved postural stability per mild-mod sway x 30 sec condition 4 M-CTSIB to improve safety with ADL Baseline: mod-severe x 10 sec Goal status: MET-- Can hold x 30 seconds on 09/25/23  LONG TERM GOALS: Target date: 10/08/2023   Patient to ambulate at least 1500 ft during in order to improve functional endurance.  Baseline: 1037 ft 09/06/23 Goal status: IN PROGRESS 09/06/23  2.  Demo improved balance, motor control, and reduced risk for falls per score 24/28 Mini-BESTest Baseline: 19/28 Goal status: INITIAL  3.  Demo reduced risk for falls per time 4.8 sec 3 meter Backwards Walk Test Baseline: 8.3 sec Goal status: INITIAL  4.  Report improved activity tolerance as evidenced by ability to participate in community activities, e.g. shopping, exercise class, outings with friends, etc Baseline:  10/02/23: Pt reports he's been able to do a lot more -- able to walk around [employer] Goal status: INITIAL  5.  Pt to report improved thoracic pain as to not limiting standing ADL/housekeeping tasks to  improve activity tolerance  Baseline: 6/10 thoracic pain with prolonged standing  10/02/23: Still 6/10  Goal status: INITIAL   ASSESSMENT:  CLINICAL IMPRESSION: BP remained well controlled today with pt reporting no issues with dizziness. Worked on coordination, balance and gait this session. Pt requires visual and verbal cues for improved step length and reach. Pt is slowly making progress towards his LTGs and will benefit from continued work with PT.   EVAL: Patient is a 68 y.o. male who was seen today for physical therapy evaluation and treatment for Parkinson's Disease.  Pt reports remote hx of surgery and experiencing functional decline since that time w/ decrease in strength, activity tolerance, balance, and gait deviations.  Demonstrates difficulty with postural control and balance as evidenced by score 19/28 Mini-BESTest indicating increased risk for falls as well as with .  Pt reports activity limitations not able to ambulate community-level distances due to fatigue and balance issues. Would benefit from PT services to address deficits and limitations to improve functional mobility and reduce risk for falls.    OBJECTIVE IMPAIRMENTS: decreased activity tolerance, decreased balance, decreased endurance, decreased knowledge of use of DME, difficulty walking, decreased strength, and pain.   PLAN:  PT FREQUENCY: 1-2x/week  PT DURATION: 6 weeks  PLANNED INTERVENTIONS: 97750- Physical Performance Testing, 97110-Therapeutic exercises,  02469- Therapeutic activity, [id number]- Neuromuscular re-education, [id number]- Self Care, [id number]- Manual therapy, [id number]- Gait training, [id number]- Orthotic Initial, [id number]- Orthotic/Prosthetic subsequent, [phone]- Canalith repositioning, and [id number]- Aquatic Therapy  PLAN FOR NEXT SESSION:Continue BLE strengthening, HEP review-- plan to shift back towards past HEP (from prior episode), treadmill training, bike HIIT for home performance, LE strengthening, postural training,  PWR! Exercises.  Light cuff weight on LUE for arm swing  Antonino Nienhuis April Ma L Ilyana Manuele, PT 10/02/23 1:08 PM [facility] [address] [place], [address] [place], [place] [address] Phone # [phone] Fax # [phone]

## 2023-10-04 ENCOUNTER — Encounter: Payer: Self-pay | Admitting: Physical Therapy

## 2023-10-04 ENCOUNTER — Ambulatory Visit: Admitting: Physical Therapy

## 2023-10-04 DIAGNOSIS — R262 Difficulty in walking, not elsewhere classified: Secondary | ICD-10-CM

## 2023-10-04 DIAGNOSIS — R2681 Unsteadiness on feet: Secondary | ICD-10-CM | POA: Diagnosis not present

## 2023-10-04 DIAGNOSIS — M6281 Muscle weakness (generalized): Secondary | ICD-10-CM | POA: Diagnosis not present

## 2023-10-04 DIAGNOSIS — G20A1 Parkinson's disease without dyskinesia, without mention of fluctuations: Secondary | ICD-10-CM | POA: Diagnosis not present

## 2023-10-04 DIAGNOSIS — R278 Other lack of coordination: Secondary | ICD-10-CM | POA: Diagnosis not present

## 2023-10-04 DIAGNOSIS — R29818 Other symptoms and signs involving the nervous system: Secondary | ICD-10-CM | POA: Diagnosis not present

## 2023-10-04 NOTE — Therapy (Signed)
 OUTPATIENT PHYSICAL THERAPY NEURO TREATMENT NOTE/RECERT      Patient Name: Patrick Gray MRN: [id number] DOB:08/16/1955, 68 y.o., male Today's Date: 10/04/2023  PCP: Seabron Lenis, MD REFERRING PROVIDER: Evonnie Asberry RAMAN, DO  END OF SESSION:  PT End of Session - 10/04/23 1102     Visit Number 11    Number of Visits 19    Date for PT Re-Evaluation 11/02/23    Authorization Type [employer]-reauth requested    Authorization Time Period approved 12 PT visits from 08/27/2023-10/08/2023    Authorization - Visit Number 11    Authorization - Number of Visits 12    PT Start Time 1103    PT Stop Time 1146    PT Time Calculation (min) 43 min    Activity Tolerance Patient tolerated treatment well    Behavior During Therapy WFL for tasks assessed/performed             Past Medical History:  Diagnosis Date   Acoustic neuroma (HCC)    left   Anxiety    Arthritis    Complication of anesthesia    Delusion, violent, hallucination, hospitalized 8 days, urine retention   Coronary artery disease    Deafness in left ear    Delusion (HCC)    after fall on pain meds and prednisone   Dysrhythmia    PAF   Foley catheter in place    H/O cardiac radiofrequency ablation    HTN (hypertension)    Hypercholesterolemia    Hypopotassemia    Insomnia    Parkinson's disease (HCC)    Paroxysmal atrial fibrillation (HCC)    Radiculopathy    after fall   Tremor    left hand   Vitamin B 12 deficiency    Past Surgical History:  Procedure Laterality Date   ACOUSTIC NEUROMA RESECTION Left 19990   ANTERIOR CERVICAL DECOMP/DISCECTOMY FUSION N/A 04/26/2020   Procedure: Anterior Cervical Decompression Fusion - Cervical five-Cervical six - Cervical six-Cervical seven;  Surgeon: Onetha Kuba, MD;  Location: [facility] OR;  Service: Neurosurgery;  Laterality: N/A;   APPENDECTOMY  1976   ATRIAL FIBRILLATION ABLATION N/A 12/19/2016   Procedure: ATRIAL FIBRILLATION ABLATION;  Surgeon: Kelsie Agent, MD;   Location: [facility] INVASIVE CV LAB;  Service: Cardiovascular;  Laterality: N/A;   CARDIAC CATHETERIZATION N/A 10/07/2015   Procedure: Left Heart Cath and Coronary Angiography;  Surgeon: Maude JAYSON Emmer, MD;  Location: [facility] INVASIVE CV LAB;  Service: Cardiovascular;  Laterality: N/A;   CARDIOVERSION N/A 06/17/2013   Procedure: CARDIOVERSION;  Surgeon: Maude JAYSON Emmer, MD;  Location: [facility] ENDOSCOPY;  Service: Cardiovascular;  Laterality: N/A;   CARDIOVERSION N/A 05/11/2016   Procedure: CARDIOVERSION;  Surgeon: Jerel Balding, MD;  Location: [facility] ENDOSCOPY;  Service: Cardiovascular;  Laterality: N/A;   CARDIOVERSION N/A 05/24/2017   Procedure: CARDIOVERSION;  Surgeon: Shlomo Wilbert SAUNDERS, MD;  Location: [facility] ENDOSCOPY;  Service: Cardiovascular;  Laterality: N/A;   CARDIOVERSION N/A 11/19/2017   Procedure: CARDIOVERSION;  Surgeon: Mona Vinie JAYSON, MD;  Location: [facility] ENDOSCOPY;  Service: Cardiovascular;  Laterality: N/A;   CHOLECYSTECTOMY  1976   CLUB FOOT RELEASE  1968   ELECTROPHYSIOLOGIC STUDY N/A 11/30/2015   Procedure: Atrial Fibrillation Ablation;  Surgeon: Agent Kelsie, MD;  Location: [facility] INVASIVE CV LAB;  Service: Cardiovascular;  Laterality: N/A;   ROBOT ASSISTED LAPAROSCOPIC NEPHRECTOMY Right 04/27/2023   Procedure: XI RIGHT RETROPERITONEAL ROBOTIC ASSISTED LAPAROSCOPIC RADICAL NEPHRECTOMY;  Surgeon: Alvaro Ricardo KATHEE Mickey., MD;  Location: [facility] ORS;  Service: Urology;  Laterality: Right;  180 MINUTES NEEDED FOR CASE   TEE WITHOUT CARDIOVERSION N/A 05/11/2016   Procedure: TRANSESOPHAGEAL ECHOCARDIOGRAM (TEE);  Surgeon: Jerel Balding, MD;  Location: [facility] ENDOSCOPY;  Service: Cardiovascular;  Laterality: N/A;   TONSILLECTOMY  1967   TOTAL SHOULDER ARTHROPLASTY Left 06/15/2016   Procedure: TOTAL SHOULDER ARTHROPLASTY;  Surgeon: Eva Herring, MD;  Location: [facility] OR;  Service: Orthopedics;  Laterality: Left;  Left total shoulder arthroplasty   TRANSURETHRAL RESECTION OF PROSTATE N/A 07/02/2020   Procedure: TRANSURETHRAL RESECTION  OF THE PROSTATE (TURP);  Surgeon: Carolee Sherwood JONETTA DOUGLAS, MD;  Location: [facility] ORS;  Service: Urology;  Laterality: N/A;   Patient Active Problem List   Diagnosis Date Noted   Renal mass, right 04/27/2023   Hypercoagulable state due to persistent atrial fibrillation (HCC) 08/02/2022   Parkinson's disease without dyskinesia or fluctuating manifestations (HCC) 12/21/2021   BPH (benign prostatic hyperplasia) 07/02/2020   Myelopathy (HCC) 04/26/2020   Atrial flutter (HCC) 12/19/2016   S/P shoulder replacement, left 06/15/2016   A-fib (HCC) 11/30/2015   HTN (hypertension)    Hypercholesterolemia    Deafness in left ear    Atrial fibrillation (HCC)    Hypopotassemia     ONSET DATE: March 2025 REFERRING DIAG:  G20.A1 (ICD-10-CM) - Parkinson's disease without dyskinesia or fluctuating manifestations (HCC)   THERAPY DIAG:  Unsteadiness on feet  Muscle weakness (generalized)  Other symptoms and signs involving the nervous system  Difficulty in walking, not elsewhere classified  Rationale for Evaluation and Treatment: Rehabilitation  SUBJECTIVE:                                                                                                                                                                                       SUBJECTIVE STATEMENT: BP seems better.  Wife reports pt is having more energy.  Don't feel like I've made as steady progress as last bout.  Not yet to my baseline from where I was before the kidney surgery.  Feel like I'm stumbling more-moving more slowly. EVAL: Was found to have an issue with this kidney and had surgery in March 2025 and has had decline since recovering from that procedure and has had increasing weakness, balance problems, and reduced endurance since that time. Spouse notes decreased speed in mobility, increased tripping, and freezing of gait.  Reports using stationary bike at home most days and balance exercises at home. Not able to walk outdoors as  frequently due to weather and busy traffic around home.  Pt accompanied by: self, wife   PERTINENT HISTORY: Parkinsons Disease, diagnosed November, 2023. History of cervical myelopathy from severe cervical stenosis at C5-C6             -  Status post surgical intervention by Dr. Onetha in March, 2022. History of vestibular schwannoma             -Status post surgical intervention/removal many years ago (30+), but MRI in March, 2022 demonstrated 23 x 16 x 14 mm mass, consistent with vestibular schwannoma - left total shoulder replacement PAIN:  Are you having pain? Yes: NPRS scale: 6/10 Pain location: thoracic spine Pain description: sore Aggravating factors: standing prolonged on hard surfaces Relieving factors: rest   PRECAUTIONS: Fall  WEIGHT BEARING RESTRICTIONS: No  FALLS: Has patient fallen in last 6 months? No  LIVING ENVIRONMENT: Lives with: lives with their spouse Lives in: House/apartment Stairs: a few steps to enter. Deck in back of home w/ HR Has following equipment at home: occasional use of cane  PLOF: Independent with basic ADLs  PATIENT GOALS: improve balance and stamina, get back to where I was a year ago  OBJECTIVE:    TODAY'S TREATMENT: 10/04/2023 Activity Comments  MiniBEST:  22/28 Improved from 19/28  18M walk backwards:  9.69 sec   TUG 11.22 sec TUG cognitive 19.56 sec   6 MWT 1110 ft Improved from 1037 ft          [facility] PT Assessment - 10/04/23 1120       Mini-BESTest   Sit To Stand Normal: Comes to stand without use of hands and stabilizes independently.    Rise to Toes Normal: Stable for 3 s with maximum height.    Stand on one leg (left) Moderate: < 20 s    Stand on one leg (right) Moderate: < 20 s    Stand on one leg - lowest score 1    Compensatory Stepping Correction - Forward Normal: Recovers independently with a single, large step (second realignement is allowed).    Compensatory Stepping Correction - Backward Normal: Recovers  independently with a single, large step    Compensatory Stepping Correction - Left Lateral Moderate: Several steps to recover equilibrium    Compensatory Stepping Correction - Right Lateral Normal: Recovers independently with 1 step (crossover or lateral OK)    Stepping Corredtion Lateral - lowest score 1    Stance - Feet together, eyes open, firm surface  Normal: 30s    Stance - Feet together, eyes closed, foam surface  Moderate: < 30s    Incline - Eyes Closed Normal: Stands independently 30s and aligns with gravity    Change in Gait Speed Normal: Significantly changes walkling speed without imbalance    Walk with head turns - Horizontal Moderate: performs head turns with reduction in gait speed.    Walk with pivot turns Moderate:Turns with feet close SLOW (>4 steps) with good balance.    Step over obstacles Normal: Able to step over box with minimal change of gait speed and with good balance.    Timed UP & GO with Dual Task Moderate: Dual Task affects either counting OR walking (>10%) when compared to the TUG without Dual Task.    Mini-BEST total score 22            PATIENT EDUCATION: Education details: Progress towards goals, POC (recert completed today).  Requested pt bring in his old HEP (to compare to current HEP) to make sure he has appropriate exercises and to avoid overlap with HEP Person educated: Patient and Spouse Education method: Explanation Education comprehension: verbalized understanding    HOME EXERCISE PROGRAM: Provided handout for cycling HIIT to progress for Pedaling for PD protocol  Access Code: [email or web address]  URL: [email or web address] Date: 09/04/2023 Prepared by: Tawni Ferrier  Program Notes n/a  Exercises - Shoulder External Rotation and Scapular Retraction  - 1-2 x daily - 5 x weekly - 2 sets - 10 reps - Sit to Stand with Arm Swing  - 1-2 x daily - 5 x weekly - 2 sets - 10 reps - Active Straight Leg Raise with Quad Set  - 1-2 x daily - 5  x weekly - 2 sets - 10 reps - Sidelying Hip Abduction  - 1-2 x daily - 5 x weekly - 2 sets - 10 reps - Side Stepping with Resistance at Thighs and Counter Support  - 1 x daily - 7 x weekly - 1-2 sets - 2 min round hold  ________________________________________________________________________________________ Note: Objective measures were completed at Evaluation unless otherwise noted.  DIAGNOSTIC FINDINGS:   COGNITION: Overall cognitive status: Within functional limits for tasks assessed   SENSATION: Not tested  COORDINATION: WFL  POSTURE: forward head  LOWER EXTREMITY ROM:    WFL  LOWER EXTREMITY MMT:   Grossly 4/5 to seated resisted tests  BED MOBILITY:  indep  TRANSFERS: independent  CURB:  Findings: independent  STAIRS: Not tested GAIT: Findings: Distance walked:   and Comments: WFL, deficits during turning R vs L  FUNCTIONAL TESTS:  5 times sit to stand: 10 sec 10 meter walk test: 11.84 sec = 2.7 ft/sec  Mini-BESTest: 19/28 3 meter backwards walk test: 8.3 sec    ______________________________________________________________________________________  GOALS: Goals reviewed with patient? Yes  SHORT TERM GOALS: Target date: 09/17/2023   Patient will be independent in HEP to improve functional outcomes Baseline: Goal status: MET per reports  2.  Demo improved postural stability per mild-mod sway x 30 sec condition 4 M-CTSIB to improve safety with ADL Baseline: mod-severe x 10 sec Goal status: MET-- Can hold x 30 seconds on 09/25/23  LONG TERM GOALS: Target date: 10/08/2023>11/02/23   Patient to ambulate at least 1500 ft during in order to improve functional endurance.  Baseline: 1037 ft 09/06/23> 1110 ft 10/04/2023 Goal status: IN PROGRESS 10/04/2023  2.  Demo improved balance, motor control, and reduced risk for falls per score 24/28 Mini-BESTest Baseline: 19/28>22/28 10/04/2023 Goal status: IN PROGRESS 10/04/2023  3.  Demo reduced risk for falls per  time 4.8 sec 3 meter Backwards Walk Test Baseline: 8.3 sec>9.69 sec 10/04/2023 Goal status: IN PROGRESS 10/04/2023  4.  Report improved activity tolerance as evidenced by ability to participate in community activities, e.g. shopping, exercise class, outings with friends, etc Baseline:  10/02/23: Pt reports he's been able to do a lot more -- able to walk around [employer] Goal status: IN PROGRESS  5.  Pt to report improved thoracic pain as to not limiting standing ADL/housekeeping tasks to improve activity tolerance  Baseline: 6/10 thoracic pain with prolonged standing  10/02/23: Still 6/10  Goal status: IN PROGRESS   ASSESSMENT:  CLINICAL IMPRESSION: Pt presents today and assessed LTGs to complete recert.  He reports some improvement, but not as much or as fast as he would like.  Of note, he has decreased some of his BP medications and reports that BP measures are better and that he seems to have more energy. Objective measures for 6 MWT 1110 ft, MiniBESTest score 22/28 demonstrate improvement, just not to goal level.  TUG/TUG cognitive scores >10% difference.  He is continuing to have some thoracic pain, as well as R knee pain. He is progressing towards goals and remains at fall risk.  He would conitnue to benefit from skilled PT towards goals for improved functional mobility and decreased fall risk.   EVAL: Patient is a 68 y.o. male who was seen today for physical therapy evaluation and treatment for Parkinson's Disease.  Pt reports remote hx of surgery and experiencing functional decline since that time w/ decrease in strength, activity tolerance, balance, and gait deviations.  Demonstrates difficulty with postural control and balance as evidenced by score 19/28 Mini-BESTest indicating increased risk for falls as well as with .  Pt reports activity limitations not able to ambulate community-level distances due to fatigue and balance issues. Would benefit from PT services to address deficits and  limitations to improve functional mobility and reduce risk for falls.    OBJECTIVE IMPAIRMENTS: decreased activity tolerance, decreased balance, decreased endurance, decreased knowledge of use of DME, difficulty walking, decreased strength, and pain.   PLAN:  PT FREQUENCY: 1-2x/week  PT DURATION: 4 weeks, per recert 10/04/2023  PLANNED INTERVENTIONS: 97750- Physical Performance Testing, 97110-Therapeutic exercises, 97530- Therapeutic activity, 97112- Neuromuscular re-education, 97535- Self Care, 02859- Manual therapy, [id number]- Gait training, [id number]- Orthotic Initial, [id number]- Orthotic/Prosthetic subsequent, [phone]- Canalith repositioning, and [phone]- Aquatic Therapy  PLAN FOR NEXT SESSION:  Continue BLE strengthening, HEP review-- treadmill training, bike HIIT for home performance, BLE strengthening, postural training, PWR! Exercises (check to see which ones pt has at [facility] [place] [facility] PWR! Or supine PWR! Moves be appropriate?)* Pt to bring in HEP from previous bouts of therapy to checkDEWAINE STARLET GREIG LELON., PT 10/04/23 4:39 PM [facility] at [facility] [address] [place], [address] [place], [place] [address] Phone # [phone] Fax # [phone]  Referring diagnosis? G20.A1 (ICD-10-CM) - Parkinson's disease without dyskinesia or fluctuating manifestations (HCC) Treatment diagnosis? (if different than referring diagnosis) R26.81, M62.81, R29.818, R26.2 What was this (referring dx) caused by? []  Surgery []  Fall [x]  Ongoing issue []  Arthritis []  Other: ____________  Laterality: []  Rt [x]  Lt []  Both  Check all possible CPT codes:  *CHOOSE 10 OR LESS*    See Planned Interventions listed in the Plan section of the Evaluation.

## 2023-10-09 ENCOUNTER — Ambulatory Visit

## 2023-10-09 DIAGNOSIS — R2681 Unsteadiness on feet: Secondary | ICD-10-CM

## 2023-10-09 DIAGNOSIS — R262 Difficulty in walking, not elsewhere classified: Secondary | ICD-10-CM

## 2023-10-09 DIAGNOSIS — R29818 Other symptoms and signs involving the nervous system: Secondary | ICD-10-CM | POA: Diagnosis not present

## 2023-10-09 DIAGNOSIS — R278 Other lack of coordination: Secondary | ICD-10-CM | POA: Diagnosis not present

## 2023-10-09 DIAGNOSIS — M6281 Muscle weakness (generalized): Secondary | ICD-10-CM

## 2023-10-09 DIAGNOSIS — G20A1 Parkinson's disease without dyskinesia, without mention of fluctuations: Secondary | ICD-10-CM

## 2023-10-09 NOTE — Therapy (Signed)
 OUTPATIENT PHYSICAL THERAPY NEURO TREATMENT NOTE      Patient Name: Patrick Gray MRN: [id number] DOB:February 19, 1956, 68 y.o., male Today's Date: 10/09/2023  PCP: Seabron Lenis, MD REFERRING PROVIDER: Evonnie Asberry RAMAN, DO  END OF SESSION:  PT End of Session - 10/09/23 1100     Visit Number 12    Number of Visits 19    Date for PT Re-Evaluation 11/02/23    Authorization Type [employer]-reauth requested    Authorization Time Period total of 20 visits approved ffom 08/27/23 to 11/02/23    Authorization - Visit Number 12    Authorization - Number of Visits 20    Progress Note Due on Visit 20    PT Start Time 1100    PT Stop Time 1145    PT Time Calculation (min) 45 min    Activity Tolerance Patient tolerated treatment well    Behavior During Therapy WFL for tasks assessed/performed             Past Medical History:  Diagnosis Date   Acoustic neuroma (HCC)    left   Anxiety    Arthritis    Complication of anesthesia    Delusion, violent, hallucination, hospitalized 8 days, urine retention   Coronary artery disease    Deafness in left ear    Delusion (HCC)    after fall on pain meds and prednisone   Dysrhythmia    PAF   Foley catheter in place    H/O cardiac radiofrequency ablation    HTN (hypertension)    Hypercholesterolemia    Hypopotassemia    Insomnia    Parkinson's disease (HCC)    Paroxysmal atrial fibrillation (HCC)    Radiculopathy    after fall   Tremor    left hand   Vitamin B 12 deficiency    Past Surgical History:  Procedure Laterality Date   ACOUSTIC NEUROMA RESECTION Left 19990   ANTERIOR CERVICAL DECOMP/DISCECTOMY FUSION N/A 04/26/2020   Procedure: Anterior Cervical Decompression Fusion - Cervical five-Cervical six - Cervical six-Cervical seven;  Surgeon: Onetha Kuba, MD;  Location: [facility] OR;  Service: Neurosurgery;  Laterality: N/A;   APPENDECTOMY  1976   ATRIAL FIBRILLATION ABLATION N/A 12/19/2016   Procedure: ATRIAL FIBRILLATION ABLATION;   Surgeon: Kelsie Agent, MD;  Location: [facility] INVASIVE CV LAB;  Service: Cardiovascular;  Laterality: N/A;   CARDIAC CATHETERIZATION N/A 10/07/2015   Procedure: Left Heart Cath and Coronary Angiography;  Surgeon: Maude JAYSON Emmer, MD;  Location: [facility] INVASIVE CV LAB;  Service: Cardiovascular;  Laterality: N/A;   CARDIOVERSION N/A 06/17/2013   Procedure: CARDIOVERSION;  Surgeon: Maude JAYSON Emmer, MD;  Location: [facility] ENDOSCOPY;  Service: Cardiovascular;  Laterality: N/A;   CARDIOVERSION N/A 05/11/2016   Procedure: CARDIOVERSION;  Surgeon: Jerel Balding, MD;  Location: [facility] ENDOSCOPY;  Service: Cardiovascular;  Laterality: N/A;   CARDIOVERSION N/A 05/24/2017   Procedure: CARDIOVERSION;  Surgeon: Shlomo Wilbert SAUNDERS, MD;  Location: [facility] ENDOSCOPY;  Service: Cardiovascular;  Laterality: N/A;   CARDIOVERSION N/A 11/19/2017   Procedure: CARDIOVERSION;  Surgeon: Mona Vinie JAYSON, MD;  Location: [facility] ENDOSCOPY;  Service: Cardiovascular;  Laterality: N/A;   CHOLECYSTECTOMY  1976   CLUB FOOT RELEASE  1968   ELECTROPHYSIOLOGIC STUDY N/A 11/30/2015   Procedure: Atrial Fibrillation Ablation;  Surgeon: Agent Kelsie, MD;  Location: [facility] INVASIVE CV LAB;  Service: Cardiovascular;  Laterality: N/A;   ROBOT ASSISTED LAPAROSCOPIC NEPHRECTOMY Right 04/27/2023   Procedure: XI RIGHT RETROPERITONEAL ROBOTIC ASSISTED LAPAROSCOPIC RADICAL NEPHRECTOMY;  Surgeon: Alvaro Ricardo KATHEE  Mickey., MD;  Location: [facility] ORS;  Service: Urology;  Laterality: Right;  180 MINUTES NEEDED FOR CASE   TEE WITHOUT CARDIOVERSION N/A 05/11/2016   Procedure: TRANSESOPHAGEAL ECHOCARDIOGRAM (TEE);  Surgeon: Jerel Balding, MD;  Location: [facility] ENDOSCOPY;  Service: Cardiovascular;  Laterality: N/A;   TONSILLECTOMY  1967   TOTAL SHOULDER ARTHROPLASTY Left 06/15/2016   Procedure: TOTAL SHOULDER ARTHROPLASTY;  Surgeon: Eva Herring, MD;  Location: [facility] OR;  Service: Orthopedics;  Laterality: Left;  Left total shoulder arthroplasty   TRANSURETHRAL RESECTION OF PROSTATE N/A 07/02/2020    Procedure: TRANSURETHRAL RESECTION OF THE PROSTATE (TURP);  Surgeon: Carolee Sherwood JONETTA DOUGLAS, MD;  Location: [facility] ORS;  Service: Urology;  Laterality: N/A;   Patient Active Problem List   Diagnosis Date Noted   Renal mass, right 04/27/2023   Hypercoagulable state due to persistent atrial fibrillation (HCC) 08/02/2022   Parkinson's disease without dyskinesia or fluctuating manifestations (HCC) 12/21/2021   BPH (benign prostatic hyperplasia) 07/02/2020   Myelopathy (HCC) 04/26/2020   Atrial flutter (HCC) 12/19/2016   S/P shoulder replacement, left 06/15/2016   A-fib (HCC) 11/30/2015   HTN (hypertension)    Hypercholesterolemia    Deafness in left ear    Atrial fibrillation (HCC)    Hypopotassemia     ONSET DATE: March 2025 REFERRING DIAG:  G20.A1 (ICD-10-CM) - Parkinson's disease without dyskinesia or fluctuating manifestations (HCC)   THERAPY DIAG:  Unsteadiness on feet  Muscle weakness (generalized)  Other symptoms and signs involving the nervous system  Difficulty in walking, not elsewhere classified  Parkinson's disease without dyskinesia or fluctuating manifestations (HCC)  Rationale for Evaluation and Treatment: Rehabilitation  SUBJECTIVE:                                                                                                                                                                                       SUBJECTIVE STATEMENT: Doing ok, no new issues EVAL: Was found to have an issue with this kidney and had surgery in March 2025 and has had decline since recovering from that procedure and has had increasing weakness, balance problems, and reduced endurance since that time. Spouse notes decreased speed in mobility, increased tripping, and freezing of gait.  Reports using stationary bike at home most days and balance exercises at home. Not able to walk outdoors as frequently due to weather and busy traffic around home.  Pt accompanied by: self, wife   PERTINENT  HISTORY: Parkinsons Disease, diagnosed November, 2023. History of cervical myelopathy from severe cervical stenosis at C5-C6             -Status post surgical intervention by Dr. Onetha in March, 2022. History of vestibular  schwannoma             -Status post surgical intervention/removal many years ago (30+), but MRI in March, 2022 demonstrated 23 x 16 x 14 mm mass, consistent with vestibular schwannoma - left total shoulder replacement PAIN:  Are you having pain? Yes: NPRS scale: 6/10 Pain location: thoracic spine Pain description: sore Aggravating factors: standing prolonged on hard surfaces Relieving factors: rest   PRECAUTIONS: Fall  WEIGHT BEARING RESTRICTIONS: No  FALLS: Has patient fallen in last 6 months? No  LIVING ENVIRONMENT: Lives with: lives with their spouse Lives in: House/apartment Stairs: a few steps to enter. Deck in back of home w/ HR Has following equipment at home: occasional use of cane  PLOF: Independent with basic ADLs  PATIENT GOALS: improve balance and stamina, get back to where I was a year ago  OBJECTIVE:   TODAY'S TREATMENT: 10/09/23 Activity Comments  LE PRE 3x10, -LAQ, 10# -alt stair taps (8 step), 10# -sidestep x 2 min, 10# -deadlift 15-25# -bent over unilat row, 15# KB -STS 15# front hold                    TODAY'S TREATMENT: 10/04/2023 Activity Comments  MiniBEST:  22/28 Improved from 19/28  66M walk backwards:  9.69 sec   TUG 11.22 sec TUG cognitive 19.56 sec   6 MWT 1110 ft Improved from 1037 ft              PATIENT EDUCATION: Education details: Progress towards goals, POC (recert completed today).  Requested pt bring in his old HEP (to compare to current HEP) to make sure he has appropriate exercises and to avoid overlap with HEP Person educated: Patient and Spouse Education method: Explanation Education comprehension: verbalized understanding    HOME EXERCISE PROGRAM: Provided handout for cycling HIIT to progress  for Pedaling for PD protocol  Access Code: [email or web address] URL: [email or web address] Date: 09/04/2023 Prepared by: Tawni Ferrier  Program Notes n/a  Exercises - Shoulder External Rotation and Scapular Retraction  - 1-2 x daily - 5 x weekly - 2 sets - 10 reps - Sit to Stand with Arm Swing  - 1-2 x daily - 5 x weekly - 2 sets - 10 reps - Active Straight Leg Raise with Quad Set  - 1-2 x daily - 5 x weekly - 2 sets - 10 reps - Sidelying Hip Abduction  - 1-2 x daily - 5 x weekly - 2 sets - 10 reps - Side Stepping with Resistance at Thighs and Counter Support  - 1 x daily - 7 x weekly - 1-2 sets - 2 min round hold  ________________________________________________________________________________________ Note: Objective measures were completed at Evaluation unless otherwise noted.  DIAGNOSTIC FINDINGS:   COGNITION: Overall cognitive status: Within functional limits for tasks assessed   SENSATION: Not tested  COORDINATION: WFL  POSTURE: forward head  LOWER EXTREMITY ROM:    WFL  LOWER EXTREMITY MMT:   Grossly 4/5 to seated resisted tests  BED MOBILITY:  indep  TRANSFERS: independent  CURB:  Findings: independent  STAIRS: Not tested GAIT: Findings: Distance walked:   and Comments: WFL, deficits during turning R vs L  FUNCTIONAL TESTS:  5 times sit to stand: 10 sec 10 meter walk test: 11.84 sec = 2.7 ft/sec  Mini-BESTest: 19/28 3 meter backwards walk test: 8.3 sec    ______________________________________________________________________________________  GOALS: Goals reviewed with patient? Yes  SHORT TERM GOALS: Target date: 09/17/2023   Patient will be independent in HEP to  improve functional outcomes Baseline: Goal status: MET per reports  2.  Demo improved postural stability per mild-mod sway x 30 sec condition 4 M-CTSIB to improve safety with ADL Baseline: mod-severe x 10 sec Goal status: MET-- Can hold x 30 seconds on 09/25/23  LONG TERM  GOALS: Target date: 10/08/2023>11/02/23   Patient to ambulate at least 1500 ft during in order to improve functional endurance.  Baseline: 1037 ft 09/06/23> 1110 ft 10/04/2023 Goal status: IN PROGRESS 10/04/2023  2.  Demo improved balance, motor control, and reduced risk for falls per score 24/28 Mini-BESTest Baseline: 19/28>22/28 10/04/2023 Goal status: IN PROGRESS 10/04/2023  3.  Demo reduced risk for falls per time 4.8 sec 3 meter Backwards Walk Test Baseline: 8.3 sec>9.69 sec 10/04/2023 Goal status: IN PROGRESS 10/04/2023  4.  Report improved activity tolerance as evidenced by ability to participate in community activities, e.g. shopping, exercise class, outings with friends, etc Baseline:  10/02/23: Pt reports he's been able to do a lot more -- able to walk around [employer] Goal status: IN PROGRESS  5.  Pt to report improved thoracic pain as to not limiting standing ADL/housekeeping tasks to improve activity tolerance  Baseline: 6/10 thoracic pain with prolonged standing  10/02/23: Still 6/10  Goal status: IN PROGRESS   ASSESSMENT:  CLINICAL IMPRESSION: Pt and spouse instructed in seated/standing open and closed chain resistance exercises to improve strength and conditioning for improved activity tolerance.  Good tolerance throughout session w/ RPE of 6/10 during activities.  Pt would benefit from continued sessions to address deficits and limitations to improve mobility and reduce risk for falls.   EVAL: Patient is a 68 y.o. male who was seen today for physical therapy evaluation and treatment for Parkinson's Disease.  Pt reports remote hx of surgery and experiencing functional decline since that time w/ decrease in strength, activity tolerance, balance, and gait deviations.  Demonstrates difficulty with postural control and balance as evidenced by score 19/28 Mini-BESTest indicating increased risk for falls as well as with .  Pt reports activity limitations not able to ambulate  community-level distances due to fatigue and balance issues. Would benefit from PT services to address deficits and limitations to improve functional mobility and reduce risk for falls.    OBJECTIVE IMPAIRMENTS: decreased activity tolerance, decreased balance, decreased endurance, decreased knowledge of use of DME, difficulty walking, decreased strength, and pain.   PLAN:  PT FREQUENCY: 1-2x/week  PT DURATION: 4 weeks, per recert 10/04/2023  PLANNED INTERVENTIONS: 97750- Physical Performance Testing, 97110-Therapeutic exercises, 97530- Therapeutic activity, 97112- Neuromuscular re-education, 97535- Self Care, [id number]- Manual therapy, [id number]- Gait training, [id number]- Orthotic Initial, [id number]- Orthotic/Prosthetic subsequent, [phone]- Canalith repositioning, and [phone]- Aquatic Therapy  PLAN FOR NEXT SESSION:  Continue BLE strengthening, HEP review-- treadmill training, bike HIIT for home performance, BLE strengthening, postural training, PWR! Exercises (check to see which ones pt has at [facility] Prone PWR! Or supine PWR! Moves be appropriate?)* Pt to bring in HEP from previous bouts of therapy to check*  Darrell Hauk K Melyssa Signor, PT 10/09/23 11:01 AM [facility] [address] [place], [address] [place], [place] [address] Phone # [phone] Fax # [phone]

## 2023-10-10 NOTE — Therapy (Signed)
 OUTPATIENT PHYSICAL THERAPY NEURO TREATMENT NOTE      Patient Name: Patrick Gray MRN: [id number] DOB:1955/08/15, 68 y.o., male Today's Date: 10/11/2023  PCP: Seabron Lenis, MD REFERRING PROVIDER: Evonnie Asberry RAMAN, DO  END OF SESSION:  PT End of Session - 10/11/23 1441     Visit Number 13    Number of Visits 19    Date for PT Re-Evaluation 11/02/23    Authorization Type [employer]-reauth requested    Authorization Time Period total of 20 visits approved ffom 08/27/23 to 11/02/23    Authorization - Visit Number 13    Authorization - Number of Visits 20    Progress Note Due on Visit 20    PT Start Time 1358    PT Stop Time 1443    PT Time Calculation (min) 45 min    Equipment Utilized During Treatment Gait belt    Activity Tolerance Patient tolerated treatment well    Behavior During Therapy WFL for tasks assessed/performed              Past Medical History:  Diagnosis Date   Acoustic neuroma (HCC)    left   Anxiety    Arthritis    Complication of anesthesia    Delusion, violent, hallucination, hospitalized 8 days, urine retention   Coronary artery disease    Deafness in left ear    Delusion (HCC)    after fall on pain meds and prednisone   Dysrhythmia    PAF   Foley catheter in place    H/O cardiac radiofrequency ablation    HTN (hypertension)    Hypercholesterolemia    Hypopotassemia    Insomnia    Parkinson's disease (HCC)    Paroxysmal atrial fibrillation (HCC)    Radiculopathy    after fall   Tremor    left hand   Vitamin B 12 deficiency    Past Surgical History:  Procedure Laterality Date   ACOUSTIC NEUROMA RESECTION Left 19990   ANTERIOR CERVICAL DECOMP/DISCECTOMY FUSION N/A 04/26/2020   Procedure: Anterior Cervical Decompression Fusion - Cervical five-Cervical six - Cervical six-Cervical seven;  Surgeon: Onetha Kuba, MD;  Location: [facility] OR;  Service: Neurosurgery;  Laterality: N/A;   APPENDECTOMY  1976   ATRIAL FIBRILLATION ABLATION N/A  12/19/2016   Procedure: ATRIAL FIBRILLATION ABLATION;  Surgeon: Kelsie Agent, MD;  Location: [facility] INVASIVE CV LAB;  Service: Cardiovascular;  Laterality: N/A;   CARDIAC CATHETERIZATION N/A 10/07/2015   Procedure: Left Heart Cath and Coronary Angiography;  Surgeon: Maude JAYSON Emmer, MD;  Location: [facility] INVASIVE CV LAB;  Service: Cardiovascular;  Laterality: N/A;   CARDIOVERSION N/A 06/17/2013   Procedure: CARDIOVERSION;  Surgeon: Maude JAYSON Emmer, MD;  Location: [facility] ENDOSCOPY;  Service: Cardiovascular;  Laterality: N/A;   CARDIOVERSION N/A 05/11/2016   Procedure: CARDIOVERSION;  Surgeon: Jerel Balding, MD;  Location: [facility] ENDOSCOPY;  Service: Cardiovascular;  Laterality: N/A;   CARDIOVERSION N/A 05/24/2017   Procedure: CARDIOVERSION;  Surgeon: Shlomo Wilbert SAUNDERS, MD;  Location: [facility] ENDOSCOPY;  Service: Cardiovascular;  Laterality: N/A;   CARDIOVERSION N/A 11/19/2017   Procedure: CARDIOVERSION;  Surgeon: Mona Vinie JAYSON, MD;  Location: [facility] ENDOSCOPY;  Service: Cardiovascular;  Laterality: N/A;   CHOLECYSTECTOMY  1976   CLUB FOOT RELEASE  1968   ELECTROPHYSIOLOGIC STUDY N/A 11/30/2015   Procedure: Atrial Fibrillation Ablation;  Surgeon: Agent Kelsie, MD;  Location: [facility] INVASIVE CV LAB;  Service: Cardiovascular;  Laterality: N/A;   ROBOT ASSISTED LAPAROSCOPIC NEPHRECTOMY Right 04/27/2023   Procedure: XI RIGHT RETROPERITONEAL  ROBOTIC ASSISTED LAPAROSCOPIC RADICAL NEPHRECTOMY;  Surgeon: Alvaro Ricardo KATHEE Mickey., MD;  Location: [facility] ORS;  Service: Urology;  Laterality: Right;  180 MINUTES NEEDED FOR CASE   TEE WITHOUT CARDIOVERSION N/A 05/11/2016   Procedure: TRANSESOPHAGEAL ECHOCARDIOGRAM (TEE);  Surgeon: Jerel Balding, MD;  Location: [facility] ENDOSCOPY;  Service: Cardiovascular;  Laterality: N/A;   TONSILLECTOMY  1967   TOTAL SHOULDER ARTHROPLASTY Left 06/15/2016   Procedure: TOTAL SHOULDER ARTHROPLASTY;  Surgeon: Eva Herring, MD;  Location: [facility] OR;  Service: Orthopedics;  Laterality: Left;  Left total shoulder arthroplasty    TRANSURETHRAL RESECTION OF PROSTATE N/A 07/02/2020   Procedure: TRANSURETHRAL RESECTION OF THE PROSTATE (TURP);  Surgeon: Carolee Sherwood JONETTA DOUGLAS, MD;  Location: [facility] ORS;  Service: Urology;  Laterality: N/A;   Patient Active Problem List   Diagnosis Date Noted   Renal mass, right 04/27/2023   Hypercoagulable state due to persistent atrial fibrillation (HCC) 08/02/2022   Parkinson's disease without dyskinesia or fluctuating manifestations (HCC) 12/21/2021   BPH (benign prostatic hyperplasia) 07/02/2020   Myelopathy (HCC) 04/26/2020   Atrial flutter (HCC) 12/19/2016   S/P shoulder replacement, left 06/15/2016   A-fib (HCC) 11/30/2015   HTN (hypertension)    Hypercholesterolemia    Deafness in left ear    Atrial fibrillation (HCC)    Hypopotassemia     ONSET DATE: March 2025 REFERRING DIAG:  G20.A1 (ICD-10-CM) - Parkinson's disease without dyskinesia or fluctuating manifestations (HCC)   THERAPY DIAG:  Unsteadiness on feet  Muscle weakness (generalized)  Other symptoms and signs involving the nervous system  Difficulty in walking, not elsewhere classified  Rationale for Evaluation and Treatment: Rehabilitation  SUBJECTIVE:                                                                                                                                                                                       SUBJECTIVE STATEMENT: Pretty average.    Pt accompanied by: self  PERTINENT HISTORY: Parkinsons Disease, diagnosed November, 2023. History of cervical myelopathy from severe cervical stenosis at C5-C6             -Status post surgical intervention by Dr. Onetha in March, 2022. History of vestibular schwannoma             -Status post surgical intervention/removal many years ago (30+), but MRI in March, 2022 demonstrated 23 x 16 x 14 mm mass, consistent with vestibular schwannoma - left total shoulder replacement PAIN:  Are you having pain? Yes: NPRS scale: nothing to write home  about/10 Pain location: thoracic spine Pain description: sore Aggravating factors: standing prolonged on hard surfaces Relieving factors: rest   PRECAUTIONS: Fall  WEIGHT BEARING  RESTRICTIONS: No  FALLS: Has patient fallen in last 6 months? No  LIVING ENVIRONMENT: Lives with: lives with their spouse Lives in: House/apartment Stairs: a few steps to enter. Deck in back of home w/ HR Has following equipment at home: occasional use of cane  PLOF: Independent with basic ADLs  PATIENT GOALS: improve balance and stamina, get back to where I was a year ago  OBJECTIVE:     TODAY'S TREATMENT: 10/11/23 Activity Comments  slow gait training on treadmill with cues for increased step length and arm swing Speed varied 1.0-1.8 mph; frequent stops to recover arm swing pattern; pt only able to coordinate R UE and required cueing   Over ground gait with alt arm swing Demo and verbal cueing required. Scarf in L hand for increased amplitude of swing   Standing march + arm swing Difficulty coordinating. Use metronome at ~50bpm  gait + reaching ball overhead and behind back  Good coordination after some cueing   Wall squat + ball toss 2x10  Good tolerance and form. Edu for foot placement to prevent knee pain      HOME EXERCISE PROGRAM Last updated: 10/11/23 Access Code: [email or web address] URL: [email or web address] Date: 10/11/2023 Prepared by: [facility] - Outpatient  Rehab - [facility]  Program Notes n/a  Exercises - Shoulder External Rotation and Scapular Retraction  - 1-2 x daily - 5 x weekly - 2 sets - 10 reps - Sit to Stand with Arm Swing  - 1-2 x daily - 5 x weekly - 2 sets - 10 reps - Active Straight Leg Raise with Quad Set  - 1-2 x daily - 5 x weekly - 2 sets - 10 reps - Sidelying Hip Abduction  - 1-2 x daily - 5 x weekly - 2 sets - 10 reps - Side Stepping with Resistance at Thighs and Counter Support  - 1 x daily - 7 x weekly - 1-2 sets - 2 min round hold - Wall Squat   - 1 x daily - 5 x weekly - 2 sets - 10 reps    PATIENT EDUCATION: Education details: HEP update, edu and handout on PD symposium  Person educated: Patient Education method: Explanation, Demonstration, Tactile cues, Verbal cues, and Handouts Education comprehension: verbalized understanding and returned demonstration   ________________________________________________________________________________________ Note: Objective measures were completed at Evaluation unless otherwise noted.  DIAGNOSTIC FINDINGS:   COGNITION: Overall cognitive status: Within functional limits for tasks assessed   SENSATION: Not tested  COORDINATION: WFL  POSTURE: forward head  LOWER EXTREMITY ROM:    WFL  LOWER EXTREMITY MMT:   Grossly 4/5 to seated resisted tests  BED MOBILITY:  indep  TRANSFERS: independent  CURB:  Findings: independent  STAIRS: Not tested GAIT: Findings: Distance walked:   and Comments: WFL, deficits during turning R vs L  FUNCTIONAL TESTS:  5 times sit to stand: 10 sec 10 meter walk test: 11.84 sec = 2.7 ft/sec  Mini-BESTest: 19/28 3 meter backwards walk test: 8.3 sec    ______________________________________________________________________________________  GOALS: Goals reviewed with patient? Yes  SHORT TERM GOALS: Target date: 09/17/2023   Patient will be independent in HEP to improve functional outcomes Baseline: Goal status: MET per reports  2.  Demo improved postural stability per mild-mod sway x 30 sec condition 4 M-CTSIB to improve safety with ADL Baseline: mod-severe x 10 sec Goal status: MET-- Can hold x 30 seconds on 09/25/23  LONG TERM GOALS: Target date: 10/08/2023>11/02/23   Patient to ambulate at least 1500 ft  during in order to improve functional endurance.  Baseline: 1037 ft 09/06/23> 1110 ft 10/04/2023 Goal status: IN PROGRESS 10/04/2023  2.  Demo improved balance, motor control, and reduced risk for falls per score 24/28  Mini-BESTest Baseline: 19/28>22/28 10/04/2023 Goal status: IN PROGRESS 10/04/2023  3.  Demo reduced risk for falls per time 4.8 sec 3 meter Backwards Walk Test Baseline: 8.3 sec>9.69 sec 10/04/2023 Goal status: IN PROGRESS 10/04/2023  4.  Report improved activity tolerance as evidenced by ability to participate in community activities, e.g. shopping, exercise class, outings with friends, etc Baseline:  10/02/23: Pt reports he's been able to do a lot more -- able to walk around [employer] Goal status: IN PROGRESS  5.  Pt to report improved thoracic pain as to not limiting standing ADL/housekeeping tasks to improve activity tolerance  Baseline: 6/10 thoracic pain with prolonged standing  10/02/23: Still 6/10  Goal status: IN PROGRESS   ASSESSMENT:  CLINICAL IMPRESSION: .  Patient arrived to session without complaints. Continued working on gait training with focus on reciprocal arm swing and continued challenging pt further with several coordination activities. Some improvement in technique and consistency with demo and metronome. Good tolerance of closed chain strengthening, thus updated into HEP. No complaints at end of session.   OBJECTIVE IMPAIRMENTS: decreased activity tolerance, decreased balance, decreased endurance, decreased knowledge of use of DME, difficulty walking, decreased strength, and pain.   PLAN:  PT FREQUENCY: 1-2x/week  PT DURATION: 4 weeks, per recert 10/04/2023  PLANNED INTERVENTIONS: 97750- Physical Performance Testing, 97110-Therapeutic exercises, 97530- Therapeutic activity, 97112- Neuromuscular re-education, 97535- Self Care, 02859- Manual therapy, [id number]- Gait training, [id number]- Orthotic Initial, [id number]- Orthotic/Prosthetic subsequent, [id number]- Canalith repositioning, and [phone]- Aquatic Therapy  PLAN FOR NEXT SESSION:  Continue BLE strengthening, HEP review-- treadmill training, bike HIIT for home performance, BLE strengthening, postural training, PWR! Exercises (check to  see which ones pt has at [facility] Prone PWR! Or supine PWR! Moves be appropriate?)* Pt to bring in HEP from previous bouts of therapy to check*    Louana Terrilyn Christians, PT, DPT 10/11/23 2:45 PM   Outpatient Rehab at [facility] [address] [place], [address] [place], [place] [address] Phone # [phone] Fax # [phone]

## 2023-10-11 ENCOUNTER — Ambulatory Visit: Admitting: Physical Therapy

## 2023-10-11 ENCOUNTER — Encounter: Payer: Self-pay | Admitting: Physical Therapy

## 2023-10-11 DIAGNOSIS — R262 Difficulty in walking, not elsewhere classified: Secondary | ICD-10-CM | POA: Diagnosis not present

## 2023-10-11 DIAGNOSIS — R2681 Unsteadiness on feet: Secondary | ICD-10-CM

## 2023-10-11 DIAGNOSIS — M6281 Muscle weakness (generalized): Secondary | ICD-10-CM

## 2023-10-11 DIAGNOSIS — R29818 Other symptoms and signs involving the nervous system: Secondary | ICD-10-CM

## 2023-10-11 DIAGNOSIS — G20A1 Parkinson's disease without dyskinesia, without mention of fluctuations: Secondary | ICD-10-CM | POA: Diagnosis not present

## 2023-10-11 DIAGNOSIS — R278 Other lack of coordination: Secondary | ICD-10-CM | POA: Diagnosis not present

## 2023-10-16 ENCOUNTER — Encounter: Payer: Self-pay | Admitting: Physical Therapy

## 2023-10-16 ENCOUNTER — Ambulatory Visit: Admitting: Physical Therapy

## 2023-10-16 DIAGNOSIS — R29818 Other symptoms and signs involving the nervous system: Secondary | ICD-10-CM | POA: Diagnosis not present

## 2023-10-16 DIAGNOSIS — M6281 Muscle weakness (generalized): Secondary | ICD-10-CM | POA: Diagnosis not present

## 2023-10-16 DIAGNOSIS — G20A1 Parkinson's disease without dyskinesia, without mention of fluctuations: Secondary | ICD-10-CM | POA: Diagnosis not present

## 2023-10-16 DIAGNOSIS — R2681 Unsteadiness on feet: Secondary | ICD-10-CM

## 2023-10-16 DIAGNOSIS — R278 Other lack of coordination: Secondary | ICD-10-CM | POA: Diagnosis not present

## 2023-10-16 DIAGNOSIS — R262 Difficulty in walking, not elsewhere classified: Secondary | ICD-10-CM | POA: Diagnosis not present

## 2023-10-16 NOTE — Therapy (Signed)
 OUTPATIENT PHYSICAL THERAPY NEURO TREATMENT NOTE      Patient Name: Patrick Gray MRN: [id number] DOB:02-08-1956, 68 y.o., male Today's Date: 10/16/2023  PCP: Seabron Lenis, MD REFERRING PROVIDER: Evonnie Asberry RAMAN, DO  END OF SESSION:  PT End of Session - 10/16/23 1219     Visit Number 14    Number of Visits 19    Date for PT Re-Evaluation 11/02/23    Authorization Type [employer]-reauth requested    Authorization Time Period total of 20 visits approved ffom 08/27/23 to 11/02/23    Authorization - Visit Number 14    Authorization - Number of Visits 20    Progress Note Due on Visit 20    PT Start Time 1231    PT Stop Time 1314    PT Time Calculation (min) 43 min    Equipment Utilized During Treatment Gait belt    Activity Tolerance Patient tolerated treatment well    Behavior During Therapy WFL for tasks assessed/performed              Past Medical History:  Diagnosis Date   Acoustic neuroma (HCC)    left   Anxiety    Arthritis    Complication of anesthesia    Delusion, violent, hallucination, hospitalized 8 days, urine retention   Coronary artery disease    Deafness in left ear    Delusion (HCC)    after fall on pain meds and prednisone   Dysrhythmia    PAF   Foley catheter in place    H/O cardiac radiofrequency ablation    HTN (hypertension)    Hypercholesterolemia    Hypopotassemia    Insomnia    Parkinson's disease (HCC)    Paroxysmal atrial fibrillation (HCC)    Radiculopathy    after fall   Tremor    left hand   Vitamin B 12 deficiency    Past Surgical History:  Procedure Laterality Date   ACOUSTIC NEUROMA RESECTION Left 19990   ANTERIOR CERVICAL DECOMP/DISCECTOMY FUSION N/A 04/26/2020   Procedure: Anterior Cervical Decompression Fusion - Cervical five-Cervical six - Cervical six-Cervical seven;  Surgeon: Onetha Kuba, MD;  Location: [facility] OR;  Service: Neurosurgery;  Laterality: N/A;   APPENDECTOMY  1976   ATRIAL FIBRILLATION ABLATION N/A  12/19/2016   Procedure: ATRIAL FIBRILLATION ABLATION;  Surgeon: Kelsie Agent, MD;  Location: [facility] INVASIVE CV LAB;  Service: Cardiovascular;  Laterality: N/A;   CARDIAC CATHETERIZATION N/A 10/07/2015   Procedure: Left Heart Cath and Coronary Angiography;  Surgeon: Maude JAYSON Emmer, MD;  Location: [facility] INVASIVE CV LAB;  Service: Cardiovascular;  Laterality: N/A;   CARDIOVERSION N/A 06/17/2013   Procedure: CARDIOVERSION;  Surgeon: Maude JAYSON Emmer, MD;  Location: [facility] ENDOSCOPY;  Service: Cardiovascular;  Laterality: N/A;   CARDIOVERSION N/A 05/11/2016   Procedure: CARDIOVERSION;  Surgeon: Jerel Balding, MD;  Location: [facility] ENDOSCOPY;  Service: Cardiovascular;  Laterality: N/A;   CARDIOVERSION N/A 05/24/2017   Procedure: CARDIOVERSION;  Surgeon: Shlomo Wilbert SAUNDERS, MD;  Location: [facility] ENDOSCOPY;  Service: Cardiovascular;  Laterality: N/A;   CARDIOVERSION N/A 11/19/2017   Procedure: CARDIOVERSION;  Surgeon: Mona Vinie JAYSON, MD;  Location: [facility] ENDOSCOPY;  Service: Cardiovascular;  Laterality: N/A;   CHOLECYSTECTOMY  1976   CLUB FOOT RELEASE  1968   ELECTROPHYSIOLOGIC STUDY N/A 11/30/2015   Procedure: Atrial Fibrillation Ablation;  Surgeon: Agent Kelsie, MD;  Location: [facility] INVASIVE CV LAB;  Service: Cardiovascular;  Laterality: N/A;   ROBOT ASSISTED LAPAROSCOPIC NEPHRECTOMY Right 04/27/2023   Procedure: XI RIGHT RETROPERITONEAL  ROBOTIC ASSISTED LAPAROSCOPIC RADICAL NEPHRECTOMY;  Surgeon: Alvaro Ricardo KATHEE Mickey., MD;  Location: [facility] ORS;  Service: Urology;  Laterality: Right;  180 MINUTES NEEDED FOR CASE   TEE WITHOUT CARDIOVERSION N/A 05/11/2016   Procedure: TRANSESOPHAGEAL ECHOCARDIOGRAM (TEE);  Surgeon: Jerel Balding, MD;  Location: [facility] ENDOSCOPY;  Service: Cardiovascular;  Laterality: N/A;   TONSILLECTOMY  1967   TOTAL SHOULDER ARTHROPLASTY Left 06/15/2016   Procedure: TOTAL SHOULDER ARTHROPLASTY;  Surgeon: Eva Herring, MD;  Location: [facility] OR;  Service: Orthopedics;  Laterality: Left;  Left total shoulder arthroplasty    TRANSURETHRAL RESECTION OF PROSTATE N/A 07/02/2020   Procedure: TRANSURETHRAL RESECTION OF THE PROSTATE (TURP);  Surgeon: Carolee Sherwood JONETTA DOUGLAS, MD;  Location: [facility] ORS;  Service: Urology;  Laterality: N/A;   Patient Active Problem List   Diagnosis Date Noted   Renal mass, right 04/27/2023   Hypercoagulable state due to persistent atrial fibrillation (HCC) 08/02/2022   Parkinson's disease without dyskinesia or fluctuating manifestations (HCC) 12/21/2021   BPH (benign prostatic hyperplasia) 07/02/2020   Myelopathy (HCC) 04/26/2020   Atrial flutter (HCC) 12/19/2016   S/P shoulder replacement, left 06/15/2016   A-fib (HCC) 11/30/2015   HTN (hypertension)    Hypercholesterolemia    Deafness in left ear    Atrial fibrillation (HCC)    Hypopotassemia     ONSET DATE: March 2025 REFERRING DIAG:  G20.A1 (ICD-10-CM) - Parkinson's disease without dyskinesia or fluctuating manifestations (HCC)   THERAPY DIAG:  Unsteadiness on feet  Muscle weakness (generalized)  Other symptoms and signs involving the nervous system  Rationale for Evaluation and Treatment: Rehabilitation  SUBJECTIVE:                                                                                                                                                                                       SUBJECTIVE STATEMENT: Missed a medication dose last night so I'm about half way.  Trying a nicotine patch (maybe for my Parkinson's symptoms).  Having a little more tremor and I'm tired.  Pt brought in all his previous HEP.   Pt accompanied by: self  PERTINENT HISTORY: Parkinsons Disease, diagnosed November, 2023. History of cervical myelopathy from severe cervical stenosis at C5-C6             -Status post surgical intervention by Dr. Onetha in March, 2022. History of vestibular schwannoma             -Status post surgical intervention/removal many years ago (30+), but MRI in March, 2022 demonstrated 23 x 16 x 14 mm mass, consistent  with vestibular schwannoma - left total shoulder replacement PAIN:  Are you having pain? Yes: NPRS scale: nothing  to write home about/10 Pain location: thoracic spine Pain description: sore Aggravating factors: standing prolonged on hard surfaces Relieving factors: rest   PRECAUTIONS: Fall  WEIGHT BEARING RESTRICTIONS: No  FALLS: Has patient fallen in last 6 months? No  LIVING ENVIRONMENT: Lives with: lives with their spouse Lives in: House/apartment Stairs: a few steps to enter. Deck in back of home w/ HR Has following equipment at home: occasional use of cane  PLOF: Independent with basic ADLs  PATIENT GOALS: improve balance and stamina, get back to where I was a year ago  OBJECTIVE:   Brings in his folder with HEP: Standing PWR! Moves (daily) Supine SLR, hip abduction (Not really doing standing shoulder external rotation and sit to stand) Recently added:  resisted side step and wall squats   TODAY'S TREATMENT: 10/16/2023 Activity Comments  Leg strength: LAQ 3 x 10 Sidestepping x 2 min 10#  Forward/back walk x 2 min   Reviewed wall squats x 10 reps Good form-min cues for posture throughout  Minisquats to upright posture>up on toes x 10   Sit to stand with PWR! Up x 5  PWR! Rock x 5 PWR! Twist x 5 PWR! Step   PWR! Moves standing FLOW  For agility, balance          HOME EXERCISE PROGRAM Last updated: 10/11/23 Access Code: [email or web address] URL: [email or web address] Date: 10/11/2023 Prepared by: [facility] - Outpatient  Rehab - [facility]  Program Notes n/a  Exercises - Shoulder External Rotation and Scapular Retraction  - 1-2 x daily - 5 x weekly - 2 sets - 10 reps - Sit to Stand with Arm Swing  - 1-2 x daily - 5 x weekly - 2 sets - 10 reps - Active Straight Leg Raise with Quad Set  - 1-2 x daily - 5 x weekly - 2 sets - 10 reps - Sidelying Hip Abduction  - 1-2 x daily - 5 x weekly - 2 sets - 10 reps - Side Stepping with Resistance at  Thighs and Counter Support  - 1 x daily - 7 x weekly - 1-2 sets - 2 min round hold - Wall Squat  - 1 x daily - 5 x weekly - 2 sets - 10 reps    PATIENT EDUCATION: Education details: Importance of medicine on-time everytime; ways to make sure he is safe today as he missed a med dose last night and if more fatigued; stagger strenghtening and aerobic activity on alternating days to perform 3x/wk each, PWR! Moves daily Person educated: Patient Education method: Explanation, Demonstration, Tactile cues, Verbal cues, and Handouts Education comprehension: verbalized understanding and returned demonstration   ________________________________________________________________________________________ Note: Objective measures were completed at Evaluation unless otherwise noted.  DIAGNOSTIC FINDINGS:   COGNITION: Overall cognitive status: Within functional limits for tasks assessed   SENSATION: Not tested  COORDINATION: WFL  POSTURE: forward head  LOWER EXTREMITY ROM:    WFL  LOWER EXTREMITY MMT:   Grossly 4/5 to seated resisted tests  BED MOBILITY:  indep  TRANSFERS: independent  CURB:  Findings: independent  STAIRS: Not tested GAIT: Findings: Distance walked:   and Comments: WFL, deficits during turning R vs L  FUNCTIONAL TESTS:  5 times sit to stand: 10 sec 10 meter walk test: 11.84 sec = 2.7 ft/sec  Mini-BESTest: 19/28 3 meter backwards walk test: 8.3 sec    ______________________________________________________________________________________  GOALS: Goals reviewed with patient? Yes  SHORT TERM GOALS: Target date: 09/17/2023   Patient will be independent in HEP to  improve functional outcomes Baseline: Goal status: MET per reports  2.  Demo improved postural stability per mild-mod sway x 30 sec condition 4 M-CTSIB to improve safety with ADL Baseline: mod-severe x 10 sec Goal status: MET-- Can hold x 30 seconds on 09/25/23  LONG TERM GOALS: Target date:  10/08/2023>11/02/23   Patient to ambulate at least 1500 ft during in order to improve functional endurance.  Baseline: 1037 ft 09/06/23> 1110 ft 10/04/2023 Goal status: IN PROGRESS 10/04/2023  2.  Demo improved balance, motor control, and reduced risk for falls per score 24/28 Mini-BESTest Baseline: 19/28>22/28 10/04/2023 Goal status: IN PROGRESS 10/04/2023  3.  Demo reduced risk for falls per time 4.8 sec 3 meter Backwards Walk Test Baseline: 8.3 sec>9.69 sec 10/04/2023 Goal status: IN PROGRESS 10/04/2023  4.  Report improved activity tolerance as evidenced by ability to participate in community activities, e.g. shopping, exercise class, outings with friends, etc Baseline:  10/02/23: Pt reports he's been able to do a lot more -- able to walk around [employer] Goal status: IN PROGRESS  5.  Pt to report improved thoracic pain as to not limiting standing ADL/housekeeping tasks to improve activity tolerance  Baseline: 6/10 thoracic pain with prolonged standing  10/02/23: Still 6/10  Goal status: IN PROGRESS   ASSESSMENT:  CLINICAL IMPRESSION: Pt presents today and reports he feels more fatigued than typical due to missing a PD med dose last night (this is unusual for him and he has worked to ensure with setting up an alarm that he wouldn't miss again).  Skilled PT session focused on review of his HEP and strengthening. Discussed ways to stagger strengthening and aerobic activity to perform each 3x/wk on alternating days.  He does appear more bradykinetic and slightly more off balance today; he is doing a good job with trying to sequence his arm swing.  Pt will continue to benefit from skilled PT towards goals for improved functional mobility and decreased fall risk.    OBJECTIVE IMPAIRMENTS: decreased activity tolerance, decreased balance, decreased endurance, decreased knowledge of use of DME, difficulty walking, decreased strength, and pain.   PLAN:  PT FREQUENCY: 1-2x/week  PT DURATION: 4  weeks, per recert 10/04/2023  PLANNED INTERVENTIONS: 97750- Physical Performance Testing, 97110-Therapeutic exercises, 97530- Therapeutic activity, 97112- Neuromuscular re-education, 97535- Self Care, [id number]- Manual therapy, [id number]- Gait training, [phone]- Orthotic Initial, [id number]- Orthotic/Prosthetic subsequent, [phone]- Canalith repositioning, and [phone]- Aquatic Therapy  PLAN FOR NEXT SESSION:  Continue BLE strengthening-- treadmill training, bike HIIT for home performance, postural training, PWR! Exercises (check to see which ones pt has at [facility] PWR! Or supine PWR! Moves be appropriate?).  Continue to progress towards LTGs.    Greig Anon, PT 10/16/23 1:17 PM Phone: [phone] Fax: [phone]   [facility] at [facility] [address] [place], [address] [place], [place] [address] Phone # [phone] Fax # [phone]

## 2023-10-17 NOTE — Therapy (Signed)
 OUTPATIENT PHYSICAL THERAPY NEURO TREATMENT NOTE      Patient Name: Patrick Gray MRN: [id number] DOB:09/24/1955, 68 y.o., male Today's Date: 10/18/2023  PCP: Seabron Lenis, MD REFERRING PROVIDER: Evonnie Asberry RAMAN, DO  END OF SESSION:  PT End of Session - 10/18/23 1014     Visit Number 15    Number of Visits 19    Date for PT Re-Evaluation 11/02/23    Authorization Type [employer]-reauth requested    Authorization Time Period total of 20 visits approved ffom 08/27/23 to 11/02/23    Authorization - Visit Number 15    Authorization - Number of Visits 20    Progress Note Due on Visit 20    PT Start Time 0929    PT Stop Time 1013    PT Time Calculation (min) 44 min    Equipment Utilized During Treatment Gait belt    Activity Tolerance Patient tolerated treatment well    Behavior During Therapy WFL for tasks assessed/performed               Past Medical History:  Diagnosis Date   Acoustic neuroma (HCC)    left   Anxiety    Arthritis    Complication of anesthesia    Delusion, violent, hallucination, hospitalized 8 days, urine retention   Coronary artery disease    Deafness in left ear    Delusion (HCC)    after fall on pain meds and prednisone   Dysrhythmia    PAF   Foley catheter in place    H/O cardiac radiofrequency ablation    HTN (hypertension)    Hypercholesterolemia    Hypopotassemia    Insomnia    Parkinson's disease (HCC)    Paroxysmal atrial fibrillation (HCC)    Radiculopathy    after fall   Tremor    left hand   Vitamin B 12 deficiency    Past Surgical History:  Procedure Laterality Date   ACOUSTIC NEUROMA RESECTION Left 19990   ANTERIOR CERVICAL DECOMP/DISCECTOMY FUSION N/A 04/26/2020   Procedure: Anterior Cervical Decompression Fusion - Cervical five-Cervical six - Cervical six-Cervical seven;  Surgeon: Onetha Kuba, MD;  Location: [facility] OR;  Service: Neurosurgery;  Laterality: N/A;   APPENDECTOMY  1976   ATRIAL FIBRILLATION ABLATION N/A  12/19/2016   Procedure: ATRIAL FIBRILLATION ABLATION;  Surgeon: Kelsie Agent, MD;  Location: [facility] INVASIVE CV LAB;  Service: Cardiovascular;  Laterality: N/A;   CARDIAC CATHETERIZATION N/A 10/07/2015   Procedure: Left Heart Cath and Coronary Angiography;  Surgeon: Maude JAYSON Emmer, MD;  Location: [facility] INVASIVE CV LAB;  Service: Cardiovascular;  Laterality: N/A;   CARDIOVERSION N/A 06/17/2013   Procedure: CARDIOVERSION;  Surgeon: Maude JAYSON Emmer, MD;  Location: [facility] ENDOSCOPY;  Service: Cardiovascular;  Laterality: N/A;   CARDIOVERSION N/A 05/11/2016   Procedure: CARDIOVERSION;  Surgeon: Jerel Balding, MD;  Location: [facility] ENDOSCOPY;  Service: Cardiovascular;  Laterality: N/A;   CARDIOVERSION N/A 05/24/2017   Procedure: CARDIOVERSION;  Surgeon: Shlomo Wilbert SAUNDERS, MD;  Location: [facility] ENDOSCOPY;  Service: Cardiovascular;  Laterality: N/A;   CARDIOVERSION N/A 11/19/2017   Procedure: CARDIOVERSION;  Surgeon: Mona Vinie JAYSON, MD;  Location: [facility] ENDOSCOPY;  Service: Cardiovascular;  Laterality: N/A;   CHOLECYSTECTOMY  1976   CLUB FOOT RELEASE  1968   ELECTROPHYSIOLOGIC STUDY N/A 11/30/2015   Procedure: Atrial Fibrillation Ablation;  Surgeon: Agent Kelsie, MD;  Location: [facility] INVASIVE CV LAB;  Service: Cardiovascular;  Laterality: N/A;   ROBOT ASSISTED LAPAROSCOPIC NEPHRECTOMY Right 04/27/2023   Procedure: XI RIGHT  RETROPERITONEAL ROBOTIC ASSISTED LAPAROSCOPIC RADICAL NEPHRECTOMY;  Surgeon: Alvaro Ricardo KATHEE Mickey., MD;  Location: [facility] ORS;  Service: Urology;  Laterality: Right;  180 MINUTES NEEDED FOR CASE   TEE WITHOUT CARDIOVERSION N/A 05/11/2016   Procedure: TRANSESOPHAGEAL ECHOCARDIOGRAM (TEE);  Surgeon: Jerel Balding, MD;  Location: [facility] ENDOSCOPY;  Service: Cardiovascular;  Laterality: N/A;   TONSILLECTOMY  1967   TOTAL SHOULDER ARTHROPLASTY Left 06/15/2016   Procedure: TOTAL SHOULDER ARTHROPLASTY;  Surgeon: Eva Herring, MD;  Location: [facility] OR;  Service: Orthopedics;  Laterality: Left;  Left total shoulder arthroplasty    TRANSURETHRAL RESECTION OF PROSTATE N/A 07/02/2020   Procedure: TRANSURETHRAL RESECTION OF THE PROSTATE (TURP);  Surgeon: Carolee Sherwood JONETTA DOUGLAS, MD;  Location: [facility] ORS;  Service: Urology;  Laterality: N/A;   Patient Active Problem List   Diagnosis Date Noted   Renal mass, right 04/27/2023   Hypercoagulable state due to persistent atrial fibrillation (HCC) 08/02/2022   Parkinson's disease without dyskinesia or fluctuating manifestations (HCC) 12/21/2021   BPH (benign prostatic hyperplasia) 07/02/2020   Myelopathy (HCC) 04/26/2020   Atrial flutter (HCC) 12/19/2016   S/P shoulder replacement, left 06/15/2016   A-fib (HCC) 11/30/2015   HTN (hypertension)    Hypercholesterolemia    Deafness in left ear    Atrial fibrillation (HCC)    Hypopotassemia     ONSET DATE: March 2025 REFERRING DIAG:  G20.A1 (ICD-10-CM) - Parkinson's disease without dyskinesia or fluctuating manifestations (HCC)   THERAPY DIAG:  Unsteadiness on feet  Muscle weakness (generalized)  Other symptoms and signs involving the nervous system  Difficulty in walking, not elsewhere classified  Rationale for Evaluation and Treatment: Rehabilitation  SUBJECTIVE:                                                                                                                                                                                       SUBJECTIVE STATEMENT: It's too early for me. Took a walk yesterday and have a little catch in my knee from it.   Pt accompanied by: self, wife  PERTINENT HISTORY: Parkinsons Disease, diagnosed November, 2023. History of cervical myelopathy from severe cervical stenosis at C5-C6             -Status post surgical intervention by Dr. Onetha in March, 2022. History of vestibular schwannoma             -Status post surgical intervention/removal many years ago (30+), but MRI in March, 2022 demonstrated 23 x 16 x 14 mm mass, consistent with vestibular schwannoma - left total shoulder  replacement PAIN:  Are you having pain? Yes: NPRS scale: 0/10 Pain location: thoracic spine Pain description: sore Aggravating factors: standing  prolonged on hard surfaces Relieving factors: rest   PRECAUTIONS: Fall  WEIGHT BEARING RESTRICTIONS: No  FALLS: Has patient fallen in last 6 months? No  LIVING ENVIRONMENT: Lives with: lives with their spouse Lives in: House/apartment Stairs: a few steps to enter. Deck in back of home w/ HR Has following equipment at home: occasional use of cane  PLOF: Independent with basic ADLs  PATIENT GOALS: improve balance and stamina, get back to where I was a year ago  OBJECTIVE:     TODAY'S TREATMENT: 10/18/23 Activity Comments  supine PWR moves: up rock twist step Verbal and manual cueing for proper positioning. Cues for pacing  prone PWR moves: up rock twist step Modified PWR rock, twist, step d/t shoulder limitations. Provided eye boost to maintain extended position   step up + opposite SKTC B UE support; therapist calling out pace to assist with pacing      PATIENT EDUCATION: Education details: discussed HEP- pt reports that he is performing standing PWR moves and started performing with alternating STS, HEP update  Person educated: Patient and Spouse Education method: Explanation, Demonstration, Tactile cues, Verbal cues, and Handouts Education comprehension: verbalized understanding and returned demonstration    HOME EXERCISE PROGRAM Access Code: [email or web address] URL: [email or web address] Date: 10/18/2023 Prepared by: [facility] Outpatient  Rehab - [facility]  Program Notes n/a  Exercises - Shoulder External Rotation and Scapular Retraction  - 1-2 x daily - 5 x weekly - 2 sets - 10 reps - Sit to Stand with Arm Swing  - 1-2 x daily - 5 x weekly - 2 sets - 10 reps - Active Straight Leg Raise with Quad Set  - 1-2 x daily - 5 x weekly - 2 sets - 10 reps - Sidelying Hip Abduction  - 1-2 x daily - 5 x  weekly - 2 sets - 10 reps - Side Stepping with Resistance at Thighs and Counter Support  - 1 x daily - 7 x weekly - 1-2 sets - 2 min round hold - Wall Squat  - 1 x daily - 5 x weekly - 2 sets - 10 reps - Prone on Elbows Stretch  - 1 x daily - 5 x weekly - 2 sets - 10 reps    PATIENT EDUCATION: Education details: Importance of medicine on-time everytime; ways to make sure he is safe today as he missed a med dose last night and if more fatigued; stagger strenghtening and aerobic activity on alternating days to perform 3x/wk each, PWR! Moves daily Person educated: Patient Education method: Explanation, Demonstration, Tactile cues, Verbal cues, and Handouts Education comprehension: verbalized understanding and returned demonstration   ________________________________________________________________________________________ Note: Objective measures were completed at Evaluation unless otherwise noted.  DIAGNOSTIC FINDINGS:   COGNITION: Overall cognitive status: Within functional limits for tasks assessed   SENSATION: Not tested  COORDINATION: WFL  POSTURE: forward head  LOWER EXTREMITY ROM:    WFL  LOWER EXTREMITY MMT:   Grossly 4/5 to seated resisted tests  BED MOBILITY:  indep  TRANSFERS: independent  CURB:  Findings: independent  STAIRS: Not tested GAIT: Findings: Distance walked:   and Comments: WFL, deficits during turning R vs L  FUNCTIONAL TESTS:  5 times sit to stand: 10 sec 10 meter walk test: 11.84 sec = 2.7 ft/sec  Mini-BESTest: 19/28 3 meter backwards walk test: 8.3 sec    ______________________________________________________________________________________  GOALS: Goals reviewed with patient? Yes  SHORT TERM GOALS: Target date: 09/17/2023   Patient will be independent in HEP  to improve functional outcomes Baseline: Goal status: MET per reports  2.  Demo improved postural stability per mild-mod sway x 30 sec condition 4 M-CTSIB to improve  safety with ADL Baseline: mod-severe x 10 sec Goal status: MET-- Can hold x 30 seconds on 09/25/23  LONG TERM GOALS: Target date: 10/08/2023>11/02/23   Patient to ambulate at least 1500 ft during in order to improve functional endurance.  Baseline: 1037 ft 09/06/23> 1110 ft 10/04/2023 Goal status: IN PROGRESS 10/04/2023  2.  Demo improved balance, motor control, and reduced risk for falls per score 24/28 Mini-BESTest Baseline: 19/28>22/28 10/04/2023 Goal status: IN PROGRESS 10/04/2023  3.  Demo reduced risk for falls per time 4.8 sec 3 meter Backwards Walk Test Baseline: 8.3 sec>9.69 sec 10/04/2023 Goal status: IN PROGRESS 10/04/2023  4.  Report improved activity tolerance as evidenced by ability to participate in community activities, e.g. shopping, exercise class, outings with friends, etc Baseline:  10/02/23: Pt reports he's been able to do a lot more -- able to walk around [employer] Goal status: IN PROGRESS  5.  Pt to report improved thoracic pain as to not limiting standing ADL/housekeeping tasks to improve activity tolerance  Baseline: 6/10 thoracic pain with prolonged standing  10/02/23: Still 6/10  Goal status: IN PROGRESS   ASSESSMENT:  CLINICAL IMPRESSION: Patient performed PWR moves in supine and prone today to improve posture, maintain mobility with bed mobility, and improve spinal joint mobility/address rigidity. Modifications provided to ensure shoulder comfort and patient tolerated these positions/movements quite well. updated prone PWR up into HEP and pt reported understanding. No complaints upon leaving.     OBJECTIVE IMPAIRMENTS: decreased activity tolerance, decreased balance, decreased endurance, decreased knowledge of use of DME, difficulty walking, decreased strength, and pain.   PLAN:  PT FREQUENCY: 1-2x/week  PT DURATION: 4 weeks, per recert 10/04/2023  PLANNED INTERVENTIONS: 97750- Physical Performance Testing, 97110-Therapeutic exercises, 97530- Therapeutic  activity, 97112- Neuromuscular re-education, 97535- Self Care, 02859- Manual therapy, [id number]- Gait training, [phone]- Orthotic Initial, [id number]- Orthotic/Prosthetic subsequent, [phone]- Canalith repositioning, and [phone]- Aquatic Therapy  PLAN FOR NEXT SESSION:  Continue BLE strengthening-- treadmill training, bike HIIT for home performance, postural training, PWR! Exercises Continue to progress towards LTGs.   Louana Terrilyn Christians, PT, DPT 10/18/23 10:17 AM  [facility] [address] [place], [address] [place], [place] [address] Phone # [phone] Fax # [phone]

## 2023-10-18 ENCOUNTER — Encounter: Payer: Self-pay | Admitting: Physical Therapy

## 2023-10-18 ENCOUNTER — Ambulatory Visit: Admitting: Physical Therapy

## 2023-10-18 DIAGNOSIS — R29818 Other symptoms and signs involving the nervous system: Secondary | ICD-10-CM

## 2023-10-18 DIAGNOSIS — M6281 Muscle weakness (generalized): Secondary | ICD-10-CM | POA: Diagnosis not present

## 2023-10-18 DIAGNOSIS — R2681 Unsteadiness on feet: Secondary | ICD-10-CM | POA: Diagnosis not present

## 2023-10-18 DIAGNOSIS — R262 Difficulty in walking, not elsewhere classified: Secondary | ICD-10-CM | POA: Diagnosis not present

## 2023-10-18 DIAGNOSIS — G20A1 Parkinson's disease without dyskinesia, without mention of fluctuations: Secondary | ICD-10-CM | POA: Diagnosis not present

## 2023-10-18 DIAGNOSIS — R278 Other lack of coordination: Secondary | ICD-10-CM | POA: Diagnosis not present

## 2023-10-22 ENCOUNTER — Other Ambulatory Visit: Payer: Self-pay | Admitting: Physician Assistant

## 2023-10-22 ENCOUNTER — Other Ambulatory Visit: Payer: Self-pay | Admitting: Neurology

## 2023-10-22 DIAGNOSIS — G20A1 Parkinson's disease without dyskinesia, without mention of fluctuations: Secondary | ICD-10-CM

## 2023-10-23 ENCOUNTER — Encounter: Payer: Self-pay | Admitting: Physical Therapy

## 2023-10-23 ENCOUNTER — Ambulatory Visit: Attending: Neurology | Admitting: Physical Therapy

## 2023-10-23 DIAGNOSIS — M6281 Muscle weakness (generalized): Secondary | ICD-10-CM | POA: Diagnosis not present

## 2023-10-23 DIAGNOSIS — R2681 Unsteadiness on feet: Secondary | ICD-10-CM | POA: Insufficient documentation

## 2023-10-23 DIAGNOSIS — G20A1 Parkinson's disease without dyskinesia, without mention of fluctuations: Secondary | ICD-10-CM | POA: Insufficient documentation

## 2023-10-23 DIAGNOSIS — R278 Other lack of coordination: Secondary | ICD-10-CM | POA: Insufficient documentation

## 2023-10-23 DIAGNOSIS — R29818 Other symptoms and signs involving the nervous system: Secondary | ICD-10-CM | POA: Insufficient documentation

## 2023-10-23 DIAGNOSIS — R262 Difficulty in walking, not elsewhere classified: Secondary | ICD-10-CM | POA: Insufficient documentation

## 2023-10-23 NOTE — Therapy (Signed)
 OUTPATIENT PHYSICAL THERAPY NEURO TREATMENT NOTE   Patient Name: Patrick Gray MRN: [id number] DOB:02/15/1956, 68 y.o., male Today's Date: 10/23/2023  PCP: Seabron Lenis, MD REFERRING PROVIDER: Evonnie Asberry RAMAN, DO  END OF SESSION:  PT End of Session - 10/23/23 1059     Visit Number 16    Number of Visits 19    Date for PT Re-Evaluation 11/02/23    Authorization Type [employer]-reauth requested    Authorization Time Period total of 20 visits approved ffom 08/27/23 to 11/02/23    Authorization - Visit Number 16    Authorization - Number of Visits 20    Progress Note Due on Visit 20    PT Start Time 1100    PT Stop Time 1140    PT Time Calculation (min) 40 min    Equipment Utilized During Treatment Gait belt    Activity Tolerance Patient tolerated treatment well    Behavior During Therapy WFL for tasks assessed/performed            Past Medical History:  Diagnosis Date   Acoustic neuroma (HCC)    left   Anxiety    Arthritis    Complication of anesthesia    Delusion, violent, hallucination, hospitalized 8 days, urine retention   Coronary artery disease    Deafness in left ear    Delusion (HCC)    after fall on pain meds and prednisone   Dysrhythmia    PAF   Foley catheter in place    H/O cardiac radiofrequency ablation    HTN (hypertension)    Hypercholesterolemia    Hypopotassemia    Insomnia    Parkinson's disease (HCC)    Paroxysmal atrial fibrillation (HCC)    Radiculopathy    after fall   Tremor    left hand   Vitamin B 12 deficiency    Past Surgical History:  Procedure Laterality Date   ACOUSTIC NEUROMA RESECTION Left 19990   ANTERIOR CERVICAL DECOMP/DISCECTOMY FUSION N/A 04/26/2020   Procedure: Anterior Cervical Decompression Fusion - Cervical five-Cervical six - Cervical six-Cervical seven;  Surgeon: Onetha Kuba, MD;  Location: [facility] OR;  Service: Neurosurgery;  Laterality: N/A;   APPENDECTOMY  1976   ATRIAL FIBRILLATION ABLATION N/A 12/19/2016    Procedure: ATRIAL FIBRILLATION ABLATION;  Surgeon: Kelsie Agent, MD;  Location: [facility] INVASIVE CV LAB;  Service: Cardiovascular;  Laterality: N/A;   CARDIAC CATHETERIZATION N/A 10/07/2015   Procedure: Left Heart Cath and Coronary Angiography;  Surgeon: Maude JAYSON Emmer, MD;  Location: [facility] INVASIVE CV LAB;  Service: Cardiovascular;  Laterality: N/A;   CARDIOVERSION N/A 06/17/2013   Procedure: CARDIOVERSION;  Surgeon: Maude JAYSON Emmer, MD;  Location: [facility] ENDOSCOPY;  Service: Cardiovascular;  Laterality: N/A;   CARDIOVERSION N/A 05/11/2016   Procedure: CARDIOVERSION;  Surgeon: Jerel Balding, MD;  Location: [facility] ENDOSCOPY;  Service: Cardiovascular;  Laterality: N/A;   CARDIOVERSION N/A 05/24/2017   Procedure: CARDIOVERSION;  Surgeon: Shlomo Wilbert SAUNDERS, MD;  Location: [facility] ENDOSCOPY;  Service: Cardiovascular;  Laterality: N/A;   CARDIOVERSION N/A 11/19/2017   Procedure: CARDIOVERSION;  Surgeon: Mona Vinie JAYSON, MD;  Location: [facility] ENDOSCOPY;  Service: Cardiovascular;  Laterality: N/A;   CHOLECYSTECTOMY  1976   CLUB FOOT RELEASE  1968   ELECTROPHYSIOLOGIC STUDY N/A 11/30/2015   Procedure: Atrial Fibrillation Ablation;  Surgeon: Agent Kelsie, MD;  Location: [facility] INVASIVE CV LAB;  Service: Cardiovascular;  Laterality: N/A;   ROBOT ASSISTED LAPAROSCOPIC NEPHRECTOMY Right 04/27/2023   Procedure: XI RIGHT RETROPERITONEAL ROBOTIC ASSISTED LAPAROSCOPIC RADICAL NEPHRECTOMY;  Surgeon: Alvaro Ricardo KATHEE Mickey., MD;  Location: [facility] ORS;  Service: Urology;  Laterality: Right;  180 MINUTES NEEDED FOR CASE   TEE WITHOUT CARDIOVERSION N/A 05/11/2016   Procedure: TRANSESOPHAGEAL ECHOCARDIOGRAM (TEE);  Surgeon: Jerel Balding, MD;  Location: [facility] ENDOSCOPY;  Service: Cardiovascular;  Laterality: N/A;   TONSILLECTOMY  1967   TOTAL SHOULDER ARTHROPLASTY Left 06/15/2016   Procedure: TOTAL SHOULDER ARTHROPLASTY;  Surgeon: Eva Herring, MD;  Location: [facility] OR;  Service: Orthopedics;  Laterality: Left;  Left total shoulder arthroplasty   TRANSURETHRAL  RESECTION OF PROSTATE N/A 07/02/2020   Procedure: TRANSURETHRAL RESECTION OF THE PROSTATE (TURP);  Surgeon: Carolee Sherwood JONETTA DOUGLAS, MD;  Location: [facility] ORS;  Service: Urology;  Laterality: N/A;   Patient Active Problem List   Diagnosis Date Noted   Renal mass, right 04/27/2023   Hypercoagulable state due to persistent atrial fibrillation (HCC) 08/02/2022   Parkinson's disease without dyskinesia or fluctuating manifestations (HCC) 12/21/2021   BPH (benign prostatic hyperplasia) 07/02/2020   Myelopathy (HCC) 04/26/2020   Atrial flutter (HCC) 12/19/2016   S/P shoulder replacement, left 06/15/2016   A-fib (HCC) 11/30/2015   HTN (hypertension)    Hypercholesterolemia    Deafness in left ear    Atrial fibrillation (HCC)    Hypopotassemia     ONSET DATE: March 2025 REFERRING DIAG:  G20.A1 (ICD-10-CM) - Parkinson's disease without dyskinesia or fluctuating manifestations (HCC)   THERAPY DIAG:  Unsteadiness on feet  Muscle weakness (generalized)  Other symptoms and signs involving the nervous system  Difficulty in walking, not elsewhere classified  Parkinson's disease without dyskinesia or fluctuating manifestations (HCC)  Other lack of coordination  Rationale for Evaluation and Treatment: Rehabilitation  SUBJECTIVE:                                                                                                                                                                                       SUBJECTIVE STATEMENT: Pt reports no new complaints.   Pt accompanied by: self, wife  PERTINENT HISTORY: Parkinsons Disease, diagnosed November, 2023. History of cervical myelopathy from severe cervical stenosis at C5-C6             -Status post surgical intervention by Dr. Onetha in March, 2022. History of vestibular schwannoma             -Status post surgical intervention/removal many years ago (30+), but MRI in March, 2022 demonstrated 23 x 16 x 14 mm mass, consistent with vestibular  schwannoma - left total shoulder replacement PAIN:  Are you having pain? Yes: NPRS scale: 0/10 Pain location: thoracic spine Pain description: sore Aggravating factors: standing prolonged on hard surfaces Relieving factors: rest  PRECAUTIONS: Fall  WEIGHT BEARING RESTRICTIONS: No  FALLS: Has patient fallen in last 6 months? No  LIVING ENVIRONMENT: Lives with: lives with their spouse Lives in: House/apartment Stairs: a few steps to enter. Deck in back of home w/ HR Has following equipment at home: occasional use of cane  PLOF: Independent with basic ADLs  PATIENT GOALS: improve balance and stamina, get back to where I was a year ago  OBJECTIVE:   TODAY'S TREATMENT: 10/23/23 Activity Comments  Treadmill 1.7 mph x 3 min, 2.0 mph x 2 min, 1.7 mph x 3 min   Row reactive iso 10# x10 Shoulder ext reactive iso 10# x10 Lawnmower 10# 2x10 At Matrix  Against wall: cervical retraction 2x10 Low shoulder horizontal abd 2x10 Shoulder ER 2x10           TODAY'S TREATMENT: 10/18/23 Activity Comments  supine PWR moves: up rock twist step Verbal and manual cueing for proper positioning. Cues for pacing  prone PWR moves: up rock twist step Modified PWR rock, twist, step d/t shoulder limitations. Provided eye boost to maintain extended position   step up + opposite SKTC B UE support; therapist calling out pace to assist with pacing      PATIENT EDUCATION: Education details: discussed HEP- pt reports that he is performing standing PWR moves and started performing with alternating STS, HEP update  Person educated: Patient and Spouse Education method: Explanation, Demonstration, Tactile cues, Verbal cues, and Handouts Education comprehension: verbalized understanding and returned demonstration    HOME EXERCISE PROGRAM Access Code: [email or web address] URL: [email or web address] Date: 10/18/2023 Prepared by: [facility] - Outpatient  Rehab - [facility]  Program  Notes n/a  Exercises - Shoulder External Rotation and Scapular Retraction  - 1-2 x daily - 5 x weekly - 2 sets - 10 reps - Sit to Stand with Arm Swing  - 1-2 x daily - 5 x weekly - 2 sets - 10 reps - Active Straight Leg Raise with Quad Set  - 1-2 x daily - 5 x weekly - 2 sets - 10 reps - Sidelying Hip Abduction  - 1-2 x daily - 5 x weekly - 2 sets - 10 reps - Side Stepping with Resistance at Thighs and Counter Support  - 1 x daily - 7 x weekly - 1-2 sets - 2 min round hold - Wall Squat  - 1 x daily - 5 x weekly - 2 sets - 10 reps - Prone on Elbows Stretch  - 1 x daily - 5 x weekly - 2 sets - 10 reps    PATIENT EDUCATION: Education details: HEP updates Person educated: Patient Education method: Explanation, Demonstration, Tactile cues, Verbal cues, and Handouts Education comprehension: verbalized understanding and returned demonstration   ________________________________________________________________________________________ Note: Objective measures were completed at Evaluation unless otherwise noted.  DIAGNOSTIC FINDINGS:   COGNITION: Overall cognitive status: Within functional limits for tasks assessed   SENSATION: Not tested  COORDINATION: WFL  POSTURE: forward head  LOWER EXTREMITY ROM:    WFL  LOWER EXTREMITY MMT:   Grossly 4/5 to seated resisted tests  BED MOBILITY:  indep  TRANSFERS: independent  CURB:  Findings: independent  STAIRS: Not tested GAIT: Findings: Distance walked:   and Comments: WFL, deficits during turning R vs L  FUNCTIONAL TESTS:  5 times sit to stand: 10 sec 10 meter walk test: 11.84 sec = 2.7 ft/sec  Mini-BESTest: 19/28 3 meter backwards walk test: 8.3 sec    ______________________________________________________________________________________  GOALS: Goals reviewed with patient? Yes  SHORT TERM GOALS: Target date: 09/17/2023   Patient will be independent in HEP to improve functional outcomes Baseline: Goal status: MET  per reports  2.  Demo improved postural stability per mild-mod sway x 30 sec condition 4 M-CTSIB to improve safety with ADL Baseline: mod-severe x 10 sec Goal status: MET-- Can hold x 30 seconds on 09/25/23  LONG TERM GOALS: Target date: 10/08/2023>11/02/23   Patient to ambulate at least 1500 ft during in order to improve functional endurance.  Baseline: 1037 ft 09/06/23> 1110 ft 10/04/2023 Goal status: IN PROGRESS 10/04/2023  2.  Demo improved balance, motor control, and reduced risk for falls per score 24/28 Mini-BESTest Baseline: 19/28>22/28 10/04/2023 Goal status: IN PROGRESS 10/04/2023  3.  Demo reduced risk for falls per time 4.8 sec 3 meter Backwards Walk Test Baseline: 8.3 sec>9.69 sec 10/04/2023 Goal status: IN PROGRESS 10/04/2023  4.  Report improved activity tolerance as evidenced by ability to participate in community activities, e.g. shopping, exercise class, outings with friends, etc Baseline:  10/02/23: Pt reports he's been able to do a lot more -- able to walk around [employer] Goal status: IN PROGRESS  5.  Pt to report improved thoracic pain as to not limiting standing ADL/housekeeping tasks to improve activity tolerance  Baseline: 6/10 thoracic pain with prolonged standing  10/02/23: Still 6/10  Goal status: IN PROGRESS   ASSESSMENT:  CLINICAL IMPRESSION: Worked on postural strengthening this session. Good pt tolerance. Still working on improving backwards stepping -- may benefit from performing this with a metronome for a forced increase in his pace.    OBJECTIVE IMPAIRMENTS: decreased activity tolerance, decreased balance, decreased endurance, decreased knowledge of use of DME, difficulty walking, decreased strength, and pain.   PLAN:  PT FREQUENCY: 1-2x/week  PT DURATION: 4 weeks, per recert 10/04/2023  PLANNED INTERVENTIONS: 97750- Physical Performance Testing, 97110-Therapeutic exercises, 97530- Therapeutic activity, 97112- Neuromuscular re-education, 97535-  Self Care, [id number]- Manual therapy, [id number]- Gait training, [phone]- Orthotic Initial, [id number]- Orthotic/Prosthetic subsequent, [phone]- Canalith repositioning, and [phone]- Aquatic Therapy  PLAN FOR NEXT SESSION:  Continue BLE strengthening-- treadmill training, bike HIIT for home performance, postural training, PWR! Exercises Continue to progress towards LTGs.   Carmelina Balducci April Ma L Erleen Egner, [place], DPT 10/23/23 10:59 AM  [facility] at [facility] [address] [place], [address] [place], [place] [address] Phone # [phone] Fax # [phone]

## 2023-10-25 ENCOUNTER — Ambulatory Visit: Admitting: Physical Therapy

## 2023-10-25 ENCOUNTER — Encounter: Payer: Self-pay | Admitting: Physical Therapy

## 2023-10-25 DIAGNOSIS — R278 Other lack of coordination: Secondary | ICD-10-CM

## 2023-10-25 DIAGNOSIS — R29818 Other symptoms and signs involving the nervous system: Secondary | ICD-10-CM

## 2023-10-25 DIAGNOSIS — G20A1 Parkinson's disease without dyskinesia, without mention of fluctuations: Secondary | ICD-10-CM | POA: Diagnosis not present

## 2023-10-25 DIAGNOSIS — M6281 Muscle weakness (generalized): Secondary | ICD-10-CM

## 2023-10-25 DIAGNOSIS — R2681 Unsteadiness on feet: Secondary | ICD-10-CM

## 2023-10-25 DIAGNOSIS — R262 Difficulty in walking, not elsewhere classified: Secondary | ICD-10-CM

## 2023-10-25 NOTE — Therapy (Signed)
 OUTPATIENT PHYSICAL THERAPY NEURO TREATMENT NOTE   Patient Name: Patrick Gray MRN: [id number] DOB:1955/10/15, 68 y.o., male Today's Date: 10/25/2023  PCP: Seabron Lenis, MD REFERRING PROVIDER: Evonnie Asberry RAMAN, DO  END OF SESSION:  PT End of Session - 10/25/23 1103     Visit Number 17    Number of Visits 19    Date for PT Re-Evaluation 11/02/23    Authorization Type [employer]-reauth requested    Authorization Time Period total of 20 visits approved ffom 08/27/23 to 11/02/23    Authorization - Visit Number 17    Authorization - Number of Visits 20    Progress Note Due on Visit 20    PT Start Time 1103    PT Stop Time 1141    PT Time Calculation (min) 38 min    Equipment Utilized During Treatment Gait belt    Activity Tolerance Patient tolerated treatment well    Behavior During Therapy WFL for tasks assessed/performed             Past Medical History:  Diagnosis Date   Acoustic neuroma (HCC)    left   Anxiety    Arthritis    Complication of anesthesia    Delusion, violent, hallucination, hospitalized 8 days, urine retention   Coronary artery disease    Deafness in left ear    Delusion (HCC)    after fall on pain meds and prednisone   Dysrhythmia    PAF   Foley catheter in place    H/O cardiac radiofrequency ablation    HTN (hypertension)    Hypercholesterolemia    Hypopotassemia    Insomnia    Parkinson's disease (HCC)    Paroxysmal atrial fibrillation (HCC)    Radiculopathy    after fall   Tremor    left hand   Vitamin B 12 deficiency    Past Surgical History:  Procedure Laterality Date   ACOUSTIC NEUROMA RESECTION Left 19990   ANTERIOR CERVICAL DECOMP/DISCECTOMY FUSION N/A 04/26/2020   Procedure: Anterior Cervical Decompression Fusion - Cervical five-Cervical six - Cervical six-Cervical seven;  Surgeon: Onetha Kuba, MD;  Location: [facility] OR;  Service: Neurosurgery;  Laterality: N/A;   APPENDECTOMY  1976   ATRIAL FIBRILLATION ABLATION N/A 12/19/2016    Procedure: ATRIAL FIBRILLATION ABLATION;  Surgeon: Kelsie Agent, MD;  Location: [facility] INVASIVE CV LAB;  Service: Cardiovascular;  Laterality: N/A;   CARDIAC CATHETERIZATION N/A 10/07/2015   Procedure: Left Heart Cath and Coronary Angiography;  Surgeon: Maude JAYSON Emmer, MD;  Location: [facility] INVASIVE CV LAB;  Service: Cardiovascular;  Laterality: N/A;   CARDIOVERSION N/A 06/17/2013   Procedure: CARDIOVERSION;  Surgeon: Maude JAYSON Emmer, MD;  Location: [facility] ENDOSCOPY;  Service: Cardiovascular;  Laterality: N/A;   CARDIOVERSION N/A 05/11/2016   Procedure: CARDIOVERSION;  Surgeon: Jerel Balding, MD;  Location: [facility] ENDOSCOPY;  Service: Cardiovascular;  Laterality: N/A;   CARDIOVERSION N/A 05/24/2017   Procedure: CARDIOVERSION;  Surgeon: Shlomo Wilbert SAUNDERS, MD;  Location: [facility] ENDOSCOPY;  Service: Cardiovascular;  Laterality: N/A;   CARDIOVERSION N/A 11/19/2017   Procedure: CARDIOVERSION;  Surgeon: Mona Vinie JAYSON, MD;  Location: [facility] ENDOSCOPY;  Service: Cardiovascular;  Laterality: N/A;   CHOLECYSTECTOMY  1976   CLUB FOOT RELEASE  1968   ELECTROPHYSIOLOGIC STUDY N/A 11/30/2015   Procedure: Atrial Fibrillation Ablation;  Surgeon: Agent Kelsie, MD;  Location: [facility] INVASIVE CV LAB;  Service: Cardiovascular;  Laterality: N/A;   ROBOT ASSISTED LAPAROSCOPIC NEPHRECTOMY Right 04/27/2023   Procedure: XI RIGHT RETROPERITONEAL ROBOTIC ASSISTED LAPAROSCOPIC RADICAL  NEPHRECTOMY;  Surgeon: Alvaro Ricardo KATHEE Mickey., MD;  Location: [facility] ORS;  Service: Urology;  Laterality: Right;  180 MINUTES NEEDED FOR CASE   TEE WITHOUT CARDIOVERSION N/A 05/11/2016   Procedure: TRANSESOPHAGEAL ECHOCARDIOGRAM (TEE);  Surgeon: Jerel Balding, MD;  Location: [facility] ENDOSCOPY;  Service: Cardiovascular;  Laterality: N/A;   TONSILLECTOMY  1967   TOTAL SHOULDER ARTHROPLASTY Left 06/15/2016   Procedure: TOTAL SHOULDER ARTHROPLASTY;  Surgeon: Eva Herring, MD;  Location: [facility] OR;  Service: Orthopedics;  Laterality: Left;  Left total shoulder arthroplasty   TRANSURETHRAL  RESECTION OF PROSTATE N/A 07/02/2020   Procedure: TRANSURETHRAL RESECTION OF THE PROSTATE (TURP);  Surgeon: Carolee Sherwood JONETTA DOUGLAS, MD;  Location: [facility] ORS;  Service: Urology;  Laterality: N/A;   Patient Active Problem List   Diagnosis Date Noted   Renal mass, right 04/27/2023   Hypercoagulable state due to persistent atrial fibrillation (HCC) 08/02/2022   Parkinson's disease without dyskinesia or fluctuating manifestations (HCC) 12/21/2021   BPH (benign prostatic hyperplasia) 07/02/2020   Myelopathy (HCC) 04/26/2020   Atrial flutter (HCC) 12/19/2016   S/P shoulder replacement, left 06/15/2016   A-fib (HCC) 11/30/2015   HTN (hypertension)    Hypercholesterolemia    Deafness in left ear    Atrial fibrillation (HCC)    Hypopotassemia     ONSET DATE: March 2025 REFERRING DIAG:  G20.A1 (ICD-10-CM) - Parkinson's disease without dyskinesia or fluctuating manifestations (HCC)   THERAPY DIAG:  Unsteadiness on feet  Muscle weakness (generalized)  Other symptoms and signs involving the nervous system  Difficulty in walking, not elsewhere classified  Parkinson's disease without dyskinesia or fluctuating manifestations (HCC)  Other lack of coordination  Rationale for Evaluation and Treatment: Rehabilitation  SUBJECTIVE:                                                                                                                                                                                       SUBJECTIVE STATEMENT: Pt states he is feeling well. Not too sore from last session. Has felt more limited with community activities due to his R knee/orthopedic issues. No pain in midback   Pt accompanied by: self, wife  PERTINENT HISTORY: Parkinsons Disease, diagnosed November, 2023. History of cervical myelopathy from severe cervical stenosis at C5-C6             -Status post surgical intervention by Dr. Onetha in March, 2022. History of vestibular schwannoma             -Status post surgical  intervention/removal many years ago (30+), but MRI in March, 2022 demonstrated 23 x 16 x 14 mm mass, consistent with vestibular schwannoma - left total shoulder replacement PAIN:  Are you having pain? Yes: NPRS scale: 0/10 Pain location: thoracic spine Pain description: sore Aggravating factors: standing prolonged on hard surfaces Relieving factors: rest   PRECAUTIONS: Fall  WEIGHT BEARING RESTRICTIONS: No  FALLS: Has patient fallen in last 6 months? No  LIVING ENVIRONMENT: Lives with: lives with their spouse Lives in: House/apartment Stairs: a few steps to enter. Deck in back of home w/ HR Has following equipment at home: occasional use of cane  PLOF: Independent with basic ADLs  PATIENT GOALS: improve balance and stamina, get back to where I was a year ago  OBJECTIVE:  TODAY'S TREATMENT: 10/25/23 Activity Comments  Treadmill 1.8-2.1 mph x 10 min total for 2 min bouts increase/decrease speed 0.19 miles in 6 min = 1003.2 ft  Row reactive iso 20# 2x10 Shoulder ext reactive iso 20# x10 Lawnmower 15# 2x10 Resisted backwards walking 20# x10 Resisted forwards walking 20# x10 At Matrix  Amb with alternating UE/LE no a/d Coordination gets off at ~50'  Staggered rocking fwd/bwd with step through and alternating arm pattern throwing scarf in air x10         PATIENT EDUCATION: Education details: HEP updates Person educated: Patient and Spouse Education method: Explanation, Demonstration, Tactile cues, Verbal cues, and Handouts Education comprehension: verbalized understanding and returned demonstration    HOME EXERCISE PROGRAM Access Code: [email or web address] URL: [email or web address] Date: 10/18/2023 Prepared by: [facility] - Outpatient  Rehab - [facility]  Program Notes n/a  Exercises - Shoulder External Rotation and Scapular Retraction  - 1-2 x daily - 5 x weekly - 2 sets - 10 reps - Sit to Stand with Arm Swing  - 1-2 x daily - 5 x weekly - 2 sets - 10  reps - Active Straight Leg Raise with Quad Set  - 1-2 x daily - 5 x weekly - 2 sets - 10 reps - Sidelying Hip Abduction  - 1-2 x daily - 5 x weekly - 2 sets - 10 reps - Side Stepping with Resistance at Thighs and Counter Support  - 1 x daily - 7 x weekly - 1-2 sets - 2 min round hold - Wall Squat  - 1 x daily - 5 x weekly - 2 sets - 10 reps - Prone on Elbows Stretch  - 1 x daily - 5 x weekly - 2 sets - 10 reps    PATIENT EDUCATION: Education details: HEP updates Person educated: Patient Education method: Explanation, Demonstration, Tactile cues, Verbal cues, and Handouts Education comprehension: verbalized understanding and returned demonstration   ________________________________________________________________________________________ Note: Objective measures were completed at Evaluation unless otherwise noted.  DIAGNOSTIC FINDINGS:   COGNITION: Overall cognitive status: Within functional limits for tasks assessed   SENSATION: Not tested  COORDINATION: WFL  POSTURE: forward head  LOWER EXTREMITY ROM:    WFL  LOWER EXTREMITY MMT:   Grossly 4/5 to seated resisted tests  BED MOBILITY:  indep  TRANSFERS: independent  CURB:  Findings: independent  STAIRS: Not tested GAIT: Findings: Distance walked:   and Comments: WFL, deficits during turning R vs L  FUNCTIONAL TESTS:  5 times sit to stand: 10 sec 10 meter walk test: 11.84 sec = 2.7 ft/sec  Mini-BESTest: 19/28 3 meter backwards walk test: 8.3 sec    ______________________________________________________________________________________  GOALS: Goals reviewed with patient? Yes  SHORT TERM GOALS: Target date: 09/17/2023   Patient will be independent in HEP to improve functional outcomes Baseline: Goal status: MET per reports  2.  Demo improved postural stability per mild-mod sway  x 30 sec condition 4 M-CTSIB to improve safety with ADL Baseline: mod-severe x 10 sec Goal status: MET-- Can hold x 30  seconds on 09/25/23  LONG TERM GOALS: Target date: 10/08/2023>11/02/23   Patient to ambulate at least 1500 ft during in order to improve functional endurance.  Baseline: 1037 ft 09/06/23> 1110 ft 10/04/2023 Goal status: IN PROGRESS 10/04/2023  2.  Demo improved balance, motor control, and reduced risk for falls per score 24/28 Mini-BESTest Baseline: 19/28>22/28 10/04/2023 Goal status: IN PROGRESS 10/04/2023  3.  Demo reduced risk for falls per time 4.8 sec 3 meter Backwards Walk Test Baseline: 8.3 sec>9.69 sec 10/04/2023 Goal status: IN PROGRESS 10/04/2023  4.  Report improved activity tolerance as evidenced by ability to participate in community activities, e.g. shopping, exercise class, outings with friends, etc Baseline:  10/02/23: Pt reports he's been able to do a lot more -- able to walk around [employer] Goal status: IN PROGRESS  5.  Pt to report improved thoracic pain as to not limiting standing ADL/housekeeping tasks to improve activity tolerance  Baseline: 6/10 thoracic pain with prolonged standing  10/02/23: Still 6/10  Goal status: IN PROGRESS   ASSESSMENT:  CLINICAL IMPRESSION: Treatment focused on continuing to improve postural strengthening, backwards stepping and dynamic balance, and improving UE/LE coordination. Pt has difficulty with L arm swing during gait. Endurance is overall improving.    OBJECTIVE IMPAIRMENTS: decreased activity tolerance, decreased balance, decreased endurance, decreased knowledge of use of DME, difficulty walking, decreased strength, and pain.   PLAN:  PT FREQUENCY: 1-2x/week  PT DURATION: 4 weeks, per recert 10/04/2023  PLANNED INTERVENTIONS: 97750- Physical Performance Testing, 97110-Therapeutic exercises, 97530- Therapeutic activity, 97112- Neuromuscular re-education, 97535- Self Care, [id number]- Manual therapy, [id number]- Gait training, [phone]- Orthotic Initial, [id number]- Orthotic/Prosthetic subsequent, [phone]- Canalith repositioning, and [phone]- Aquatic  Therapy  PLAN FOR NEXT SESSION:  Continue BLE strengthening-- treadmill training, bike HIIT for home performance, postural training, PWR! Exercises Continue to progress towards LTGs.   Marlise Fahr April Ma L Shanoah Asbill, PT, DPT 10/25/23 11:46 AM  [facility] at [facility] [address] [place], [address] [place], [place] [address] Phone # [phone] Fax # [phone]

## 2023-10-29 DIAGNOSIS — D1771 Benign lipomatous neoplasm of kidney: Secondary | ICD-10-CM | POA: Diagnosis not present

## 2023-10-29 DIAGNOSIS — C61 Malignant neoplasm of prostate: Secondary | ICD-10-CM | POA: Diagnosis not present

## 2023-10-30 ENCOUNTER — Ambulatory Visit: Admitting: Physical Therapy

## 2023-10-30 ENCOUNTER — Encounter: Payer: Self-pay | Admitting: Physical Therapy

## 2023-10-30 DIAGNOSIS — R278 Other lack of coordination: Secondary | ICD-10-CM

## 2023-10-30 DIAGNOSIS — G20A1 Parkinson's disease without dyskinesia, without mention of fluctuations: Secondary | ICD-10-CM | POA: Diagnosis not present

## 2023-10-30 DIAGNOSIS — R2681 Unsteadiness on feet: Secondary | ICD-10-CM

## 2023-10-30 DIAGNOSIS — R29818 Other symptoms and signs involving the nervous system: Secondary | ICD-10-CM | POA: Diagnosis not present

## 2023-10-30 DIAGNOSIS — R262 Difficulty in walking, not elsewhere classified: Secondary | ICD-10-CM

## 2023-10-30 DIAGNOSIS — M6281 Muscle weakness (generalized): Secondary | ICD-10-CM

## 2023-10-30 NOTE — Therapy (Signed)
 OUTPATIENT PHYSICAL THERAPY NEURO TREATMENT NOTE   Patient Name: Patrick Gray MRN: [id number] DOB:1955/10/05, 68 y.o., male Today's Date: 10/30/2023  PCP: Seabron Lenis, MD REFERRING PROVIDER: Evonnie Asberry RAMAN, DO  END OF SESSION:  PT End of Session - 10/30/23 1102     Visit Number 18    Number of Visits 19    Date for PT Re-Evaluation 11/02/23    Authorization Type [employer]-reauth requested    Authorization Time Period total of 20 visits approved ffom 08/27/23 to 11/02/23    Authorization - Visit Number 18    Authorization - Number of Visits 20    Progress Note Due on Visit 20    PT Start Time 1102    PT Stop Time 1140    PT Time Calculation (min) 38 min    Equipment Utilized During Treatment Gait belt    Activity Tolerance Patient tolerated treatment well    Behavior During Therapy WFL for tasks assessed/performed              Past Medical History:  Diagnosis Date   Acoustic neuroma (HCC)    left   Anxiety    Arthritis    Complication of anesthesia    Delusion, violent, hallucination, hospitalized 8 days, urine retention   Coronary artery disease    Deafness in left ear    Delusion (HCC)    after fall on pain meds and prednisone   Dysrhythmia    PAF   Foley catheter in place    H/O cardiac radiofrequency ablation    HTN (hypertension)    Hypercholesterolemia    Hypopotassemia    Insomnia    Parkinson's disease (HCC)    Paroxysmal atrial fibrillation (HCC)    Radiculopathy    after fall   Tremor    left hand   Vitamin B 12 deficiency    Past Surgical History:  Procedure Laterality Date   ACOUSTIC NEUROMA RESECTION Left 19990   ANTERIOR CERVICAL DECOMP/DISCECTOMY FUSION N/A 04/26/2020   Procedure: Anterior Cervical Decompression Fusion - Cervical five-Cervical six - Cervical six-Cervical seven;  Surgeon: Onetha Kuba, MD;  Location: [facility] OR;  Service: Neurosurgery;  Laterality: N/A;   APPENDECTOMY  1976   ATRIAL FIBRILLATION ABLATION N/A 12/19/2016    Procedure: ATRIAL FIBRILLATION ABLATION;  Surgeon: Kelsie Agent, MD;  Location: [facility] INVASIVE CV LAB;  Service: Cardiovascular;  Laterality: N/A;   CARDIAC CATHETERIZATION N/A 10/07/2015   Procedure: Left Heart Cath and Coronary Angiography;  Surgeon: Maude JAYSON Emmer, MD;  Location: [facility] INVASIVE CV LAB;  Service: Cardiovascular;  Laterality: N/A;   CARDIOVERSION N/A 06/17/2013   Procedure: CARDIOVERSION;  Surgeon: Maude JAYSON Emmer, MD;  Location: [facility] ENDOSCOPY;  Service: Cardiovascular;  Laterality: N/A;   CARDIOVERSION N/A 05/11/2016   Procedure: CARDIOVERSION;  Surgeon: Jerel Balding, MD;  Location: [facility] ENDOSCOPY;  Service: Cardiovascular;  Laterality: N/A;   CARDIOVERSION N/A 05/24/2017   Procedure: CARDIOVERSION;  Surgeon: Shlomo Wilbert SAUNDERS, MD;  Location: [facility] ENDOSCOPY;  Service: Cardiovascular;  Laterality: N/A;   CARDIOVERSION N/A 11/19/2017   Procedure: CARDIOVERSION;  Surgeon: Mona Vinie JAYSON, MD;  Location: [facility] ENDOSCOPY;  Service: Cardiovascular;  Laterality: N/A;   CHOLECYSTECTOMY  1976   CLUB FOOT RELEASE  1968   ELECTROPHYSIOLOGIC STUDY N/A 11/30/2015   Procedure: Atrial Fibrillation Ablation;  Surgeon: Agent Kelsie, MD;  Location: [facility] INVASIVE CV LAB;  Service: Cardiovascular;  Laterality: N/A;   ROBOT ASSISTED LAPAROSCOPIC NEPHRECTOMY Right 04/27/2023   Procedure: XI RIGHT RETROPERITONEAL ROBOTIC ASSISTED LAPAROSCOPIC  RADICAL NEPHRECTOMY;  Surgeon: Alvaro Ricardo KATHEE Mickey., MD;  Location: [facility] ORS;  Service: Urology;  Laterality: Right;  180 MINUTES NEEDED FOR CASE   TEE WITHOUT CARDIOVERSION N/A 05/11/2016   Procedure: TRANSESOPHAGEAL ECHOCARDIOGRAM (TEE);  Surgeon: Jerel Balding, MD;  Location: [facility] ENDOSCOPY;  Service: Cardiovascular;  Laterality: N/A;   TONSILLECTOMY  1967   TOTAL SHOULDER ARTHROPLASTY Left 06/15/2016   Procedure: TOTAL SHOULDER ARTHROPLASTY;  Surgeon: Eva Herring, MD;  Location: [facility] OR;  Service: Orthopedics;  Laterality: Left;  Left total shoulder arthroplasty   TRANSURETHRAL  RESECTION OF PROSTATE N/A 07/02/2020   Procedure: TRANSURETHRAL RESECTION OF THE PROSTATE (TURP);  Surgeon: Carolee Sherwood JONETTA DOUGLAS, MD;  Location: [facility] ORS;  Service: Urology;  Laterality: N/A;   Patient Active Problem List   Diagnosis Date Noted   Renal mass, right 04/27/2023   Hypercoagulable state due to persistent atrial fibrillation (HCC) 08/02/2022   Parkinson's disease without dyskinesia or fluctuating manifestations (HCC) 12/21/2021   BPH (benign prostatic hyperplasia) 07/02/2020   Myelopathy (HCC) 04/26/2020   Atrial flutter (HCC) 12/19/2016   S/P shoulder replacement, left 06/15/2016   A-fib (HCC) 11/30/2015   HTN (hypertension)    Hypercholesterolemia    Deafness in left ear    Atrial fibrillation (HCC)    Hypopotassemia     ONSET DATE: March 2025 REFERRING DIAG:  G20.A1 (ICD-10-CM) - Parkinson's disease without dyskinesia or fluctuating manifestations (HCC)   THERAPY DIAG:  Unsteadiness on feet  Muscle weakness (generalized)  Other symptoms and signs involving the nervous system  Difficulty in walking, not elsewhere classified  Parkinson's disease without dyskinesia or fluctuating manifestations (HCC)  Other lack of coordination  Rationale for Evaluation and Treatment: Rehabilitation  SUBJECTIVE:                                                                                                                                                                                       SUBJECTIVE STATEMENT: Pt reports nothing new or different.   Pt accompanied by: self, wife  PERTINENT HISTORY: Parkinsons Disease, diagnosed November, 2023. History of cervical myelopathy from severe cervical stenosis at C5-C6             -Status post surgical intervention by Dr. Onetha in March, 2022. History of vestibular schwannoma             -Status post surgical intervention/removal many years ago (30+), but MRI in March, 2022 demonstrated 23 x 16 x 14 mm mass, consistent with vestibular  schwannoma - left total shoulder replacement PAIN:  Are you having pain? Yes: NPRS scale: 0/10 Pain location: thoracic spine Pain description: sore Aggravating factors: standing prolonged on hard  surfaces Relieving factors: rest   PRECAUTIONS: Fall  WEIGHT BEARING RESTRICTIONS: No  FALLS: Has patient fallen in last 6 months? No  LIVING ENVIRONMENT: Lives with: lives with their spouse Lives in: House/apartment Stairs: a few steps to enter. Deck in back of home w/ HR Has following equipment at home: occasional use of cane  PLOF: Independent with basic ADLs  PATIENT GOALS: improve balance and stamina, get back to where I was a year ago  OBJECTIVE:  TODAY'S TREATMENT: 10/25/23 Activity Comments  Standing PWR! Moves prepare x 10 each   6 MWT 1236' -- limited by knee pain and burning in L upper trap 3/10 RPE, 94% spO2, 72 BPM  PWR! Step back 2x10   Bwd walking 10 meters x 3 laps   Bwd step tap to target with metronome Side step tap to targets with metronome beat 65 BPM metronome working on improving L LE coordination and speed  Reviewed goals with pt            PATIENT EDUCATION: Education details: HEP updates Person educated: Patient and Spouse Education method: Explanation, Demonstration, Tactile cues, Verbal cues, and Handouts Education comprehension: verbalized understanding and returned demonstration    HOME EXERCISE PROGRAM Access Code: [email or web address] URL: [email or web address] Date: 10/18/2023 Prepared by: [facility] - Outpatient  Rehab - [facility]  Program Notes n/a  Exercises - Shoulder External Rotation and Scapular Retraction  - 1-2 x daily - 5 x weekly - 2 sets - 10 reps - Sit to Stand with Arm Swing  - 1-2 x daily - 5 x weekly - 2 sets - 10 reps - Active Straight Leg Raise with Quad Set  - 1-2 x daily - 5 x weekly - 2 sets - 10 reps - Sidelying Hip Abduction  - 1-2 x daily - 5 x weekly - 2 sets - 10 reps - Side Stepping with  Resistance at Thighs and Counter Support  - 1 x daily - 7 x weekly - 1-2 sets - 2 min round hold - Wall Squat  - 1 x daily - 5 x weekly - 2 sets - 10 reps - Prone on Elbows Stretch  - 1 x daily - 5 x weekly - 2 sets - 10 reps    PATIENT EDUCATION: Education details: HEP updates Person educated: Patient Education method: Explanation, Demonstration, Tactile cues, Verbal cues, and Handouts Education comprehension: verbalized understanding and returned demonstration   ________________________________________________________________________________________ Note: Objective measures were completed at Evaluation unless otherwise noted.  DIAGNOSTIC FINDINGS:   COGNITION: Overall cognitive status: Within functional limits for tasks assessed   SENSATION: Not tested  COORDINATION: WFL  POSTURE: forward head  LOWER EXTREMITY ROM:    WFL  LOWER EXTREMITY MMT:   Grossly 4/5 to seated resisted tests  BED MOBILITY:  indep  TRANSFERS: independent  CURB:  Findings: independent  STAIRS: Not tested GAIT: Findings: Distance walked:   and Comments: WFL, deficits during turning R vs L  FUNCTIONAL TESTS:  5 times sit to stand: 10 sec 10 meter walk test: 11.84 sec = 2.7 ft/sec  Mini-BESTest: 19/28 3 meter backwards walk test: 8.3 sec    ______________________________________________________________________________________  GOALS: Goals reviewed with patient? Yes  SHORT TERM GOALS: Target date: 09/17/2023   Patient will be independent in HEP to improve functional outcomes Baseline: Goal status: MET per reports  2.  Demo improved postural stability per mild-mod sway x 30 sec condition 4 M-CTSIB to improve safety with ADL Baseline: mod-severe x 10 sec Goal status:  MET-- Can hold x 30 seconds on 09/25/23  LONG TERM GOALS: Target date: 10/08/2023>11/02/23   Patient to ambulate at least 1500 ft during in order to improve functional endurance.  Baseline: 1037 ft 09/06/23> 1110  ft 10/04/2023> 1236 ft 10/30/23 Goal status: IN PROGRESS   2.  Demo improved balance, motor control, and reduced risk for falls per score 24/28 Mini-BESTest Baseline: 19/28>22/28 10/04/2023>  Goal status: IN PROGRESS 10/04/2023  3.  Demo reduced risk for falls per time 4.8 sec 3 meter Backwards Walk Test Baseline: 8.3 sec>9.69 sec 10/04/2023> 14.6 sec 10/30/23 Goal status: IN PROGRESS   4.  Report improved activity tolerance as evidenced by ability to participate in community activities, e.g. shopping, exercise class, outings with friends, etc Baseline:  10/02/23: Pt reports he's been able to do a lot more -- able to walk around [employer] Goal status: IN PROGRESS  5.  Pt to report improved thoracic pain as to not limiting standing ADL/housekeeping tasks to improve activity tolerance  Baseline: 6/10 thoracic pain with prolonged standing  10/02/23: Still 6/10  Goal status: IN PROGRESS   ASSESSMENT:  CLINICAL IMPRESSION: Focused on rechecking some of pt's walking goals today. Walking outdoors, pt is able to make ~1236 feet but on treadmill, pt is able to meet LTG #1. Notes that he gets limited with ambulating outdoors due to increased knee pain and L UT burning which dissipates with seated rest. In regards to backwards walking, pt is still not yet to goal level. Worked on improving L LE foot coordination/speed with metronome to improve his agility with backwards walking. Will finish rechecking goals and plan for d/c next session.    OBJECTIVE IMPAIRMENTS: decreased activity tolerance, decreased balance, decreased endurance, decreased knowledge of use of DME, difficulty walking, decreased strength, and pain.   PLAN:  PT FREQUENCY: 1-2x/week  PT DURATION: 4 weeks, per recert 10/04/2023  PLANNED INTERVENTIONS: 97750- Physical Performance Testing, 97110-Therapeutic exercises, 97530- Therapeutic activity, 97112- Neuromuscular re-education, 97535- Self Care, 02859- Manual therapy, [id number]- Gait training,  [phone]- Orthotic Initial, [id number]- Orthotic/Prosthetic subsequent, [phone]- Canalith repositioning, and [phone]- Aquatic Therapy  PLAN FOR NEXT SESSION:  Continue BLE strengthening-- treadmill training, bike HIIT for home performance, postural training, PWR! Exercises Continue to progress towards LTGs.   Casin Federici April Ma L Tayleigh Wetherell, Sylva, DPT 10/30/23 11:03 AM  [facility] at [facility] [address] [place], [address] [place], [place] [address] Phone # [phone] Fax # [phone]

## 2023-11-01 ENCOUNTER — Ambulatory Visit: Admitting: Physical Therapy

## 2023-11-01 ENCOUNTER — Encounter: Payer: Self-pay | Admitting: Physical Therapy

## 2023-11-01 DIAGNOSIS — R262 Difficulty in walking, not elsewhere classified: Secondary | ICD-10-CM

## 2023-11-01 DIAGNOSIS — G20A1 Parkinson's disease without dyskinesia, without mention of fluctuations: Secondary | ICD-10-CM

## 2023-11-01 DIAGNOSIS — M6281 Muscle weakness (generalized): Secondary | ICD-10-CM | POA: Diagnosis not present

## 2023-11-01 DIAGNOSIS — R278 Other lack of coordination: Secondary | ICD-10-CM | POA: Diagnosis not present

## 2023-11-01 DIAGNOSIS — R29818 Other symptoms and signs involving the nervous system: Secondary | ICD-10-CM | POA: Diagnosis not present

## 2023-11-01 DIAGNOSIS — R2681 Unsteadiness on feet: Secondary | ICD-10-CM

## 2023-11-01 NOTE — Therapy (Signed)
 OUTPATIENT PHYSICAL THERAPY NEURO TREATMENT NOTE AND DISCHARGE   Patient Name: Patrick Gray MRN: [id number] DOB:October 04, 1955, 68 y.o., male Today's Date: 11/01/2023  PCP: Seabron Lenis, MD REFERRING PROVIDER: Evonnie Asberry RAMAN, DO  END OF SESSION:  PT End of Session - 11/01/23 1104     Visit Number 19    Number of Visits 19    Date for PT Re-Evaluation 11/02/23    Authorization Type [employer]-reauth requested    Authorization Time Period total of 20 visits approved ffom 08/27/23 to 11/02/23    Authorization - Visit Number 19    Authorization - Number of Visits 20    Progress Note Due on Visit 20    PT Start Time 1101    PT Stop Time 1140    PT Time Calculation (min) 39 min    Equipment Utilized During Treatment Gait belt    Activity Tolerance Patient tolerated treatment well    Behavior During Therapy WFL for tasks assessed/performed              Past Medical History:  Diagnosis Date   Acoustic neuroma (HCC)    left   Anxiety    Arthritis    Complication of anesthesia    Delusion, violent, hallucination, hospitalized 8 days, urine retention   Coronary artery disease    Deafness in left ear    Delusion (HCC)    after fall on pain meds and prednisone   Dysrhythmia    PAF   Foley catheter in place    H/O cardiac radiofrequency ablation    HTN (hypertension)    Hypercholesterolemia    Hypopotassemia    Insomnia    Parkinson's disease (HCC)    Paroxysmal atrial fibrillation (HCC)    Radiculopathy    after fall   Tremor    left hand   Vitamin B 12 deficiency    Past Surgical History:  Procedure Laterality Date   ACOUSTIC NEUROMA RESECTION Left 19990   ANTERIOR CERVICAL DECOMP/DISCECTOMY FUSION N/A 04/26/2020   Procedure: Anterior Cervical Decompression Fusion - Cervical five-Cervical six - Cervical six-Cervical seven;  Surgeon: Onetha Kuba, MD;  Location: [facility] OR;  Service: Neurosurgery;  Laterality: N/A;   APPENDECTOMY  1976   ATRIAL FIBRILLATION ABLATION  N/A 12/19/2016   Procedure: ATRIAL FIBRILLATION ABLATION;  Surgeon: Kelsie Agent, MD;  Location: [facility] INVASIVE CV LAB;  Service: Cardiovascular;  Laterality: N/A;   CARDIAC CATHETERIZATION N/A 10/07/2015   Procedure: Left Heart Cath and Coronary Angiography;  Surgeon: Maude JAYSON Emmer, MD;  Location: [facility] INVASIVE CV LAB;  Service: Cardiovascular;  Laterality: N/A;   CARDIOVERSION N/A 06/17/2013   Procedure: CARDIOVERSION;  Surgeon: Maude JAYSON Emmer, MD;  Location: [facility] ENDOSCOPY;  Service: Cardiovascular;  Laterality: N/A;   CARDIOVERSION N/A 05/11/2016   Procedure: CARDIOVERSION;  Surgeon: Jerel Balding, MD;  Location: [facility] ENDOSCOPY;  Service: Cardiovascular;  Laterality: N/A;   CARDIOVERSION N/A 05/24/2017   Procedure: CARDIOVERSION;  Surgeon: Shlomo Wilbert SAUNDERS, MD;  Location: [facility] ENDOSCOPY;  Service: Cardiovascular;  Laterality: N/A;   CARDIOVERSION N/A 11/19/2017   Procedure: CARDIOVERSION;  Surgeon: Mona Vinie JAYSON, MD;  Location: [facility] ENDOSCOPY;  Service: Cardiovascular;  Laterality: N/A;   CHOLECYSTECTOMY  1976   CLUB FOOT RELEASE  1968   ELECTROPHYSIOLOGIC STUDY N/A 11/30/2015   Procedure: Atrial Fibrillation Ablation;  Surgeon: Agent Kelsie, MD;  Location: [facility] INVASIVE CV LAB;  Service: Cardiovascular;  Laterality: N/A;   ROBOT ASSISTED LAPAROSCOPIC NEPHRECTOMY Right 04/27/2023   Procedure: XI RIGHT RETROPERITONEAL ROBOTIC  ASSISTED LAPAROSCOPIC RADICAL NEPHRECTOMY;  Surgeon: Alvaro Ricardo KATHEE Mickey., MD;  Location: [facility] ORS;  Service: Urology;  Laterality: Right;  180 MINUTES NEEDED FOR CASE   TEE WITHOUT CARDIOVERSION N/A 05/11/2016   Procedure: TRANSESOPHAGEAL ECHOCARDIOGRAM (TEE);  Surgeon: Jerel Balding, MD;  Location: [facility] ENDOSCOPY;  Service: Cardiovascular;  Laterality: N/A;   TONSILLECTOMY  1967   TOTAL SHOULDER ARTHROPLASTY Left 06/15/2016   Procedure: TOTAL SHOULDER ARTHROPLASTY;  Surgeon: Eva Herring, MD;  Location: [facility] OR;  Service: Orthopedics;  Laterality: Left;  Left total shoulder arthroplasty    TRANSURETHRAL RESECTION OF PROSTATE N/A 07/02/2020   Procedure: TRANSURETHRAL RESECTION OF THE PROSTATE (TURP);  Surgeon: Carolee Sherwood JONETTA DOUGLAS, MD;  Location: [facility] ORS;  Service: Urology;  Laterality: N/A;   Patient Active Problem List   Diagnosis Date Noted   Renal mass, right 04/27/2023   Hypercoagulable state due to persistent atrial fibrillation (HCC) 08/02/2022   Parkinson's disease without dyskinesia or fluctuating manifestations (HCC) 12/21/2021   BPH (benign prostatic hyperplasia) 07/02/2020   Myelopathy (HCC) 04/26/2020   Atrial flutter (HCC) 12/19/2016   S/P shoulder replacement, left 06/15/2016   A-fib (HCC) 11/30/2015   HTN (hypertension)    Hypercholesterolemia    Deafness in left ear    Atrial fibrillation (HCC)    Hypopotassemia     ONSET DATE: March 2025 REFERRING DIAG:  G20.A1 (ICD-10-CM) - Parkinson's disease without dyskinesia or fluctuating manifestations (HCC)   THERAPY DIAG:  Unsteadiness on feet  Muscle weakness (generalized)  Other symptoms and signs involving the nervous system  Difficulty in walking, not elsewhere classified  Parkinson's disease without dyskinesia or fluctuating manifestations (HCC)  Other lack of coordination  Rationale for Evaluation and Treatment: Rehabilitation  SUBJECTIVE:                                                                                                                                                                                       SUBJECTIVE STATEMENT: Wife states the shuffling has been getting better. Pt reports he is able to get around the store and community for the most part without issues. Still feels he gets off balance with walking.   Pt accompanied by: self, wife  PERTINENT HISTORY: Parkinsons Disease, diagnosed November, 2023. History of cervical myelopathy from severe cervical stenosis at C5-C6             -Status post surgical intervention by Dr. Onetha in March, 2022. History of vestibular  schwannoma             -Status post surgical intervention/removal many years ago (30+), but MRI in March, 2022 demonstrated 23 x 16 x 14 mm mass, consistent with  vestibular schwannoma - left total shoulder replacement PAIN:  Are you having pain? Yes: NPRS scale: 0/10 Pain location: thoracic spine Pain description: sore Aggravating factors: standing prolonged on hard surfaces Relieving factors: rest   PRECAUTIONS: Fall  WEIGHT BEARING RESTRICTIONS: No  FALLS: Has patient fallen in last 6 months? No  LIVING ENVIRONMENT: Lives with: lives with their spouse Lives in: House/apartment Stairs: a few steps to enter. Deck in back of home w/ HR Has following equipment at home: occasional use of cane  PLOF: Independent with basic ADLs  PATIENT GOALS: improve balance and stamina, get back to where I was a year ago  OBJECTIVE:  TODAY'S TREATMENT: 11/01/23 Activity Comments  Treadmill 6 min 1.8 mph 0.18 miles  MiniBESTEST: 25  See below  Sit<>stand x10 with 8#, x 5 focusing on form   Deadlift modified x10 with 8#   Row reactive isometrics green TB x10 Shoulder ext reactive isometrics green TB x10   Reviewed goals with pt           [facility] PT Assessment - 11/01/23 0001       Mini-BESTest   Sit To Stand Normal: Comes to stand without use of hands and stabilizes independently.    Rise to Toes Normal: Stable for 3 s with maximum height.    Stand on one leg (left) Moderate: < 20 s   8 sec   Stand on one leg (right) Moderate: < 20 s   11 sec   Stand on one leg - lowest score 1    Compensatory Stepping Correction - Forward Normal: Recovers independently with a single, large step (second realignement is allowed).    Compensatory Stepping Correction - Backward Moderate: More than one step is required to recover equilibrium    Compensatory Stepping Correction - Left Lateral Normal: Recovers independently with 1 step (crossover or lateral OK)    Compensatory Stepping Correction - Right  Lateral Normal: Recovers independently with 1 step (crossover or lateral OK)    Stepping Corredtion Lateral - lowest score 2    Stance - Feet together, eyes open, firm surface  Normal: 30s    Stance - Feet together, eyes closed, foam surface  Normal: 30s    Incline - Eyes Closed Normal: Stands independently 30s and aligns with gravity    Change in Gait Speed Normal: Significantly changes walkling speed without imbalance    Walk with head turns - Horizontal Normal: performs head turns with no change in gait speed and good balance    Walk with pivot turns Normal: Turns with feet close FAST (< 3 steps) with good balance.    Step over obstacles Normal: Able to step over box with minimal change of gait speed and with good balance.    Timed UP & GO with Dual Task Moderate: Dual Task affects either counting OR walking (>10%) when compared to the TUG without Dual Task.   10.81 sec normal TUG, 16.43 sec dual task TUG   Mini-BEST total score 25           TODAY'S TREATMENT: 10/25/23 Activity Comments  Standing PWR! Moves prepare x 10 each   6 MWT 1236' -- limited by knee pain and burning in L upper trap 3/10 RPE, 94% spO2, 72 BPM  PWR! Step back 2x10   Bwd walking 10 meters x 3 laps   Bwd step tap to target with metronome Side step tap to targets with metronome beat 65 BPM metronome working on improving L LE coordination and  speed  Reviewed goals with pt            PATIENT EDUCATION: Education details: HEP updates Person educated: Patient and Spouse Education method: Explanation, Demonstration, Tactile cues, Verbal cues, and Handouts Education comprehension: verbalized understanding and returned demonstration    HOME EXERCISE PROGRAM Access Code: [email or web address] URL: [email or web address] Date: 10/18/2023 Prepared by: [facility]  Re[facility] - [facility]  Program Notes n/a  Exercises - Shoulder External Rotation and Scapular Retraction  - 1-2 x daily - 5 x  weekly - 2 sets - 10 reps - Sit to Stand with Arm Swing  - 1-2 x daily - 5 x weekly - 2 sets - 10 reps - Active Straight Leg Raise with Quad Set  - 1-2 x daily - 5 x weekly - 2 sets - 10 reps - Sidelying Hip Abduction  - 1-2 x daily - 5 x weekly - 2 sets - 10 reps - Side Stepping with Resistance at Thighs and Counter Support  - 1 x daily - 7 x weekly - 1-2 sets - 2 min round hold - Wall Squat  - 1 x daily - 5 x weekly - 2 sets - 10 reps - Prone on Elbows Stretch  - 1 x daily - 5 x weekly - 2 sets - 10 reps    PATIENT EDUCATION: Education details: HEP updates Person educated: Patient Education method: Explanation, Demonstration, Tactile cues, Verbal cues, and Handouts Education comprehension: verbalized understanding and returned demonstration   ________________________________________________________________________________________ Note: Objective measures were completed at Evaluation unless otherwise noted.  DIAGNOSTIC FINDINGS:   COGNITION: Overall cognitive status: Within functional limits for tasks assessed   SENSATION: Not tested  COORDINATION: WFL  POSTURE: forward head  LOWER EXTREMITY ROM:    WFL  LOWER EXTREMITY MMT:   Grossly 4/5 to seated resisted tests  BED MOBILITY:  indep  TRANSFERS: independent  CURB:  Findings: independent  STAIRS: Not tested GAIT: Findings: Distance walked:   and Comments: WFL, deficits during turning R vs L  FUNCTIONAL TESTS:  5 times sit to stand: 10 sec 10 meter walk test: 11.84 sec = 2.7 ft/sec  Mini-BESTest: 19/28 3 meter backwards walk test: 8.3 sec    ______________________________________________________________________________________  GOALS: Goals reviewed with patient? Yes  SHORT TERM GOALS: Target date: 09/17/2023   Patient will be independent in HEP to improve functional outcomes Baseline: Goal status: MET per reports  2.  Demo improved postural stability per mild-mod sway x 30 sec condition 4 M-CTSIB  to improve safety with ADL Baseline: mod-severe x 10 sec Goal status: MET-- Can hold x 30 seconds on 09/25/23  LONG TERM GOALS: Target date: 10/08/2023>11/02/23   Patient to ambulate at least 1500 ft during in order to improve functional endurance.  Baseline: 1037 ft 09/06/23> 1110 ft 10/04/2023> 1236 ft 10/30/23 Goal status: PARTIALLY MET  2.  Demo improved balance, motor control, and reduced risk for falls per score 24/28 Mini-BESTest Baseline: 19/28>22/28 10/04/2023>  25/28 11/01/23 Goal status: MET 11/01/23  3.  Demo reduced risk for falls per time 4.8 sec 3 meter Backwards Walk Test Baseline: 8.3 sec>9.69 sec 10/04/2023> 14.6 sec 10/30/23 Goal status: NOT MET  4.  Report improved activity tolerance as evidenced by ability to participate in community activities, e.g. shopping, exercise class, outings with friends, etc Baseline:  10/02/23: Pt reports he's been able to do a lot more -- able to walk around [employer] 11/01/23: Reports no issues with participating with outtings Goal status: MET  5.  Pt to report improved thoracic pain as to not limiting standing ADL/housekeeping tasks to improve activity tolerance  Baseline: 6/10 thoracic pain with prolonged standing  10/02/23: Still 6/10  11/01/23: Reports it hasn't been bad in the past few weeks and not limiting him  Goal status: MET   ASSESSMENT:  CLINICAL IMPRESSION: Mr. Lynton has met or partially met 4 out of his 5 long term goals. Greatest difficulty is still with backwards walking/weight shift and posterior chain strength. At this point, pt feels good to continue to work on exercises independently at home. Updated pt's HEP accordingly.    OBJECTIVE IMPAIRMENTS: decreased activity tolerance, decreased balance, decreased endurance, decreased knowledge of use of DME, difficulty walking, decreased strength, and pain.   PLAN:  PT FREQUENCY: 1-2x/week  PT DURATION: 4 weeks, per recert 10/04/2023  PLANNED INTERVENTIONS: 97750- Physical  Performance Testing, 97110-Therapeutic exercises, 97530- Therapeutic activity, 97112- Neuromuscular re-education, 97535- Self Care, 02859- Manual therapy, [id number]- Gait training, [phone]- Orthotic Initial, [id number]- Orthotic/Prosthetic subsequent, [phone]- Canalith repositioning, and [phone]- Aquatic Therapy  PLAN FOR NEXT SESSION:  Continue BLE strengthening-- treadmill training, bike HIIT for home performance, postural training, PWR! Exercises Continue to progress towards LTGs.   Jael Waldorf April Ma L [place], [place], DPT 11/01/23 1:14 PM  [facility] at [facility] [address] [place], [address] [place], [place] [address] Phone # [phone] Fax # [phone]  PHYSICAL THERAPY DISCHARGE SUMMARY  Visits from Start of Care: 84  Current functional level related to goals / functional outcomes: See above   Remaining deficits: See above   Education / Equipment: See final HEP   Patient agrees to discharge. Patient goals were partially met. Patient is being discharged due to being pleased with the current functional level.

## 2023-11-05 DIAGNOSIS — K429 Umbilical hernia without obstruction or gangrene: Secondary | ICD-10-CM | POA: Diagnosis not present

## 2023-11-05 DIAGNOSIS — Z905 Acquired absence of kidney: Secondary | ICD-10-CM | POA: Diagnosis not present

## 2023-11-05 DIAGNOSIS — Z8546 Personal history of malignant neoplasm of prostate: Secondary | ICD-10-CM | POA: Diagnosis not present

## 2023-11-05 DIAGNOSIS — C61 Malignant neoplasm of prostate: Secondary | ICD-10-CM | POA: Diagnosis not present

## 2023-11-05 DIAGNOSIS — N281 Cyst of kidney, acquired: Secondary | ICD-10-CM | POA: Diagnosis not present

## 2023-11-19 DIAGNOSIS — L821 Other seborrheic keratosis: Secondary | ICD-10-CM | POA: Diagnosis not present

## 2023-11-19 DIAGNOSIS — D2272 Melanocytic nevi of left lower limb, including hip: Secondary | ICD-10-CM | POA: Diagnosis not present

## 2023-11-19 DIAGNOSIS — Z872 Personal history of diseases of the skin and subcutaneous tissue: Secondary | ICD-10-CM | POA: Diagnosis not present

## 2023-11-19 DIAGNOSIS — D2262 Melanocytic nevi of left upper limb, including shoulder: Secondary | ICD-10-CM | POA: Diagnosis not present

## 2023-11-19 DIAGNOSIS — D2261 Melanocytic nevi of right upper limb, including shoulder: Secondary | ICD-10-CM | POA: Diagnosis not present

## 2023-11-19 DIAGNOSIS — D2271 Melanocytic nevi of right lower limb, including hip: Secondary | ICD-10-CM | POA: Diagnosis not present

## 2023-11-19 DIAGNOSIS — C61 Malignant neoplasm of prostate: Secondary | ICD-10-CM | POA: Diagnosis not present

## 2023-11-19 DIAGNOSIS — Z905 Acquired absence of kidney: Secondary | ICD-10-CM | POA: Diagnosis not present

## 2023-11-19 DIAGNOSIS — L570 Actinic keratosis: Secondary | ICD-10-CM | POA: Diagnosis not present

## 2023-11-19 DIAGNOSIS — D225 Melanocytic nevi of trunk: Secondary | ICD-10-CM | POA: Diagnosis not present

## 2023-11-19 DIAGNOSIS — R338 Other retention of urine: Secondary | ICD-10-CM | POA: Diagnosis not present

## 2023-11-19 DIAGNOSIS — Z09 Encounter for follow-up examination after completed treatment for conditions other than malignant neoplasm: Secondary | ICD-10-CM | POA: Diagnosis not present

## 2023-11-19 DIAGNOSIS — D1771 Benign lipomatous neoplasm of kidney: Secondary | ICD-10-CM | POA: Diagnosis not present

## 2023-12-31 DIAGNOSIS — I10 Essential (primary) hypertension: Secondary | ICD-10-CM | POA: Diagnosis not present

## 2023-12-31 DIAGNOSIS — G4700 Insomnia, unspecified: Secondary | ICD-10-CM | POA: Diagnosis not present

## 2023-12-31 DIAGNOSIS — G20A1 Parkinson's disease without dyskinesia, without mention of fluctuations: Secondary | ICD-10-CM | POA: Diagnosis not present

## 2023-12-31 DIAGNOSIS — F411 Generalized anxiety disorder: Secondary | ICD-10-CM | POA: Diagnosis not present

## 2023-12-31 DIAGNOSIS — M85851 Other specified disorders of bone density and structure, right thigh: Secondary | ICD-10-CM | POA: Diagnosis not present

## 2023-12-31 DIAGNOSIS — I714 Abdominal aortic aneurysm, without rupture, unspecified: Secondary | ICD-10-CM | POA: Diagnosis not present

## 2023-12-31 DIAGNOSIS — E538 Deficiency of other specified B group vitamins: Secondary | ICD-10-CM | POA: Diagnosis not present

## 2023-12-31 DIAGNOSIS — C61 Malignant neoplasm of prostate: Secondary | ICD-10-CM | POA: Diagnosis not present

## 2023-12-31 DIAGNOSIS — E7800 Pure hypercholesterolemia, unspecified: Secondary | ICD-10-CM | POA: Diagnosis not present

## 2023-12-31 DIAGNOSIS — I4891 Unspecified atrial fibrillation: Secondary | ICD-10-CM | POA: Diagnosis not present

## 2024-01-21 ENCOUNTER — Other Ambulatory Visit: Payer: Self-pay | Admitting: Cardiology

## 2024-01-21 ENCOUNTER — Other Ambulatory Visit: Payer: Self-pay | Admitting: Neurology

## 2024-01-21 DIAGNOSIS — I4819 Other persistent atrial fibrillation: Secondary | ICD-10-CM

## 2024-01-21 DIAGNOSIS — G20A1 Parkinson's disease without dyskinesia, without mention of fluctuations: Secondary | ICD-10-CM

## 2024-01-24 NOTE — Progress Notes (Unsigned)
 Assessment/Plan:   1.   Parkinsons Disease, diagnosed November, 2023             -continue  carbidopa /levodopa  25/100, 1.5 tablet 3 times per day.  -continue carbidopa /levodopa  50/200 at bed  2.  B12 deficiency             -On oral supplement   3.  History of cervical myelopathy from severe cervical stenosis at C5-C6             -Status post surgical intervention by Dr. Onetha in March, 2022.   4.  History of vestibular schwannoma             -Status post surgical intervention/removal many years ago (30+), but MRI in March, 2022 demonstrated 23 x 16 x 14 mm mass, consistent with vestibular schwannoma   5.  A-fib             -on eliquis    6.  Alcohol use with transaminasemia             -he is now limiting alcohol to no more than 2 days/week.    7.  Renal mass  - Status post removal of benign angiomyolipoma in March, 2025  8.  Insomnia  -on trazodone    9.  L ptosis  -stable but will check AchR ab  10.  Hx of acoustic neuroma  - MRI stable in February, 2025 with known 22 mm x 12 mm x 11 mm left vestibular schwannoma  11.  Anxiety  - improved now but was severe when going through #7.  This is far out of my area of expertise and other physicians are currently managing.  His primary care has been working with him to get an appointment with behavioral health but patient reports he is actually doing much better and doesn't plan to follow through with this.  No SI/HI now (did have SI at one time)  Subjective:   Patrick Gray was seen today in follow up for Parkinsons disease.  Pt with wife who supplements hx.  My previous records were reviewed prior to todays visit as well as outside records available to me.  Patient has been to physical therapy since last visit.  Notes are reviewed.  Primary care notes indicate that the patient is going to be transferring care to Dr. Charlyne ***.  Current prescribed movement disorder medications: Carbidopa /levodopa  25/100, 1.5 tablet 3 times per  day carbidopa /levodopa  50/200 at bed B12 supplement   ALLERGIES:   Allergies  Allergen Reactions   Clonazepam     Serotin syndrome   Hydralazine      Serotonin syndrome   Prednisolone Other (See Comments)    Insomnia    Sertraline Other (See Comments)   Penicillins Rash and Other (See Comments)         CURRENT MEDICATIONS:  No outpatient medications have been marked as taking for the 01/29/24 encounter (Appointment) with Mercedes Fort, Asberry RAMAN, DO.     Objective:   PHYSICAL EXAMINATION:    VITALS:   There were no vitals filed for this visit.     GEN:  The patient appears stated age and is in NAD. HEENT:  Normocephalic, atraumatic.  The mucous membranes are moist. The superficial temporal arteries are without ropiness or tenderness. CV:  RRR Lungs:  CTAB Neck/HEME:  There are no carotid bruits bilaterally.   Neurological examination:   Orientation: The patient is alert and oriented x3.  Cranial nerves: There is decreased palpebral fissure on  the L c/t R but otherwise good facial symmetry (stable).  Extraocular muscles are intact. The visual fields are full to confrontational testing. The speech is fluent and clear. Soft palate rises symmetrically and there is no tongue deviation. Hearing is intact to conversational tone but decreased on the left (ever since removal of acoustic neuroma). Sensation: Sensation is intact to light touch throughout  Motor: Strength is at least antigravity x 4    Movement examination: Tone: There is nl tone in the UE/LE Abnormal movements: there is LUE rest tremor, mod (stable).  There is no postural tremor or intention tremor.  There is re-emergent tremor on the L Coordination:  There is no decremation only with alternating supination and pronation of the forearm and toe taps on the L.  All other RAMs are normal Gait and Station: The patient pushes off to arise.  He drags the L leg and there is marked decrease arm swing on the L.  He is slightly  off balance.  I have reviewed and interpreted the following labs independently    Chemistry      Component Value Date/Time   NA 136 06/18/2023 1329   NA 142 03/18/2019 1210   K 4.6 06/18/2023 1329   CL 102 06/18/2023 1329   CO2 23 06/18/2023 1329   BUN 28 (H) 06/18/2023 1329   BUN 16 03/18/2019 1210   CREATININE 1.12 06/18/2023 1329   CREATININE 0.87 11/15/2015 1009      Component Value Date/Time   CALCIUM  10.6 (H) 06/18/2023 1329   ALKPHOS 83 06/18/2023 1329   AST 53 (H) 06/18/2023 1329   ALT 39 06/18/2023 1329   BILITOT 0.7 06/18/2023 1329       Lab Results  Component Value Date   WBC 4.5 06/18/2023   HGB 15.3 06/18/2023   HCT 43.3 06/18/2023   MCV 90.4 06/18/2023   PLT 147 (L) 06/18/2023    Lab Results  Component Value Date   TSH 1.610 06/18/2023   Total time spent on today's visit was *** minutes, including both face-to-face time and nonface-to-face time.  Time included that spent on review of records (prior notes available to me/labs/imaging if pertinent), discussing treatment and goals, answering patient's questions and coordinating care.    Cc:  Seabron Lenis, MD

## 2024-01-24 NOTE — Telephone Encounter (Signed)
 Patient has enough to get to appointment on 12/09

## 2024-01-25 NOTE — Telephone Encounter (Signed)
 Eliquis  5mg  refill request received. Patient is 68 years old, weight-93.2kg, Crea-1.12 on 06/18/23, Diagnosis-Afib, and last seen by Damien Braver on 04/24/23 via Tele Visit. Dose is appropriate based on dosing criteria. Will send in refill to requested pharmacy.

## 2024-01-28 ENCOUNTER — Encounter: Payer: Self-pay | Admitting: Neurology

## 2024-01-28 ENCOUNTER — Telehealth: Admitting: Neurology

## 2024-01-28 DIAGNOSIS — M2550 Pain in unspecified joint: Secondary | ICD-10-CM | POA: Diagnosis not present

## 2024-01-28 DIAGNOSIS — G8929 Other chronic pain: Secondary | ICD-10-CM

## 2024-01-28 DIAGNOSIS — G20A1 Parkinson's disease without dyskinesia, without mention of fluctuations: Secondary | ICD-10-CM

## 2024-01-28 DIAGNOSIS — M79606 Pain in leg, unspecified: Secondary | ICD-10-CM | POA: Diagnosis not present

## 2024-01-28 DIAGNOSIS — M549 Dorsalgia, unspecified: Secondary | ICD-10-CM | POA: Diagnosis not present

## 2024-01-28 MED ORDER — CARBIDOPA-LEVODOPA 25-100 MG PO TABS: 1.5000 | ORAL_TABLET | Freq: Three times a day (TID) | ORAL | 1 refills | Status: AC

## 2024-01-28 NOTE — Progress Notes (Signed)
 Assessment/Plan:   1.   Parkinsons Disease, diagnosed November, 2023             -continue  carbidopa /levodopa  25/100, 1.5 tablet 3 times per day.  Refilled today  -continue carbidopa /levodopa  50/200 at bed  -exercise discussed  2.  B12 deficiency             -On oral supplement   3.  History of cervical myelopathy from severe cervical stenosis at C5-C6             -Status post surgical intervention by Dr. Onetha in March, 2022.   4.  History of vestibular schwannoma             -Status post surgical intervention/removal many years ago (30+), but MRI in March, 2022 demonstrated 23 x 16 x 14 mm mass, consistent with vestibular schwannoma   5.  A-fib             -on eliquis    6.  Alcohol use with transaminasemia             -he is now limiting alcohol to no more than 2 days/week.    7.  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  F/u appts were good news but patient frustrated with inability to take nsaids for pain  8.  Insomnia  -on trazodone    9.  L ptosis  -stable but will check AchR ab  10.  Hx of acoustic neuroma  - MRI stable in February, 2025 with known 22 mm x 12 mm x 11 mm left vestibular schwannoma  11.  Leg/back/orthopedic joint pain  -f/u pcp to see what meds can be taken  Subjective:   Patrick Gray was seen today in follow up for Parkinsons disease.   My previous records were reviewed prior to todays visit as well as outside records available to me.  Patient has been to physical therapy since last visit.  Notes are reviewed.  Primary care notes indicate that the patient is going to be transferring care to Dr. Charlyne but pt states that he has decided to maintain care here for now.  Having leg pain and can't take anything because of prior kidney surgery.  Tylenol  without benefit.  He is exercising on the stationary bike, 30 min per day.  He has had no falls.  No hallucinations.    Current prescribed movement disorder  medications: Carbidopa /levodopa  25/100, 1.5 tablet 3 times per day carbidopa /levodopa  50/200 at bed B12 supplement   ALLERGIES:   Allergies  Allergen Reactions   Clonazepam     Serotin syndrome   Hydralazine      Serotonin syndrome   Prednisolone Other (See Comments)    Insomnia    Sertraline Other (See Comments)   Penicillins Rash and Other (See Comments)         CURRENT MEDICATIONS:  No outpatient medications have been marked as taking for the 01/28/24 encounter (Appointment) with Patrick Gray, Patrick RAMAN, DO.     Objective:   PHYSICAL EXAMINATION:    VITALS:   There were no vitals filed for this visit.  GEN:  The patient appears stated age and is in NAD. HEENT:  Normocephalic, atraumatic.     Neurological examination:   Orientation: The patient is alert and oriented x3.  Cranial nerves: There is decreased palpebral fissure on the L c/t R but otherwise good facial symmetry (stable).  Extraocular muscles are intact. The speech is fluent and  clear. Soft palate rises symmetrically and there is no tongue deviation. Hearing is intact to conversational tone but decreased on the left (ever since removal of acoustic neuroma). Motor: Strength is at least antigravity x 4    Movement examination: Abnormal movements: there is LUE rest tremor, mod (stable).  There is no postural tremor or intention tremor.  There is re-emergent tremor on the L Coordination:  There is no decremation in the UE bilaterally Gait and Station: The patient pushes off to arise.  He slightly drags the L leg but ambulates pretty well in the home within the confines seen in the video  I have reviewed and interpreted the following labs independently    Chemistry      Component Value Date/Time   NA 136 06/18/2023 1329   NA 142 03/18/2019 1210   K 4.6 06/18/2023 1329   CL 102 06/18/2023 1329   CO2 23 06/18/2023 1329   BUN 28 (H) 06/18/2023 1329   BUN 16 03/18/2019 1210   CREATININE 1.12 06/18/2023 1329    CREATININE 0.87 11/15/2015 1009      Component Value Date/Time   CALCIUM  10.6 (H) 06/18/2023 1329   ALKPHOS 83 06/18/2023 1329   AST 53 (H) 06/18/2023 1329   ALT 39 06/18/2023 1329   BILITOT 0.7 06/18/2023 1329       Lab Results  Component Value Date   WBC 4.5 06/18/2023   HGB 15.3 06/18/2023   HCT 43.3 06/18/2023   MCV 90.4 06/18/2023   PLT 147 (L) 06/18/2023    Lab Results  Component Value Date   TSH 1.610 06/18/2023   Follow up Instructions      -I discussed the assessment and treatment plan with the patient. The patient was provided an opportunity to ask questions and all were answered. The patient agreed with the plan and demonstrated an understanding of the instructions.   The patient was advised to call back or seek an in-person evaluation if the symptoms worsen or if the condition fails to improve as anticipated.     Patrick Schneider, DO    Cc:  Patrick Lenis, MD

## 2024-01-29 ENCOUNTER — Telehealth: Admitting: Neurology

## 2024-03-01 ENCOUNTER — Other Ambulatory Visit: Payer: Self-pay | Admitting: Cardiology

## 2024-05-26 ENCOUNTER — Ambulatory Visit: Admitting: Cardiology

## 2024-06-10 ENCOUNTER — Ambulatory Visit: Attending: Neurology | Admitting: Physical Therapy

## 2024-07-31 ENCOUNTER — Ambulatory Visit: Admitting: Neurology
